# Patient Record
Sex: Female | Born: 1960 | Hispanic: No | Marital: Single | Smoking: Former smoker
Health system: Southern US, Community
[De-identification: ages and names within clinical notes are randomized; demographics above are authoritative.]

## PROBLEM LIST (undated history)

## (undated) DIAGNOSIS — E785 Hyperlipidemia, unspecified: Secondary | ICD-10-CM

## (undated) DIAGNOSIS — J019 Acute sinusitis, unspecified: Secondary | ICD-10-CM

## (undated) DIAGNOSIS — M797 Fibromyalgia: Secondary | ICD-10-CM

## (undated) DIAGNOSIS — E663 Overweight: Secondary | ICD-10-CM

## (undated) DIAGNOSIS — Z8489 Family history of other specified conditions: Secondary | ICD-10-CM

## (undated) DIAGNOSIS — Z8719 Personal history of other diseases of the digestive system: Secondary | ICD-10-CM

## (undated) DIAGNOSIS — A048 Other specified bacterial intestinal infections: Secondary | ICD-10-CM

## (undated) DIAGNOSIS — K219 Gastro-esophageal reflux disease without esophagitis: Secondary | ICD-10-CM

## (undated) DIAGNOSIS — IMO0001 Reserved for inherently not codable concepts without codable children: Secondary | ICD-10-CM

## (undated) DIAGNOSIS — R002 Palpitations: Secondary | ICD-10-CM

## (undated) DIAGNOSIS — E162 Hypoglycemia, unspecified: Secondary | ICD-10-CM

## (undated) DIAGNOSIS — I499 Cardiac arrhythmia, unspecified: Secondary | ICD-10-CM

## (undated) DIAGNOSIS — I729 Aneurysm of unspecified site: Secondary | ICD-10-CM

## (undated) DIAGNOSIS — I10 Essential (primary) hypertension: Secondary | ICD-10-CM

## (undated) DIAGNOSIS — N189 Chronic kidney disease, unspecified: Secondary | ICD-10-CM

## (undated) DIAGNOSIS — D259 Leiomyoma of uterus, unspecified: Secondary | ICD-10-CM

## (undated) DIAGNOSIS — R51 Headache: Secondary | ICD-10-CM

## (undated) DIAGNOSIS — J45909 Unspecified asthma, uncomplicated: Secondary | ICD-10-CM

## (undated) HISTORY — PX: COLONOSCOPY: SHX174

## (undated) HISTORY — DX: Palpitations: R00.2

## (undated) HISTORY — DX: Hypoglycemia, unspecified: E16.2

## (undated) HISTORY — DX: Hyperlipidemia, unspecified: E78.5

## (undated) HISTORY — DX: Fibromyalgia: M79.7

## (undated) HISTORY — PX: LIPOMA EXCISION: SHX5283

## (undated) HISTORY — DX: Overweight: E66.3

## (undated) HISTORY — DX: Essential (primary) hypertension: I10

---
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
## 2006-06-25 ENCOUNTER — Ambulatory Visit: Payer: Self-pay | Admitting: Cardiology

## 2008-05-22 ENCOUNTER — Encounter: Payer: Self-pay | Admitting: Cardiology

## 2008-06-13 ENCOUNTER — Ambulatory Visit: Payer: Self-pay | Admitting: Cardiology

## 2008-06-13 ENCOUNTER — Encounter: Payer: Self-pay | Admitting: Physician Assistant

## 2008-07-25 ENCOUNTER — Encounter: Payer: Self-pay | Admitting: Cardiology

## 2008-07-27 ENCOUNTER — Ambulatory Visit: Payer: Self-pay | Admitting: Cardiology

## 2009-01-05 DIAGNOSIS — E785 Hyperlipidemia, unspecified: Secondary | ICD-10-CM | POA: Insufficient documentation

## 2009-01-05 DIAGNOSIS — R002 Palpitations: Secondary | ICD-10-CM | POA: Insufficient documentation

## 2009-01-05 DIAGNOSIS — R079 Chest pain, unspecified: Secondary | ICD-10-CM | POA: Insufficient documentation

## 2009-01-05 DIAGNOSIS — I10 Essential (primary) hypertension: Secondary | ICD-10-CM | POA: Insufficient documentation

## 2009-01-05 DIAGNOSIS — E663 Overweight: Secondary | ICD-10-CM | POA: Insufficient documentation

## 2009-06-19 ENCOUNTER — Ambulatory Visit: Payer: Self-pay | Admitting: Cardiology

## 2009-06-19 ENCOUNTER — Encounter: Payer: Self-pay | Admitting: Cardiology

## 2009-06-20 ENCOUNTER — Encounter: Payer: Self-pay | Admitting: Cardiology

## 2009-06-23 ENCOUNTER — Encounter: Payer: Self-pay | Admitting: Cardiology

## 2009-06-28 ENCOUNTER — Encounter: Payer: Self-pay | Admitting: Cardiology

## 2009-06-30 ENCOUNTER — Encounter (INDEPENDENT_AMBULATORY_CARE_PROVIDER_SITE_OTHER): Payer: Self-pay | Admitting: *Deleted

## 2009-07-11 ENCOUNTER — Ambulatory Visit: Payer: Self-pay | Admitting: Cardiology

## 2009-07-28 ENCOUNTER — Encounter (INDEPENDENT_AMBULATORY_CARE_PROVIDER_SITE_OTHER): Payer: Self-pay | Admitting: *Deleted

## 2009-08-03 ENCOUNTER — Encounter: Payer: Self-pay | Admitting: Cardiology

## 2010-05-29 NOTE — Letter (Signed)
Summary: Engineer, materials at [facility]  [address]   [place], [place] [address]   Phone: [phone]  Fax: [phone]        June 30, 2009 MRN: [id number]   Quanika HART-LOWE [address], [place]  [address]   Dear [name],  Your test ordered by Selena Batten has been reviewed by your physician (or physician assistant) and was found to be normal or stable. Your physician (or physician assistant) felt no changes were needed at this time.  ____ Echocardiogram  __X__ Cardiac Stress Test  ____ Lab Work  ____ Peripheral vascular study of arms, legs or neck  ____ CT scan or X-ray  ____ Lung or Breathing test  ____ Other:   Thank you.   Hoover Brunette, LPN    Duane Boston, M.D., F.A.C.C. Thressa Sheller, M.D., F.A.C.C. Oneal Grout, M.D., F.A.C.C. Cheree Ditto, M.D., F.A.C.C. Daiva Nakayama, M.D., F.A.C.C. Kenney Houseman, M.D., F.A.C.C. Jeanne Ivan, PA-C

## 2010-05-29 NOTE — Procedures (Signed)
Summary: Holter and Event/ [facility]  Holter and Event/ [facility]   Imported By: Dorise Hiss 07/10/2009 14:31:12  _____________________________________________________________________  External Attachment:    Type:   Image     Comment:   External Document

## 2010-05-29 NOTE — Letter (Signed)
Summary: [facility] D/C DR. DHRUV VYAS  [facility] D[facility]C DR. DHRUV VYAS   Imported By: Zachary George 07/11/2009 14:01:16  _____________________________________________________________________  External Attachment:    Type:   Image     Comment:   External Document

## 2010-05-29 NOTE — Letter (Signed)
Summary: Generic Engineer, agricultural at [facility] [address]   [place], [place] [address]   Phone: [phone]  Fax: [phone]        July 28, 2009 MRN: [id number]    [name] [name] [address], [place]  [address]    Dear Ms. [name],   We ordered lab work to check your kidney function and electrolytes to be done around March 22nd. It does not appear this has been done yet. Please, take the enclosed order to the [facility] to have this lab work done at your earliest convenience.   If you had your primary MD complete this lab work or do not plan to have it done, please notify our office so that we can properly document this in your chart.     Sincerely,  Cyril Loosen, RN, BSN  This letter has been electronically signed by your physician.

## 2010-05-29 NOTE — Consult Note (Signed)
Summary: CARDIOLOGY CONSULT/ [facility]  CARDIOLOGY CONSULT/ [facility]   Imported By: Zachary George 07/11/2009 13:56:48  _____________________________________________________________________  External Attachment:    Type:   Image     Comment:   External Document

## 2010-05-29 NOTE — Assessment & Plan Note (Signed)
Summary: eph-[facility] dsch 2/22   Visit Type:  hospital follow-up Primary Provider:  Sherril Croon  CC:  hospital follow-up visit.  History of Present Illness: the patient is a 50 year old female is hospitalized with left arm numbness and transient associated left-sided facial numbness and subsequent atypical left thoracic discomfort.  The patient underwent a Myoview study as an outpatient study within normal limits there was no scar or ischemia.  Ejection fraction 79%.  The patient also had a 2-D echo showed normal LV function with some diastolic dysfunction.  The patient also reports headaches usually around her periods.  She denies any recurrent substernal chest pain.  She has no chills of breath orthopnea PND.  She does have some edema on exam.  Preventive Screening-Counseling & Management  Alcohol-Tobacco     Smoking Status: quit     Year Quit: 2005  Current Medications (verified): 1)  Metoprolol Tartrate 50 Mg Tabs (Metoprolol Tartrate) .... Take 1 Tablet By Mouth Two Times A Day 2)  Dilt-Cd 180 Mg Xr24h-Cap (Diltiazem Hcl Coated Beads) .... Take 1 Tablet By Mouth Once A Day 3)  Protonix 40 Mg Tbec (Pantoprazole Sodium) .... Take 1 Tablet By Mouth Once A Day 4)  Hydromet 5-1.5 Mg/70ml Syrp (Hydrocodone-Homatropine) .... Take 1 Tablet By Mouth Three Times A Day As Needed 5)  Spironolactone-Hctz 25-25 Mg Tabs (Spironolactone-Hctz) .... Take 1 Tablet By Mouth Once A Day  Allergies (verified): 1)  ! Ace Inhibitors 2)  ! * Z-Pack  Comments:  Nurse/Medical Assistant: The patient's medications and allergies were reviewed with the patient and were updated in the Medication and Allergy Lists. List reviewed.  Past History:  Past Medical History: Last updated: 01/05/2009 OVERWEIGHT/OBESITY (ICD-278.02) HYPERTENSION, UNSPECIFIED (ICD-401.9) HYPERLIPIDEMIA-MIXED (ICD-272.4) PALPITATIONS (ICD-785.1) CHEST PAIN-UNSPECIFIED (ICD-786.50) Hypoglycemia  Family History: Last updated:  01/05/2009 Family History of Coronary Artery Disease:  Family History of Hypertension:   Social History: Last updated: 01/05/2009 Full Time Single  Tobacco Use - Former.  Alcohol Use - no Drug Use - no  Risk Factors: Smoking Status: quit (07/11/2009)  Review of Systems       The patient complains of headache.  The patient denies fatigue, malaise, fever, weight gain/loss, vision loss, decreased hearing, hoarseness, chest pain, palpitations, shortness of breath, prolonged cough, wheezing, sleep apnea, coughing up blood, abdominal pain, blood in stool, nausea, vomiting, diarrhea, heartburn, incontinence, blood in urine, muscle weakness, joint pain, leg swelling, rash, skin lesions, fainting, dizziness, depression, anxiety, enlarged lymph nodes, easy bruising or bleeding, and environmental allergies.    Vital Signs:  Patient profile:   50 year old female Height:      62 inches Weight:      209 pounds BMI:     38.36 Pulse rate:   73 / minute BP sitting:   114 / 76  (left arm) Cuff size:   large  Vitals Entered By: Carlye Grippe (July 11, 2009 2:31 PM)  Nutrition Counseling: Patient's BMI is greater than 25 and therefore counseled on weight management options. CC: hospital follow-up visit   Physical Exam  Additional Exam:  General: Well-developed, well-nourished in no distress head: Normocephalic and atraumatic eyes PERRLA/EOMI intact, conjunctiva and lids normal nose: No deformity or lesions mouth normal dentition, normal posterior pharynx neck: Supple, no JVD.  No masses, thyromegaly or abnormal cervical nodes lungs: Normal breath sounds bilaterally without wheezing.  Normal percussion heart: regular rate and rhythm with normal S1 and S2, no S3 or S4.  PMI is normal.  No pathological murmurs abdomen:  Normal bowel sounds, abdomen is soft and nontender without masses, organomegaly or hernias noted.  No hepatosplenomegaly musculoskeletal: Back normal, normal gait muscle  strength and tone normal pulsus: Pulse is normal in all 4 extremities Extremities: No peripheral pitting edema neurologic: Alert and oriented x 3 skin: Intact without lesions or rashes cervical nodes: No significant adenopathy psychologic: Normal affect    Impression & Recommendations:  Problem # 1:  HYPERTENSION, UNSPECIFIED (ICD-401.9)  switch the patient's hydrochlorothiazide to Aldactazide 25+25 mg p.o. daily.  We have discontinued potassium supplements.  The following medications were removed from the medication list:    Hydrochlorothiazide 25 Mg Tabs (Hydrochlorothiazide) .[name]... Take 1 tablet by mouth once a day Her updated medication list for this problem includes:    Metoprolol Tartrate 50 Mg Tabs (Metoprolol tartrate) .[name]... Take 1 tablet by mouth two times a day    Dilt-cd 180 Mg Xr24h-cap (Diltiazem hcl coated beads) .[name]... Take 1 tablet by mouth once a day    Spironolactone-hctz 25-25 Mg Tabs (Spironolactone-hctz) .[name]... Take 1 tablet by mouth once a day  Problem # 2:  HYPERLIPIDEMIA-MIXED (ICD-272.4) continue risk factor modification.  I gave the patient dietary recommendations.  Problem # 3:  CHEST PAIN-UNSPECIFIED (ICD-786.50) negative workup for ischemia.  Cardiolite study was within the limits. Her updated medication list for this problem includes:    Metoprolol Tartrate 50 Mg Tabs (Metoprolol tartrate) .[name]... Take 1 tablet by mouth two times a day    Dilt-cd 180 Mg Xr24h-cap (Diltiazem hcl coated beads) .[name]... Take 1 tablet by mouth once a day  Other Orders: Future Orders: T-Basic Metabolic Panel [id number]) ... 07/18/2009  Patient Instructions: 1)  Start Aldactazide 25/25mg  daily 2)  Stop K-Dur 3)  Stop HCTZ 4)  Labs in one week. 5)  Follow up in  1 year Prescriptions: SPIRONOLACTONE-HCTZ 25-25 MG TABS (SPIRONOLACTONE-HCTZ) Take 1 tablet by mouth once a day  #30 x 6   Entered by:   Hoover Brunette, LPN   Authorized by:   Lewayne Bunting, MD, [facility]   Signed by:    Hoover Brunette, LPN on 91/47/8295   Method used:   Electronically to        [facility]  [address] (retail)       [address]       [place], [place]  [address]       Ph: [phone]       Fax: [phone]   RxID:   [id number]   Handout requested.

## 2010-08-06 ENCOUNTER — Other Ambulatory Visit: Payer: Self-pay | Admitting: Cardiology

## 2010-08-31 ENCOUNTER — Other Ambulatory Visit: Payer: Self-pay | Admitting: Cardiology

## 2010-09-11 NOTE — Assessment & Plan Note (Signed)
[facility]                          [facility] OFF[facility] NOTE   [name] [name], DUTY                MRN:          [id number]  DATE:07/27/2008                            DOB:          Dec 31, 1960    PRIMARY CARDIOLOGIST:  Learta Codding, MD, [facility]   REASON FOR VISIT:  Scheduled followup.   Please refer to my recent office note of June 13, 2008, for complete  details.   At that time, the patient presented for evaluation of palpitations and I  referred her for CardioNet monitoring.  This was completed and reviewed  by me, revealing normal sinus rhythm with occasional sinus tachycardia  with rates as high as 120 bpm.  The patient herself reports 3 discrete  episodes of palpitations, all occurring during the middle of the night,  and with an associated systolic blood pressure of approximately 150.  In  each of the 3 cases, she took half a tablet of her metoprolol/HCTZ, with  resolution of her palpitations in 30-60 minutes.  The results of the  monitor were reviewed with the patient.   In general, [name] suggests that she has been plagued with this  problem of palpitations over the last year or so.  She denies any undue  stress.  As previously noted, a TSH level in January of this year was  normal.  Potassium at that time was 3.3.   CURRENT MEDICATIONS:  1. Diltiazem CD 120 daily.  2. Metoprolol and HCTZ 100/25 mg daily.  3. KCl 10 daily.  4. Protonix.   PHYSICAL EXAMINATION:  VITAL SIGNS:  Blood pressure 123/81, pulse 84 and  regular, and weight 213.  GENERAL:  A 50 year old female, moderately obese, sitting upright, no  distress.  HEENT:  Normocephalic and atraumatic.  NECK:  Palpable carotid pulse without bruits; no JVD.  LUNGS:  Clear to auscultation in all fields.  HEART:  Regular rate and rhythm.  No significant murmurs.  ABDOMEN:  Protuberant, nontender.  EXTREMITIES:  No pedal edema.  A hard, raised nodular lesion on  the  medial aspect of the right foot.  No ecchymosis.  NEUROLOGIC:  No focal deficit.   IMPRESSION:  1. Tachy palpitations.      a.     Documented sinus tachycardia by CardioNet monitoring.      b.     History of normal left ventricular function.  2. History of atypical chest pain.      a.     Negative dobutamine stress echocardiogram, February 2008.  3. Hypertension.  4. Dyslipidemia.  5. Hypoglycemia.  6. Obesity.   PLAN:  1. Complete metabolic profile for reassessment of potassium level.  2. Recommendation is to dissociate the metoprolol from the current      combination medication, so as to better manage her palpitations      with a pure beta-blocker.  When she completes her current supply of      metoprolol/HCTZ, I will start her on metoprolol 100 mg q.a.m. and      50 mg q.p.m., the latter dose to provide some coverage during the  early morning hours.  She is to otherwise continue on      hydrochlorothiazide 25 mg daily.  3. Schedule return clinic followup with myself and Dr. Andee Lineman in 3      months.      Gene Serpe, PA-C  Electronically Signed      Learta Codding, MD,FACC  Electronically Signed   GS/MedQ  DD: 07/27/2008  DT: 07/28/2008  Job #: [id number]   cc:   [name] Beam, MD

## 2010-09-11 NOTE — Assessment & Plan Note (Signed)
[facility]                          [facility] NOTE   Laikyn, Gewirtz [name] [name]                MRN:          [id number]  DATE:06/13/2008                            DOB:          29-Apr-1961    PRIMARY CARDIOLOGIST:  Learta Codding, MD, [facility]   REASON FOR VISIT:  [name] [name] is a very pleasant 50 year old female, whom  we saw once before in consultation here at [facility] in February 2008.  At that time, she was referred to [place] for evaluation of chest pain, which  we felt to be quite atypical and, in fact, secondary to a  musculoskeletal etiology.  Given that she presented with several cardiac  risk factors, however, we did refer her for an ischemic workup.  She  underwent a stress echocardiogram, reviewed by Dr. Andee Lineman, which was  essentially normal.   The patient is now referred to [place] for evaluation of recent development  of palpitations.  These began approximately a little over a year ago.  They occur perhaps once a week, and usually at night.  They can last as  long as an hour.  There is no associated shortness of breath or  presyncope, nor any chest pain.  It is more the case that the patient is  exclusively aware of these, and it clearly induces some anxiety.   The patient drinks only decaffeinated beverages.  She reports having had  some recent blood work, including a TSH level, which was reportedly  normal.  She has not smoked in 6 years.   EKG in our office today indicates NSR, 75 bpm with normal axis, and no  ischemic changes.   REVIEW OF SYSTEMS:  The patient denies any exertional angina pectoris or  significant dyspnea.   CURRENT MEDICATIONS:  1. Metoprolol/hydrochlorothiazide 100/25 mg daily.  2. KCl 10 daily.  3. Diltiazem CD 120 daily.  4. Protonix 40 daily.   PHYSICAL EXAMINATION:  VITAL SIGNS:  Blood pressure 115/80, pulse 77 and  regular.  GENERAL:  A 50 year old female, moderately obese, sitting upright, no  distress.  HEENT:  Normocephalic and atraumatic.  NECK:  Palpable carotid pulses without bruits; unable to assess JVD,  secondary to neck girth.  LUNGS:  Clear to auscultation bilaterally.  HEART:  Regular rate and rhythm.  No significant murmurs.  ABDOMEN:  Protuberant and nontender.  EXTREMITIES:  No edema.  NEUROLOGIC:  No focal deficit.   IMPRESSION:  1. Palpitations.      a.     History of normal left ventricular function.  2. History of atypical chest pain.      a.     Negative dobutamine stress echocardiogram, February 2008.  3. Hypertension.  4. Dyslipidemia.  5. Obesity.   PLAN:  1. Schedule CardioNet monitoring for further evaluation of      palpitations, and exclusion of any significant underlying      dysrhythmia.  2. Schedule return clinic followup with myself and Dr. Andee Lineman in the      following 4-6 weeks, for review of monitor results and further      recommendations.  3. The patient is to  continue on a low-caffeinated regimen.      Rozell Searing, PA-C  Electronically Signed      Jonelle Sidle, MD  Electronically Signed   GS/MedQ  DD: 06/13/2008  DT: 06/14/2008  Job #: [id number]   cc:   Doreen Beam, MD

## 2010-09-28 ENCOUNTER — Other Ambulatory Visit: Payer: Self-pay | Admitting: *Deleted

## 2010-09-28 MED ORDER — SPIRONOLACTONE-HCTZ 25-25 MG PO TABS: 1.0000 | ORAL_TABLET | Freq: Every day | ORAL | Status: DC

## 2010-12-28 ENCOUNTER — Encounter: Payer: Self-pay | Admitting: Cardiology

## 2011-01-01 ENCOUNTER — Encounter: Payer: Self-pay | Admitting: Cardiology

## 2011-01-01 ENCOUNTER — Ambulatory Visit (INDEPENDENT_AMBULATORY_CARE_PROVIDER_SITE_OTHER): Payer: PRIVATE HEALTH INSURANCE | Admitting: Cardiology

## 2011-01-01 VITALS — BP 110/77 | HR 68 | Ht 63.0 in | Wt 201.5 lb

## 2011-01-01 DIAGNOSIS — R079 Chest pain, unspecified: Secondary | ICD-10-CM

## 2011-01-01 DIAGNOSIS — I10 Essential (primary) hypertension: Secondary | ICD-10-CM

## 2011-01-01 MED ORDER — SPIRONOLACTONE-HCTZ 25-25 MG PO TABS: 1.0000 | ORAL_TABLET | Freq: Every day | ORAL | Status: DC

## 2011-01-01 NOTE — Patient Instructions (Signed)
Continue all current medications. Your physician wants you to follow up in:  1 year.  You will receive a reminder letter in the mail one-two months in advance.  If you don't receive a letter, please call our office to schedule the follow up appointment   

## 2011-01-01 NOTE — Assessment & Plan Note (Signed)
Resolved no further ischemia workup is needed. Patient appears to have costochondritis.

## 2011-01-01 NOTE — [facility] Notes (Signed)
HPI The patient is a very pleasant 50 year old female with a prior history of left-sided facial numbness and atypical left thoracic chest discomfort. The patient has been diagnosed with costochondritis. 2 years ago she had a stress test done which was within normal limits and by myocardial perfusion imaging showed an ejection fraction of 79%. The patient also the 2-D echocardiogram which showed normal LV function with some diastolic dysfunction. The patient is complaining of some sinus headache but reports no shortness of breath. Chest some mild chest tenderness on palpation. She denies any orthopnea PND palpitations or other cardiovascular-related symptoms. 12-lead electrocardiogram shows normal sinus rhythm no acute ischemic changes Bedside echocardiogram was performed which showed normal LV function and no other structural abnormalities and normal valve apparatus  Allergies  Allergen Reactions  . Zithromax (Azithromycin) Anaphylaxis  . Ace Inhibitors     REACTION: swelling  . Medrol (Methylprednisolone)     Heart racing, increased BP  . Tetracyclines & Related     Heart racing, increased bp    Current Outpatient Prescriptions on File Prior to Visit  Medication Sig Dispense Refill  . diltiazem (CARDIZEM CD) 180 MG 24 hr capsule Take 180 mg by mouth daily.        . metoprolol (LOPRESSOR) 50 MG tablet Take 50 mg by mouth 2 (two) times daily.          Past Medical History  Diagnosis Date  . Overweight     Obesity  . Hypertension   . Hyperlipidemia   . Palpitations   . Chest pain   . Hypoglycemia     No past surgical history on file.  Family History  Problem Relation Age of Onset  . Hypertension Other   . Coronary artery disease Other     History   Social History  . Marital Status: Divorced    Spouse Name: N/A    Number of Children: N/A  . Years of Education: N/A   Occupational History  . Full time [employer]   Social History Main Topics  . Smoking status:  Former Smoker -- 0.8 packs/day for 15 years    Types: Cigarettes    Quit date: 04/28/2004  . Smokeless tobacco: Never Used  . Alcohol Use: No  . Drug Use: No  . Sexually Active: Not on file   Other Topics Concern  . Not on file   Social History Narrative   Single   NGE:XBMWUXLKG positives as outlined above. The remainder of the 18  point review of systems is negative  PHYSICAL EXAM BP 110/77  Pulse 68  Ht 5\' 3"  (1.6 m)  Wt 201 lb 8 oz (91.4 kg)  BMI 35.69 kg/m2  General: Well-developed, well-nourished in no distress Head: Normocephalic and atraumatic Eyes:PERRLA/EOMI intact, conjunctiva and lids normal Ears: No deformity or lesions Mouth:normal dentition, normal posterior pharynx Neck: Supple, no JVD.  No masses, thyromegaly or abnormal cervical nodes Lungs: Normal breath sounds bilaterally without wheezing.  Normal percussion Cardiac: regular rate and rhythm with normal S1 and S2, no S3 or S4.  PMI is normal.  No pathological murmurs Abdomen: Normal bowel sounds, abdomen is soft and nontender without masses, organomegaly or hernias noted.  No hepatosplenomegaly MSK: Back normal, normal gait muscle strength and tone normal Vascular: Pulse is normal in all 4 extremities Extremities: No peripheral pitting edema Neurologic: Alert and oriented x 3 Skin: Intact without lesions or rashes Lymphatics: No significant adenopathy Psychologic: Normal affect   ECG: Normal sinus rhythm. No acute abnormalities  ASSESSMENT AND PLAN

## 2011-01-01 NOTE — Assessment & Plan Note (Signed)
Blood pressure is well controlled. The patient was given a refill on hydrochlorothiazide/spironolactone

## 2011-10-25 ENCOUNTER — Telehealth: Payer: Self-pay | Admitting: Cardiology

## 2011-10-25 NOTE — Telephone Encounter (Signed)
Neurologist prescribed Amitriptyline 25 mg for migraines.  One of the side effects is symptoms of tachycardia.  Patient has not taken this wants to check with Dr. Andee Lineman before starting.   Patient will be at work today until 3pm, have her paged if trying to reach.  After 3 call cell number listed.

## 2011-10-25 NOTE — Telephone Encounter (Signed)
See below

## 2011-10-27 NOTE — Telephone Encounter (Signed)
Low dose 25 mg should be fine

## 2011-10-28 NOTE — Telephone Encounter (Signed)
Patient notified of below.   

## 2011-10-28 NOTE — Telephone Encounter (Signed)
Left message to return call 

## 2011-12-02 DIAGNOSIS — M79609 Pain in unspecified limb: Secondary | ICD-10-CM

## 2011-12-11 ENCOUNTER — Encounter: Payer: Self-pay | Admitting: Cardiology

## 2011-12-11 ENCOUNTER — Ambulatory Visit (INDEPENDENT_AMBULATORY_CARE_PROVIDER_SITE_OTHER): Payer: PRIVATE HEALTH INSURANCE | Admitting: Cardiology

## 2011-12-11 VITALS — BP 106/72 | HR 75 | Ht 63.0 in | Wt 206.4 lb

## 2011-12-11 DIAGNOSIS — E785 Hyperlipidemia, unspecified: Secondary | ICD-10-CM

## 2011-12-11 DIAGNOSIS — R079 Chest pain, unspecified: Secondary | ICD-10-CM

## 2011-12-11 DIAGNOSIS — R002 Palpitations: Secondary | ICD-10-CM

## 2011-12-11 NOTE — Assessment & Plan Note (Signed)
No recurrent palpitations no further workup needed.

## 2011-12-11 NOTE — Assessment & Plan Note (Signed)
Followup with primary care physician. Stable

## 2011-12-11 NOTE — Assessment & Plan Note (Signed)
No recurrent chest pain. No further ischemia workup

## 2011-12-11 NOTE — [facility] Notes (Signed)
[name] Bottoms, MD, [facility] Board Certified in Adult Cardiovascular Medicine,Internal Medicine and Critical Care Medicine    CC:   Followup patient history of palpitations                                                                               HPI:        Patient is doing well. She reports no recurrent palpitations. She has no cardiovascular complaints. She reports no orthopnea PND palpitations or syncope. She was recently treated for diverticulitis in the emergency room. She was dehydrated and developed renal insufficiency on ciprofloxacin and Flagyl. She also been diagnosed with a 5 mm ICA aneurysm which is followed closely. From a cardiovascular standpoint the patient is stable and needs no further followup   PMH: reviewed and listed in Problem List in Electronic Records (and see below) Past Medical History  Diagnosis Date  . Overweight     Obesity  . Hypertension   . Hyperlipidemia   . Palpitations   . Chest pain   . Hypoglycemia    No past surgical history on file.  Allergies/SH/FHX : available in Electronic Records for review  Allergies  Allergen Reactions  . Zithromax (Azithromycin) Anaphylaxis  . Ace Inhibitors     REACTION: swelling  . Ciprofloxacin     N&V, and kidney failure   . Medrol (Methylprednisolone)     Heart racing, increased BP  . Tetracyclines & Related     Heart racing, increased bp   History   Social History  . Marital Status: Divorced    Spouse Name: N/A    Number of Children: N/A  . Years of Education: N/A   Occupational History  . Full time [employer]   Social History Main Topics  . Smoking status: Former Smoker -- 0.8 packs/day for 15 years    Types: Cigarettes    Quit date: 04/28/2004  . Smokeless tobacco: Never Used  . Alcohol Use: No  . Drug Use: No  . Sexually Active: Not on file   Other Topics Concern  . Not on file   Social History Narrative   Single   Family History  Problem Relation Age of Onset  .  Hypertension Other   . Coronary artery disease Other     Medications: Current Outpatient Prescriptions  Medication Sig Dispense Refill  . Cholecalciferol (D 1000) 1000 UNITS tablet Take 1,000 Units by mouth daily.        [name] diltiazem (CARDIZEM CD) 180 MG 24 hr capsule Take 180 mg by mouth daily.        . fluticasone (FLONASE) 50 MCG/ACT nasal spray Place 2 sprays into the nose daily.        [name] loratadine (CLARITIN) 10 MG tablet Take 10 mg by mouth daily.        . metoprolol (LOPRESSOR) 50 MG tablet Take 50 mg by mouth 2 (two) times daily.        [name] omeprazole (PRILOSEC) 40 MG capsule Take 40 mg by mouth daily.          ROS: No nausea or vomiting. No fever or chills.No melena or hematochezia.No bleeding.No claudication  Physical Exam: BP 106/72  Pulse 75  Ht 5\' 3"  (1.6 m)  Wt 206 lb 6.4 oz (93.622 kg)  BMI 36.56 kg/m2  SpO2 98% General: Well-nourished African American female in no distress Neck: Normal carotid upstroke no carotid bruits Lungs: Clear breath sounds bilaterally no wheezing. Cardiac: Regular in rhythm with normal S1-S2 no murmurs or gallops Vascular: No edema. Normal distal pulses Skin: Warm and dry Physcologic: Normal affect  12lead ECG: Not obtained Limited bedside ECHO:N/A No images are attached to the encounter.   I reviewed and summarized the old records. I reviewed ECG and prior blood work.  Assessment and Plan  CHEST PAIN-UNSPECIFIED No recurrent chest pain. No further ischemia workup  HYPERLIPIDEMIA-MIXED Followup with primary care physician. Stable  PALPITATIONS No recurrent palpitations no further workup needed.    Patient Active Problem List  Diagnosis  . HYPERLIPIDEMIA-MIXED  . OVERWEIGHT/OBESITY  . HYPERTENSION, UNSPECIFIED  . PALPITATIONS  . CHEST PAIN-UNSPECIFIED

## 2011-12-11 NOTE — Patient Instructions (Addendum)
Your physician recommends that you schedule a follow-up appointment as needed.   Your physician recommends that you continue on your current medications as directed. Please refer to the Current Medication list given to you today.  

## 2012-05-18 ENCOUNTER — Other Ambulatory Visit: Payer: Self-pay | Admitting: Neurosurgery

## 2012-05-25 ENCOUNTER — Other Ambulatory Visit: Payer: Self-pay | Admitting: Neurosurgery

## 2012-05-25 DIAGNOSIS — I729 Aneurysm of unspecified site: Secondary | ICD-10-CM

## 2012-06-12 ENCOUNTER — Encounter (HOSPITAL_COMMUNITY): Payer: Self-pay | Admitting: Pharmacy Technician

## 2012-06-17 ENCOUNTER — Other Ambulatory Visit (HOSPITAL_COMMUNITY): Payer: PRIVATE HEALTH INSURANCE

## 2012-06-18 ENCOUNTER — Encounter (HOSPITAL_COMMUNITY)
Admission: RE | Admit: 2012-06-18 | Discharge: 2012-06-18 | Disposition: A | Discharge status: Home / Self Care | Payer: BC Managed Care – PPO | Source: Ambulatory Visit | Attending: Neurosurgery | Admitting: Neurosurgery

## 2012-06-18 ENCOUNTER — Ambulatory Visit (HOSPITAL_COMMUNITY)
Admission: RE | Admit: 2012-06-18 | Discharge: 2012-06-18 | Disposition: A | Discharge status: Home / Self Care | Payer: BC Managed Care – PPO | Source: Ambulatory Visit | Attending: Neurosurgery | Admitting: Neurosurgery

## 2012-06-18 ENCOUNTER — Encounter (HOSPITAL_COMMUNITY): Payer: Self-pay

## 2012-06-18 DIAGNOSIS — R9431 Abnormal electrocardiogram [ECG] [EKG]: Secondary | ICD-10-CM | POA: Insufficient documentation

## 2012-06-18 DIAGNOSIS — Z01810 Encounter for preprocedural cardiovascular examination: Secondary | ICD-10-CM | POA: Insufficient documentation

## 2012-06-18 DIAGNOSIS — Z01812 Encounter for preprocedural laboratory examination: Secondary | ICD-10-CM | POA: Insufficient documentation

## 2012-06-18 DIAGNOSIS — Z01818 Encounter for other preprocedural examination: Secondary | ICD-10-CM | POA: Insufficient documentation

## 2012-06-18 HISTORY — DX: Personal history of other diseases of the digestive system: Z87.19

## 2012-06-18 HISTORY — DX: Acute sinusitis, unspecified: J01.90

## 2012-06-18 HISTORY — DX: Leiomyoma of uterus, unspecified: D25.9

## 2012-06-18 HISTORY — DX: Gastro-esophageal reflux disease without esophagitis: K21.9

## 2012-06-18 HISTORY — DX: Family history of other specified conditions: Z84.89

## 2012-06-18 HISTORY — DX: Other specified bacterial intestinal infections: A04.8

## 2012-06-18 HISTORY — DX: Chronic kidney disease, unspecified: N18.9

## 2012-06-18 HISTORY — DX: Headache: R51

## 2012-06-18 HISTORY — DX: Reserved for inherently not codable concepts without codable children: IMO0001

## 2012-06-18 HISTORY — DX: Aneurysm of unspecified site: I72.9

## 2012-06-18 LAB — BASIC METABOLIC PANEL
BUN: 10 mg/dL (ref 6–23)
Chloride: 104 mEq/L (ref 96–112)
Creatinine, Ser: 0.93 mg/dL (ref 0.50–1.10)
GFR calc Af Amer: 81 mL/min — ABNORMAL LOW (ref >90)
GFR calc non Af Amer: 70 mL/min — ABNORMAL LOW (ref >90)
Potassium: 3.3 mEq/L — ABNORMAL LOW (ref 3.5–5.1)

## 2012-06-18 LAB — CBC
HCT: 37.5 % (ref 36.0–46.0)
MCHC: 33.9 g/dL (ref 30.0–36.0)
RDW: 13.6 % (ref 11.5–15.5)
WBC: 9.5 10*3/uL (ref 4.0–10.5)

## 2012-06-18 LAB — SURGICAL PCR SCREEN: Staphylococcus aureus: NEGATIVE

## 2012-06-18 LAB — TYPE AND SCREEN
ABO/RH(D): A POS
Antibody Screen: NEGATIVE

## 2012-06-18 NOTE — Progress Notes (Signed)
Call to Dr. Chaney Malling, report included pt. Remarks about family members, brother & niece who "flatlined " during anesth. Request will be made for records of her prior surg. /sedation events at [place].

## 2012-06-18 NOTE — Progress Notes (Addendum)
Call to A. Zelenak,PAC, reported relative's history of "flat lining" with anesthesia  . Pt. Will made effort at getting report fr. Relatives as to what actually took place.

## 2012-06-18 NOTE — Pre-Procedure Instructions (Signed)
[name] [name]  06/18/2012   Your procedure is scheduled on:  06/24/2012  Report to [facility] Stay Center at 6:30 AM.  Call this number if you have problems the morning of surgery: [phone]   Remember:   Do not eat food or drink liquids after midnight. On TUESDAY   Take these medicines the morning of surgery with A SIP OF WATER: METOPROLOL, LORATDINE   Do not wear jewelry, make-up or nail polish.  Do not wear lotions, powders, or perfumes. You may wear deodorant.  Do not shave 48 hours prior to surgery.   Do not bring valuables to the hospital.  Contacts, dentures or bridgework may not be worn into surgery.  Leave suitcase in the car. After surgery it may be brought to your room.  For patients admitted to the hospital, checkout time is 11:00 AM the day of  discharge.   Patients discharged the day of surgery will not be allowed to drive  home.  Name and phone number of your driver: with family  Special Instructions: Shower using CHG 2 nights before surgery and the night before surgery.  If you shower the day of surgery use CHG.  Use special wash - you have one bottle of CHG for all showers.  You should use approximately 1/3 of the bottle for each shower.   Please read over the following fact sheets that you were given: Pain Booklet, Coughing and Deep Breathing, Blood Transfusion Information, MRSA Information and Surgical Site Infection Prevention

## 2012-06-18 NOTE — Progress Notes (Signed)
Call to Dr. Chaney Malling, reviewed records that arrived fr. [place] on previous sedation experiences. Records will be placed in binder.

## 2012-06-18 NOTE — Progress Notes (Signed)
Pt. Last had EKG at [facility], doesn't have tracing with her today, will obtain here in [facility].

## 2012-06-19 NOTE — Progress Notes (Signed)
Pt. Called back to report her niece's experience (of "flat lining")  was related to a medicine that she was given for blood clotting problem, not related to  anesth. Agent.  Her brother's  experience of "flat lining" was related to being given a miscalculated amount of anesthesia.

## 2012-06-22 ENCOUNTER — Encounter (HOSPITAL_COMMUNITY): Payer: Self-pay

## 2012-06-22 NOTE — Progress Notes (Signed)
Anesthesia chart review: Patient is a 52 year old female scheduled for right pterional craniotomy for cerebral aneurysm (ICA) on 06/24/12 by Dr. Franky Macho.  Other history includes former smoker, obesity, GERD, HTN, hiatal hernia, HLD, hypoglycemia, headaches, "left upper pole kidney defect", history of chest pain with normal stress test '11 and no further cardiac follow-up or work-up recommended by Dr. Andee Lineman on 12/11/11.  PCP is Dr. Donzetta Sprung.  She initially reported adverse anesthesia events with two family members.  She was asked to obtain more information, which she did.  According to her PAT RN notes, "Pt. Called back to report her niece's experience (of "flat lining") was related to a medicine that she was given for blood clotting problem, not related to anesth. Agent. Her brother's experience of "flat lining" was related to being given a miscalculated amount of anesthesia."  Nuclear stress test on 06/23/09 [facility]) showed normal uptake in all segments. There is no scar or ischemia. Wall motion is normal. EF 79%.  Echo on 06/20/09 [facility]) showed normal LV chamber size, mild concentric LVH, normal global LV wall motion and contractility, estimated EF greater than 65%. Abnormal left ventricular diastolic filling observed consistent with impaired relaxation. Mildly dilated left atrium. Trace mitral regurgitation. Normal RV systolic function. Mild tricuspid regurgitation. RVSP calculated at 30 mmHg.  EKG on 06/18/12 showed NSR, cannot rule out anterior infarct (age undetermined), nonspecific ST abnormality.  Her r wave is lower in V3 and T wave is negative in III, otherwise not significantly changed since 01/03/11.  Preoperative labs and CXR noted.  She will be evaluated by her assigned anesthesiologist on the day of surgery, but anticipate she can proceed as planned and no acute changes.  Shonna Chock, PA-C 06/22/12 1040

## 2012-06-23 MED ORDER — CEFAZOLIN SODIUM-DEXTROSE 2-3 GM-% IV SOLR
2.0000 g | INTRAVENOUS | Status: AC
  Administered 2012-06-24: 2 g via INTRAVENOUS
  Filled 2012-06-23: qty 50

## 2012-06-24 ENCOUNTER — Encounter (HOSPITAL_COMMUNITY): Payer: Self-pay | Admitting: *Deleted

## 2012-06-24 ENCOUNTER — Encounter (HOSPITAL_COMMUNITY)
Admission: RE | Disposition: A | Discharge status: Home / Self Care | Payer: Self-pay | Source: Ambulatory Visit | Attending: Neurosurgery

## 2012-06-24 ENCOUNTER — Ambulatory Visit (HOSPITAL_COMMUNITY): Payer: BC Managed Care – PPO

## 2012-06-24 ENCOUNTER — Ambulatory Visit (HOSPITAL_COMMUNITY): Payer: BC Managed Care – PPO | Admitting: Certified Registered"

## 2012-06-24 ENCOUNTER — Inpatient Hospital Stay (HOSPITAL_COMMUNITY)
Admission: RE | Admit: 2012-06-24 | Discharge: 2012-06-30 | DRG: 002 | Disposition: A | Discharge status: Home / Self Care | Payer: BC Managed Care – PPO | Source: Ambulatory Visit | Attending: Neurosurgery | Admitting: Neurosurgery

## 2012-06-24 ENCOUNTER — Encounter (HOSPITAL_COMMUNITY): Payer: Self-pay | Admitting: Vascular Surgery

## 2012-06-24 DIAGNOSIS — Z8719 Personal history of other diseases of the digestive system: Secondary | ICD-10-CM

## 2012-06-24 DIAGNOSIS — Z888 Allergy status to other drugs, medicaments and biological substances status: Secondary | ICD-10-CM

## 2012-06-24 DIAGNOSIS — R51 Headache: Secondary | ICD-10-CM | POA: Diagnosis present

## 2012-06-24 DIAGNOSIS — M47812 Spondylosis without myelopathy or radiculopathy, cervical region: Secondary | ICD-10-CM | POA: Diagnosis present

## 2012-06-24 DIAGNOSIS — K449 Diaphragmatic hernia without obstruction or gangrene: Secondary | ICD-10-CM | POA: Diagnosis present

## 2012-06-24 DIAGNOSIS — E7800 Pure hypercholesterolemia, unspecified: Secondary | ICD-10-CM | POA: Diagnosis present

## 2012-06-24 DIAGNOSIS — K219 Gastro-esophageal reflux disease without esophagitis: Secondary | ICD-10-CM | POA: Diagnosis present

## 2012-06-24 DIAGNOSIS — H05229 Edema of unspecified orbit: Secondary | ICD-10-CM | POA: Diagnosis present

## 2012-06-24 DIAGNOSIS — Z87891 Personal history of nicotine dependence: Secondary | ICD-10-CM

## 2012-06-24 DIAGNOSIS — R911 Solitary pulmonary nodule: Secondary | ICD-10-CM | POA: Diagnosis present

## 2012-06-24 DIAGNOSIS — I671 Cerebral aneurysm, nonruptured: Principal | ICD-10-CM | POA: Diagnosis present

## 2012-06-24 DIAGNOSIS — I10 Essential (primary) hypertension: Secondary | ICD-10-CM | POA: Diagnosis present

## 2012-06-24 DIAGNOSIS — Z79899 Other long term (current) drug therapy: Secondary | ICD-10-CM

## 2012-06-24 DIAGNOSIS — Z881 Allergy status to other antibiotic agents status: Secondary | ICD-10-CM

## 2012-06-24 HISTORY — PX: CRANIOTOMY: SHX93

## 2012-06-24 SURGERY — CRANIOTOMY INTRACRANIAL ANEURYSM FOR CAROTID
Anesthesia: General | Site: Head | Laterality: Right | Wound class: Clean

## 2012-06-24 MED ORDER — MAGNESIUM CITRATE PO SOLN
1.0000 | Freq: Once | ORAL | Status: AC | PRN
  Filled 2012-06-24: qty 296

## 2012-06-24 MED ORDER — PANTOPRAZOLE SODIUM 40 MG IV SOLR
40.0000 mg | Freq: Every day | INTRAVENOUS | Status: DC
  Administered 2012-06-24: 40 mg via INTRAVENOUS
  Filled 2012-06-24: qty 40

## 2012-06-24 MED ORDER — DEXAMETHASONE SODIUM PHOSPHATE 4 MG/ML IJ SOLN
4.0000 mg | Freq: Four times a day (QID) | INTRAMUSCULAR | Status: DC
  Filled 2012-06-24 (×3): qty 1

## 2012-06-24 MED ORDER — ONDANSETRON HCL 4 MG/2ML IJ SOLN
INTRAMUSCULAR | Status: DC | PRN
  Administered 2012-06-24: 4 mg via INTRAVENOUS

## 2012-06-24 MED ORDER — 0.9 % SODIUM CHLORIDE (POUR BTL) OPTIME
TOPICAL | Status: DC | PRN
  Administered 2012-06-24 (×2): 1000 mL

## 2012-06-24 MED ORDER — AMOXICILLIN-POT CLAVULANATE 875-125 MG PO TABS
1.0000 | ORAL_TABLET | Freq: Two times a day (BID) | ORAL | Status: DC
  Administered 2012-06-25 – 2012-06-30 (×11): 1 via ORAL
  Filled 2012-06-24 (×13): qty 1

## 2012-06-24 MED ORDER — LORATADINE 10 MG PO TABS
10.0000 mg | ORAL_TABLET | Freq: Every day | ORAL | Status: DC
  Administered 2012-06-25 – 2012-06-30 (×6): 10 mg via ORAL
  Filled 2012-06-24 (×7): qty 1

## 2012-06-24 MED ORDER — FENTANYL CITRATE 0.05 MG/ML IJ SOLN
INTRAMUSCULAR | Status: DC | PRN
  Administered 2012-06-24 (×2): 50 ug via INTRAVENOUS
  Administered 2012-06-24: 350 ug via INTRAVENOUS
  Administered 2012-06-24 (×3): 50 ug via INTRAVENOUS

## 2012-06-24 MED ORDER — LIDOCAINE HCL 4 % MT SOLN
OROMUCOSAL | Status: DC | PRN
  Administered 2012-06-24: 4 mL via TOPICAL

## 2012-06-24 MED ORDER — ACETAMINOPHEN 10 MG/ML IV SOLN
1000.0000 mg | Freq: Four times a day (QID) | INTRAVENOUS | Status: DC
  Administered 2012-06-24 – 2012-06-25 (×3): 1000 mg via INTRAVENOUS
  Filled 2012-06-24 (×4): qty 100

## 2012-06-24 MED ORDER — SODIUM CHLORIDE 0.9 % IV SOLN
500.0000 mg | INTRAVENOUS | Status: DC | PRN
  Administered 2012-06-24: 500 mg via INTRAVENOUS

## 2012-06-24 MED ORDER — LIDOCAINE HCL (CARDIAC) 20 MG/ML IV SOLN
INTRAVENOUS | Status: DC | PRN
  Administered 2012-06-24: 100 mg via INTRAVENOUS

## 2012-06-24 MED ORDER — SODIUM CHLORIDE 0.9 % IV SOLN
500.0000 mg | INTRAVENOUS | Status: DC
  Filled 2012-06-24: qty 5

## 2012-06-24 MED ORDER — VITAMIN D3 25 MCG (1000 UNIT) PO TABS
1000.0000 [IU] | ORAL_TABLET | Freq: Every day | ORAL | Status: DC
  Administered 2012-06-25 – 2012-06-30 (×6): 1000 [IU] via ORAL
  Filled 2012-06-24 (×6): qty 1

## 2012-06-24 MED ORDER — METOPROLOL TARTRATE 50 MG PO TABS
50.0000 mg | ORAL_TABLET | Freq: Two times a day (BID) | ORAL | Status: DC
  Administered 2012-06-24 – 2012-06-30 (×12): 50 mg via ORAL
  Filled 2012-06-24 (×13): qty 1

## 2012-06-24 MED ORDER — SODIUM CHLORIDE 0.9 % IV SOLN
500.0000 mg | Freq: Two times a day (BID) | INTRAVENOUS | Status: DC
  Administered 2012-06-24: 500 mg via INTRAVENOUS
  Filled 2012-06-24 (×3): qty 5

## 2012-06-24 MED ORDER — DILTIAZEM HCL ER COATED BEADS 180 MG PO CP24
180.0000 mg | ORAL_CAPSULE | Freq: Every day | ORAL | Status: DC
  Administered 2012-06-24 – 2012-06-29 (×5): 180 mg via ORAL
  Filled 2012-06-24 (×7): qty 1

## 2012-06-24 MED ORDER — DEXAMETHASONE SODIUM PHOSPHATE 4 MG/ML IJ SOLN: 4.0000 mg | Freq: Three times a day (TID) | INTRAMUSCULAR | Status: DC

## 2012-06-24 MED ORDER — ONDANSETRON HCL 4 MG/2ML IJ SOLN
4.0000 mg | INTRAMUSCULAR | Status: DC | PRN
  Administered 2012-06-25: 4 mg via INTRAVENOUS
  Filled 2012-06-24: qty 2

## 2012-06-24 MED ORDER — OXYCODONE HCL 5 MG PO TABS
5.0000 mg | ORAL_TABLET | ORAL | Status: DC | PRN
  Administered 2012-06-24 – 2012-06-26 (×3): 5 mg via ORAL
  Administered 2012-06-26 – 2012-06-28 (×7): 10 mg via ORAL
  Administered 2012-06-28: 5 mg via ORAL
  Administered 2012-06-29 – 2012-06-30 (×5): 10 mg via ORAL
  Filled 2012-06-24: qty 2
  Filled 2012-06-24: qty 1
  Filled 2012-06-24 (×2): qty 2
  Filled 2012-06-24: qty 1
  Filled 2012-06-24 (×2): qty 2
  Filled 2012-06-24: qty 1
  Filled 2012-06-24 (×4): qty 2
  Filled 2012-06-24: qty 1
  Filled 2012-06-24: qty 2
  Filled 2012-06-24: qty 1
  Filled 2012-06-24: qty 2
  Filled 2012-06-24: qty 1
  Filled 2012-06-24: qty 2

## 2012-06-24 MED ORDER — GLYCOPYRROLATE 0.2 MG/ML IJ SOLN
INTRAMUSCULAR | Status: DC | PRN
  Administered 2012-06-24: 0.2 mg via INTRAVENOUS
  Administered 2012-06-24: .8 mg via INTRAVENOUS

## 2012-06-24 MED ORDER — NALOXONE HCL 0.4 MG/ML IJ SOLN: 0.0800 mg | INTRAMUSCULAR | Status: DC | PRN

## 2012-06-24 MED ORDER — AMOXICILLIN-POT CLAVULANATE 500-125 MG PO TABS: 1.0000 | ORAL_TABLET | Freq: Three times a day (TID) | ORAL | Status: DC

## 2012-06-24 MED ORDER — ROCURONIUM BROMIDE 100 MG/10ML IV SOLN
INTRAVENOUS | Status: DC | PRN
  Administered 2012-06-24: 50 mg via INTRAVENOUS

## 2012-06-24 MED ORDER — SIMVASTATIN 10 MG PO TABS
10.0000 mg | ORAL_TABLET | Freq: Every day | ORAL | Status: DC
  Administered 2012-06-24 – 2012-06-29 (×6): 10 mg via ORAL
  Filled 2012-06-24 (×7): qty 1

## 2012-06-24 MED ORDER — PROPOFOL 10 MG/ML IV BOLUS
INTRAVENOUS | Status: DC | PRN
  Administered 2012-06-24: 120 mg via INTRAVENOUS
  Administered 2012-06-24: 80 mg via INTRAVENOUS

## 2012-06-24 MED ORDER — NITROGLYCERIN 0.2 MG/ML ON CALL CATH LAB
INTRAVENOUS | Status: DC | PRN
  Administered 2012-06-24: 40 ug via INTRAVENOUS
  Administered 2012-06-24: 20 ug via INTRAVENOUS
  Administered 2012-06-24 (×3): 40 ug via INTRAVENOUS

## 2012-06-24 MED ORDER — THROMBIN 20000 UNITS EX SOLR
CUTANEOUS | Status: DC | PRN
  Administered 2012-06-24: 10:00:00 via TOPICAL

## 2012-06-24 MED ORDER — MORPHINE SULFATE 2 MG/ML IJ SOLN
1.0000 mg | INTRAMUSCULAR | Status: DC | PRN
  Administered 2012-06-24 – 2012-06-25 (×7): 2 mg via INTRAVENOUS
  Filled 2012-06-24 (×7): qty 1

## 2012-06-24 MED ORDER — MANNITOL 25 % IV SOLN
INTRAVENOUS | Status: DC | PRN
  Administered 2012-06-24: 37.5 g via INTRAVENOUS

## 2012-06-24 MED ORDER — WHITE PETROLATUM GEL
Status: AC
  Administered 2012-06-24: 0.2
  Filled 2012-06-24: qty 5

## 2012-06-24 MED ORDER — SODIUM CHLORIDE 0.9 % IV SOLN
INTRAVENOUS | Status: DC | PRN
  Administered 2012-06-24: 08:00:00 via INTRAVENOUS

## 2012-06-24 MED ORDER — HYDROMORPHONE HCL PF 1 MG/ML IJ SOLN: 0.2500 mg | INTRAMUSCULAR | Status: DC | PRN

## 2012-06-24 MED ORDER — SODIUM CHLORIDE 0.9 % IV SOLN
INTRAVENOUS | Status: DC | PRN
  Administered 2012-06-24: 09:00:00 via INTRAVENOUS

## 2012-06-24 MED ORDER — OXYCODONE HCL 5 MG/5ML PO SOLN: 5.0000 mg | Freq: Once | ORAL | Status: DC | PRN

## 2012-06-24 MED ORDER — MIDAZOLAM HCL 5 MG/5ML IJ SOLN
INTRAMUSCULAR | Status: DC | PRN
  Administered 2012-06-24: 2 mg via INTRAVENOUS
  Administered 2012-06-24 (×2): 1 mg via INTRAVENOUS

## 2012-06-24 MED ORDER — VECURONIUM BROMIDE 10 MG IV SOLR
INTRAVENOUS | Status: DC | PRN
  Administered 2012-06-24 (×4): 2 mg via INTRAVENOUS

## 2012-06-24 MED ORDER — DEXAMETHASONE SODIUM PHOSPHATE 10 MG/ML IJ SOLN
6.0000 mg | Freq: Four times a day (QID) | INTRAMUSCULAR | Status: DC
  Administered 2012-06-24 – 2012-06-25 (×3): 6 mg via INTRAVENOUS
  Filled 2012-06-24 (×4): qty 0.6

## 2012-06-24 MED ORDER — ONDANSETRON HCL 4 MG PO TABS: 4.0000 mg | ORAL_TABLET | ORAL | Status: DC | PRN

## 2012-06-24 MED ORDER — LABETALOL HCL 5 MG/ML IV SOLN: 10.0000 mg | INTRAVENOUS | Status: DC | PRN

## 2012-06-24 MED ORDER — OXYCODONE HCL 5 MG PO TABS: 5.0000 mg | ORAL_TABLET | Freq: Once | ORAL | Status: DC | PRN

## 2012-06-24 MED ORDER — ONDANSETRON HCL 4 MG/2ML IJ SOLN: 4.0000 mg | Freq: Once | INTRAMUSCULAR | Status: DC | PRN

## 2012-06-24 MED ORDER — HEMOSTATIC AGENTS (NO CHARGE) OPTIME
TOPICAL | Status: DC | PRN
  Administered 2012-06-24: 1 via TOPICAL

## 2012-06-24 MED ORDER — NEOSTIGMINE METHYLSULFATE 1 MG/ML IJ SOLN
INTRAMUSCULAR | Status: DC | PRN
  Administered 2012-06-24: 5 mg via INTRAVENOUS

## 2012-06-24 MED ORDER — CEFAZOLIN SODIUM 1-5 GM-% IV SOLN
1.0000 g | Freq: Three times a day (TID) | INTRAVENOUS | Status: AC
  Administered 2012-06-24 – 2012-06-25 (×2): 1 g via INTRAVENOUS
  Filled 2012-06-24 (×2): qty 50

## 2012-06-24 MED ORDER — SENNA 8.6 MG PO TABS
1.0000 | ORAL_TABLET | Freq: Two times a day (BID) | ORAL | Status: DC
  Administered 2012-06-24 – 2012-06-30 (×12): 8.6 mg via ORAL
  Filled 2012-06-24 (×14): qty 1

## 2012-06-24 MED ORDER — ARTIFICIAL TEARS OP OINT
TOPICAL_OINTMENT | OPHTHALMIC | Status: DC | PRN
  Administered 2012-06-24: 1 via OPHTHALMIC

## 2012-06-24 MED ORDER — MEPERIDINE HCL 25 MG/ML IJ SOLN: 6.2500 mg | INTRAMUSCULAR | Status: DC | PRN

## 2012-06-24 MED ORDER — POTASSIUM CHLORIDE IN NACL 20-0.9 MEQ/L-% IV SOLN
INTRAVENOUS | Status: DC
  Administered 2012-06-24 – 2012-06-25 (×2): via INTRAVENOUS
  Filled 2012-06-24 (×3): qty 1000

## 2012-06-24 MED ORDER — CHOLECALCIFEROL 25 MCG (1000 UT) PO TABS: 1000.0000 [IU] | ORAL_TABLET | Freq: Every day | ORAL | Status: DC

## 2012-06-24 MED ORDER — DEXAMETHASONE SODIUM PHOSPHATE 10 MG/ML IJ SOLN
INTRAMUSCULAR | Status: DC | PRN
  Administered 2012-06-24: 10 mg via INTRAVENOUS

## 2012-06-24 MED ORDER — BACITRACIN ZINC 500 UNIT/GM EX OINT
TOPICAL_OINTMENT | CUTANEOUS | Status: DC | PRN
  Administered 2012-06-24: 1 via TOPICAL

## 2012-06-24 MED ORDER — SENNOSIDES-DOCUSATE SODIUM 8.6-50 MG PO TABS
1.0000 | ORAL_TABLET | Freq: Every evening | ORAL | Status: DC | PRN
  Administered 2012-06-26: 1 via ORAL
  Filled 2012-06-24: qty 1

## 2012-06-24 SURGICAL SUPPLY — 105 items
APL SKNCLS STERI-STRIP NONHPOA (GAUZE/BANDAGES/DRESSINGS)
BANDAGE GAUZE ELAST BULKY 4 IN (GAUZE/BANDAGES/DRESSINGS) ×1 IMPLANT
BENZOIN TINCTURE PRP APPL 2/3 (GAUZE/BANDAGES/DRESSINGS) IMPLANT
BIT DRILL WIRE PASS 1.3MM (BIT) IMPLANT
BLADE EYE SICKLE 84 5 BEAV (BLADE) ×1 IMPLANT
BLADE SAW GIGLI 16 STRL (MISCELLANEOUS) IMPLANT
BLADE ULTRA TIP 2M (BLADE) IMPLANT
BRUSH SCRUB EZ 1% IODOPHOR (MISCELLANEOUS) ×1 IMPLANT
BRUSH SCRUB EZ PLAIN DRY (MISCELLANEOUS) ×1 IMPLANT
BUR ACORN 6.0 PRECISION (BURR) ×2 IMPLANT
BUR ADDG 1.1 (BURR) IMPLANT
BUR MATCHSTICK NEURO 3.0 LAGG (BURR) ×1 IMPLANT
BUR ROUTER D-58 CRANI (BURR) ×1 IMPLANT
CANISTER SUCTION 2500CC (MISCELLANEOUS) ×4 IMPLANT
CLIP TI MEDIUM 6 (CLIP) IMPLANT
CLOTH BEACON ORANGE TIMEOUT ST (SAFETY) ×2 IMPLANT
CONT SPEC 4OZ CLIKSEAL STRL BL (MISCELLANEOUS) ×3 IMPLANT
CORDS BIPOLAR (ELECTRODE) ×2 IMPLANT
DECANTER SPIKE VIAL GLASS SM (MISCELLANEOUS) ×2 IMPLANT
DRAIN SNY WOU 7FLT (WOUND CARE) IMPLANT
DRAPE MICROSCOPE LEICA (MISCELLANEOUS) ×2 IMPLANT
DRAPE NEUROLOGICAL W/INCISE (DRAPES) ×2 IMPLANT
DRAPE WARM FLUID 44X44 (DRAPE) ×2 IMPLANT
DRESSING TELFA 8X3 (GAUZE/BANDAGES/DRESSINGS) ×1 IMPLANT
DRILL WIRE PASS 1.3MM (BIT)
DURAMATRIX ONLAY 2X2 (Neuro Prosthesis/Implant) ×1 IMPLANT
DURAPREP 6ML APPLICATOR 50/CS (WOUND CARE) ×3 IMPLANT
ELECT CAUTERY BLADE 6.4 (BLADE) ×2 IMPLANT
ELECT REM PT RETURN 9FT ADLT (ELECTROSURGICAL) ×2
ELECTRODE REM PT RTRN 9FT ADLT (ELECTROSURGICAL) ×1 IMPLANT
EVACUATOR SILICONE 100CC (DRAIN) IMPLANT
FORCEPS BIPOLAR SPETZLER 8 1.0 (NEUROSURGERY SUPPLIES) ×1 IMPLANT
GAUZE SPONGE 4X4 16PLY XRAY LF (GAUZE/BANDAGES/DRESSINGS) ×1 IMPLANT
GLOVE BIO SURGEON STRL SZ 6.5 (GLOVE) IMPLANT
GLOVE BIO SURGEON STRL SZ7 (GLOVE) IMPLANT
GLOVE BIO SURGEON STRL SZ7.5 (GLOVE) IMPLANT
GLOVE BIO SURGEON STRL SZ8 (GLOVE) ×1 IMPLANT
GLOVE BIO SURGEON STRL SZ8.5 (GLOVE) IMPLANT
GLOVE BIOGEL M 8.0 STRL (GLOVE) IMPLANT
GLOVE BIOGEL PI IND STRL 7.0 (GLOVE) IMPLANT
GLOVE BIOGEL PI IND STRL 8 (GLOVE) IMPLANT
GLOVE BIOGEL PI INDICATOR 7.0 (GLOVE) ×3
GLOVE BIOGEL PI INDICATOR 8 (GLOVE) ×1
GLOVE ECLIPSE 6.5 STRL STRAW (GLOVE) ×4 IMPLANT
GLOVE ECLIPSE 7.0 STRL STRAW (GLOVE) IMPLANT
GLOVE ECLIPSE 7.5 STRL STRAW (GLOVE) ×2 IMPLANT
GLOVE ECLIPSE 8.0 STRL XLNG CF (GLOVE) IMPLANT
GLOVE ECLIPSE 8.5 STRL (GLOVE) IMPLANT
GLOVE EXAM NITRILE LRG STRL (GLOVE) IMPLANT
GLOVE EXAM NITRILE MD LF STRL (GLOVE) IMPLANT
GLOVE EXAM NITRILE XL STR (GLOVE) IMPLANT
GLOVE EXAM NITRILE XS STR PU (GLOVE) IMPLANT
GLOVE INDICATOR 6.5 STRL GRN (GLOVE) IMPLANT
GLOVE INDICATOR 7.0 STRL GRN (GLOVE) IMPLANT
GLOVE INDICATOR 7.5 STRL GRN (GLOVE) IMPLANT
GLOVE INDICATOR 8.0 STRL GRN (GLOVE) IMPLANT
GLOVE INDICATOR 8.5 STRL (GLOVE) IMPLANT
GLOVE OPTIFIT SS 8.0 STRL (GLOVE) IMPLANT
GLOVE SURG SS PI 6.5 STRL IVOR (GLOVE) IMPLANT
GLOVE SURG SS PI 7.0 STRL IVOR (GLOVE) ×5 IMPLANT
GOWN BRE IMP SLV AUR LG STRL (GOWN DISPOSABLE) ×4 IMPLANT
GOWN BRE IMP SLV AUR XL STRL (GOWN DISPOSABLE) ×5 IMPLANT
GOWN STRL REIN 2XL LVL4 (GOWN DISPOSABLE) IMPLANT
HANDLE SUPERFICIAL 5IN (MISCELLANEOUS) ×1 IMPLANT
HEMOSTAT SURGICEL 2X14 (HEMOSTASIS) ×2 IMPLANT
HOOK DURA (MISCELLANEOUS) ×2 IMPLANT
KIT BASIN OR (CUSTOM PROCEDURE TRAY) ×2 IMPLANT
KIT DRAIN CSF ACCUDRAIN (MISCELLANEOUS) IMPLANT
KIT ROOM TURNOVER OR (KITS) ×2 IMPLANT
NDL HYPO 25X1 1.5 SAFETY (NEEDLE) ×1 IMPLANT
NEEDLE HYPO 25X1 1.5 SAFETY (NEEDLE) ×2 IMPLANT
NS IRRIG 1000ML POUR BTL (IV SOLUTION) ×2 IMPLANT
PACK CRANIOTOMY (CUSTOM PROCEDURE TRAY) ×2 IMPLANT
PAD ARMBOARD 7.5X6 YLW CONV (MISCELLANEOUS) ×3 IMPLANT
PATTIES SURGICAL .25X.25 (GAUZE/BANDAGES/DRESSINGS) IMPLANT
PATTIES SURGICAL .5 X.5 (GAUZE/BANDAGES/DRESSINGS) IMPLANT
PATTIES SURGICAL .5 X1 (DISPOSABLE) ×1 IMPLANT
PATTIES SURGICAL .5 X3 (DISPOSABLE) ×1 IMPLANT
PATTIES SURGICAL 1/4 X 3 (GAUZE/BANDAGES/DRESSINGS) IMPLANT
PATTIES SURGICAL 1X1 (DISPOSABLE) IMPLANT
PIN MAYFIELD SKULL DISP (PIN) ×1 IMPLANT
PLATE 1.5  2HOLE LNG NEURO (Plate) ×4 IMPLANT
PLATE 1.5 2HOLE LNG NEURO (Plate) IMPLANT
RUBBERBAND STERILE (MISCELLANEOUS) ×5 IMPLANT
SCREW SELF DRILL HT 1.5/4MM (Screw) ×8 IMPLANT
SPONGE GAUZE 4X4 12PLY (GAUZE/BANDAGES/DRESSINGS) ×1 IMPLANT
SPONGE NEURO XRAY DETECT 1X3 (DISPOSABLE) ×1 IMPLANT
SPONGE SURGIFOAM ABS GEL 100 (HEMOSTASIS) IMPLANT
SPONGE SURGIFOAM ABS GEL 100C (HEMOSTASIS) ×2 IMPLANT
STAPLER SKIN PROX WIDE 3.9 (STAPLE) ×2 IMPLANT
SUT ETHILON 3 0 FSL (SUTURE) IMPLANT
SUT NURALON 4 0 TR CR/8 (SUTURE) ×4 IMPLANT
SUT VIC AB 2-0 CT2 18 VCP726D (SUTURE) ×6 IMPLANT
SUT VIC AB 3-0 SH 8-18 (SUTURE) IMPLANT
SYR 20ML ECCENTRIC (SYRINGE) ×2 IMPLANT
SYR CONTROL 10ML LL (SYRINGE) ×2 IMPLANT
TIP NONSTICK .5MMX23CM (INSTRUMENTS) ×2
TIP NONSTICK .5X23 (INSTRUMENTS) IMPLANT
TOWEL OR 17X24 6PK STRL BLUE (TOWEL DISPOSABLE) ×2 IMPLANT
TOWEL OR 17X26 10 PK STRL BLUE (TOWEL DISPOSABLE) ×2 IMPLANT
TRAY FOLEY CATH 14FRSI W/METER (CATHETERS) IMPLANT
UNDERPAD 30X30 INCONTINENT (UNDERPADS AND DIAPERS) IMPLANT
WATER STERILE IRR 1000ML POUR (IV SOLUTION) ×2 IMPLANT
Yasargil 9mm aneurysm clip standard straight ×1 IMPLANT
Yasargil 9mm temporary aneurysm clip ×1 IMPLANT

## 2012-06-24 NOTE — Op Note (Addendum)
06/24/2012  1:50 PM  PATIENT:  [name] [name]  52 y.o. female  PRE-OPERATIVE DIAGNOSIS:  Aneurysm, Right ICA terminus  POST-OPERATIVE DIAGNOSIS:  Aneurysm, right ICA terminus  PROCEDURE:  Procedure(s): right pterional CRANIOTOMY for right ICA INTRACRANIAL ANEURYSM clipping  Microdissection SURGEON:  Surgeon(s): Carmela Hurt, MD Tia Alert, MD  ASSISTANTS:jones, david  ANESTHESIA:   general  EBL:  Total I/O In: 2000 [I.V.:2000] Out: 860 [Urine:660; Blood:200]  BLOOD ADMINISTERED:none  CELL SAVER GIVEN:none  COUNT:per nursing  DRAINS: none   SPECIMEN:  No Specimen  DICTATION: Mrs. Prisco was taken to the operating room, intubated and placed under a general anesthetic without difficulty.She had an arterial line placed, and a foley catheter placed under sterile conditions. She was positioned supine with her head turned towards the left. I shaved her head, then placed a 3 pin [place] head holder on her head after anesthesia was deemed sufficient. I positioned her head so that the malar eminence was the highest point. Her head was prepped, and draped in a sterile manner. I infiltrated 10cc lidocaine into the scalp for a standard pterional approach. I opened the skin with a 10 blade and developed a scalp flap. I placed Raney clips on the scalp edges to control bleeding. I retracted the scalp, then opened the temporalis fascia which was now exposed. I used a scalpel and Metzenbaum scissors to open the fascia in a curvilinear fashion extending laterally to the root of the zygomatic process. I retracted the temporalis fascia and reflected the temporalis muscle inferolaterally. I used the drill to create a burr hole in the pterion, and the temporal bone. I notched the greater wing of the sphenoid bone. I connected the burr holes with the craniotome, and elevated the flap cracking it anteriorly at the notch. I drilled the sphenoid wing and also used the leksell rongeur, and the bayer  rongeur to flatten out the bone between the anterior and middle fossae. I opened the dura, reflecting it anteriorly exposing the sylvian fissure.  With Dr. Yetta Barre assistance and microdissection I split the sylvian fissure lateral to medial and also from the skull base to lateral. We exposed the internal carotid artery, the middle cerebral artery, and the anterior cerebral artery. I used a retractor on the frontal lobe to aid in the dissection. With microdissection the aneurysm was exposed at the ICA terminus. During the dissection there appeared to be a small sidehole which opened on the medial side of the ICA. This was controlled with bipolar cautery. I never saw an avulsed vessel, nor when the bleeding started were we in the caritcooptic cistern where the bleeding was. Nevertheless, I dissected further on the aneurysm dome. It was round, smooth, and regular. I ensured I had a place to use a temporary clip on the ICA proximal to the aneurysm. I placed a temporary clip on the ICA, then placed a straight 7mm clip across the neck of the aneurysm. I dissected around the aneurysm after placing the clip and felt that the entire aneurysm neck was within the clip blades. I removed the temporary clip after ~2.5 minutes. Propofol had been bolused prior to the temporary clip placement.  I irrigated copiously and we did have good hemostasis. In addition I cut a small piece of the raytec sponge and placed it over the coagulated area on the carotid artery to augment the coagulation.  We then proceeded to close the craniotomy approximating the dura very loosely. I did place a duragen graft over the  dural opening. The skull flap was replaced and secured with plates and screws.I approximated the scalp with subgaleal sutures, and the scalp edges with staples. I applied a sterile dressing. She was then extubated without difficulty.    PLAN OF CARE: Admit to inpatient   PATIENT DISPOSITION:  PACU - hemodynamically stable.    Delay start of Pharmacological VTE agent (>24hrs) due to surgical blood loss or risk of bleeding:  yes

## 2012-06-24 NOTE — Anesthesia Procedure Notes (Addendum)
Procedure Name: Intubation Date/Time: 06/24/2012 9:32 AM Performed by: Jeani Hawking Pre-anesthesia Checklist: Patient identified, Timeout performed, Emergency Drugs available, Suction available and Patient being monitored Patient Re-evaluated:Patient Re-evaluated prior to inductionOxygen Delivery Method: Circle system utilized Preoxygenation: Pre-oxygenation with 100% oxygen Intubation Type: IV induction Ventilation: Mask ventilation without difficulty and Oral airway inserted - appropriate to patient size Laryngoscope Size: Hyacinth Meeker and 2 Grade View: Grade II Tube type: Oral Tube size: 7.0 mm Number of attempts: 1 Airway Equipment and Method: Rigid stylet Placement Confirmation: ETT inserted through vocal cords under direct vision,  breath sounds checked- equal and bilateral,  CO2 detector and positive ETCO2 Secured at: 22 cm Tube secured with: Tape Dental Injury: Teeth and Oropharynx as per pre-operative assessment

## 2012-06-24 NOTE — Progress Notes (Signed)
Patient ID: [name] [name], female   DOB: Apr 02, 1961, 52 y.o.   MRN: [id number] BP 126/83  Pulse 78  Temp(Src) 98.3 F (36.8 C) (Oral)  Resp 16  Ht 5\' 6"  (1.676 m)  Wt 91.9 kg (202 lb 9.6 oz)  BMI 32.72 kg/m2  SpO2 100% Alert and following all commands Speaking well Moving all extremities well  Dressing in place.

## 2012-06-24 NOTE — Anesthesia Preprocedure Evaluation (Addendum)
Anesthesia Evaluation  Patient identified by MRN, date of birth, ID band Patient awake    Reviewed: Allergy & Precautions, H&P , NPO status , Patient's Chart, lab work & pertinent test results, reviewed documented beta blocker date and time   History of Anesthesia Complications Negative for: history of anesthetic complications  Airway Mallampati: II TM Distance: >3 FB Neck ROM: Full    Dental  (+) Teeth Intact and Dental Advisory Given   Pulmonary former smoker,          Cardiovascular hypertension, Pt. on medications and Pt. on home beta blockers     Neuro/Psych  Headaches, negative psych ROS   GI/Hepatic hiatal hernia, GERD-  Medicated and Controlled,  Endo/Other  negative endocrine ROS  Renal/GU      Musculoskeletal negative musculoskeletal ROS (+)   Abdominal   Peds  Hematology negative hematology ROS (+)   Anesthesia Other Findings   Reproductive/Obstetrics negative OB ROS                         Anesthesia Physical Anesthesia Plan  ASA: III  Anesthesia Plan: General   Post-op Pain Management:    Induction: Intravenous  Airway Management Planned: Oral ETT  Additional Equipment: Arterial line, CVP and Ultrasound Guidance Line Placement  Intra-op Plan:   Post-operative Plan: Extubation in OR  Informed Consent: I have reviewed the patients History and Physical, chart, labs and discussed the procedure including the risks, benefits and alternatives for the proposed anesthesia with the patient or authorized representative who has indicated his/her understanding and acceptance.     Plan Discussed with: CRNA and Surgeon  Anesthesia Plan Comments:        Anesthesia Quick Evaluation

## 2012-06-24 NOTE — Anesthesia Postprocedure Evaluation (Signed)
Anesthesia Post Note  Patient: [name] [name]  Procedure(s) Performed: Procedure(s) (LRB): CRANIOTOMY INTRACRANIAL ANEURYSM FOR CAROTID (Right)  Anesthesia type: general  Patient location: PACU  Post pain: Pain level controlled  Post assessment: Patient's Cardiovascular Status Stable  Last Vitals:  Filed Vitals:   06/24/12 1434  BP:   Pulse: 59  Temp:   Resp: 19    Post vital signs: Reviewed and stable  Level of consciousness: sedated  Complications: No apparent anesthesia complications

## 2012-06-24 NOTE — Transfer of Care (Signed)
Immediate Anesthesia Transfer of Care Note  Patient: [name] [name]  Procedure(s) Performed: Procedure(s) with comments: CRANIOTOMY INTRACRANIAL ANEURYSM FOR CAROTID (Right) - RIGHT Pterional Craniotomy for aneurysm  Patient Location: PACU  Anesthesia Type:General  Level of Consciousness: awake, alert  and oriented  Airway & Oxygen Therapy: Patient Spontanous Breathing and Patient connected to nasal cannula oxygen  Post-op Assessment: Report given to PACU RN and Post -op Vital signs reviewed and stable  Post vital signs: Reviewed and stable  Complications: No apparent anesthesia complications

## 2012-06-24 NOTE — Progress Notes (Signed)
Pt. Able to follow commands, not talking yet, very sleepy ,Dr. Michelle Piper and Dr. Franky Macho aware.

## 2012-06-24 NOTE — H&P (Signed)
BP 141/94  Pulse 72  Temp(Src) 97.7 F (36.5 C)  Resp 20  SpO2 99% [name] comes in today for evaluation of an unruptured cerebral artery aneurysm.  She had this identified simply because she was having a great deal of pain in her head and underwent an MRI scan.  This revealed the aneurysm, which is approximately 4 to 5 mm., and she was sent to me for further evaluation by Dr. Channing Mutters.  He did not believe that the aneurysm had any role whatsoever with regards to her headaches.  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    PAST MEDICAL HISTORY:  Hypertension, hiatal hernia, diverticulosis and a benign nodule on her right lung, which is 3 mm. in size.  She reports having headaches, bulging disc, neck pain causing headaches and an aneurysm in the right carotid terminus.  The aneurysm was identified in February of 2013 and she had not been seen by a neurosurgeon.  She also has a history of an abscess in her buttock, which was treated.    FAMILY HISTORY:    Mother 57 is in good health with hypertension.  Father is deceased.  He had a myocardial infarction.  She has two brothers who are older with diabetes; one Type I and the other Type II.    PAST SURGICAL HISTORY:  She has undergone a lipoma removal.    MEDICATIONS:    She takes Metoprolol, Vitamin D, Diltiazem, Omeprazole and Loratadine.    DRUG ALLERGIES:   She also feels that she is allergic to Ace Inhibitors, Azithromycin, Methylprednisolone, Tetracycline, Ciprofloxacin.    SOCIAL HISTORY:    She doesn't smoke.  She does not use alcohol.  She does not use illicit drugs.  She is 5', 2" tall.  She weighs 199 lbs.    REVIEW OF SYSTEMS:   Positive for eyeglasses, right tinnitus, sinus problems, hypertension, hypercholesterolemia, headaches, arm pain, neck pain and she is allergic to cats.  She denies constitutional, gastrointestinal,  respiratory, genitourinary, skin, neurological,     psychiatric, endocrine or hematologic problems.  She says she has had the headaches for 2 years mainly at night.  She says that she will see spots in her head in various places.    She did have an MRI of the cervical spine, which I did review.  She has garden variety cervical spondylosis, but nothing whatsoever that would be causing headaches.    EXAMINATION:    On examination she is alert, oriented x 4 and answering all questions appropriately.  Memory, language, attention span and fund of knowledge are normal.  She is well kempt and in no distress.  Speech is clear and fluent.  Pupils are equal, round and reactive to light.  Full extraocular movements.  Full visual fields.  Hearing intact to finger rub bilaterally.  Symmetric facial sensation and movement.  Uvula elevates in the midline.  Shoulder shrug is normal.  Tongue protrudes in the midline.  No drift on examination.  5/5 strength in both upper and lower extremities.  Romberg is normal.  Normal muscle tone, bulk and coordination.  No cervical masses or bruits.  Lungs clear.  Heart regular rhythm and rate.  No murmurs or rubs.  Pulse is good at the wrists.  Head normocephalic, atraumatic.  Oral mucosa is normal.  Sclera not injected.    DIAGNOSTIC STUDIES:   MRA is  reviewed.  It shows the right carotid terminus aneurysm.  To get the actual size is hard on the MRI as the resolution is not great.  No other abnormalities identified.  The MRI of the brain is otherwise normal.  MRI of the cervical spine shows some spondylitic change, but no herniated disc.  Cord signal is normal.  No stenosis at any level.  Paraspinous soft tissues are normal.    DIAGNOSIS:     Right internal carotid artery aneurysm.    SUMMARY:     I believe that [name] [name] does need to have this treated.  We discussed options including coiling and clipping.  I think clipping is a much more durable form of treatment, especially  given her young age.  The aneurysm is certainly accessible and this can be done on an elective basis.  We discussed the risk of hemorrhage from the aneurysm 1 to 2% per year, the devastating consequences that can occur if an aneurysm ruptures, but I also emphasized that she is not living with a time bomb.  [name] [name] agrees with clipping and we discussed the risks and benefits including but not limited to bleeding, infection, stroke, coma, death, paralysis, weakness, change in personality, speech and language problems, anosmia, brain damage, inability to clip aneurysm. She understands and wishes to proceed.

## 2012-06-24 NOTE — Preoperative (Addendum)
Beta Blockers   Reason not to administer Beta Blockers:Not Applicable 

## 2012-06-25 DIAGNOSIS — I671 Cerebral aneurysm, nonruptured: Secondary | ICD-10-CM | POA: Diagnosis present

## 2012-06-25 MED ORDER — PANTOPRAZOLE SODIUM 40 MG PO TBEC
40.0000 mg | DELAYED_RELEASE_TABLET | Freq: Every day | ORAL | Status: DC
  Administered 2012-06-25 – 2012-06-29 (×5): 40 mg via ORAL
  Filled 2012-06-25 (×5): qty 1

## 2012-06-25 MED ORDER — DEXAMETHASONE 4 MG PO TABS
4.0000 mg | ORAL_TABLET | Freq: Three times a day (TID) | ORAL | Status: DC
  Administered 2012-06-25 – 2012-06-26 (×6): 4 mg via ORAL
  Filled 2012-06-25 (×8): qty 1

## 2012-06-25 NOTE — Progress Notes (Signed)
Patient ID: [name] [name], female   DOB: 1961-02-21, 52 y.o.   MRN: [id number] BP 107/65  Pulse 60  Temp(Src) 98.1 F (36.7 C) (Oral)  Resp 16  Ht 5\' 6"  (1.676 m)  Wt 91.9 kg (202 lb 9.6 oz)  BMI 32.72 kg/m2  SpO2 95% Alert and oriented x 4. Speech is clear and fluent. Pupils equal round and reactive to light, full eom. Significant periorbital edema on right side.  Symmetric facial movements Moving all extremities well Dressing reinforced. Arterial line removed, as was the foley.  Transfer to floor tomorrow.

## 2012-06-25 NOTE — Plan of Care (Signed)
Problem: Consults Goal: Diagnosis - Craniotomy Outcome: Completed/Met Date Met:  06/25/12 Aneurysm clipping

## 2012-06-25 NOTE — Progress Notes (Signed)
UR completed 

## 2012-06-26 MED ORDER — DEXAMETHASONE 4 MG PO TABS
4.0000 mg | ORAL_TABLET | Freq: Two times a day (BID) | ORAL | Status: DC
  Administered 2012-06-27 – 2012-06-30 (×7): 4 mg via ORAL
  Filled 2012-06-26 (×8): qty 1

## 2012-06-26 NOTE — Progress Notes (Signed)
Pt transferred to 4N10. No distress noted.

## 2012-06-27 NOTE — Progress Notes (Signed)
Patient ID: [name] [name], female   DOB: 26-Mar-1961, 52 y.o.   MRN: [id number] BP 113/70  Pulse 53  Temp(Src) 97.2 F (36.2 C) (Oral)  Resp 18  Ht 5\' 6"  (1.676 m)  Wt 91.9 kg (202 lb 9.6 oz)  BMI 32.72 kg/m2  SpO2 99% Alert and oriented x 4, speech is clear and fluent Perrl, full eom Symmetric fAcies and facial movements No drift Wound clean, dry, without signs of infection Dc Monday, no one available at home until then Doing well

## 2012-06-27 NOTE — Progress Notes (Signed)
Serous drainage oozing from bottom staple on right side of the head; minimal drainage.  2X2  Gauze applied to collect any drainage; will monitor.

## 2012-06-28 ENCOUNTER — Encounter (HOSPITAL_COMMUNITY): Payer: Self-pay | Admitting: Neurosurgery

## 2012-06-28 MED ORDER — POLYETHYLENE GLYCOL 3350 17 G PO PACK
17.0000 g | PACK | Freq: Every day | ORAL | Status: DC | PRN
  Filled 2012-06-28: qty 1

## 2012-06-28 NOTE — Progress Notes (Signed)
Doing very well. Minimal headache. No other complaints.  Afebrile. Vitals normal. Wound clean and dry currently. Neurologically awake and alert oriented appropriate speech fluent motor and sensory is extremities intact.  Doing well. Probable discharge tomorrow morning.

## 2012-06-30 MED ORDER — HYDROCODONE-ACETAMINOPHEN 5-325 MG PO TABS: 1.0000 | ORAL_TABLET | Freq: Four times a day (QID) | ORAL | Status: DC | PRN

## 2012-06-30 MED ORDER — LEVETIRACETAM 500 MG PO TABS: 500.0000 mg | ORAL_TABLET | Freq: Two times a day (BID) | ORAL | Status: DC

## 2012-07-01 NOTE — Discharge Summary (Signed)
Physician Discharge Summary  Patient ID: [name] [name] MRN: [id number] DOB/AGE: Sep 04, 1960 52 y.o.  Admit date: 06/24/2012 Discharge date: 07/01/2012  Admission Diagnoses:Aneurysm, Right ICA terminus   Discharge Diagnoses: Aneurysm, Right ICA terminus  Active Problems:   Aneurysm, cerebral, nonruptured   Discharged Condition: good  Hospital Course: [name] was admitted and taken to the OR for an uncomplicated craniotomy for aneurysm clipping. Post op she has done very well. Speech is clear and fluent, perrl, full eom. Tongue and uvula midline.moving all extremities well. Tolerating a regular diet, ambulating, and voiding normally at discharge.  Consults: None  Significant Diagnostic Studies: none  Treatments: surgery: Aneurysm, Right ICA terminus right pterional CRANIOTOMY for right ICA INTRACRANIAL ANEURYSM clipping   Microdissection   Discharge Exam: Blood pressure 138/73, pulse 51, temperature 98.4 F (36.9 C), temperature source Oral, resp. rate 18, height 5\' 6"  (1.676 m), weight 83.3 kg (183 lb 10.3 oz), SpO2 95.00%. General appearance: alert, cooperative and appears stated age Neurologic: Alert and oriented X 3, normal strength and tone. Normal symmetric reflexes. Normal coordination and gait  Disposition: 01-Home or Self Care     Medication List    TAKE these medications       acetaminophen 500 MG tablet  Commonly known as:  TYLENOL  Take 1,000 mg by mouth every 6 (six) hours as needed for pain.     amoxicillin-clavulanate 500-125 MG per tablet  Commonly known as:  AUGMENTIN  Take 1 tablet by mouth 3 (three) times daily.     amoxicillin-clavulanate 875-125 MG per tablet  Commonly known as:  AUGMENTIN  Take 1 tablet by mouth 2 (two) times daily.     D 1000 1000 UNITS tablet  Generic drug:  Cholecalciferol  Take 1,000 Units by mouth daily.     diltiazem 180 MG 24 hr capsule  Commonly known as:  CARDIZEM CD  Take 180 mg by mouth at bedtime.      guaiFENesin-dextromethorphan 100-10 MG/5ML syrup  Commonly known as:  ROBITUSSIN DM  Take 15 mLs by mouth 3 (three) times daily as needed for cough.     HYDROcodone-acetaminophen 5-325 MG per tablet  Commonly known as:  NORCO/VICODIN  Take 1 tablet by mouth every 6 (six) hours as needed for pain.     levETIRAcetam 500 MG tablet  Commonly known as:  KEPPRA  Take 1 tablet (500 mg total) by mouth every 12 (twelve) hours.     loratadine 10 MG tablet  Commonly known as:  CLARITIN  Take 10 mg by mouth daily before breakfast.     lovastatin 20 MG tablet  Commonly known as:  MEVACOR  Take 20 mg by mouth at bedtime.     metoprolol 50 MG tablet  Commonly known as:  LOPRESSOR  Take 50 mg by mouth 2 (two) times daily.           Follow-up Information   Follow up with CABBELL,KYLE L, MD In 1 week. (call to make appointment for staple removal)    Contact information:   [address]                         [address] [place] [address] [phone]       Signed: CABBELL,KYLE L 07/01/2012, 9:36 AM

## 2012-07-01 NOTE — Care Management Note (Signed)
    Page 1 of 1   07/01/2012     8:26:25 AM   CARE MANAGEMENT NOTE 07/01/2012  Patient:  [name] [name],[name] [name]   Account Number:  [id number]  Date Initiated:  06/30/2012  Documentation initiated by:  Jacquelynn Cree  Subjective/Objective Assessment:   admitted postop craniotomy for aneurysm clipping     Action/Plan:   return home   Anticipated DC Date:  06/30/2012   Anticipated DC Plan:  HOME/SELF CARE      DC Planning Services  CM consult      Choice offered to / List presented to:             Status of service:  Completed, signed off Medicare Important Message given?   (If response is "NO", the following Medicare IM given date fields will be blank) Date Medicare IM given:   Date Additional Medicare IM given:    Discharge Disposition:  HOME/SELF CARE  Per UR Regulation:  Reviewed for med. necessity/level of care/duration of stay  If discussed at Long Length of Stay Meetings, dates discussed:    Comments:

## 2012-08-07 ENCOUNTER — Emergency Department (HOSPITAL_COMMUNITY)
Admission: EM | Admit: 2012-08-07 | Discharge: 2012-08-07 | Disposition: A | Discharge status: Home / Self Care | Payer: BC Managed Care – PPO | Attending: Emergency Medicine | Admitting: Emergency Medicine

## 2012-08-07 ENCOUNTER — Encounter (HOSPITAL_COMMUNITY): Payer: Self-pay | Admitting: *Deleted

## 2012-08-07 ENCOUNTER — Emergency Department (HOSPITAL_COMMUNITY): Payer: BC Managed Care – PPO

## 2012-08-07 DIAGNOSIS — Z8679 Personal history of other diseases of the circulatory system: Secondary | ICD-10-CM | POA: Insufficient documentation

## 2012-08-07 DIAGNOSIS — N189 Chronic kidney disease, unspecified: Secondary | ICD-10-CM | POA: Insufficient documentation

## 2012-08-07 DIAGNOSIS — R509 Fever, unspecified: Secondary | ICD-10-CM

## 2012-08-07 DIAGNOSIS — Z8719 Personal history of other diseases of the digestive system: Secondary | ICD-10-CM | POA: Insufficient documentation

## 2012-08-07 DIAGNOSIS — E785 Hyperlipidemia, unspecified: Secondary | ICD-10-CM | POA: Insufficient documentation

## 2012-08-07 DIAGNOSIS — R51 Headache: Secondary | ICD-10-CM

## 2012-08-07 DIAGNOSIS — Z79899 Other long term (current) drug therapy: Secondary | ICD-10-CM | POA: Insufficient documentation

## 2012-08-07 DIAGNOSIS — E663 Overweight: Secondary | ICD-10-CM | POA: Insufficient documentation

## 2012-08-07 DIAGNOSIS — Z8619 Personal history of other infectious and parasitic diseases: Secondary | ICD-10-CM | POA: Insufficient documentation

## 2012-08-07 DIAGNOSIS — Z8742 Personal history of other diseases of the female genital tract: Secondary | ICD-10-CM | POA: Insufficient documentation

## 2012-08-07 DIAGNOSIS — I129 Hypertensive chronic kidney disease with stage 1 through stage 4 chronic kidney disease, or unspecified chronic kidney disease: Secondary | ICD-10-CM | POA: Insufficient documentation

## 2012-08-07 DIAGNOSIS — Z87891 Personal history of nicotine dependence: Secondary | ICD-10-CM | POA: Insufficient documentation

## 2012-08-07 DIAGNOSIS — Z8709 Personal history of other diseases of the respiratory system: Secondary | ICD-10-CM | POA: Insufficient documentation

## 2012-08-07 LAB — BASIC METABOLIC PANEL
Chloride: 100 mEq/L (ref 96–112)
GFR calc Af Amer: 66 mL/min — ABNORMAL LOW (ref >90)
GFR calc non Af Amer: 57 mL/min — ABNORMAL LOW (ref >90)
Potassium: 3.5 mEq/L (ref 3.5–5.1)
Sodium: 138 mEq/L (ref 135–145)

## 2012-08-07 LAB — CBC
Hemoglobin: 12.5 g/dL (ref 12.0–15.0)
MCHC: 33.8 g/dL (ref 30.0–36.0)
RDW: 13.2 % (ref 11.5–15.5)
WBC: 11.1 10*3/uL — ABNORMAL HIGH (ref 4.0–10.5)

## 2012-08-07 MED ORDER — MORPHINE SULFATE 4 MG/ML IJ SOLN
6.0000 mg | Freq: Once | INTRAMUSCULAR | Status: AC
  Administered 2012-08-07: 6 mg via INTRAVENOUS
  Filled 2012-08-07: qty 2

## 2012-08-07 MED ORDER — ONDANSETRON HCL 4 MG/2ML IJ SOLN
4.0000 mg | Freq: Once | INTRAMUSCULAR | Status: AC
  Administered 2012-08-07: 4 mg via INTRAVENOUS
  Filled 2012-08-07: qty 2

## 2012-08-07 MED ORDER — SODIUM CHLORIDE 0.9 % IV BOLUS (SEPSIS)
1000.0000 mL | Freq: Once | INTRAVENOUS | Status: AC
  Administered 2012-08-07: 1000 mL via INTRAVENOUS

## 2012-08-07 MED ORDER — KETOROLAC TROMETHAMINE 30 MG/ML IJ SOLN
30.0000 mg | Freq: Once | INTRAMUSCULAR | Status: AC
  Administered 2012-08-07: 30 mg via INTRAVENOUS
  Filled 2012-08-07: qty 1

## 2012-08-07 MED ORDER — OXYCODONE-ACETAMINOPHEN 5-325 MG PO TABS: 1.0000 | ORAL_TABLET | ORAL | Status: DC | PRN

## 2012-08-07 MED ORDER — METOCLOPRAMIDE HCL 5 MG/ML IJ SOLN
10.0000 mg | Freq: Once | INTRAMUSCULAR | Status: AC
  Administered 2012-08-07: 10 mg via INTRAVENOUS
  Filled 2012-08-07: qty 2

## 2012-08-07 MED ORDER — ONDANSETRON 8 MG PO TBDP: 8.0000 mg | ORAL_TABLET | Freq: Three times a day (TID) | ORAL | Status: DC | PRN

## 2012-08-07 MED ORDER — ACETAMINOPHEN 500 MG PO TABS
1000.0000 mg | ORAL_TABLET | Freq: Once | ORAL | Status: AC
  Administered 2012-08-07: 1000 mg via ORAL
  Filled 2012-08-07: qty 2

## 2012-08-07 MED ORDER — IOHEXOL 350 MG/ML SOLN
80.0000 mL | Freq: Once | INTRAVENOUS | Status: AC | PRN
  Administered 2012-08-07: 80 mL via INTRAVENOUS

## 2012-08-07 NOTE — ED Notes (Signed)
Pt had cerebral aneurysm repair in Feb this year, states she has been having severe right sided head pain.  Pt went to [facility] hospital yesterday and had a head ct that was fine. Today pt states she is having "piercing" sharp pain in the right side of her head with nausea, no vomiting.  Pt is also running fever tonight.

## 2012-08-07 NOTE — ED Notes (Signed)
States she had an aneurysm repaired 2 months ago, states she was seen at [facility] for same yesterday, states pain woke her up today

## 2012-08-07 NOTE — ED Provider Notes (Signed)
History     CSN: [id number]  Arrival date & time 08/07/12  1857   First MD Initiated Contact with Patient 08/07/12 1921      Chief Complaint  Patient presents with  . Headache    (Consider location/radiation/quality/duration/timing/severity/associated sxs/prior treatment) HPI Patient reports headache and fever over the past 48 hours.  She denies neck pain or neck stiffness.  Family reports no altered mental status.  The patient denies cough congestion or chest pain.  She denies urinary symptoms.  No vomiting or diarrhea.  She has had nausea.  She denies dental pain.  No sore throat.  The patient is status post craniotomy and right internal carotid aneurysm clipping.  After the procedure she had developed mild headaches which are usually controlled with hydrocodone.  She reports her headache is worsened in the past 2 days.  She was seen in outside hospital yesterday and a CT scan was performed and the patient was given pain medication reports that this morning her headache worsened and thus she presented emergency department.  No facial asymmetry noted by myself her family.  She denies weakness of her upper lower extremities.   Past Medical History  Diagnosis Date  . Overweight     Obesity  . Hypertension   . Hyperlipidemia   . Palpitations   . Chest pain     denied on 06/18/2012  . Hypoglycemia   . Uterine fibroid     pt. states that she has a "closed cervix"   . Chronic kidney disease     L - upper pole- defect, doesn't effect     . Sinusitis, acute     seen by PCP- 06/17/2012, will treat /w augmentin & tussin/DM  . H/O hiatal hernia   . Headache     using tylenol prn  . Aneurysm     brain  . Normal cardiac stress test     pt. released fr. [name] care in 10/2011, all findings w/i normal limits  . H. pylori infection     2012- stomach biopsy  . GERD (gastroesophageal reflux disease)     h/o colitis   . Family history of anesthesia complication     brother & neice  "flat lined" during induction: neice d/t a medication given for a blood clotting problem; brother d/t "miscalculated amount of anesthesia"    Past Surgical History  Procedure Laterality Date  . Colonoscopy    . Lipoma excision      buttock 10/2010  . Craniotomy Right 06/24/2012    Procedure: CRANIOTOMY INTRACRANIAL ANEURYSM FOR CAROTID;  Surgeon: Carmela Hurt, MD;  Location: [facility];  Service: Neurosurgery;  Laterality: Right;  RIGHT Pterional Craniotomy for aneurysm    Family History  Problem Relation Age of Onset  . Hypertension Other   . Coronary artery disease Other     History  Substance Use Topics  . Smoking status: Former Smoker -- 0.80 packs/day for 15 years    Types: Cigarettes    Quit date: 04/28/2004  . Smokeless tobacco: Former Neurosurgeon    Quit date: 06/19/2003  . Alcohol Use: No    OB History   Grav Para Term Preterm Abortions TAB SAB Ect Mult Living                  Review of Systems  All other systems reviewed and are negative.    Allergies  Zithromax; Ace inhibitors; Ciprofloxacin; Other; Medrol; and Tetracyclines & related  Home Medications  Current Outpatient Rx  Name  Route  Sig  Dispense  Refill  . acetaminophen (TYLENOL) 500 MG tablet   Oral   Take 1,000 mg by mouth every 6 (six) hours as needed for pain.         . Cholecalciferol (D 1000) 1000 UNITS tablet   Oral   Take 1,000 Units by mouth every evening.          . diltiazem (CARDIZEM CD) 180 MG 24 hr capsule   Oral   Take 180 mg by mouth at bedtime.          [name] HYDROcodone-acetaminophen (NORCO/VICODIN) 5-325 MG per tablet   Oral   Take 1 tablet by mouth every 6 (six) hours as needed for pain.   70 tablet   0   . loratadine (CLARITIN) 10 MG tablet   Oral   Take 10 mg by mouth daily before breakfast.          . lovastatin (MEVACOR) 20 MG tablet   Oral   Take 20 mg by mouth at bedtime.         . metoprolol (LOPRESSOR) 50 MG tablet   Oral   Take 50 mg by mouth 2  (two) times daily.            BP 143/91  Pulse 103  Temp(Src) 102.4 F (39.1 C) (Oral)  Resp 16  Ht 5\' 3"  (1.6 m)  Wt 197 lb (89.359 kg)  BMI 34.91 kg/m2  SpO2 99%  Physical Exam  Nursing note and vitals reviewed. Constitutional: She is oriented to person, place, and time. She appears well-developed and well-nourished. No distress.  HENT:  Head: Normocephalic and atraumatic.  Mouth/Throat: Uvula is midline and oropharynx is clear and moist. Normal dentition. No dental caries. No posterior oropharyngeal erythema or tonsillar abscesses.  Well-healed craniotomy scar on the right without secondary signs of infection.  Eyes: EOM are normal. Pupils are equal, round, and reactive to light.  Neck: Normal range of motion. Neck supple. No rigidity. Normal range of motion present.  No meningeal signs.    Cardiovascular: Normal rate, regular rhythm and normal heart sounds.   Pulmonary/Chest: Effort normal and breath sounds normal.  Abdominal: Soft. She exhibits no distension. There is no tenderness. There is no rebound and no guarding.  Musculoskeletal: Normal range of motion.  Neurological: She is alert and oriented to person, place, and time.  5/5 strength in major muscle groups of  bilateral upper and lower extremities. Speech normal. No facial asymetry.   Skin: Skin is warm and dry.  Psychiatric: She has a normal mood and affect. Judgment normal.    ED Course  Procedures (including critical care time)  Labs Reviewed  CBC - Abnormal; Notable for the following:    WBC 11.1 (*)    All other components within normal limits  BASIC METABOLIC PANEL - Abnormal; Notable for the following:    Glucose, Bld 101 (*)    GFR calc non Af Amer 57 (*)    GFR calc Af Amer 66 (*)    All other components within normal limits   Ct Angio Head W/cm &/or Wo Cm  08/07/2012  *RADIOLOGY REPORT*  Clinical Data:  52 year old female with severe headache.  History of aneurysm clipping 06/24/2012.  History  of right ICA terminus aneurysm.  CT ANGIOGRAPHY HEAD  Technique:  Multidetector CT imaging of the head was performed using the standard protocol during bolus administration of intravenous contrast.  Multiplanar CT image  reconstructions including MIPs were obtained to evaluate the vascular anatomy.  Contrast: 80mL OMNIPAQUE IOHEXOL 350 MG/ML SOLN  Comparison:  [facility] Head CT 08/06/2012 and preoperative head CTA 06/03/2012.  Findings:  Stable orbits and scalp soft tissues.  Stable paranasal sinuses and mastoids. Stable visualized osseous structures. Sequelae of right frontotemporal craniotomy.  Stable right ICA terminus region aneurysm clip configuration. Cerebral volume is within normal limits for age.  No midline shift, ventriculomegaly, mass effect, evidence of mass lesion, intracranial hemorrhage or evidence of cortically based acute infarction.  Gray-white matter differentiation is within normal limits throughout the brain.  No abnormal enhancement identified.  Vascular Findings: Major intracranial venous structures appear patent.  Stable and patent distal vertebral arteries.  Normal right PICA. Patent vertebrobasilar junction.  No basilar stenosis.  SCA and PCA origins appear stable and within normal limits.  Bilateral PCA branches appear stable within normal limits.  Patent distal cervical ICA.  Left ICA siphon patent and within normal limits.  Stable suspected left anterior choroidal artery infundibulum.  Left ICA terminus, left MCA and ACA origins are stable and within normal limits.  Anterior communicating artery within normal limits.  Bilateral ACA branches within normal limits. Left MCA branches stable and within normal limits.  Right ICA siphon is patent.  Right ophthalmic artery origin within normal limits.  Mild streak artifact related to the ICA terminus aneurysm clip.  No definite residual enhancement of the aneurysm. Right MCA origin remains patent.  Right ACA origin difficult to  visualize.  Right ACA A1 segment now appears somewhat diminutive. Right MCA M1 segment appears stable.  Right MCA branches are stable and within normal limits.   Review of the MIP images confirms the above findings.  IMPRESSION:  1.  Status post right ICA terminus aneurysm clipping.  No residual aneurysm filling identified. Digital subtraction angiogram follow-up would be more sensitive. 2.  Diminutive postoperative appearance of the right ACA A1 segment, however, stable right ACA branch enhancement.  Right ACA probably now mostly supplied via the anterior communicating artery. 3.  Otherwise stable and negative intracranial CTA. 4.  Otherwise negative CT appearance of the brain.   Original Report Authenticated By: Erskine Speed, M.D.    I personally reviewed the imaging tests through PACS system I reviewed available ER/hospitalization records through the EMR   No diagnosis found.    MDM  Patient presented with fever 102.4.  She has no signs of bacterial illness.  No signs of meningitis.  CT angiogram performed which demonstrates no residual aneurysm or developing pseudoaneurysm.  Noncontrasted head CT within normal limits without abnormal fluid collection.  No gross sinus disease.  Doubt pneumonia.  Doubt urinary tract infection.  Likely viral syndrome with headache secondary to fever.  Close PCP followup.  10:39 PM Patient feels better at this time.  Discharge home with PCP followup.  She understands to return to the ER for new or worsening symptoms      Lyanne Co, MD 08/07/12 2239

## 2012-08-10 ENCOUNTER — Emergency Department (HOSPITAL_COMMUNITY): Payer: BC Managed Care – PPO

## 2012-08-10 ENCOUNTER — Encounter (HOSPITAL_COMMUNITY): Payer: Self-pay | Admitting: *Deleted

## 2012-08-10 ENCOUNTER — Inpatient Hospital Stay (HOSPITAL_COMMUNITY)
Admission: EM | Admit: 2012-08-10 | Discharge: 2012-08-25 | DRG: 533 | Disposition: A | Discharge status: Home / Self Care | Payer: BC Managed Care – PPO | Attending: Neurosurgery | Admitting: Neurosurgery

## 2012-08-10 DIAGNOSIS — Z87891 Personal history of nicotine dependence: Secondary | ICD-10-CM

## 2012-08-10 DIAGNOSIS — R509 Fever, unspecified: Secondary | ICD-10-CM

## 2012-08-10 DIAGNOSIS — G009 Bacterial meningitis, unspecified: Principal | ICD-10-CM | POA: Diagnosis present

## 2012-08-10 DIAGNOSIS — I129 Hypertensive chronic kidney disease with stage 1 through stage 4 chronic kidney disease, or unspecified chronic kidney disease: Secondary | ICD-10-CM | POA: Diagnosis present

## 2012-08-10 DIAGNOSIS — R51 Headache: Secondary | ICD-10-CM

## 2012-08-10 DIAGNOSIS — Z6834 Body mass index (BMI) 34.0-34.9, adult: Secondary | ICD-10-CM

## 2012-08-10 DIAGNOSIS — Z79899 Other long term (current) drug therapy: Secondary | ICD-10-CM

## 2012-08-10 DIAGNOSIS — N179 Acute kidney failure, unspecified: Secondary | ICD-10-CM | POA: Diagnosis not present

## 2012-08-10 DIAGNOSIS — G039 Meningitis, unspecified: Secondary | ICD-10-CM | POA: Diagnosis present

## 2012-08-10 DIAGNOSIS — E785 Hyperlipidemia, unspecified: Secondary | ICD-10-CM | POA: Diagnosis present

## 2012-08-10 DIAGNOSIS — K219 Gastro-esophageal reflux disease without esophagitis: Secondary | ICD-10-CM | POA: Diagnosis present

## 2012-08-10 DIAGNOSIS — E669 Obesity, unspecified: Secondary | ICD-10-CM | POA: Diagnosis present

## 2012-08-10 DIAGNOSIS — N189 Chronic kidney disease, unspecified: Secondary | ICD-10-CM | POA: Diagnosis present

## 2012-08-10 DIAGNOSIS — D649 Anemia, unspecified: Secondary | ICD-10-CM | POA: Diagnosis present

## 2012-08-10 LAB — CBC WITH DIFFERENTIAL/PLATELET
Eosinophils Absolute: 0 10*3/uL (ref 0.0–0.7)
Hemoglobin: 10.8 g/dL — ABNORMAL LOW (ref 12.0–15.0)
Lymphocytes Relative: 15 % (ref 12–46)
Lymphs Abs: 1.7 10*3/uL (ref 0.7–4.0)
MCH: 28.1 pg (ref 26.0–34.0)
Monocytes Relative: 4 % (ref 3–12)
Neutrophils Relative %: 81 % — ABNORMAL HIGH (ref 43–77)
Platelets: 376 10*3/uL (ref 150–400)
RBC: 3.84 MIL/uL — ABNORMAL LOW (ref 3.87–5.11)
WBC: 11.3 10*3/uL — ABNORMAL HIGH (ref 4.0–10.5)

## 2012-08-10 LAB — URINALYSIS, ROUTINE W REFLEX MICROSCOPIC
Bilirubin Urine: NEGATIVE
Leukocytes, UA: NEGATIVE
Nitrite: NEGATIVE
Protein, ur: NEGATIVE mg/dL
pH: 7.5 (ref 5.0–8.0)

## 2012-08-10 LAB — COMPREHENSIVE METABOLIC PANEL
ALT: 7 U/L (ref 0–35)
Alkaline Phosphatase: 63 U/L (ref 39–117)
BUN: 6 mg/dL (ref 6–23)
CO2: 27 mEq/L (ref 19–32)
Chloride: 98 mEq/L (ref 96–112)
GFR calc Af Amer: 74 mL/min — ABNORMAL LOW (ref >90)
GFR calc non Af Amer: 64 mL/min — ABNORMAL LOW (ref >90)
Glucose, Bld: 100 mg/dL — ABNORMAL HIGH (ref 70–99)
Potassium: 3.4 mEq/L — ABNORMAL LOW (ref 3.5–5.1)
Sodium: 136 mEq/L (ref 135–145)
Total Bilirubin: 0.4 mg/dL (ref 0.3–1.2)

## 2012-08-10 MED ORDER — ACETAMINOPHEN 500 MG PO TABS: 1000.0000 mg | ORAL_TABLET | Freq: Once | ORAL | Status: AC

## 2012-08-10 MED ORDER — ONDANSETRON HCL 4 MG/2ML IJ SOLN
4.0000 mg | Freq: Once | INTRAMUSCULAR | Status: AC
  Administered 2012-08-10: 4 mg via INTRAVENOUS
  Filled 2012-08-10: qty 2

## 2012-08-10 MED ORDER — MORPHINE SULFATE 4 MG/ML IJ SOLN: 4.0000 mg | Freq: Once | INTRAMUSCULAR | Status: DC

## 2012-08-10 MED ORDER — LIDOCAINE-EPINEPHRINE 2 %-1:100000 IJ SOLN: 20.0000 mL | Freq: Once | INTRAMUSCULAR | Status: DC

## 2012-08-10 MED ORDER — LORAZEPAM 2 MG/ML IJ SOLN
1.0000 mg | Freq: Once | INTRAMUSCULAR | Status: AC
  Administered 2012-08-10: 1 mg via INTRAVENOUS
  Filled 2012-08-10: qty 1

## 2012-08-10 MED ORDER — ONDANSETRON HCL 4 MG/2ML IJ SOLN: 4.0000 mg | Freq: Once | INTRAMUSCULAR | Status: DC

## 2012-08-10 MED ORDER — ONDANSETRON HCL 4 MG/2ML IJ SOLN
INTRAMUSCULAR | Status: AC
  Filled 2012-08-10: qty 2

## 2012-08-10 MED ORDER — SODIUM CHLORIDE 0.9 % IV BOLUS (SEPSIS)
1000.0000 mL | Freq: Once | INTRAVENOUS | Status: AC
  Administered 2012-08-24: 1000 mL via INTRAVENOUS

## 2012-08-10 MED ORDER — HYDROMORPHONE HCL PF 1 MG/ML IJ SOLN
1.0000 mg | Freq: Once | INTRAMUSCULAR | Status: AC
  Administered 2012-08-10: 1 mg via INTRAVENOUS
  Filled 2012-08-10: qty 1

## 2012-08-10 MED ORDER — ONDANSETRON HCL 4 MG/2ML IJ SOLN
4.0000 mg | Freq: Once | INTRAMUSCULAR | Status: AC
  Administered 2012-08-10: 4 mg via INTRAVENOUS

## 2012-08-10 MED ORDER — ACETAMINOPHEN 500 MG PO TABS
ORAL_TABLET | ORAL | Status: AC
  Administered 2012-08-10: 1000 mg via ORAL
  Filled 2012-08-10: qty 2

## 2012-08-10 MED ORDER — MORPHINE SULFATE 4 MG/ML IJ SOLN
4.0000 mg | Freq: Once | INTRAMUSCULAR | Status: AC
  Administered 2012-08-10: 4 mg via INTRAVENOUS
  Filled 2012-08-10: qty 1

## 2012-08-10 MED ORDER — LIDOCAINE-EPINEPHRINE (PF) 2 %-1:200000 IJ SOLN
INTRAMUSCULAR | Status: AC
  Administered 2012-08-10: 20:00:00
  Filled 2012-08-10: qty 20

## 2012-08-10 MED ORDER — SODIUM CHLORIDE 0.9 % IV SOLN: Freq: Once | INTRAVENOUS | Status: DC

## 2012-08-10 NOTE — ED Provider Notes (Signed)
History  This chart was scribed for Donnetta Hutching, MD by [name], ED Scribe. This patient was seen in room APA02/APA02 and the patient's care was started at 3:41 PM.  CSN: [id number]  Arrival date & time 08/10/12  1529   First MD Initiated Contact with Patient 08/10/12 1541      Chief Complaint  Patient presents with  . Fever    The history is provided by the patient. No language interpreter was used.    [name] [name] is a 52 y.o. female who presents to the Emergency Department complaining of 5 days of gradual onset, gradually worsening, constant fever of 102.4 with associated frontal and bilateral HA described as severe throbbing that radiates down the back of her neck, nausea, chills, neck stiffness and urinary frequency. She reports that ambulation aggravates her pain and that rest mildly improves her symptoms. She had an aneurysm repair surgery done on 06/24/12 performed by Dr. Mikal Plane. She has since followed up with Dr. Mikal Plane with a normal angio during a routine visit and denies any complications until 5 days ago. She states that she was seen at [facility] 4 days ago and discharged after a negative CT of the head. She was then seen 3 days ago in this ED and was diagnosed with a viral infection after a negative work up and repeat CT of the head.  She states that she has still been having increasingly worse HAs since then. She states that she has tried Zofran, last dose was at 8 AM today, as well as 5-325 mg oxycodone and tylenol with no improvement.  She denies having a h/o DM and cardiac disease. She denies diarrhea, emesis, numbness, weakness and visual disturbance as associated symptoms. She has a h/o stable tachycardia, HTN, HLD, and GERD. She is a former smoker but denies alcohol use.  Almond Lint is PCP  Past Medical History  Diagnosis Date  . Overweight     Obesity  . Hypertension   . Hyperlipidemia   . Palpitations   . Chest pain     denied on 06/18/2012  .  Hypoglycemia   . Uterine fibroid     pt. states that she has a "closed cervix"   . Sinusitis, acute     seen by PCP- 06/17/2012, will treat /w augmentin & tussin/DM  . H/O hiatal hernia   . Headache     using tylenol prn  . Aneurysm     brain  . Normal cardiac stress test     pt. released fr. [name] care in 10/2011, all findings w/i normal limits  . H. pylori infection     2012- stomach biopsy  . GERD (gastroesophageal reflux disease)     h/o colitis   . Family history of anesthesia complication     brother & neice "flat lined" during induction: neice d/t a medication given for a blood clotting problem; brother d/t "miscalculated amount of anesthesia"  . Chronic kidney disease     L - upper pole- defect, doesn't effect       Past Surgical History  Procedure Laterality Date  . Colonoscopy    . Lipoma excision      buttock 10/2010  . Craniotomy Right 06/24/2012    Procedure: CRANIOTOMY INTRACRANIAL ANEURYSM FOR CAROTID;  Surgeon: Carmela Hurt, MD;  Location: [facility];  Service: Neurosurgery;  Laterality: Right;  RIGHT Pterional Craniotomy for aneurysm    Family History  Problem Relation Age of Onset  . Hypertension Other   .  Coronary artery disease Other     History  Substance Use Topics  . Smoking status: Former Smoker -- 0.80 packs/day for 15 years    Types: Cigarettes    Quit date: 04/28/2004  . Smokeless tobacco: Former Neurosurgeon    Quit date: 06/19/2003  . Alcohol Use: No    No OB history provided.  Review of Systems  A complete 10 system review of systems was obtained and all systems are negative except as noted in the HPI and PMH.   Allergies  Zithromax; Ace inhibitors; Ciprofloxacin; Other; Medrol; and Tetracyclines & related  Home Medications   Current Outpatient Rx  Name  Route  Sig  Dispense  Refill  . acetaminophen (TYLENOL) 500 MG tablet   Oral   Take 1,000 mg by mouth every 6 (six) hours as needed for pain.         . Cholecalciferol (D  1000) 1000 UNITS tablet   Oral   Take 1,000 Units by mouth every evening.          . diltiazem (CARDIZEM CD) 180 MG 24 hr capsule   Oral   Take 180 mg by mouth at bedtime.          [name] HYDROcodone-acetaminophen (NORCO/VICODIN) 5-325 MG per tablet   Oral   Take 1 tablet by mouth every 6 (six) hours as needed for pain.   70 tablet   0   . loratadine (CLARITIN) 10 MG tablet   Oral   Take 10 mg by mouth daily before breakfast.          . lovastatin (MEVACOR) 20 MG tablet   Oral   Take 20 mg by mouth at bedtime.         . metoprolol (LOPRESSOR) 50 MG tablet   Oral   Take 50 mg by mouth 2 (two) times daily.          . ondansetron (ZOFRAN ODT) 8 MG disintegrating tablet   Oral   Take 1 tablet (8 mg total) by mouth every 8 (eight) hours as needed for nausea.   10 tablet   0   . oxyCODONE-acetaminophen (PERCOCET/ROXICET) 5-325 MG per tablet   Oral   Take 1 tablet by mouth every 4 (four) hours as needed for pain.   20 tablet   0     Triage Vitals: BP 142/72  Pulse 90  Temp(Src) 102.2 F (39 C) (Oral)  Resp 20  Ht 5\' 3"  (1.6 m)  Wt 193 lb (87.544 kg)  BMI 34.2 kg/m2  SpO2 100%  Physical Exam  Nursing note and vitals reviewed. Constitutional: She is oriented to person, place, and time. She appears well-developed and well-nourished.  HENT:  Head: Normocephalic and atraumatic.  elliptical well-healing surgical scar to top of right forehead to right ear, no erythema, no drainage, no scalp tenderness or trauma  Eyes: Conjunctivae and EOM are normal. Pupils are equal, round, and reactive to light.  Neck: Normal range of motion. Neck supple.  No meningismus   Cardiovascular: Normal rate, regular rhythm and normal heart sounds.   Pulmonary/Chest: Effort normal and breath sounds normal.  Abdominal: Soft. Bowel sounds are normal.  Musculoskeletal: Normal range of motion.  Neurological: She is alert and oriented to person, place, and time.  Skin: Skin is warm and  dry.  Psychiatric: She has a normal mood and affect.    ED Course  Procedures (including critical care time)  DIAGNOSTIC STUDIES: Oxygen Saturation is 100% on room air, normal  by my interpretation.    COORDINATION OF CARE: 4:13 PM-Discussed treatment plan which includes MRI of head with pt at bedside and pt agreed to plan.   Labs Reviewed  CBC WITH DIFFERENTIAL - Abnormal; Notable for the following:    WBC 11.3 (*)    RBC 3.84 (*)    Hemoglobin 10.8 (*)    HCT 32.3 (*)    Neutrophils Relative 81 (*)    Neutro Abs 9.2 (*)    All other components within normal limits  COMPREHENSIVE METABOLIC PANEL - Abnormal; Notable for the following:    Potassium 3.4 (*)    Glucose, Bld 100 (*)    GFR calc non Af Amer 64 (*)    GFR calc Af Amer 74 (*)    All other components within normal limits  URINALYSIS, ROUTINE W REFLEX MICROSCOPIC - Abnormal; Notable for the following:    Ketones, ur TRACE (*)    All other components within normal limits  LACTIC ACID, PLASMA   No results found.      Procedure note:    Under sterile conditions, a spinal needle was introduced into the L3-4 interspace.   Needle appeared to be in appropriate space. No fluid obtained. Needle withdrawn.  Vital signs are stable. No lower extremity paresthesias  No diagnosis found.  CRITICAL CARE Performed by: Donnetta Hutching   Total critical care time: 45  Critical care time was exclusive of separately billable procedures and treating other patients.  Critical care was necessary to treat or prevent imminent or life-threatening deterioration.  Critical care was time spent personally by me on the following activities: development of treatment plan with patient and/or surrogate as well as nursing, discussions with consultants, evaluation of patient's response to treatment, examination of patient, obtaining history from patient or surrogate, ordering and performing treatments and interventions, ordering and review of  laboratory studies, ordering and review of radiographic studies, pulse oximetry and re-evaluation of patient's condition.  MDM  Status post aneurysm repair on February 26 by Dr. Fabiola Backer.  Third emergency department visit since this past Wednesday. Now complains of fever, headache, sore neck.   Patient does not appear toxic. Feels better after IV hydration and pain management. Lumbar puncture failed. Discussed with Dr. Francene Boyers radiologist who will accept patient to perform fluoroscopy-guided LP.   Also discussed with colleague Dr. Nelva Nay.  Patient understands purpose for lumbar puncture, ie rule out meningitis      I personally performed the services described in this documentation, which was scribed in my presence. The recorded information has been reviewed and is accurate.    Donnetta Hutching, MD 08/12/12 1058

## 2012-08-10 NOTE — ED Notes (Signed)
In room assessing dr Adriana Simas with LP at this time

## 2012-08-10 NOTE — ED Provider Notes (Signed)
Patient transferred from [facility] hospital to [facility] for LP under fluoroscopy because of fever and headache for the last few days.  Patient has recently had a cranial aneurysm repair in February.  She says the fevers been present for 5 days and she's been to emergency departments 2-3 times.  A negative CT scan however her symptoms have continued.  Plan obtain spinal fluid under fluoroscopy and disposition depending upon results.  See Dr. Patsey Berthold H&P which was done just prior to arrival here.  Nelia Shi, MD 08/10/12 2228

## 2012-08-10 NOTE — ED Notes (Signed)
Patient states that she recently had cranial aneurysm repair on June 24, 2012, states that she has had a headache and fever x5 days, states that she was seen here Friday night and had a negative CT scan, however, her symptoms have continued.  States that her fever has been 102.4 and her headache pain has increased, also has intermittent dizziness with ambulation.

## 2012-08-10 NOTE — ED Notes (Signed)
Radiology called nurse and stated that pt was having anxiety due to procedure. RN to request ativan from MD.

## 2012-08-10 NOTE — ED Notes (Signed)
Patient transported to X-ray directly from [facility]

## 2012-08-10 NOTE — ED Notes (Signed)
Pt has now left with [facility] for transport to [facility].

## 2012-08-10 NOTE — ED Notes (Signed)
[facility] at [facility] ED given report, Is aware that pt is being transported to radiology then will come back to ED to await results.

## 2012-08-10 NOTE — ED Notes (Signed)
Fever, nausea,  Voiding frequently,  Chills,  Back of neck "stiff".    Brain aneurysm repair has been done.  Seen here Friday and had head ct.

## 2012-08-10 NOTE — ED Notes (Addendum)
Dr Adriana Simas unsuccessful at obtaining spinal fluid via LP. To call [facility]. Pt tolerated procedure very well, consent has been gotten

## 2012-08-11 DIAGNOSIS — G039 Meningitis, unspecified: Secondary | ICD-10-CM | POA: Diagnosis present

## 2012-08-11 LAB — CSF CELL COUNT WITH DIFFERENTIAL
Eosinophils, CSF: 0 % (ref 0–1)
Monocyte-Macrophage-Spinal Fluid: 6 % — ABNORMAL LOW (ref 15–45)
RBC Count, CSF: 0 /mm3
Segmented Neutrophils-CSF: 82 % — ABNORMAL HIGH (ref 0–6)
WBC, CSF: 1549 /mm3 (ref 0–5)

## 2012-08-11 LAB — GRAM STAIN

## 2012-08-11 LAB — PATHOLOGIST SMEAR REVIEW: Path Review: NONE SEEN

## 2012-08-11 MED ORDER — ACETAMINOPHEN 650 MG RE SUPP: 650.0000 mg | Freq: Four times a day (QID) | RECTAL | Status: DC | PRN

## 2012-08-11 MED ORDER — DEXTROSE 5 % IV SOLN
2.0000 g | Freq: Two times a day (BID) | INTRAVENOUS | Status: DC
  Administered 2012-08-11 – 2012-08-24 (×26): 2 g via INTRAVENOUS
  Filled 2012-08-11 (×29): qty 2

## 2012-08-11 MED ORDER — OXYCODONE HCL 5 MG PO TABS
5.0000 mg | ORAL_TABLET | ORAL | Status: DC | PRN
  Administered 2012-08-11 – 2012-08-17 (×17): 5 mg via ORAL
  Filled 2012-08-11 (×18): qty 1

## 2012-08-11 MED ORDER — ACYCLOVIR SODIUM 50 MG/ML IV SOLN
700.0000 mg | INTRAVENOUS | Status: AC
  Administered 2012-08-11: 700 mg via INTRAVENOUS
  Filled 2012-08-11: qty 14

## 2012-08-11 MED ORDER — SODIUM CHLORIDE 0.9 % IJ SOLN
10.0000 mL | INTRAMUSCULAR | Status: DC | PRN
  Administered 2012-08-15 – 2012-08-18 (×7): 10 mL
  Administered 2012-08-18: 20 mL
  Administered 2012-08-19 – 2012-08-25 (×5): 10 mL

## 2012-08-11 MED ORDER — DILTIAZEM HCL ER COATED BEADS 180 MG PO CP24
180.0000 mg | ORAL_CAPSULE | Freq: Every day | ORAL | Status: DC
  Administered 2012-08-11 – 2012-08-24 (×14): 180 mg via ORAL
  Filled 2012-08-11 (×15): qty 1

## 2012-08-11 MED ORDER — ACETAMINOPHEN 325 MG PO TABS
650.0000 mg | ORAL_TABLET | Freq: Four times a day (QID) | ORAL | Status: DC | PRN
  Administered 2012-08-12 – 2012-08-25 (×15): 650 mg via ORAL
  Filled 2012-08-11 (×15): qty 2

## 2012-08-11 MED ORDER — VANCOMYCIN HCL IN DEXTROSE 1-5 GM/200ML-% IV SOLN
1000.0000 mg | Freq: Once | INTRAVENOUS | Status: AC
  Administered 2012-08-11: 1000 mg via INTRAVENOUS
  Filled 2012-08-11: qty 200

## 2012-08-11 MED ORDER — DEXAMETHASONE SODIUM PHOSPHATE 4 MG/ML IJ SOLN
4.0000 mg | Freq: Four times a day (QID) | INTRAMUSCULAR | Status: DC
  Administered 2012-08-11 – 2012-08-19 (×33): 4 mg via INTRAVENOUS
  Filled 2012-08-11 (×36): qty 1

## 2012-08-11 MED ORDER — VANCOMYCIN HCL 10 G IV SOLR
1250.0000 mg | Freq: Two times a day (BID) | INTRAVENOUS | Status: DC
  Administered 2012-08-11 – 2012-08-13 (×6): 1250 mg via INTRAVENOUS
  Filled 2012-08-11 (×7): qty 1250

## 2012-08-11 MED ORDER — SODIUM CHLORIDE 0.9 % IV SOLN
INTRAVENOUS | Status: AC
  Administered 2012-08-11: 1000 mL via INTRAVENOUS

## 2012-08-11 MED ORDER — HYDROMORPHONE HCL PF 1 MG/ML IJ SOLN: 0.5000 mg | INTRAMUSCULAR | Status: DC | PRN

## 2012-08-11 MED ORDER — DEXTROSE 5 % IV SOLN
2.0000 g | Freq: Once | INTRAVENOUS | Status: AC
  Administered 2012-08-11: 2 g via INTRAVENOUS
  Filled 2012-08-11: qty 2

## 2012-08-11 MED ORDER — ONDANSETRON HCL 4 MG/2ML IJ SOLN
4.0000 mg | Freq: Three times a day (TID) | INTRAMUSCULAR | Status: AC | PRN
  Administered 2012-08-11: 4 mg via INTRAVENOUS
  Filled 2012-08-11: qty 2

## 2012-08-11 MED ORDER — ONDANSETRON 4 MG PO TBDP
8.0000 mg | ORAL_TABLET | Freq: Three times a day (TID) | ORAL | Status: DC | PRN
  Administered 2012-08-12 – 2012-08-18 (×3): 8 mg via ORAL
  Filled 2012-08-11 (×3): qty 2

## 2012-08-11 MED ORDER — METOPROLOL TARTRATE 50 MG PO TABS
50.0000 mg | ORAL_TABLET | Freq: Two times a day (BID) | ORAL | Status: DC
  Administered 2012-08-11 – 2012-08-24 (×23): 50 mg via ORAL
  Filled 2012-08-11 (×28): qty 1

## 2012-08-11 MED ORDER — SIMVASTATIN 10 MG PO TABS
10.0000 mg | ORAL_TABLET | Freq: Every day | ORAL | Status: DC
  Administered 2012-08-11 – 2012-08-24 (×13): 10 mg via ORAL
  Filled 2012-08-11 (×15): qty 1

## 2012-08-11 MED ORDER — DEXTROSE 5 % IV SOLN
700.0000 mg | Freq: Three times a day (TID) | INTRAVENOUS | Status: DC
  Administered 2012-08-11 – 2012-08-16 (×14): 700 mg via INTRAVENOUS
  Filled 2012-08-11 (×19): qty 14

## 2012-08-11 MED ORDER — LORATADINE 10 MG PO TABS
10.0000 mg | ORAL_TABLET | Freq: Every day | ORAL | Status: DC
  Administered 2012-08-12 – 2012-08-25 (×14): 10 mg via ORAL
  Filled 2012-08-11 (×16): qty 1

## 2012-08-11 NOTE — Progress Notes (Signed)
Asked to 2nd assess PIV start - unable to access either site palpated. Spoke with patient about PICC (Vancomycin/Zosyn) -spoke with patient RN about informing MD of no access

## 2012-08-11 NOTE — Progress Notes (Signed)
ANTIBIOTIC CONSULT NOTE - INITIAL  Pharmacy Consult for Vancomycin Indication: r/o meningitis  Allergies  Allergen Reactions  . Zithromax (Azithromycin) Anaphylaxis  . Ace Inhibitors Swelling  . Ciprofloxacin Nausea And Vomiting    Also: kidney failure   . Other Swelling, Other (See Comments) and Cough    Sneezing allergy to cats  . Medrol (Methylprednisolone) Palpitations    Heart racing, increased BP  . Tetracyclines & Related Palpitations    Heart racing, increased bp    Patient Measurements: Height: 5\' 3"  (160 cm) Weight: 196 lb (88.905 kg) IBW/kg (Calculated) : 52.4   Vital Signs: Temp: 97.8 F (36.6 C) (04/15 0926) Temp src: Oral (04/15 0926) BP: 127/72 mmHg (04/15 0926) Pulse Rate: 86 (04/15 0926)  Labs:  Recent Labs  08/10/12 1701  WBC 11.3*  HGB 10.8*  PLT 376  CREATININE 1.00   Estimated Creatinine Clearance: 70.4 ml/min (by C-G formula based on Cr of 1).   Microbiology: Recent Results (from the past 720 hour(s))  CSF CULTURE     Status: None   Collection Time    08/10/12 11:10 PM      Result Value Range Status   Specimen Description CSF   Final   Special Requests NO 2 4.5CC   Final   Gram Stain     Final   Value: WBC PRESENT,BOTH PMN AND MONONUCLEAR     NO ORGANISMS SEEN     CYTOSPIN SLIDE Performed at [facility]   Culture PENDING   Incomplete   Report Status PENDING   Incomplete  GRAM STAIN     Status: None   Collection Time    08/10/12 11:10 PM      Result Value Range Status   Specimen Description CSF   Final   Special Requests NO 2 4.5CC   Final   Gram Stain     Final   Value: CYTOSPIN CSF     WBC PRESENT,BOTH PMN AND MONONUCLEAR     NO ORGANISMS SEEN   Report Status 08/11/2012 FINAL   Final    Medical History: Past Medical History  Diagnosis Date  . Overweight     Obesity  . Hypertension   . Hyperlipidemia   . Palpitations   . Chest pain     denied on 06/18/2012  . Hypoglycemia   . Uterine fibroid     pt.  states that she has a "closed cervix"   . Sinusitis, acute     seen by PCP- 06/17/2012, will treat /w augmentin & tussin/DM  . H/O hiatal hernia   . Headache     using tylenol prn  . Aneurysm     brain  . Normal cardiac stress test     pt. released fr. [name] care in 10/2011, all findings w/i normal limits  . H. pylori infection     2012- stomach biopsy  . GERD (gastroesophageal reflux disease)     h/o colitis   . Family history of anesthesia complication     brother & neice "flat lined" during induction: neice d/t a medication given for a blood clotting problem; brother d/t "miscalculated amount of anesthesia"  . 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 Admitted with 4-5 day h/o worsening headaches. Mild photophobia & neck stiffness, low grade fever, occasional nausea & vomiting.  Started on  IV Vancomycin,  ceftriaxione and acyclovir for r/o meningtitis. IV Vancomycin 1000 mg given @ 01:21 this AM.   Weight 88.9 kg,  Scr = 1.0,  Estimated Crcl ~ 70 ml/min  Goal:  Vancomycin trough level 15-20 mcg/ml  Plan:  Vancomycin 1250 mg IV q12h  Check vancomycin trough at steady stat.  Monitor CBC, Cx, clinical progression.  Noah Delaine, RPh Clinical Pharmacist Pager: [phone] 08/11/2012,9:48 AM

## 2012-08-11 NOTE — ED Provider Notes (Signed)
Care assumed from prior team.  Pt with 5 days of fever, headache.  Pt was transferred from [facility] ED for LP under fluoro.  LP results showed WBC 1549, no RBC.  D/w Dr Jordan Likes, on call for Dr Franky Macho.  He will admit.  Will start abx, blood cultures ordered.  Vancomycin, Rocephin, Acyclovir ordered.  Results for orders placed during the hospital encounter of 08/10/12  [facility] STAIN      Result Value Range   Specimen Description CSF     Special Requests NO 2 4.5CC     Gram Stain       Value: CYTOSPIN CSF     WBC PRESENT,BOTH PMN AND MONONUCLEAR     NO ORGANISMS SEEN   Report Status 08/11/2012 FINAL    CBC WITH DIFFERENTIAL      Result Value Range   WBC 11.3 (*) 4.0 - 10.5 K/uL   RBC 3.84 (*) 3.87 - 5.11 MIL/uL   Hemoglobin 10.8 (*) 12.0 - 15.0 g/dL   HCT 40.9 (*) 81.1 - 91.4 %   MCV 84.1  78.0 - 100.0 fL   MCH 28.1  26.0 - 34.0 pg   MCHC 33.4  30.0 - 36.0 g/dL   RDW 78.2  95.6 - 21.3 %   Platelets 376  150 - 400 K/uL   Neutrophils Relative 81 (*) 43 - 77 %   Neutro Abs 9.2 (*) 1.7 - 7.7 K/uL   Lymphocytes Relative 15  12 - 46 %   Lymphs Abs 1.7  0.7 - 4.0 K/uL   Monocytes Relative 4  3 - 12 %   Monocytes Absolute 0.4  0.1 - 1.0 K/uL   Eosinophils Relative 0  0 - 5 %   Eosinophils Absolute 0.0  0.0 - 0.7 K/uL   Basophils Relative 0  0 - 1 %   Basophils Absolute 0.0  0.0 - 0.1 K/uL  COMPREHENSIVE METABOLIC PANEL      Result Value Range   Sodium 136  135 - 145 mEq/L   Potassium 3.4 (*) 3.5 - 5.1 mEq/L   Chloride 98  96 - 112 mEq/L   CO2 27  19 - 32 mEq/L   Glucose, Bld 100 (*) 70 - 99 mg/dL   BUN 6  6 - 23 mg/dL   Creatinine, Ser 0.86  0.50 - 1.10 mg/dL   Calcium 9.6  8.4 - 57.8 mg/dL   Total Protein 7.5  6.0 - 8.3 g/dL   Albumin 3.8  3.5 - 5.2 g/dL   AST 17  0 - 37 U/L   ALT 7  0 - 35 U/L   Alkaline Phosphatase 63  39 - 117 U/L   Total Bilirubin 0.4  0.3 - 1.2 mg/dL   GFR calc non Af Amer 64 (*) >90 mL/min   GFR calc Af Amer 74 (*) >90 mL/min  URINALYSIS, ROUTINE W REFLEX  MICROSCOPIC      Result Value Range   Color, Urine YELLOW  YELLOW   APPearance CLEAR  CLEAR   Specific Gravity, Urine 1.010  1.005 - 1.030   pH 7.5  5.0 - 8.0   Glucose, UA NEGATIVE  NEGATIVE mg/dL   Hgb urine dipstick NEGATIVE  NEGATIVE   Bilirubin Urine NEGATIVE  NEGATIVE   Ketones, ur TRACE (*) NEGATIVE mg/dL   Protein, ur NEGATIVE  NEGATIVE mg/dL   Urobilinogen, UA 0.2  0.0 - 1.0 mg/dL   Nitrite NEGATIVE  NEGATIVE   Leukocytes, UA NEGATIVE  NEGATIVE  LACTIC  ACID, PLASMA      Result Value Range   Lactic Acid, Venous 1.0  0.5 - 2.2 mmol/L  CSF CELL COUNT WITH DIFFERENTIAL      Result Value Range   Tube # 3     Color, CSF COLORLESS  COLORLESS   Appearance, CSF HAZY (*) CLEAR   Supernatant NOT INDICATED     RBC Count, CSF 0  0 /cu mm   WBC, CSF 1549 (*) 0 - 5 /cu mm   Segmented Neutrophils-CSF 82 (*) 0 - 6 %   Lymphs, CSF 12 (*) 40 - 80 %   Monocyte-Macrophage-Spinal Fluid 6 (*) 15 - 45 %   Eosinophils, CSF 0  0 - 1 %  PROTEIN, CSF      Result Value Range   Total  Protein, CSF 55 (*) 15 - 45 mg/dL  GLUCOSE, CSF      Result Value Range   Glucose, CSF 51  43 - 76 mg/dL   Ct Angio Head W/cm &/or Wo Cm  08/07/2012  *RADIOLOGY REPORT*  Clinical Data:  52 year old female with severe headache.  History of aneurysm clipping 06/24/2012.  History of right ICA terminus aneurysm.  CT ANGIOGRAPHY HEAD  Technique:  Multidetector CT imaging of the head was performed using the standard protocol during bolus administration of intravenous contrast.  Multiplanar CT image reconstructions including MIPs were obtained to evaluate the vascular anatomy.  Contrast: 80mL OMNIPAQUE IOHEXOL 350 MG/ML SOLN  Comparison:  [facility] Head CT 08/06/2012 and preoperative head CTA 06/03/2012.  Findings:  Stable orbits and scalp soft tissues.  Stable paranasal sinuses and mastoids. Stable visualized osseous structures. Sequelae of right frontotemporal craniotomy.  Stable right ICA terminus region  aneurysm clip configuration. Cerebral volume is within normal limits for age.  No midline shift, ventriculomegaly, mass effect, evidence of mass lesion, intracranial hemorrhage or evidence of cortically based acute infarction.  Gray-white matter differentiation is within normal limits throughout the brain.  No abnormal enhancement identified.  Vascular Findings: Major intracranial venous structures appear patent.  Stable and patent distal vertebral arteries.  Normal right PICA. Patent vertebrobasilar junction.  No basilar stenosis.  SCA and PCA origins appear stable and within normal limits.  Bilateral PCA branches appear stable within normal limits.  Patent distal cervical ICA.  Left ICA siphon patent and within normal limits.  Stable suspected left anterior choroidal artery infundibulum.  Left ICA terminus, left MCA and ACA origins are stable and within normal limits.  Anterior communicating artery within normal limits.  Bilateral ACA branches within normal limits. Left MCA branches stable and within normal limits.  Right ICA siphon is patent.  Right ophthalmic artery origin within normal limits.  Mild streak artifact related to the ICA terminus aneurysm clip.  No definite residual enhancement of the aneurysm. Right MCA origin remains patent.  Right ACA origin difficult to visualize.  Right ACA A1 segment now appears somewhat diminutive. Right MCA M1 segment appears stable.  Right MCA branches are stable and within normal limits.   Review of the MIP images confirms the above findings.  IMPRESSION:  1.  Status post right ICA terminus aneurysm clipping.  No residual aneurysm filling identified. Digital subtraction angiogram follow-up would be more sensitive. 2.  Diminutive postoperative appearance of the right ACA A1 segment, however, stable right ACA branch enhancement.  Right ACA probably now mostly supplied via the anterior communicating artery. 3.  Otherwise stable and negative intracranial CTA. 4.  Otherwise  negative CT appearance  of the brain.   Original Report Authenticated By: Erskine Speed, M.D.    Dg Lumbar Puncture Fluoro Guide  08/11/2012  *RADIOLOGY REPORT*  Clinical Data: Headache.  Fever.  Recent aneurysm repair.  LUMBAR PUNCTURE FLUORO GUIDE  Fluoroscopy time:  16 seconds.  Comparison: None.  Findings: After explaining the purpose, procedure, risks, including bleeding, infection, medication reaction, and headache, and after obtaining written informed consent, using sterile technique and procedure I performed a lumbar puncture at the L3-4 level with a 20 gauge spinal needle using direct fluoroscopic guidance.  Clear colorless spinal fluid was obtained for laboratory examination. Opening pressure was 30 cm of water. The patient tolerated procedure well.  There were no immediate complications.  IMPRESSION: Lumbar puncture performed without complication.  Opening pressure was 30 cm of water.   Original Report Authenticated By: Francene Boyers, M.D.       Olivia Mackie, MD 08/11/12 [phone]

## 2012-08-11 NOTE — ED Notes (Signed)
Critical Value CSF WBC 1549

## 2012-08-11 NOTE — Progress Notes (Signed)
ANTIBIOTIC CONSULT NOTE - INITIAL  Pharmacy Consult for acyclovir Indication: r/o meningitis  Allergies  Allergen Reactions  . Zithromax (Azithromycin) Anaphylaxis  . Ace Inhibitors Swelling  . Ciprofloxacin Nausea And Vomiting    Also: kidney failure   . Other Swelling, Other (See Comments) and Cough    Sneezing allergy to cats  . Medrol (Methylprednisolone) Palpitations    Heart racing, increased BP  . Tetracyclines & Related Palpitations    Heart racing, increased bp    Patient Measurements: Height: 5\' 3"  (160 cm) Weight: 193 lb (87.544 kg) IBW/kg (Calculated) : 52.4 Adjusted Body Weight: 66kg  Vital Signs: Temp: 98.2 F (36.8 C) (04/14 2343) Temp src: Oral (04/14 2343) BP: 121/85 mmHg (04/14 2343) Pulse Rate: 73 (04/14 2343)  Labs:  Recent Labs  08/10/12 1701  WBC 11.3*  HGB 10.8*  PLT 376  CREATININE 1.00   Estimated Creatinine Clearance: 69.8 ml/min (by C-G formula based on Cr of 1).   Microbiology: Recent Results (from the past 720 hour(s))  GRAM STAIN     Status: None   Collection Time    08/10/12 11:10 PM      Result Value Range Status   Specimen Description CSF   Final   Special Requests NO 2 4.5CC   Final   Gram Stain     Final   Value: CYTOSPIN CSF     WBC PRESENT,BOTH PMN AND MONONUCLEAR     NO ORGANISMS SEEN   Report Status 08/11/2012 FINAL   Final    Medical History: Past Medical History  Diagnosis Date  . Overweight     Obesity  . Hypertension   . Hyperlipidemia   . Palpitations   . Chest pain     denied on 06/18/2012  . Hypoglycemia   . Uterine fibroid     pt. states that she has a "closed cervix"   . Sinusitis, acute     seen by PCP- 06/17/2012, will treat /w augmentin & tussin/DM  . H/O hiatal hernia   . Headache     using tylenol prn  . Aneurysm     brain  . Normal cardiac stress test     pt. released fr. [name] care in 10/2011, all findings w/i normal limits  . H. pylori infection     2012- stomach biopsy  .  GERD (gastroesophageal reflux disease)     h/o colitis   . Family history of anesthesia complication     brother & neice "flat lined" during induction: neice d/t a medication given for a blood clotting problem; brother d/t "miscalculated amount of anesthesia"  . Chronic kidney disease     L - upper pole- defect, doesn't effect       Assessment: 52yo female had recent brain aneurysm repair, now c/o fever, nausea, chills, dysuria, headaches, and stiff neck x5d, was seen in ED on Friday when CT of head was negative, Sx continued with worsening HA, spinal fluid reveals WBC at 1549, to begin multiple agents for suspected meningitis.  Plan:  Will begin acyclovir 700mg  IV Q8H and monitor CBC, Cx, clinical progression.  Vernard Gambles, PharmD, BCPS  08/11/2012,12:52 AM

## 2012-08-11 NOTE — ED Notes (Signed)
Phlebotomy at bedside.

## 2012-08-11 NOTE — Progress Notes (Signed)
Peripherally Inserted Central Catheter/Midline Placement  The IV Nurse has discussed with the patient and/or persons authorized to consent for the patient, the purpose of this procedure and the potential benefits and risks involved with this procedure.  The benefits include less needle sticks, lab draws from the catheter and patient may be discharged home with the catheter.  Risks include, but not limited to, infection, bleeding, blood clot (thrombus formation), and puncture of an artery; nerve damage and irregular heat beat.  Alternatives to this procedure were also discussed.  PICC/Midline Placement Documentation  PICC / Midline Double Lumen 08/11/12 PICC Right Brachial (Active)       [name] [name] 08/11/2012, 6:50 PM

## 2012-08-11 NOTE — ED Notes (Signed)
MD at bedside. 

## 2012-08-11 NOTE — ED Notes (Signed)
MD speaking with family and pt. 

## 2012-08-11 NOTE — H&P (Signed)
[name] [name] is an 52 y.o. female.   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 Postoperative she is done well. She presents now with 4-5 day history of worsening headaches. Patient also notes some mild photophobia and neck stiffness. Denies any history consistent with CSF leak. Low-grade fevers. Occasional nausea and vomiting. Denies the numbness paresthesias or weakness or seizures.  Past Medical History  Diagnosis Date  . Overweight     Obesity  . Hypertension   . Hyperlipidemia   . Palpitations   . Chest pain     denied on 06/18/2012  . Hypoglycemia   . Uterine fibroid     pt. states that she has a "closed cervix"   . Sinusitis, acute     seen by PCP- 06/17/2012, will treat /w augmentin & tussin/DM  . H/O hiatal hernia   . Headache     using tylenol prn  . Aneurysm     brain  . Normal cardiac stress test     pt. released fr. [name] care in 10/2011, all findings w/i normal limits  . H. pylori infection     2012- stomach biopsy  . GERD (gastroesophageal reflux disease)     h/o colitis   . Family history of anesthesia complication     brother & neice "flat lined" during induction: neice d/t a medication given for a blood clotting problem; brother d/t "miscalculated amount of anesthesia"  . Chronic kidney disease     L - upper pole- defect, doesn't effect       Past Surgical History  Procedure Laterality Date  . Colonoscopy    . Lipoma excision      buttock 10/2010  . Craniotomy Right 06/24/2012    Procedure: CRANIOTOMY INTRACRANIAL ANEURYSM FOR CAROTID;  Surgeon: Carmela Hurt, MD;  Location: [facility];  Service: Neurosurgery;  Laterality: Right;  RIGHT Pterional Craniotomy for aneurysm    Family History  Problem Relation Age of Onset  . Hypertension Other   . Coronary artery disease Other    Social History:  reports that she quit  smoking about 8 years ago. Her smoking use included Cigarettes. She has a 12 pack-year smoking history. She quit smokeless tobacco use about 9 years ago. She reports that she does not drink alcohol or use illicit drugs.  Allergies:  Allergies  Allergen Reactions  . Zithromax (Azithromycin) Anaphylaxis  . Ace Inhibitors Swelling  . Ciprofloxacin Nausea And Vomiting    Also: kidney failure   . Other Swelling, Other (See Comments) and Cough    Sneezing allergy to cats  . Medrol (Methylprednisolone) Palpitations    Heart racing, increased BP  . Tetracyclines & Related Palpitations    Heart racing, increased bp    Medications Prior to Admission  Medication Sig Dispense Refill  . Cholecalciferol (D 1000) 1000 UNITS tablet Take 1,000 Units by mouth every evening.       . diltiazem (CARDIZEM CD) 180 MG 24 hr capsule Take 180 mg by mouth at bedtime.       [name] ibuprofen (ADVIL,MOTRIN) 200 MG tablet Take 200 mg by mouth daily as needed for pain.      [name] loratadine (CLARITIN) 10 MG tablet Take 10 mg by mouth daily before breakfast.       . lovastatin (MEVACOR) 20 MG tablet Take 20 mg by mouth at bedtime.      . metoprolol (LOPRESSOR)  50 MG tablet Take 50 mg by mouth 2 (two) times daily.       . ondansetron (ZOFRAN-ODT) 8 MG disintegrating tablet Take 8 mg by mouth every 8 (eight) hours as needed for nausea.      [name] oxyCODONE-acetaminophen (PERCOCET/ROXICET) 5-325 MG per tablet Take 1 tablet by mouth every 4 (four) hours as needed for pain.  20 tablet  0    Results for orders placed during the hospital encounter of 08/10/12 (from the past 48 hour(s))  URINALYSIS, ROUTINE W REFLEX MICROSCOPIC     Status: Abnormal   Collection Time    08/10/12  4:46 PM      Result Value Range   Color, Urine YELLOW  YELLOW   APPearance CLEAR  CLEAR   Specific Gravity, Urine 1.010  1.005 - 1.030   pH 7.5  5.0 - 8.0   Glucose, UA NEGATIVE  NEGATIVE mg/dL   Hgb urine dipstick NEGATIVE  NEGATIVE   Bilirubin Urine  NEGATIVE  NEGATIVE   Ketones, ur TRACE (*) NEGATIVE mg/dL   Protein, ur NEGATIVE  NEGATIVE mg/dL   Urobilinogen, UA 0.2  0.0 - 1.0 mg/dL   Nitrite NEGATIVE  NEGATIVE   Leukocytes, UA NEGATIVE  NEGATIVE   Comment: MICROSCOPIC NOT DONE ON URINES WITH NEGATIVE PROTEIN, BLOOD, LEUKOCYTES, NITRITE, OR GLUCOSE <1000 mg/dL.  CBC WITH DIFFERENTIAL     Status: Abnormal   Collection Time    08/10/12  5:01 PM      Result Value Range   WBC 11.3 (*) 4.0 - 10.5 K/uL   RBC 3.84 (*) 3.87 - 5.11 MIL/uL   Hemoglobin 10.8 (*) 12.0 - 15.0 g/dL   HCT 96.0 (*) 45.4 - 09.8 %   MCV 84.1  78.0 - 100.0 fL   MCH 28.1  26.0 - 34.0 pg   MCHC 33.4  30.0 - 36.0 g/dL   RDW 11.9  14.7 - 82.9 %   Platelets 376  150 - 400 K/uL   Neutrophils Relative 81 (*) 43 - 77 %   Neutro Abs 9.2 (*) 1.7 - 7.7 K/uL   Lymphocytes Relative 15  12 - 46 %   Lymphs Abs 1.7  0.7 - 4.0 K/uL   Monocytes Relative 4  3 - 12 %   Monocytes Absolute 0.4  0.1 - 1.0 K/uL   Eosinophils Relative 0  0 - 5 %   Eosinophils Absolute 0.0  0.0 - 0.7 K/uL   Basophils Relative 0  0 - 1 %   Basophils Absolute 0.0  0.0 - 0.1 K/uL  COMPREHENSIVE METABOLIC PANEL     Status: Abnormal   Collection Time    08/10/12  5:01 PM      Result Value Range   Sodium 136  135 - 145 mEq/L   Potassium 3.4 (*) 3.5 - 5.1 mEq/L   Chloride 98  96 - 112 mEq/L   CO2 27  19 - 32 mEq/L   Glucose, Bld 100 (*) 70 - 99 mg/dL   BUN 6  6 - 23 mg/dL   Creatinine, Ser 5.62  0.50 - 1.10 mg/dL   Calcium 9.6  8.4 - 13.0 mg/dL   Total Protein 7.5  6.0 - 8.3 g/dL   Albumin 3.8  3.5 - 5.2 g/dL   AST 17  0 - 37 U/L   ALT 7  0 - 35 U/L   Alkaline Phosphatase 63  39 - 117 U/L   Total Bilirubin 0.4  0.3 - 1.2 mg/dL  GFR calc non Af Amer 64 (*) >90 mL/min   GFR calc Af Amer 74 (*) >90 mL/min   Comment:            The eGFR has been calculated     using the CKD EPI equation.     This calculation has not been     validated in all clinical     situations.     eGFR's persistently      <90 mL/min signify     possible Chronic Kidney Disease.  LACTIC ACID, PLASMA     Status: None   Collection Time    08/10/12  5:01 PM      Result Value Range   Lactic Acid, Venous 1.0  0.5 - 2.2 mmol/L  CSF CELL COUNT WITH DIFFERENTIAL     Status: Abnormal   Collection Time    08/10/12 11:10 PM      Result Value Range   Tube # 3     Color, CSF COLORLESS  COLORLESS   Appearance, CSF HAZY (*) CLEAR   Supernatant NOT INDICATED     RBC Count, CSF 0  0 /cu mm   WBC, CSF 1549 (*) 0 - 5 /cu mm   Comment: CRITICAL RESULT CALLED TO, READ BACK BY AND VERIFIED WITH:     CAMP,J RN 08/11/2012 0031 JORDANS   Segmented Neutrophils-CSF 82 (*) 0 - 6 %   Lymphs, CSF 12 (*) 40 - 80 %   Monocyte-Macrophage-Spinal Fluid 6 (*) 15 - 45 %   Eosinophils, CSF 0  0 - 1 %  PROTEIN, CSF     Status: Abnormal   Collection Time    08/10/12 11:10 PM      Result Value Range   Total  Protein, CSF 55 (*) 15 - 45 mg/dL  GLUCOSE, CSF     Status: None   Collection Time    08/10/12 11:10 PM      Result Value Range   Glucose, CSF 51  43 - 76 mg/dL  CSF CULTURE     Status: None   Collection Time    08/10/12 11:10 PM      Result Value Range   Specimen Description CSF     Special Requests NO 2 4.5CC     Gram Stain       Value: WBC PRESENT,BOTH PMN AND MONONUCLEAR     NO ORGANISMS SEEN     CYTOSPIN SLIDE Performed at [facility]   Culture PENDING     Report Status PENDING    GRAM STAIN     Status: None   Collection Time    08/10/12 11:10 PM      Result Value Range   Specimen Description CSF     Special Requests NO 2 4.5CC     Gram Stain       Value: CYTOSPIN CSF     WBC PRESENT,BOTH PMN AND MONONUCLEAR     NO ORGANISMS SEEN   Report Status 08/11/2012 FINAL     Dg Lumbar Puncture Fluoro Guide  08/11/2012  *RADIOLOGY REPORT*  Clinical Data: Headache.  Fever.  Recent aneurysm repair.  LUMBAR PUNCTURE FLUORO GUIDE  Fluoroscopy time:  16 seconds.  Comparison: None.  Findings: After explaining the  purpose, procedure, risks, including bleeding, infection, medication reaction, and headache, and after obtaining written informed consent, using sterile technique and procedure I performed a lumbar puncture at the L3-4 level with a 20 gauge spinal needle using direct fluoroscopic guidance.  Clear colorless spinal fluid was obtained for laboratory examination. Opening pressure was 30 cm of water. The patient tolerated procedure well.  There were no immediate complications.  IMPRESSION: Lumbar puncture performed without complication.  Opening pressure was 30 cm of water.   Original Report Authenticated By: Francene Boyers, M.D.     A comprehensive review of systems was negative.  Blood pressure 131/79, pulse 104, temperature 98.8 F (37.1 C), temperature source Oral, resp. rate 18, height 5\' 3"  (1.6 m), weight 88.905 kg (196 lb), SpO2 99.00%.  She is awake and alert she is oriented and appropriate. Cranial nerve function intact. Motor and sensory function extremities normal. Her wound is clean and dry. Her chest is clear. Abdomen is benign. Extremities are free from injury or deformity. Examination her head ears eyes and 0 demonstrates no evidence of cerebrospinal leakage or other complication. Her wound is well-healed. Examination her neck finds moderate nuchal rigidity. Assessment/Plan Patient with evidence of significant meningitis of unknown cause. Likely we'll low-grade bacterial infection. Cultures pending from LP done in the emergency room. Patient started on vancomycin and Rocephin. I will add Decadron for symptomatic improvement. We will observe for evidence of CSF leak or other complication.  [name] [name] A 08/11/2012, 8:07 AM

## 2012-08-12 LAB — CBC
Platelets: 348 10*3/uL (ref 150–400)
RBC: 3.91 MIL/uL (ref 3.87–5.11)
RDW: 13 % (ref 11.5–15.5)
WBC: 13.6 10*3/uL — ABNORMAL HIGH (ref 4.0–10.5)

## 2012-08-12 LAB — BASIC METABOLIC PANEL
Calcium: 9.9 mg/dL (ref 8.4–10.5)
GFR calc non Af Amer: >90 mL/min (ref >90)
Sodium: 139 mEq/L (ref 135–145)

## 2012-08-12 NOTE — Progress Notes (Signed)
Feels better. Less headache. Less neck stiffness.  Afebrile. Vital stable. Urine output good. Awake and alert. Oriented and appropriate. Cranial nerve function intact. Motor and sensory function extremities normal. Wound clean and dry. No is no evidence of CSF leak or posturing.  Spinal fluid cultures still pending. Cytology consistent with bacterial process. Continue vancomycin and Rocephin. Continue Decadron for symptom release. Continue hospitalization.

## 2012-08-13 LAB — VANCOMYCIN, TROUGH: Vancomycin Tr: 8.9 ug/mL — ABNORMAL LOW (ref 10.0–20.0)

## 2012-08-13 LAB — GLUCOSE, CAPILLARY: Glucose-Capillary: 133 mg/dL — ABNORMAL HIGH (ref 70–99)

## 2012-08-13 NOTE — Progress Notes (Signed)
No headache. Feels good. Denies any neck stiffness. No photophobia.  Afebrile. Vitals are stable. Urine output good. Appears nontoxic. Awake and alert. Neurologically intact. Wound looks good. No evidence of CSF leak.  Cultures still no growth.  Postoperative meningitis. Cultures pending. Continue vancomycin and Rocephin. Continue Decadron for symptom relief. Mobilize as tolerated.

## 2012-08-14 LAB — CSF CULTURE W GRAM STAIN: Culture: NO GROWTH

## 2012-08-14 MED ORDER — VANCOMYCIN HCL 10 G IV SOLR
1250.0000 mg | Freq: Three times a day (TID) | INTRAVENOUS | Status: DC
  Administered 2012-08-14 – 2012-08-15 (×5): 1250 mg via INTRAVENOUS
  Filled 2012-08-14 (×7): qty 1250

## 2012-08-14 NOTE — Progress Notes (Signed)
ANTIBIOTIC CONSULT NOTE - Follow-Up   Pharmacy Consult for Vancomycin Indication: r/o meningitis  Allergies  Allergen Reactions  . Zithromax (Azithromycin) Anaphylaxis  . Ace Inhibitors Swelling  . Ciprofloxacin Nausea And Vomiting    Also: kidney failure   . Other Swelling, Other (See Comments) and Cough    Sneezing allergy to cats  . Medrol (Methylprednisolone) Palpitations    Heart racing, increased BP  . Tetracyclines & Related Palpitations    Heart racing, increased bp    Patient Measurements: Height: 5\' 3"  (160 cm) Weight: 196 lb (88.905 kg) IBW/kg (Calculated) : 52.4   Vital Signs: Temp: 97.5 F (36.4 C) (04/17 2238) Temp src: Oral (04/17 2238) BP: 114/53 mmHg (04/17 2238) Pulse Rate: 65 (04/17 2238)  Labs:  Recent Labs  08/12/12 0515  WBC 13.6*  HGB 11.1*  PLT 348  CREATININE 0.77   Estimated Creatinine Clearance: 88 ml/min (by C-G formula based on Cr of 0.77).   Microbiology: Recent Results (from the past 720 hour(s))  CSF CULTURE     Status: None   Collection Time    08/10/12 11:10 PM      Result Value Range Status   Specimen Description CSF   Final   Special Requests NO 2 4.5CC   Final   Gram Stain     Final   Value: WBC PRESENT,BOTH PMN AND MONONUCLEAR     NO ORGANISMS SEEN     CYTOSPIN SLIDE Performed at [facility]   Culture NO GROWTH 2 DAYS   Final   Report Status PENDING   Incomplete  GRAM STAIN     Status: None   Collection Time    08/10/12 11:10 PM      Result Value Range Status   Specimen Description CSF   Final   Special Requests NO 2 4.5CC   Final   Gram Stain     Final   Value: CYTOSPIN CSF     WBC PRESENT,BOTH PMN AND MONONUCLEAR     NO ORGANISMS SEEN   Report Status 08/11/2012 FINAL   Final  CULTURE, BLOOD (ROUTINE X 2)     Status: None   Collection Time    08/11/12  1:10 AM      Result Value Range Status   Specimen Description BLOOD LEFT ARM   Final   Special Requests BOTTLES DRAWN AEROBIC ONLY 6CC   Final   Culture  Setup Time 08/11/2012 07:21   Final   Culture     Final   Value:        BLOOD CULTURE RECEIVED NO GROWTH TO DATE CULTURE WILL BE HELD FOR 5 DAYS BEFORE ISSUING A FINAL NEGATIVE REPORT   Report Status PENDING   Incomplete  CULTURE, BLOOD (ROUTINE X 2)     Status: None   Collection Time    08/11/12  1:15 AM      Result Value Range Status   Specimen Description BLOOD LEFT HAND   Final   Special Requests BOTTLES DRAWN AEROBIC ONLY 5CC   Final   Culture  Setup Time 08/11/2012 07:21   Final   Culture     Final   Value:        BLOOD CULTURE RECEIVED NO GROWTH TO DATE CULTURE WILL BE HELD FOR 5 DAYS BEFORE ISSUING A FINAL NEGATIVE REPORT   Report Status PENDING   Incomplete    Medical History: Past Medical History  Diagnosis Date  . Overweight     Obesity  .  Hypertension   . Hyperlipidemia   . Palpitations   . Chest pain     denied on 06/18/2012  . Hypoglycemia   . Uterine fibroid     pt. states that she has a "closed cervix"   . Sinusitis, acute     seen by PCP- 06/17/2012, will treat /w augmentin & tussin/DM  . H/O hiatal hernia   . Headache     using tylenol prn  . Aneurysm     brain  . Normal cardiac stress test     pt. released fr. [name] care in 10/2011, all findings w/i normal limits  . H. pylori infection     2012- stomach biopsy  . GERD (gastroesophageal reflux disease)     h/o colitis   . Family history of anesthesia complication     brother & neice "flat lined" during induction: neice d/t a medication given for a blood clotting problem; brother d/t "miscalculated amount of anesthesia"  . 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 Patient is on D4 of vancomycin, ceftriaxone, and acyclovir for possible meningitis. Vancomycin trough (8.9 mcg/mL) is below-goal on 1250mg  IV Q12H.   Goal:  Vancomycin trough level 15-20  mcg/ml  Plan:  1. Change vancomycin dosing to 1.25gm IV Q8H.  Lorre Munroe, PharmD 08/14/2012,12:14 AM

## 2012-08-14 NOTE — Progress Notes (Signed)
Patient ID: [name] [name], female   DOB: 1960-08-20, 52 y.o.   MRN: [id number] Patient is doing much better on antibiotics no headache no nausea no vomiting. Her exam is neurologically intact. Her culture still negative we'll continue to observe await her cultures will probably be discharged a PICC line

## 2012-08-15 LAB — GLUCOSE, CAPILLARY: Glucose-Capillary: 136 mg/dL — ABNORMAL HIGH (ref 70–99)

## 2012-08-15 MED ORDER — ONDANSETRON HCL 4 MG/2ML IJ SOLN
INTRAMUSCULAR | Status: AC
  Administered 2012-08-15: 13:00:00
  Filled 2012-08-15: qty 2

## 2012-08-15 MED ORDER — ONDANSETRON HCL 4 MG/2ML IJ SOLN
4.0000 mg | Freq: Three times a day (TID) | INTRAMUSCULAR | Status: DC | PRN
  Administered 2012-08-20 (×3): 4 mg via INTRAVENOUS
  Filled 2012-08-15 (×3): qty 2

## 2012-08-15 NOTE — Progress Notes (Signed)
Subjective: Patient reports she is doing very well minimal to no headache no numbness no tingling no nausea no vomiting she feels very well ever since the vancomycin was initiated.  Objective: Vital signs in last 24 hours: Temp:  [97.9 F (36.6 C)-98 F (36.7 C)] 98 F (36.7 C) (04/19 0600) Pulse Rate:  [54-61] 61 (04/19 0600) Resp:  [18-20] 18 (04/19 0600) BP: (132-140)/(75-84) 140/75 mmHg (04/19 0600) SpO2:  [97 %-100 %] 100 % (04/19 0600)  Intake/Output from previous day: 04/18 0701 - 04/19 0700 In: 600 [P.O.:600] Out: -  Intake/Output this shift: Total I/O In: 10 [I.V.:10] Out: -   Strength out of 5 awake alert oriented  Lab Results: No results found for this basename: WBC, HGB, HCT, PLT,  in the last 72 hours BMET No results found for this basename: NA, K, CL, CO2, GLUCOSE, BUN, CREATININE, CALCIUM,  in the last 72 hours  Studies/Results: No results found.  Assessment/Plan: Still awaiting final culture results cultures are still pending no growth yet patient is doing very well on the antibiotics yard he has a PICC line placed I think the final cultures come back negative is to be stable enough for discharge I imagine that would be until Monday.  LOS: 5 days    [name] [name] P 08/15/2012, 9:08 AM

## 2012-08-15 NOTE — Progress Notes (Signed)
ANTIBIOTIC CONSULT NOTE - Follow-Up   Pharmacy Consult for Vancomycin Indication: r/o meningitis  Allergies  Allergen Reactions  . Zithromax (Azithromycin) Anaphylaxis  . Ace Inhibitors Swelling  . Ciprofloxacin Nausea And Vomiting    Also: kidney failure   . Other Swelling, Other (See Comments) and Cough    Sneezing allergy to cats  . Medrol (Methylprednisolone) Palpitations    Heart racing, increased BP  . Tetracyclines & Related Palpitations    Heart racing, increased bp   Labs: No results found for this basename: WBC, HGB, PLT, LABCREA, CREATININE,  in the last 72 hours Estimated Creatinine Clearance: 88 ml/min (by C-G formula based on Cr of 0.77).   Microbiology: Recent Results (from the past 720 hour(s))  CSF CULTURE     Status: None   Collection Time    08/10/12 11:10 PM      Result Value Range Status   Specimen Description CSF   Final   Special Requests NO 2 4.5CC   Final   Gram Stain     Final   Value: WBC PRESENT,BOTH PMN AND MONONUCLEAR     NO ORGANISMS SEEN     CYTOSPIN SLIDE Performed at [facility]   Culture NO GROWTH 3 DAYS   Final   Report Status 08/14/2012 FINAL   Final  GRAM STAIN     Status: None   Collection Time    08/10/12 11:10 PM      Result Value Range Status   Specimen Description CSF   Final   Special Requests NO 2 4.5CC   Final   Gram Stain     Final   Value: CYTOSPIN CSF     WBC PRESENT,BOTH PMN AND MONONUCLEAR     NO ORGANISMS SEEN   Report Status 08/11/2012 FINAL   Final  CULTURE, BLOOD (ROUTINE X 2)     Status: None   Collection Time    08/11/12  1:10 AM      Result Value Range Status   Specimen Description BLOOD LEFT ARM   Final   Special Requests BOTTLES DRAWN AEROBIC ONLY 6CC   Final   Culture  Setup Time 08/11/2012 07:21   Final   Culture     Final   Value:        BLOOD CULTURE RECEIVED NO GROWTH TO DATE CULTURE WILL BE HELD FOR 5 DAYS BEFORE ISSUING A FINAL NEGATIVE REPORT   Report Status PENDING   Incomplete   CULTURE, BLOOD (ROUTINE X 2)     Status: None   Collection Time    08/11/12  1:15 AM      Result Value Range Status   Specimen Description BLOOD LEFT HAND   Final   Special Requests BOTTLES DRAWN AEROBIC ONLY 5CC   Final   Culture  Setup Time 08/11/2012 07:21   Final   Culture     Final   Value:        BLOOD CULTURE RECEIVED NO GROWTH TO DATE CULTURE WILL BE HELD FOR 5 DAYS BEFORE ISSUING A FINAL NEGATIVE REPORT   Report Status PENDING   Incomplete    Medical History: Past Medical History  Diagnosis Date  . Overweight     Obesity  . Hypertension   . Hyperlipidemia   . Palpitations   . Chest pain     denied on 06/18/2012  . Hypoglycemia   . Uterine fibroid     pt. states that she has a "closed cervix"   . Sinusitis,  acute     seen by PCP- 06/17/2012, will treat /w augmentin & tussin/DM  . H/O hiatal hernia   . Headache     using tylenol prn  . Aneurysm     brain  . Normal cardiac stress test     pt. released fr. [name] care in 10/2011, all findings w/i normal limits  . H. pylori infection     2012- stomach biopsy  . GERD (gastroesophageal reflux disease)     h/o colitis   . Family history of anesthesia complication     brother & neice "flat lined" during induction: neice d/t a medication given for a blood clotting problem; brother d/t "miscalculated amount of anesthesia"  . 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 Patient is on D4 of vancomycin, ceftriaxone, and acyclovir for possible meningitis.  Vancomycin trough now supra-therapeutic  Goal:  Vancomycin trough level 15-20 mcg/ml  Plan:  1) Hold vancomycin 2) Follow up random level in AM since renal function stable  Thank you. Okey Regal, PharmD [phone]  08/15/2012,3:26 PM

## 2012-08-16 LAB — BASIC METABOLIC PANEL
BUN: 32 mg/dL — ABNORMAL HIGH (ref 6–23)
Calcium: 9.2 mg/dL (ref 8.4–10.5)
GFR calc non Af Amer: 29 mL/min — ABNORMAL LOW (ref >90)
Glucose, Bld: 121 mg/dL — ABNORMAL HIGH (ref 70–99)

## 2012-08-16 LAB — GLUCOSE, CAPILLARY: Glucose-Capillary: 126 mg/dL — ABNORMAL HIGH (ref 70–99)

## 2012-08-16 LAB — VANCOMYCIN, RANDOM: Vancomycin Rm: 33.4 ug/mL

## 2012-08-16 MED ORDER — ACYCLOVIR SODIUM 50 MG/ML IV SOLN
700.0000 mg | Freq: Two times a day (BID) | INTRAVENOUS | Status: DC
  Administered 2012-08-16 – 2012-08-20 (×7): 700 mg via INTRAVENOUS
  Filled 2012-08-16 (×10): qty 14

## 2012-08-16 NOTE — Progress Notes (Signed)
Subjective: Patient reports feeling better  Objective: Vital signs in last 24 hours: Temp:  [97.5 F (36.4 C)-98 F (36.7 C)] 97.8 F (36.6 C) (04/20 0611) Pulse Rate:  [54-56] 56 (04/20 0611) Resp:  [18-20] 20 (04/20 0611) BP: (131-141)/(68-73) 133/68 mmHg (04/20 0611) SpO2:  [97 %-99 %] 99 % (04/20 0611)  Intake/Output from previous day: 04/19 0701 - 04/20 0700 In: 20 [I.V.:20] Out: -  Intake/Output this shift:    Physical Exam: No meningismus or photophobia.  No complaint of Headache.  Lab Results: No results found for this basename: WBC, HGB, HCT, PLT,  in the last 72 hours BMET No results found for this basename: NA, K, CL, CO2, GLUCOSE, BUN, CREATININE, CALCIUM,  in the last 72 hours  Studies/Results: No results found.  Assessment/Plan: Will continue IV ABX for 10 days to 2 weeks.  Cultures negative.  Can probably complete ABX at home with PICC line.  Dr. Jordan Likes to arrange in AM (not possible on Easter Sunday).    LOS: 6 days    Dorian Heckle, MD 08/16/2012, 7:49 AM

## 2012-08-17 LAB — CULTURE, BLOOD (ROUTINE X 2): Culture: NO GROWTH

## 2012-08-17 LAB — VANCOMYCIN, RANDOM: Vancomycin Rm: 17.2 ug/mL

## 2012-08-17 LAB — BASIC METABOLIC PANEL
BUN: 30 mg/dL — ABNORMAL HIGH (ref 6–23)
CO2: 29 mEq/L (ref 19–32)
Calcium: 9.2 mg/dL (ref 8.4–10.5)
Chloride: 99 mEq/L (ref 96–112)
Creatinine, Ser: 1.6 mg/dL — ABNORMAL HIGH (ref 0.50–1.10)
GFR calc Af Amer: 42 mL/min — ABNORMAL LOW (ref >90)
GFR calc non Af Amer: 36 mL/min — ABNORMAL LOW (ref >90)
Glucose, Bld: 130 mg/dL — ABNORMAL HIGH (ref 70–99)
Potassium: 4 mEq/L (ref 3.5–5.1)
Sodium: 137 mEq/L (ref 135–145)

## 2012-08-17 MED ORDER — HEPARIN SOD (PORK) LOCK FLUSH 100 UNIT/ML IV SOLN
250.0000 [IU] | INTRAVENOUS | Status: AC | PRN
  Administered 2012-08-17: 250 [IU]

## 2012-08-17 MED ORDER — DEXTROSE 5 % IV SOLN: 2.0000 g | Freq: Two times a day (BID) | INTRAVENOUS | Status: DC

## 2012-08-17 MED ORDER — VANCOMYCIN HCL 1000 MG IV SOLR
750.0000 mg | Freq: Two times a day (BID) | INTRAVENOUS | Status: DC
  Administered 2012-08-17 – 2012-08-19 (×5): 750 mg via INTRAVENOUS
  Filled 2012-08-17 (×7): qty 750

## 2012-08-17 MED ORDER — VANCOMYCIN HCL 1000 MG IV SOLR: 750.0000 mg | Freq: Two times a day (BID) | INTRAVENOUS | Status: DC

## 2012-08-17 MED ORDER — LABETALOL HCL 5 MG/ML IV SOLN
10.0000 mg | INTRAVENOUS | Status: DC | PRN
  Administered 2012-08-17 – 2012-08-21 (×4): 10 mg via INTRAVENOUS
  Filled 2012-08-17 (×4): qty 4

## 2012-08-17 NOTE — Progress Notes (Signed)
ANTIBIOTIC CONSULT NOTE-PROGRESS NOTE  Pharmacy Consult for Vancomycin Indication: R/O Meningitis  Hospital Problems Principal Problem:   Meningitis, unspecified   Height: 63 in Weight: 89 kg  Vitals: BP 160/86  Pulse 73  Temp(Src) 98.2 F (36.8 C) (Oral)  Resp 18  Ht 5\' 3"  (1.6 m)  Wt 196 lb (88.905 kg)  BMI 34.73 kg/m2  SpO2 100%  Labs:  Recent Labs  08/16/12 1000 08/17/12 0640  CREATININE 1.92* 1.60*   Estimated Creatinine Clearance: 44 ml/min (by C-G formula based on Cr of 1.6).  Random Vancomycin level 17.2 mcg/ml [06:40 am]   Microbiology: Recent Results (from the past 720 hour(s))  CSF CULTURE     Status: None   Collection Time    08/10/12 11:10 PM      Result Value Range Status   Specimen Description CSF   Final   Special Requests NO 2 4.5CC   Final   Gram Stain     Final   Value: WBC PRESENT,BOTH PMN AND MONONUCLEAR     NO ORGANISMS SEEN     CYTOSPIN SLIDE Performed at [facility]   Culture NO GROWTH 3 DAYS   Final   Report Status 08/14/2012 FINAL   Final  GRAM STAIN     Status: None   Collection Time    08/10/12 11:10 PM      Result Value Range Status   Specimen Description CSF   Final   Special Requests NO 2 4.5CC   Final   Gram Stain     Final   Value: CYTOSPIN CSF     WBC PRESENT,BOTH PMN AND MONONUCLEAR     NO ORGANISMS SEEN   Report Status 08/11/2012 FINAL   Final  CULTURE, BLOOD (ROUTINE X 2)     Status: None   Collection Time    08/11/12  1:10 AM      Result Value Range Status   Specimen Description BLOOD LEFT ARM   Final   Special Requests BOTTLES DRAWN AEROBIC ONLY [facility]   Final   Culture  Setup Time 08/11/2012 07:21   Final   Culture NO GROWTH 5 DAYS   Final   Report Status 08/17/2012 FINAL   Final  CULTURE, BLOOD (ROUTINE X 2)     Status: None   Collection Time    08/11/12  1:15 AM      Result Value Range Status   Specimen Description BLOOD LEFT HAND   Final   Special Requests BOTTLES DRAWN AEROBIC ONLY 5CC   Final    Culture  Setup Time 08/11/2012 07:21   Final   Culture NO GROWTH 5 DAYS   Final   Report Status 08/17/2012 FINAL   Final    Anti-infectives Anti-infectives   Start     Dose/Rate Route Frequency Ordered Stop   08/16/12 1625  acyclovir (ZOVIRAX) 700 mg in dextrose 5 % 100 mL IVPB     700 mg 114 mL/hr over 60 Minutes Intravenous Every 12 hours 08/16/12 1158     08/14/12 0600  vancomycin (VANCOCIN) 1,250 mg in sodium chloride 0.9 % 250 mL IVPB  Status:  Discontinued     1,250 mg 166.7 mL/hr over 90 Minutes Intravenous Every 8 hours 08/14/12 0029 08/15/12 1524   08/11/12 1800  cefTRIAXone (ROCEPHIN) 2 g in dextrose 5 % 50 mL IVPB     2 g 100 mL/hr over 30 Minutes Intravenous Every 12 hours 08/11/12 0815     08/11/12 1200  acyclovir (ZOVIRAX)  700 mg in dextrose 5 % 100 mL IVPB  Status:  Discontinued     700 mg 114 mL/hr over 60 Minutes Intravenous Every 8 hours 08/11/12 0104 08/16/12 1158   08/11/12 1100  vancomycin (VANCOCIN) 1,250 mg in sodium chloride 0.9 % 250 mL IVPB  Status:  Discontinued     1,250 mg 166.7 mL/hr over 90 Minutes Intravenous Every 12 hours 08/11/12 0959 08/14/12 0029   08/11/12 0115  acyclovir (ZOVIRAX) 700 mg in dextrose 5 % 100 mL IVPB     700 mg 114 mL/hr over 60 Minutes Intravenous NOW 08/11/12 0104 08/11/12 0508   08/11/12 0100  cefTRIAXone (ROCEPHIN) 2 g in dextrose 5 % 50 mL IVPB     2 g 100 mL/hr over 30 Minutes Intravenous  Once 08/11/12 0049 08/11/12 0405   08/11/12 0045  vancomycin (VANCOCIN) IVPB 1000 mg/200 mL premix     1,000 mg 200 mL/hr over 60 Minutes Intravenous  Once 08/11/12 0037 08/11/12 0221      Assessment:  Day # 7 Vancomycin, Ceftriaxone, and Acyclovir for presumed Meningitis.  Cultures negative.  SCr 1.6, Est CrCl 44 ml/min  Random Vancomycin level 17.2 mcg/ml after holding Vancomycin yesterday.  Goal of Therapy:   Vancomycin trough level 15-20 mcg/ml  Plan:   Resume Vancomycin at lower dose, 750 mg IV q 12 hours. Continue  Ceftriaxone and Acyclovir at current doses.  Plan to check Vancomycin trough @ steady state.  Length of therapy anticipated total of 10 - 14 days.  Laurena Bering, Pharm.D.  08/17/2012 2:18 PM

## 2012-08-17 NOTE — Discharge Summary (Signed)
Physician Discharge Summary  Patient ID: [name] [name] MRN: [id number] DOB/AGE: 52/27/1962 52 y.o.  Admit date: 08/10/2012 Discharge date: 08/17/2012  Admission Diagnoses:  Discharge Diagnoses:  Principal Problem:   Meningitis, unspecified   Discharged Condition: good  Hospital Course: Admitted hospital for workup and treatment of her presumed postoperative bacterial meningitis. Cerebral spinal fluid cultures no growth area patient much improved with IV antibiotic therapy. Plan for her 2 week course of IV antibiotics with the remaining 1 week performed at home with home health. Currently patient doing well. No headache. No neurologic symptoms.  Consults:   Significant Diagnostic Studies:   Treatments:   Discharge Exam: Blood pressure 160/86, pulse 73, temperature 98.2 F (36.8 C), temperature source Oral, resp. rate 18, height 5\' 3"  (1.6 m), weight 88.905 kg (196 lb), SpO2 100.00%. Awake and alert. Oriented and appropriate cranial nerve function intact. Motor and sensory function extremities normal. Wound well-healed. No meningismus. No evidence of CSF leak. Chest and abdomen benign.  Nerve root  Disposition: 01-Home or Self Care  Discharge Orders   Future Orders Complete By Expires     Face-to-face encounter (required for Medicare/Medicaid patients)  As directed     Comments:      I Sarabelle Genson A certify that this patient is under my care and that I, or a nurse practitioner or physician's assistant working with me, had a face-to-face encounter that meets the physician face-to-face encounter requirements with this patient on 08/17/2012. The encounter with the patient was in whole, or in part for the following medical condition(s) which is the primary reason for home health care (List medical condition): Meningitis with IV antibiotic therapy. Plan IV vancomycin 750 mg every 12 hours for 7 days and Rocephin 2 g IV every 12 hours for 7 days.    Questions:      The encounter with  the patient was in whole, or in part, for the following medical condition, which is the primary reason for home health care:  Meningitis requiring home IV antibiotic therapy    I certify that, based on my findings, the following services are medically necessary home health services:  Nursing    My clinical findings support the need for the above services:  Unable to leave home safely without assistance and/or assistive device    Further, I certify that my clinical findings support that this patient is homebound due to:  Unable to leave home safely without assistance    Reason for Medically Necessary Home Health Services:  Skilled Nursing- Assessment and Training for Infusion Therapy, Line Care, and Infection Control    Home Health  As directed     Questions:      To provide the following care/treatments:  RN        Medication List    TAKE these medications       D 1000 1000 UNITS tablet  Generic drug:  Cholecalciferol  Take 1,000 Units by mouth every evening.     dextrose 5 % SOLN 50 mL with cefTRIAXone 2 G SOLR 2 g  Inject 2 g into the vein every 12 (twelve) hours.     diltiazem 180 MG 24 hr capsule  Commonly known as:  CARDIZEM CD  Take 180 mg by mouth at bedtime.     ibuprofen 200 MG tablet  Commonly known as:  ADVIL,MOTRIN  Take 200 mg by mouth daily as needed for pain.     loratadine 10 MG tablet  Commonly known as:  CLARITIN  Take  10 mg by mouth daily before breakfast.     lovastatin 20 MG tablet  Commonly known as:  MEVACOR  Take 20 mg by mouth at bedtime.     metoprolol 50 MG tablet  Commonly known as:  LOPRESSOR  Take 50 mg by mouth 2 (two) times daily.     ondansetron 8 MG disintegrating tablet  Commonly known as:  ZOFRAN-ODT  Take 8 mg by mouth every 8 (eight) hours as needed for nausea.     oxyCODONE-acetaminophen 5-325 MG per tablet  Commonly known as:  PERCOCET/ROXICET  Take 1 tablet by mouth every 4 (four) hours as needed for pain.     sodium chloride  0.9 % SOLN 150 mL with vancomycin 1000 MG SOLR 750 mg  Inject 750 mg into the vein every 12 (twelve) hours.           Follow-up Information   Follow up with CABBELL,KYLE L, MD. Call in 1 week.   Contact information:   [address], [address]                         [address] [place] [address] [phone]       Signed: Temple Pacini 08/17/2012, 3:05 PM

## 2012-08-17 NOTE — Progress Notes (Signed)
Patient not discharged.  Went into room to discharge patient around 1730. Patient blood pressure elevated.  Called Dr. Venetia Maxon who gave orders for labetelol and to delay discharge until Dr. Jordan Likes could see patient in the morning.  Labetelol given and pressure down from 160/110 to 150/90. [facility] her [facility] this evening.  I called and spoke with that RN and explained that patient would be staying in the hospital tonight.   Will pass information to oncoming shift.  Lance Bosch, RN

## 2012-08-17 NOTE — Progress Notes (Signed)
Home health set up with [facility].  Patient is going home with her PICC line to receive the rest of her antibiotic therapy.  She is awaiting IV team to unhook her and then she will go. Discharge paperwork and education completed.   Lance Bosch, RN

## 2012-08-17 NOTE — Progress Notes (Signed)
Paged Dr. Lovell Sheehan about patients blood presssure of 160/100.  Patient is supposed to be leaving and is very concerned and does not want to leave until doctor is aware.  I am awaiting a call from him.  Lance Bosch, RN

## 2012-08-17 NOTE — Care Management Note (Addendum)
    Page 1 of 2   08/25/2012     1:41:30 PM   CARE MANAGEMENT NOTE 08/25/2012  Patient:  [name] [name]   Account Number:  [id number]  Date Initiated:  08/11/2012  Documentation initiated by:  Jacquelynn Cree  Subjective/Objective Assessment:   admitted with bacterial meningitis     Action/Plan:   home with IV antibiotics   Anticipated DC Date:  08/25/2012   Anticipated DC Plan:  HOME/SELF CARE      DC Planning Services  CM consult      Choice offered to / List presented to:  C-1 Patient           Status of service:  Completed, signed off Medicare Important Message given?   (If response is "NO", the following Medicare IM given date fields will be blank) Date Medicare IM given:   Date Additional Medicare IM given:    Discharge Disposition:  HOME/SELF CARE  Per UR Regulation:  Reviewed for med. necessity/level of care/duration of stay  If discussed at Long Length of Stay Meetings, dates discussed:   08/18/2012  08/25/2012    Comments:  08/25/12 PICC removed today,d/c'd to home without HHC, had completed IV antibiotics prior to d/c. Jacquelynn Cree RN, BSN, CCM   08/24/12 10:28  [name] RN, BSN [phone] patient is for dc today home with iv abx , already set up with [facility],  notified [facility] that patient will be dc today, per CSW report form [facility] today.  Received a call from [facility]  RN stating patient will not need home iv abx.  08/19/12 Discharge cancelled on 08/17/12 due to hypertension, BUN and creatinine elevated nephrology consulted, tests pending. D/c plan is still home with IV antibiotics. Will continue to follow.   08/17/12 Spoke with patient about HHC for home IV antibiotics. She selected [facility] from [employer] of agencies. Patient is willing to learn to give antibiotics and has family members who are RNs. Contacted [facility] at [facility] and [facility] and Iv antibiotics, orders pending. If orders received and patient discharging today, they will  plan for 6pm visit. Jacquelynn Cree RN, BSN, CCM

## 2012-08-17 NOTE — Progress Notes (Signed)
I was discharging the patient when she mentioned that her bp had been high earlier.  She had asked the tech Morton Amy) to please take it manually.  According to the patient, [place] said that she was "going to find the Rn and let her know."  This RN was never alerted.  I rechecked her pressure at around 1730 and it was elevated.  I called Dr. Venetia Maxon who gave orders for labetelol.  Lance Bosch

## 2012-08-18 LAB — BASIC METABOLIC PANEL
BUN: 37 mg/dL — ABNORMAL HIGH (ref 6–23)
Chloride: 100 mEq/L (ref 96–112)
Creatinine, Ser: 1.44 mg/dL — ABNORMAL HIGH (ref 0.50–1.10)
GFR calc Af Amer: 48 mL/min — ABNORMAL LOW (ref >90)
Glucose, Bld: 162 mg/dL — ABNORMAL HIGH (ref 70–99)

## 2012-08-18 LAB — CBC
HCT: 31.3 % — ABNORMAL LOW (ref 36.0–46.0)
Hemoglobin: 10.9 g/dL — ABNORMAL LOW (ref 12.0–15.0)
MCHC: 34.8 g/dL (ref 30.0–36.0)
MCV: 80.9 fL (ref 78.0–100.0)
RDW: 13.6 % (ref 11.5–15.5)
WBC: 19.7 10*3/uL — ABNORMAL HIGH (ref 4.0–10.5)

## 2012-08-18 NOTE — Progress Notes (Addendum)
Informed by NT that patient's BP was 163/83 HR 77. Gave labetolol prn. Will re-assess BP    UPDATE: BP at 1030 was 152/89 HR 69.  [name] [name], [name] [name]

## 2012-08-18 NOTE — Progress Notes (Signed)
Discharge canceled last night secondary to worsening hypertension. Patient has some element of acute renal failure likely secondary to her vancomycin. This appears to be resolving. Her blood pressure now is better controlled but still higher than when she came in. Neurologically her exam is intact. She is having no other difficulty.  Continue vancomycin and Rocephin for treatment of a presumed bacterial meningitis. Check followup electrolytes to assess renal function. If creatinine continues to return to baseline then I think discharge home this evening could be considered. Dr. Franky Macho is back and will reassume her care.

## 2012-08-18 NOTE — Progress Notes (Signed)
ANTIBIOTIC CONSULT NOTE - FOLLOW UP  Pharmacy Consult for Vancomycin Indication: presumed meningitis  Patient Measurements: Height: 5\' 3"  (160 cm) Weight: 196 lb (88.905 kg) IBW/kg (Calculated) : 52.4  Vital Signs: Temp: 98.3 F (36.8 C) (04/22 2120) Temp src: Oral (04/22 1800) BP: 140/82 mmHg (04/22 2120) Pulse Rate: 64 (04/22 2120)   Recent Labs  08/16/12 1000 08/17/12 0640 08/18/12 1200  WBC  --   --  19.7*  HGB  --   --  10.9*  PLT  --   --  336  CREATININE 1.92* 1.60* 1.44*   Estimated Creatinine Clearance: 48.9 ml/min (by C-G formula based on Cr of 1.44).  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 Continuing on Vancomycin (day #8) for possible meningitis with plan for 2 week course. Vancomycin dose changed to 750mg  IV Q12 hours on 4/21 after supra-therapeutic trough at higher dosing. Scr is continuing to improve--down to 1.44 today, from 1.60 yesterday, and 1.92 on Sunday. Vancomycin trough tonight is slightly above goal at 20.2, but with renal function improving will likely decrease. Tmax 99.1'F, WBC 19.7.    Goal of Therapy:  Vancomycin trough level 15-20 mcg/ml  Plan:  1) Continue Vancomycin 750mg  IV Q12 hours for now  2) Monitor renal function closely 3) Consider vancomycin trough in next couple days as renal function improves and to confirm at goal/not accumulating   Benjaman Pott, PharmD, BCPS 08/18/2012   10:42 PM

## 2012-08-19 ENCOUNTER — Inpatient Hospital Stay (HOSPITAL_COMMUNITY): Payer: BC Managed Care – PPO

## 2012-08-19 LAB — DIFFERENTIAL
Eosinophils Absolute: 0 10*3/uL (ref 0.0–0.7)
Eosinophils Relative: 0 % (ref 0–5)
Lymphs Abs: 1.2 10*3/uL (ref 0.7–4.0)
Monocytes Absolute: 1.1 10*3/uL — ABNORMAL HIGH (ref 0.1–1.0)

## 2012-08-19 LAB — URINALYSIS, ROUTINE W REFLEX MICROSCOPIC
Bilirubin Urine: NEGATIVE
Nitrite: NEGATIVE
Specific Gravity, Urine: 1.017 (ref 1.005–1.030)
Urobilinogen, UA: 0.2 mg/dL (ref 0.0–1.0)
pH: 7 (ref 5.0–8.0)

## 2012-08-19 LAB — BASIC METABOLIC PANEL
BUN: 31 mg/dL — ABNORMAL HIGH (ref 6–23)
Chloride: 101 mEq/L (ref 96–112)
Creatinine, Ser: 1.43 mg/dL — ABNORMAL HIGH (ref 0.50–1.10)
GFR calc Af Amer: 48 mL/min — ABNORMAL LOW (ref >90)
Glucose, Bld: 155 mg/dL — ABNORMAL HIGH (ref 70–99)
Potassium: 3.8 mEq/L (ref 3.5–5.1)

## 2012-08-19 LAB — URIC ACID: Uric Acid, Serum: 5.8 mg/dL (ref 2.4–7.0)

## 2012-08-19 LAB — URINE MICROSCOPIC-ADD ON

## 2012-08-19 MED ORDER — DEXAMETHASONE SODIUM PHOSPHATE 4 MG/ML IJ SOLN
4.0000 mg | Freq: Three times a day (TID) | INTRAMUSCULAR | Status: DC
  Administered 2012-08-20 (×3): 4 mg via INTRAVENOUS
  Filled 2012-08-19 (×5): qty 1

## 2012-08-19 NOTE — Consult Note (Signed)
Reason for Consult:AKI Referring Physician: Dr. Cleone Slim Hart-Lowe is an 52 y.o. female.  HPI: 52 yr female with hx HTN since about 2008 when was worked up @ [facility] and apparently had one congentally small kidney (sx of tachycardia, HA, SOB), managed on Dilt and Metoprolol.  Hx resection of carotid aneurysm abotu7 wk ago.  Presented 4/12 with HA and after several encounters with medical system was diagnosed with menigitis on 4/14.  In hosp until now. 2 nights ago had her HTN sx and found to have ^ BP and Cr of 1.98 (1 On admit) and is 1.43 today.  Tx for menigitis with Ceftriaxone, Acyclovir, and Vanco.  Hx AKI apparently in 8/13 from Cipro/Flagyl for divetic flare.  Known low GFR.    No rash arthragias, sores in mouth, dry eyes, joint pain. But has been nauseated.  No D.  No FH of renal dz, or inherited eye, ms skel,or hearing defects.  No UTIs,stones or hematuria. But has nocturia x4.  One brother with Type 1 DM and one with Type 2.  Several sibs and parent with HTN.  Father died of heart dx in 10's Constitutional: as above, feels thirsty Eyes: wears glasses Ears, nose, mouth, throat, and face: negative Respiratory: hay fever Cardiovascular: as above Gastrointestinal: negative except for nausea Genitourinary:negative Integument/breast: dry skin Hematologic/lymphatic: negative Musculoskeletal:negative Neurological: se NS notes Endocrine: negative Allergic/Immunologic: azith, Cipro, ? ACEI   Past Medical History  Diagnosis Date  . Overweight     Obesity  . Hypertension   . Hyperlipidemia   . Palpitations   . Chest pain     denied on 06/18/2012  . Hypoglycemia   . Uterine fibroid     pt. states that she has a "closed cervix"   . Sinusitis, acute     seen by PCP- 06/17/2012, will treat /w augmentin & tussin/DM  . H/O hiatal hernia   . Headache     using tylenol prn  . Aneurysm     brain  . Normal cardiac stress test     pt. released fr. [name] care in 10/2011, all  findings w/i normal limits  . H. pylori infection     2012- stomach biopsy  . GERD (gastroesophageal reflux disease)     h/o colitis   . Family history of anesthesia complication     brother & neice "flat lined" during induction: neice d/t a medication given for a blood clotting problem; brother d/t "miscalculated amount of anesthesia"  . Chronic kidney disease     L - upper pole- defect, doesn't effect       Past Surgical History  Procedure Laterality Date  . Colonoscopy    . Lipoma excision      buttock 10/2010  . Craniotomy Right 06/24/2012    Procedure: CRANIOTOMY INTRACRANIAL ANEURYSM FOR CAROTID;  Surgeon: Carmela Hurt, MD;  Location: [facility];  Service: Neurosurgery;  Laterality: Right;  RIGHT Pterional Craniotomy for aneurysm    Family History  Problem Relation Age of Onset  . Hypertension Other   . Coronary artery disease Other     Social History:  reports that she quit smoking about 8 years ago. Her smoking use included Cigarettes. She has a 12 pack-year smoking history. She quit smokeless tobacco use about 9 years ago. She reports that she does not drink alcohol or use illicit drugs.  Allergies:  Allergies  Allergen Reactions  . Zithromax (Azithromycin) Anaphylaxis  . Ace Inhibitors Swelling  . Ciprofloxacin  Nausea And Vomiting    Also: kidney failure   . Other Swelling, Other (See Comments) and Cough    Sneezing allergy to cats  . Medrol (Methylprednisolone) Palpitations    Heart racing, increased BP  . Tetracyclines & Related Palpitations    Heart racing, increased bp    Medications: I have reviewed the patient's current medications.  Results for orders placed during the hospital encounter of 08/10/12 (from the past 48 hour(s))  GLUCOSE, CAPILLARY     Status: Abnormal   Collection Time    08/18/12  6:27 AM      Result Value Range   Glucose-Capillary 134 (*) 70 - 99 mg/dL  CBC     Status: Abnormal   Collection Time    08/18/12 12:00 PM       Result Value Range   WBC 19.7 (*) 4.0 - 10.5 K/uL   RBC 3.87  3.87 - 5.11 MIL/uL   Hemoglobin 10.9 (*) 12.0 - 15.0 g/dL   HCT 16.1 (*) 09.6 - 04.5 %   MCV 80.9  78.0 - 100.0 fL   MCH 28.2  26.0 - 34.0 pg   MCHC 34.8  30.0 - 36.0 g/dL   RDW 40.9  81.1 - 91.4 %   Platelets 336  150 - 400 K/uL  BASIC METABOLIC PANEL     Status: Abnormal   Collection Time    08/18/12 12:00 PM      Result Value Range   Sodium 138  135 - 145 mEq/L   Potassium 3.8  3.5 - 5.1 mEq/L   Chloride 100  96 - 112 mEq/L   CO2 27  19 - 32 mEq/L   Glucose, Bld 162 (*) 70 - 99 mg/dL   BUN 37 (*) 6 - 23 mg/dL   Creatinine, Ser 7.82 (*) 0.50 - 1.10 mg/dL   Calcium 8.9  8.4 - 95.6 mg/dL   GFR calc non Af Amer 41 (*) >90 mL/min   GFR calc Af Amer 48 (*) >90 mL/min   Comment:            The eGFR has been calculated     using the CKD EPI equation.     This calculation has not been     validated in all clinical     situations.     eGFR's persistently     <90 mL/min signify     possible Chronic Kidney Disease.  VANCOMYCIN, TROUGH     Status: Abnormal   Collection Time    08/18/12  9:30 PM      Result Value Range   Vancomycin Tr 20.2 (*) 10.0 - 20.0 ug/mL  BASIC METABOLIC PANEL     Status: Abnormal   Collection Time    08/19/12  5:00 AM      Result Value Range   Sodium 138  135 - 145 mEq/L   Potassium 3.8  3.5 - 5.1 mEq/L   Chloride 101  96 - 112 mEq/L   CO2 28  19 - 32 mEq/L   Glucose, Bld 155 (*) 70 - 99 mg/dL   BUN 31 (*) 6 - 23 mg/dL   Creatinine, Ser 2.13 (*) 0.50 - 1.10 mg/dL   Calcium 9.0  8.4 - 08.6 mg/dL   GFR calc non Af Amer 42 (*) >90 mL/min   GFR calc Af Amer 48 (*) >90 mL/min   Comment:            The eGFR has been calculated  using the CKD EPI equation.     This calculation has not been     validated in all clinical     situations.     eGFR's persistently     <90 mL/min signify     possible Chronic Kidney Disease.  GLUCOSE, CAPILLARY     Status: Abnormal   Collection Time     08/19/12  6:29 AM      Result Value Range   Glucose-Capillary 149 (*) 70 - 99 mg/dL    No results found.  @ROS @ Blood pressure 154/74, pulse 60, temperature 98.6 F (37 C), temperature source Oral, resp. rate 16, height 5\' 3"  (1.6 m), weight 88.905 kg (196 lb), SpO2 97.00%. @PHYSEXAMBYAGE2 @ Physical Examination: General appearance - alert, well appearing, and in no distress Mental status - alert, oriented to person, place, and time Eyes - pupils equal and reactive, extraocular eye movements intact, funduscopic exam normal, discs flat and sharp Mouth - mucous membranes moist, pharynx normal without lesions  Neck - adenopathy noted PCL Lymphatics - posterior cervical nodes Chest - clear to auscultation, no wheezes, rales or rhonchi, symmetric air entry Heart - normal rate, regular rhythm, normal S1, S2, no murmurs, rubs, clicks or gallops Abdomen - obese,pos bs,soft Extremities - peripheral pulses normal, no pedal edema, no clubbing or cyanosis, dry skin Skin - normal coloration and turgor, no rashes, no suspicious skin lesions noted dry  Assessment/Plan: 1 AKI most c/w toxic injury. Dye with CTs, Acylovir, and Vanco most likely culprits.  Need to discern acute allergic vs just toxic.  R/o obstruct or post infectious phenomenon.  No prob with acid/base/k.vol 2 CKD?? Need to get a basis and recordss 3 Menigitis ?? Cause if infectious 4 Obesity 5 HTN controlled 6 ^ lipids P U/S, diff, C3 & 4, UA , depending on results may need to change/D/C meds    Wilder Amodei L 08/19/2012, 1:37 PM

## 2012-08-19 NOTE — Progress Notes (Signed)
Patient ID: [name] [name], female   DOB: [birth year], 52 y.o.   MRN: [id number] BP 147/79  Pulse 56  Temp(Src) 98.3 F (36.8 C) (Oral)  Resp 16  Ht 5\' 3"  (1.6 m)  Wt 88.905 kg (196 lb)  BMI 34.73 kg/m2  SpO2 99% Alert and oriented x 4 Speech is clear and fluent perrl Moving all extremities well Wound is clean dry and without signs of infection Appreciate nephrology consult Will discontinue vancomycin

## 2012-08-20 LAB — COMPREHENSIVE METABOLIC PANEL
ALT: 19 U/L (ref 0–35)
BUN: 33 mg/dL — ABNORMAL HIGH (ref 6–23)
CO2: 28 mEq/L (ref 19–32)
Calcium: 9 mg/dL (ref 8.4–10.5)
GFR calc Af Amer: 49 mL/min — ABNORMAL LOW (ref >90)
GFR calc non Af Amer: 43 mL/min — ABNORMAL LOW (ref >90)
Glucose, Bld: 138 mg/dL — ABNORMAL HIGH (ref 70–99)
Sodium: 138 mEq/L (ref 135–145)
Total Protein: 6 g/dL (ref 6.0–8.3)

## 2012-08-20 LAB — CBC
HCT: 31.4 % — ABNORMAL LOW (ref 36.0–46.0)
Hemoglobin: 10.9 g/dL — ABNORMAL LOW (ref 12.0–15.0)
MCH: 28.3 pg (ref 26.0–34.0)
MCHC: 34.7 g/dL (ref 30.0–36.0)
MCV: 81.6 fL (ref 78.0–100.0)
RDW: 13.4 % (ref 11.5–15.5)

## 2012-08-20 LAB — IRON AND TIBC
Iron: 135 ug/dL (ref 42–135)
UIBC: 149 ug/dL (ref 125–400)

## 2012-08-20 LAB — C3 COMPLEMENT: C3 Complement: 118 mg/dL (ref 90–180)

## 2012-08-20 LAB — GLUCOSE, CAPILLARY: Glucose-Capillary: 127 mg/dL — ABNORMAL HIGH (ref 70–99)

## 2012-08-20 LAB — C4 COMPLEMENT: Complement C4, Body Fluid: 28 mg/dL (ref 10–40)

## 2012-08-20 MED ORDER — ONDANSETRON HCL 4 MG/2ML IJ SOLN
4.0000 mg | Freq: Four times a day (QID) | INTRAMUSCULAR | Status: DC
  Administered 2012-08-21 – 2012-08-25 (×16): 4 mg via INTRAVENOUS
  Filled 2012-08-20 (×15): qty 2

## 2012-08-20 MED ORDER — DEXAMETHASONE SODIUM PHOSPHATE 4 MG/ML IJ SOLN
4.0000 mg | Freq: Two times a day (BID) | INTRAMUSCULAR | Status: DC
  Administered 2012-08-21 – 2012-08-24 (×7): 4 mg via INTRAVENOUS
  Filled 2012-08-20 (×8): qty 1

## 2012-08-20 NOTE — Progress Notes (Signed)
Left message for Dr. Franky Macho regarding patient's acyclovir. Nephrologist recommending discontinuing it.  Awaiting return call.  Lance Bosch, RN

## 2012-08-20 NOTE — Progress Notes (Signed)
Patient ID: Kaoru Benda, female   DOB: [birth year], 52 y.o.   MRN: [id number] BP 150/80  Pulse 55  Temp(Src) 98.6 F (37 C) (Oral)  Resp 18  Ht 5\' 3"  (1.6 m)  Wt 88.905 kg (196 lb)  BMI 34.73 kg/m2  SpO2 98%  Intake/Output Summary (Last 24 hours) at 08/20/12 2033 Last data filed at 08/20/12 1946  Gross per 24 hour  Intake   1814 ml  Output   3950 ml  Net  -2136 ml   Alert and oriented x 4 Creatinine remains elevated. Acyclovir stopped today.  Negative fluid balance  Currently having stomach cramps.  Decrease decadron-most likely reason for persistent white count elevation.

## 2012-08-20 NOTE — Progress Notes (Signed)
Subjective: Interval History: has complaints Nausea.  Objective: Vital signs in last 24 hours: Temp:  [97.9 F (36.6 C)-98.4 F (36.9 C)] 98 F (36.7 C) (04/24 0600) Pulse Rate:  [56-63] 63 (04/24 0600) Resp:  [16-17] 16 (04/24 0600) BP: (137-161)/(79-83) 137/83 mmHg (04/24 0600) SpO2:  [95 %-99 %] 98 % (04/24 0600) Weight change:   Intake/Output from previous day: 04/23 0701 - 04/24 0700 In: 854 [P.O.:740; IV Piggyback:114] Out: 2775 [Urine:2775] Intake/Output this shift: Total I/O In: -  Out: 200 [Urine:200]  General appearance: alert and cooperative Resp: clear to auscultation bilaterally Cardio: S1, S2 normal GI: soft,pos bs, liver down 2 cm Extremities: extremities normal, atraumatic, no cyanosis or edema  Lab Results:  Recent Labs  08/18/12 1200 08/20/12 0402  WBC 19.7* 18.3*  HGB 10.9* 10.9*  HCT 31.3* 31.4*  PLT 336 308   BMET:  Recent Labs  08/19/12 0500 08/20/12 0402  NA 138 138  K 3.8 4.1  CL 101 102  CO2 28 28  GLUCOSE 155* 138*  BUN 31* 33*  CREATININE 1.43* 1.40*  CALCIUM 9.0 9.0   No results found for this basename: PTH,  in the last 72 hours Iron Studies:  Recent Labs  08/19/12 2011  IRON 135  TIBC 284    Studies/Results: US Renal  08/19/2012  *RADIOLOGY REPORT*  Clinical Data: 52 year old female with acute renal insufficiency. Hypertension.  RENAL/URINARY TRACT ULTRASOUND COMPLETE  Comparison:  12/06/2011.  CT abdomen and pelvis without contrast 11/24/2011.  Findings:  Right Kidney:  No hydronephrosis.  Increased renal cortical echogenicity, progressed.  Renal length 11.8 cm.  Left Kidney:  No hydronephrosis.  Renal length 10.2 cm.  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  Increased cortical echotexture stable or mildly progressed since August.  Bladder:  Remarkable for a small round cystic area along the posterior wall without vascularity (image 40).  This measures up to 2 cm diameter.  IMPRESSION: 1.  No acute  renal findings.  Increased cortical echogenicity compatible with chronic medical renal disease. 2.  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 This could be associated with the left side with renal collecting system duplication described on the 11/24/2011 CT.   Original Report Authenticated By: Erskine Speed, M.D.     I have reviewed the patient's current medications.  Assessment/Plan: 1 AKI secondary to toxic injury, contrast +/- Acyclovir.  Would d/c acyclovir with her neg c&s.  Should recover.  Vol/acid/base/K ok 2 HTN naturesis with HTN, bp better  P Rec stop Acyclovir, cont other meds.    LOS: 10 days   Teddie Curd L 08/20/2012,11:34 AM

## 2012-08-21 LAB — RENAL FUNCTION PANEL
Albumin: 2.9 g/dL — ABNORMAL LOW (ref 3.5–5.2)
BUN: 32 mg/dL — ABNORMAL HIGH (ref 6–23)
Chloride: 100 mEq/L (ref 96–112)
Creatinine, Ser: 1.41 mg/dL — ABNORMAL HIGH (ref 0.50–1.10)
GFR calc non Af Amer: 42 mL/min — ABNORMAL LOW (ref >90)
Phosphorus: 3.9 mg/dL (ref 2.3–4.6)

## 2012-08-21 LAB — CBC
HCT: 32.3 % — ABNORMAL LOW (ref 36.0–46.0)
MCHC: 35 g/dL (ref 30.0–36.0)
MCV: 81 fL (ref 78.0–100.0)
Platelets: 273 10*3/uL (ref 150–400)
RDW: 13.6 % (ref 11.5–15.5)
WBC: 16.8 10*3/uL — ABNORMAL HIGH (ref 4.0–10.5)

## 2012-08-21 LAB — AMYLASE: Amylase: 78 U/L (ref 0–105)

## 2012-08-21 MED ORDER — SODIUM CHLORIDE 0.9 % IV SOLN
INTRAVENOUS | Status: DC
  Administered 2012-08-21: 13:00:00 via INTRAVENOUS

## 2012-08-21 MED ORDER — PANTOPRAZOLE SODIUM 40 MG PO TBEC
40.0000 mg | DELAYED_RELEASE_TABLET | Freq: Two times a day (BID) | ORAL | Status: DC
  Administered 2012-08-21 – 2012-08-25 (×9): 40 mg via ORAL
  Filled 2012-08-21 (×9): qty 1

## 2012-08-21 NOTE — Progress Notes (Signed)
Patient ID: [name] [name], female   DOB: November 14, 1960, 52 y.o.   MRN: [id number] BP 148/82  Pulse 55  Temp(Src) 97.9 F (36.6 C) (Oral)  Resp 16  Ht 5\' 3"  (1.6 m)  Wt 88.905 kg (196 lb)  BMI 34.73 kg/m2  SpO2 99% Alert and oriented x 4 Cramping is improved.  Probable discharge next week Appreciate renal help.  Neuro intact and stable.

## 2012-08-21 NOTE — Progress Notes (Signed)
Subjective: Interval History: has complaints stomach cramping.  Objective: Vital signs in last 24 hours: Temp:  [98.2 F (36.8 C)-99 F (37.2 C)] 99 F (37.2 C) (04/25 0511) Pulse Rate:  [51-61] 61 (04/25 0511) Resp:  [18-20] 20 (04/25 0511) BP: (150-167)/(73-86) 150/84 mmHg (04/25 0511) SpO2:  [98 %-99 %] 99 % (04/25 0511) Weight change:   Intake/Output from previous day: 04/24 0701 - 04/25 0700 In: 1870 [P.O.:1820; IV Piggyback:50] Out: 3600 [Urine:3600] Intake/Output this shift:    General appearance: alert, cooperative and mildly obese Resp: clear to auscultation bilaterally Cardio: regular rate and rhythm, S1, S2 normal, no murmur, click, rub or gallop GI: soft, non-tender; bowel sounds normal; no masses,  no organomegaly Extremities: extremities normal, atraumatic, no cyanosis or edema Skin: Skin color, texture, turgor normal. 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   08/19/2012  *RADIOLOGY REPORT*  Clinical Data: 52 year old female with acute renal insufficiency. Hypertension.  RENAL/URINARY TRACT ULTRASOUND COMPLETE  Comparison:  12/06/2011.  CT abdomen and pelvis without contrast 11/24/2011.  Findings:  Right Kidney:  No hydronephrosis.  Increased renal cortical echogenicity, progressed.  Renal length 11.8 cm.  Left Kidney:  No  hydronephrosis.  Renal length 10.2 cm.  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  Increased cortical echotexture stable or mildly progressed since August.  Bladder:  Remarkable for a small round cystic area along the posterior wall without vascularity (image 40).  This measures up to 2 cm diameter.  IMPRESSION: 1.  No acute renal findings.  Increased cortical echogenicity compatible with chronic medical renal disease. 2.  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 This could be associated with the left side with renal collecting system duplication described on the 11/24/2011 CT.   Original Report Authenticated By: Erskine Speed, M.D.     I have reviewed the patient's current medications.  Assessment/Plan: 1 AKI stable.  Vol appears ok but thirsty so will give slow ivf. 2 Abdm cramping ? AB but no D,  Will check pancreatic enz, and use PPI 3 Meningitis cleared 4 Obesity 5 HTN mildly ^ P ivf, PPI, check enz    LOS: 11 days   Caldwell Kronenberger L 08/21/2012,11:03 AM

## 2012-08-22 LAB — COMPREHENSIVE METABOLIC PANEL
ALT: 15 U/L (ref 0–35)
AST: 15 U/L (ref 0–37)
Albumin: 3 g/dL — ABNORMAL LOW (ref 3.5–5.2)
Alkaline Phosphatase: 39 U/L (ref 39–117)
CO2: 24 mEq/L (ref 19–32)
Chloride: 102 mEq/L (ref 96–112)
GFR calc non Af Amer: 40 mL/min — ABNORMAL LOW (ref >90)
Potassium: 4 mEq/L (ref 3.5–5.1)
Sodium: 137 mEq/L (ref 135–145)
Total Bilirubin: 0.5 mg/dL (ref 0.3–1.2)

## 2012-08-22 LAB — CBC
MCH: 28.2 pg (ref 26.0–34.0)
MCHC: 34.7 g/dL (ref 30.0–36.0)
MCV: 81.3 fL (ref 78.0–100.0)
Platelets: 257 10*3/uL (ref 150–400)
RBC: 4.01 MIL/uL (ref 3.87–5.11)
RDW: 13.6 % (ref 11.5–15.5)

## 2012-08-22 LAB — PHOSPHORUS: Phosphorus: 3.4 mg/dL (ref 2.3–4.6)

## 2012-08-22 NOTE — Progress Notes (Signed)
Patient ID: [name] [name], female   DOB: 09-27-1960, 52 y.o.   MRN: [id number] Subjective:  the patient is alert and pleasant.  Objective: Vital signs in last 24 hours: Temp:  [97.8 F (36.6 C)-98.4 F (36.9 C)] 97.8 F (36.6 C) (04/26 0600) Pulse Rate:  [52-64] 64 (04/26 0600) Resp:  [16-18] 16 (04/26 0600) BP: (115-156)/(62-84) 132/83 mmHg (04/26 0600) SpO2:  [98 %-100 %] 99 % (04/26 0600)  Intake/Output from previous day: 04/25 0701 - 04/26 0700 In: 2708.8 [P.O.:1320; I.V.:1288.8; IV Piggyback:100] Out: 2700 [Urine:2700] Intake/Output this shift:    Physical exam the patient is alert and oriented. She is moving all 4 extremities well.  Lab Results:  Recent Labs  08/21/12 0650 08/22/12 0534  WBC 16.8* 15.9*  HGB 11.3* 11.3*  HCT 32.3* 32.6*  PLT 273 257   BMET  Recent Labs  08/21/12 0650 08/22/12 0534  NA 137 137  K 3.7 4.0  CL 100 102  CO2 26 24  GLUCOSE 116* 115*  BUN 32* 30*  CREATININE 1.41* 1.47*  CALCIUM 8.8 8.9    Studies/Results: No results found.  Assessment/Plan: Meningitis: The patient is stable and getting antibiotics.   LOS: 12 days     [name] [name] D 08/22/2012, 8:53 AM

## 2012-08-22 NOTE — Progress Notes (Signed)
Pt c/o headache and says it's around where the plate is in her head. Pt wanted Rn to inform MD about the headache. On- call MD notified and no new orders received. Pt adm 650mg  PRN tylenol was helpful. Will continue to monitor quietly.

## 2012-08-22 NOTE — Progress Notes (Signed)
Subjective: Interval History: has complaints stom better.  Objective: Vital signs in last 24 hours: Temp:  [97.8 F (36.6 C)-98.1 F (36.7 C)] 97.8 F (36.6 C) (04/26 0600) Pulse Rate:  [52-64] 64 (04/26 0600) Resp:  [16-18] 16 (04/26 0600) BP: (115-156)/(62-83) 132/83 mmHg (04/26 0600) SpO2:  [98 %-100 %] 99 % (04/26 0600) Weight change:   Intake/Output from previous day: 04/25 0701 - 04/26 0700 In: 2708.8 [P.O.:1320; I.V.:1288.8; IV Piggyback:100] Out: 2700 [Urine:2700] Intake/Output this shift:    General appearance: alert, cooperative and no distress Resp: clear to auscultation bilaterally Cardio: S1, S2 normal and systolic murmur: holosystolic 2/6, blowing at apex GI: pos bs, soft, liver down 2 cm Extremities: extremities normal, atraumatic, no cyanosis or edema  Lab Results:  Recent Labs  08/21/12 0650 08/22/12 0534  WBC 16.8* 15.9*  HGB 11.3* 11.3*  HCT 32.3* 32.6*  PLT 273 257   BMET:  Recent Labs  08/21/12 0650 08/22/12 0534  NA 137 137  K 3.7 4.0  CL 100 102  CO2 26 24  GLUCOSE 116* 115*  BUN 32* 30*  CREATININE 1.41* 1.47*  CALCIUM 8.8 8.9   No results found for this basename: PTH,  in the last 72 hours Iron Studies:  Recent Labs  08/19/12 2011  IRON 135  TIBC 284    Studies/Results: No results found.  I have reviewed the patient's current medications.  Assessment/Plan: 1 AKI stable. Vol ok. Acid/base/k ok 2 Abdm pain enz ok. PPI has helped 3 Menigitis per NS 4 anemia stable P ivf, ppi, will follow 1-2 more days.    LOS: 12 days   Sheranda Seabrooks L 08/22/2012,10:29 AM

## 2012-08-23 LAB — RENAL FUNCTION PANEL
Albumin: 2.9 g/dL — ABNORMAL LOW (ref 3.5–5.2)
Calcium: 8.9 mg/dL (ref 8.4–10.5)
Creatinine, Ser: 1.49 mg/dL — ABNORMAL HIGH (ref 0.50–1.10)
GFR calc non Af Amer: 40 mL/min — ABNORMAL LOW (ref >90)

## 2012-08-23 LAB — CBC
MCH: 27.9 pg (ref 26.0–34.0)
MCHC: 34.4 g/dL (ref 30.0–36.0)
MCV: 81.2 fL (ref 78.0–100.0)
Platelets: 248 10*3/uL (ref 150–400)
RDW: 14 % (ref 11.5–15.5)

## 2012-08-23 NOTE — Progress Notes (Signed)
Subjective: Interval History: none.  Objective: Vital signs in last 24 hours: Temp:  [97.6 F (36.4 C)-98 F (36.7 C)] 98 F (36.7 C) (04/27 0981) Pulse Rate:  [53-64] 57 (04/27 0642) Resp:  [18] 18 (04/27 0642) BP: (130-148)/(83-93) 148/85 mmHg (04/27 0642) SpO2:  [96 %-100 %] 100 % (04/27 0642) Weight change:   Intake/Output from previous day: 04/26 0701 - 04/27 0700 In: 1580 [P.O.:1580] Out: 950 [Urine:950] Intake/Output this shift:    General appearance: alert, cooperative, no distress and mildly obese Resp: clear to auscultation bilaterally Cardio: regular rate and rhythm, S1, S2 normal, no murmur, click, rub or gallop GI: soft, non-tender; bowel sounds normal; no masses,  no organomegaly Extremities: edema Tr  Lab Results:  Recent Labs  08/22/12 0534 08/23/12 0615  WBC 15.9* 16.5*  HGB 11.3* 11.0*  HCT 32.6* 32.0*  PLT 257 248   BMET:  Recent Labs  08/22/12 0534 08/23/12 0615  NA 137 138  K 4.0 3.9  CL 102 103  CO2 24 25  GLUCOSE 115* 115*  BUN 30* 29*  CREATININE 1.47* 1.49*  CALCIUM 8.9 8.9   No results found for this basename: PTH,  in the last 72 hours Iron Studies: No results found for this basename: IRON, TIBC, TRANSFERRIN, FERRITIN,  in the last 72 hours  Studies/Results: No results found.  I have reviewed the patient's current medications.  Assessment/Plan: 1 AKI stable.  May take a while to come down and a chance she may not.  No need to stay in hosp  for that, can get f/u labs with Dr. Garner Nash (LMD) or with [place] .  Vol, acid/base, K ok 2 HTN fair control 3 Menigitis per NS P D/C ivf, will s/o at this time    LOS: 13 days   Goldy Calandra L 08/23/2012,10:25 AM

## 2012-08-23 NOTE — Progress Notes (Signed)
Patient ID: Kassity Woodson, female   DOB: 1961/01/29, 52 y.o.   MRN: [id number] Subjective: Patient reports she is feeling better.  Objective: Vital signs in last 24 hours: Temp:  [97.6 F (36.4 C)-98 F (36.7 C)] 98 F (36.7 C) (04/27 0642) Pulse Rate:  [53-60] 60 (04/27 1051) Resp:  [18] 18 (04/27 0642) BP: (130-148)/(83-93) 138/88 mmHg (04/27 1051) SpO2:  [96 %-100 %] 100 % (04/27 0642)  Intake/Output from previous day: 04/26 0701 - 04/27 0700 In: 1580 [P.O.:1580] Out: 950 [Urine:950] Intake/Output this shift: Total I/O In: 360 [P.O.:360] Out: 325 [Urine:325]  Neurologic: Grossly normal  Lab Results: Lab Results  Component Value Date   WBC 16.5* 08/23/2012   HGB 11.0* 08/23/2012   HCT 32.0* 08/23/2012   MCV 81.2 08/23/2012   PLT 248 08/23/2012   No results found for this basename: INR, PROTIME   BMET Lab Results  Component Value Date   NA 138 08/23/2012   K 3.9 08/23/2012   CL 103 08/23/2012   CO2 25 08/23/2012   GLUCOSE 115* 08/23/2012   BUN 29* 08/23/2012   CREATININE 1.49* 08/23/2012   CALCIUM 8.9 08/23/2012    Studies/Results: No results found.  Assessment/Plan:  Seems to be doing well. Creatinine up slightly, white count up slightly. Continue antibiotics.   LOS: 13 days    JONES,DAVID S 08/23/2012, 12:17 PM

## 2012-08-24 MED ORDER — DEXAMETHASONE SODIUM PHOSPHATE 4 MG/ML IJ SOLN
4.0000 mg | Freq: Every morning | INTRAMUSCULAR | Status: DC
  Administered 2012-08-25: 4 mg via INTRAVENOUS
  Filled 2012-08-24 (×3): qty 1

## 2012-08-24 MED ORDER — DEXAMETHASONE SODIUM PHOSPHATE 4 MG/ML IJ SOLN: 4.0000 mg | Freq: Every morning | INTRAMUSCULAR | Status: DC

## 2012-08-24 MED ORDER — DEXAMETHASONE 4 MG PO TABS: 4.0000 mg | ORAL_TABLET | Freq: Every morning | ORAL | Status: DC

## 2012-08-25 LAB — GLUCOSE, CAPILLARY

## 2012-08-25 MED ORDER — DEXAMETHASONE 4 MG PO TABS: 4.0000 mg | ORAL_TABLET | Freq: Every morning | ORAL | Status: DC

## 2012-08-25 NOTE — Progress Notes (Signed)
Patient called me to the room stating that her eyes was bubbling. Looked at patient eyes and it appear to have a bubble in the corner of each sclera. Dr. Jeanene Erb, he stated he would look at it this morning. He will also see about removing the PICC and changing her Decadron IV to PO for home. Patient is to be discharge today. Will pass information on  to the next nurse. Burnett Corrente, RN

## 2012-08-25 NOTE — Progress Notes (Signed)
Patient ID: [name] [name], female   DOB: 23-Aug-1960, 52 y.o.   MRN: [id number] Patient awoke with "bubbling in her eyes" she says she was washing her face but she did not get any soap it however when she was dry centimeters she saw part of the wiper dry bubbling out. She denies any vision changes denies any pain associated with it as it having a headache center in her incision since about 4 AM.  On exam upon patient awake alert oriented strength out of 5 wound clean and dry there is a little bit of scleral edema laterally it does not appear to be injected her vision does not appear to be impaired extraocular movements are intact would treat with eyedrops and will discuss with Dr. Franky Macho.

## 2012-08-25 NOTE — Progress Notes (Signed)
PICC line removed, V/S stable, education and follow up information completed. To be discharged home with friend. Tameria Patti, [place], RN

## 2012-08-29 ENCOUNTER — Encounter (HOSPITAL_COMMUNITY): Payer: Self-pay

## 2012-08-29 ENCOUNTER — Emergency Department (HOSPITAL_COMMUNITY)
Admission: EM | Admit: 2012-08-29 | Discharge: 2012-08-29 | Disposition: A | Discharge status: Home / Self Care | Payer: BC Managed Care – PPO | Attending: Emergency Medicine | Admitting: Emergency Medicine

## 2012-08-29 ENCOUNTER — Other Ambulatory Visit: Payer: Self-pay

## 2012-08-29 DIAGNOSIS — N189 Chronic kidney disease, unspecified: Secondary | ICD-10-CM | POA: Insufficient documentation

## 2012-08-29 DIAGNOSIS — E785 Hyperlipidemia, unspecified: Secondary | ICD-10-CM | POA: Insufficient documentation

## 2012-08-29 DIAGNOSIS — Z862 Personal history of diseases of the blood and blood-forming organs and certain disorders involving the immune mechanism: Secondary | ICD-10-CM | POA: Insufficient documentation

## 2012-08-29 DIAGNOSIS — Z8639 Personal history of other endocrine, nutritional and metabolic disease: Secondary | ICD-10-CM | POA: Insufficient documentation

## 2012-08-29 DIAGNOSIS — E669 Obesity, unspecified: Secondary | ICD-10-CM | POA: Insufficient documentation

## 2012-08-29 DIAGNOSIS — R0602 Shortness of breath: Secondary | ICD-10-CM | POA: Insufficient documentation

## 2012-08-29 DIAGNOSIS — Z8542 Personal history of malignant neoplasm of other parts of uterus: Secondary | ICD-10-CM | POA: Insufficient documentation

## 2012-08-29 DIAGNOSIS — IMO0002 Reserved for concepts with insufficient information to code with codable children: Secondary | ICD-10-CM | POA: Insufficient documentation

## 2012-08-29 DIAGNOSIS — Z8669 Personal history of other diseases of the nervous system and sense organs: Secondary | ICD-10-CM | POA: Insufficient documentation

## 2012-08-29 DIAGNOSIS — K219 Gastro-esophageal reflux disease without esophagitis: Secondary | ICD-10-CM | POA: Insufficient documentation

## 2012-08-29 DIAGNOSIS — Z8719 Personal history of other diseases of the digestive system: Secondary | ICD-10-CM | POA: Insufficient documentation

## 2012-08-29 DIAGNOSIS — R002 Palpitations: Secondary | ICD-10-CM | POA: Insufficient documentation

## 2012-08-29 DIAGNOSIS — Z8619 Personal history of other infectious and parasitic diseases: Secondary | ICD-10-CM | POA: Insufficient documentation

## 2012-08-29 DIAGNOSIS — Z87891 Personal history of nicotine dependence: Secondary | ICD-10-CM | POA: Insufficient documentation

## 2012-08-29 DIAGNOSIS — Z8709 Personal history of other diseases of the respiratory system: Secondary | ICD-10-CM | POA: Insufficient documentation

## 2012-08-29 DIAGNOSIS — Z79899 Other long term (current) drug therapy: Secondary | ICD-10-CM | POA: Insufficient documentation

## 2012-08-29 DIAGNOSIS — I129 Hypertensive chronic kidney disease with stage 1 through stage 4 chronic kidney disease, or unspecified chronic kidney disease: Secondary | ICD-10-CM | POA: Insufficient documentation

## 2012-08-29 LAB — CBC WITH DIFFERENTIAL/PLATELET
Basophils Absolute: 0 10*3/uL (ref 0.0–0.1)
HCT: 32.3 % — ABNORMAL LOW (ref 36.0–46.0)
Lymphocytes Relative: 8 % — ABNORMAL LOW (ref 12–46)
Monocytes Absolute: 1.3 10*3/uL — ABNORMAL HIGH (ref 0.1–1.0)
Neutro Abs: 17.4 10*3/uL — ABNORMAL HIGH (ref 1.7–7.7)
Neutrophils Relative %: 85 % — ABNORMAL HIGH (ref 43–77)
RDW: 14.8 % (ref 11.5–15.5)
WBC: 20.4 10*3/uL — ABNORMAL HIGH (ref 4.0–10.5)

## 2012-08-29 LAB — BASIC METABOLIC PANEL
Chloride: 102 mEq/L (ref 96–112)
Creatinine, Ser: 1.22 mg/dL — ABNORMAL HIGH (ref 0.50–1.10)
GFR calc Af Amer: 58 mL/min — ABNORMAL LOW (ref >90)
GFR calc non Af Amer: 50 mL/min — ABNORMAL LOW (ref >90)
Potassium: 3.4 mEq/L — ABNORMAL LOW (ref 3.5–5.1)

## 2012-08-29 LAB — TROPONIN I: Troponin I: <0.3 ng/mL (ref <0.30)

## 2012-08-29 NOTE — ED Notes (Signed)
MD at bedside. Dr Adriana Simas at bedside discussing plan of care after examining pt.

## 2012-08-29 NOTE — ED Notes (Signed)
Pt states does not have pain, " feels  pressure in her chest". Pt reports feeling like her heart racing. VS WNL at this time.  Denies N/V, diaphoresis and radiating pain from heart. NAD noted at this time.

## 2012-08-29 NOTE — ED Provider Notes (Signed)
History  This chart was scribed for [name] Hutching, MD by [name], ED Scribe. This patient was seen in room APA18/APA18 and the patient's care was started at 3:41 PM.  CSN: [id number]  Arrival date & time 08/29/12  1535   First MD Initiated Contact with Patient 08/29/12 1541      Chief Complaint  Patient presents with  . Chest Pain    describes as tightness in chest, never had pain.      The history is provided by the patient. No language interpreter was used.    HPI Comments: [name] [name] is a 52 y.o. female brought in by ambulance, who presents to the Emergency Department complaining of sudden onset, intermittent, now resolved palpitations described as racing heart rate with associated non-radiating, central chest pressure and SOB that started around 2 AM this morning. She denies all symptoms currently and is unsure how long the episode lasted. Pt reports that she measured her HR at 128 and BP was "high" at the time. She states that she took metoprolol which she denies is a new medication, with improvement in her symptoms. She reports that she went back to sleep and woke up with a HR of 66 and a BP of 190/104. She states that she called her PCP and was advised to come to his office. BP was 170/110 in his office and rechecked at 170/100 manually. She was given a new medication hydralazine and told to stop using decadron yesterday. She reports that she filled the medication and took one dose before laying down.  While laying down, HR increased to 104 and at which point she called EMS. EMS administered 1 NTG and 4 baby ASA en route with no improvement in the chest pressure. Upon arrival, HR is 76 and BP is 122/78. Pt reports one prior episode of similar symptoms 3 years ago that she was evaluated for at [facility] and was started on metoprolol. She admits that the HR is typically improved with metoprolol. In the past month, she reports 4 episodes of tachycardia. She states that her BP at  baseline was between 114 to 120 over 68 to 75 until her aneurysm surgery where afterwards has been mildly evaluated.  Pt was recently admitted for 16 days for a positive lumbar puncture with an associated contrast neuropathy. She states that upon admission she was also diagnosed with renal failure. She states upon discharge 4 days ago, she had an elevated BUN and creatinine and has been checking her vitals insistently since. She denies diaphoresis, nausea and emesis as associated symptoms.  She has a h/o HTN, HLD and GERD. Pt is a former smoker but denies alcohol use.  [facility] is [name] PCP is [name]  Past Medical History  Diagnosis Date  . Overweight     Obesity  . Hypertension   . Hyperlipidemia   . Palpitations   . Chest pain     denied on 06/18/2012  . Hypoglycemia   . Uterine fibroid     pt. states that she has a "closed cervix"   . Sinusitis, acute     seen by PCP- 06/17/2012, will treat /w augmentin & tussin/DM  . H/O hiatal hernia   . Headache     using tylenol prn  . Aneurysm     brain  . Normal cardiac stress test     pt. released fr. [name] care in 10/2011, all findings w/i normal limits  . H. pylori infection     2012-  stomach biopsy  . GERD (gastroesophageal reflux disease)     h/o colitis   . Family history of anesthesia complication     brother & neice "flat lined" during induction: neice d/t a medication given for a blood clotting problem; brother d/t "miscalculated amount of anesthesia"  . Chronic kidney disease     L - upper pole- defect, doesn't effect       Past Surgical History  Procedure Laterality Date  . Colonoscopy    . Lipoma excision      buttock 10/2010  . Craniotomy Right 06/24/2012    Procedure: CRANIOTOMY INTRACRANIAL ANEURYSM FOR CAROTID;  Surgeon: Carmela Hurt, MD;  Location: [facility];  Service: Neurosurgery;  Laterality: Right;  RIGHT Pterional Craniotomy for aneurysm    Family History  Problem Relation Age of Onset  .  Hypertension Other   . Coronary artery disease Other     History  Substance Use Topics  . Smoking status: Former Smoker -- 0.80 packs/day for 15 years    Types: Cigarettes    Quit date: 04/28/2004  . Smokeless tobacco: Former Neurosurgeon    Quit date: 06/19/2003  . Alcohol Use: No    No OB history provided.  Review of Systems  A complete 10 system review of systems was obtained and all systems are negative except as noted in the HPI and PMH.   Allergies  Zithromax; Ace inhibitors; Ciprofloxacin; Other; Medrol; and Tetracyclines & related  Home Medications   Current Outpatient Rx  Name  Route  Sig  Dispense  Refill  . Cholecalciferol (D 1000) 1000 UNITS tablet   Oral   Take 1,000 Units by mouth every evening.          [name] dexamethasone (DECADRON) 4 MG tablet   Oral   Take 1 tablet (4 mg total) by mouth every morning.   3 tablet   0   . diltiazem (CARDIZEM CD) 180 MG 24 hr capsule   Oral   Take 180 mg by mouth at bedtime.          [name] ibuprofen (ADVIL,MOTRIN) 200 MG tablet   Oral   Take 200 mg by mouth daily as needed for pain.         [name] loratadine (CLARITIN) 10 MG tablet   Oral   Take 10 mg by mouth daily before breakfast.          . lovastatin (MEVACOR) 20 MG tablet   Oral   Take 20 mg by mouth at bedtime.         . ondansetron (ZOFRAN-ODT) 8 MG disintegrating tablet   Oral   Take 8 mg by mouth every 8 (eight) hours as needed for nausea.         [name] oxyCODONE-acetaminophen (PERCOCET/ROXICET) 5-325 MG per tablet   Oral   Take 1 tablet by mouth every 4 (four) hours as needed for pain.   20 tablet   0     Triage Vitals: BP 122/78  Pulse 76  Temp(Src) 98.2 F (36.8 C) (Oral)  Resp 20  Ht 5\' 3"  (1.6 m)  Wt 189 lb (85.73 kg)  BMI 33.49 kg/m2  SpO2 97%  Physical Exam  Nursing note and vitals reviewed. Constitutional: She is oriented to person, place, and time. She appears well-developed and well-nourished.  HENT:  Head: Normocephalic and  atraumatic.  Eyes: Conjunctivae and EOM are normal. Pupils are equal, round, and reactive to light.  Neck: Normal range of motion. Neck supple.  Cardiovascular: Normal rate, regular rhythm and normal heart sounds.   Pulmonary/Chest: Effort normal and breath sounds normal.  Abdominal: Soft. Bowel sounds are normal.  Musculoskeletal: Normal range of motion.  Neurological: She is alert and oriented to person, place, and time.  Skin: Skin is warm and dry.  Psychiatric: She has a normal mood and affect.    ED Course  Procedures (including critical care time)  DIAGNOSTIC STUDIES: Oxygen Saturation is 97% on room air, normal by my interpretation.    COORDINATION OF CARE: 3:53 PM-Advised pt that her vitals are currently stable. Informed pt that her going off the steroid yesterday could affect BP. Discussed treatment plan which includes cardiac monitor, EKG, CBC panel, BMP and troponin with pt at bedside and pt agreed to plan.   Labs Reviewed  BASIC METABOLIC PANEL - Abnormal; Notable for the following:    Potassium 3.4 (*)    Creatinine, Ser 1.22 (*)    GFR calc non Af Amer 50 (*)    GFR calc Af Amer 58 (*)    All other components within normal limits  CBC WITH DIFFERENTIAL - Abnormal; Notable for the following:    WBC 20.4 (*)    Hemoglobin 11.1 (*)    HCT 32.3 (*)    Neutrophils Relative 85 (*)    Neutro Abs 17.4 (*)    Lymphocytes Relative 8 (*)    Monocytes Absolute 1.3 (*)    All other components within normal limits  TROPONIN I   No results found.   No diagnosis found.   Date: 08/29/2012  Rate: 78  Rhythm: normal sinus rhythm  QRS Axis: normal  Intervals: normal  ST/T Wave abnormalities: normal  Conduction Disutrbances: none  Narrative Interpretation: unremarkable     MDM  Normal exam. Normal EKG. Normal troponin.   Patient has primary care followup.   I personally performed the services described in this documentation, which was scribed in my presence. The  recorded information has been reviewed and is accurate.      [name] Hutching, MD 08/29/12 1743

## 2012-08-29 NOTE — ED Notes (Signed)
Pt had "pressure on chest wall area, and rapid heart rate", just released from hosp on Tuesday -was treated for meningitis.   bp 170/100, heart rate 130.    Was given 1 ntg. And 4 baby asa.  ntg did not change the sensation. Sob at times. Denies any n/v, no fever.

## 2012-08-30 ENCOUNTER — Emergency Department (HOSPITAL_COMMUNITY): Payer: BC Managed Care – PPO

## 2012-08-30 ENCOUNTER — Encounter (HOSPITAL_COMMUNITY): Payer: Self-pay

## 2012-08-30 ENCOUNTER — Emergency Department (HOSPITAL_COMMUNITY)
Admission: EM | Admit: 2012-08-30 | Discharge: 2012-08-30 | Disposition: A | Discharge status: Home / Self Care | Payer: BC Managed Care – PPO | Attending: Emergency Medicine | Admitting: Emergency Medicine

## 2012-08-30 DIAGNOSIS — Z8719 Personal history of other diseases of the digestive system: Secondary | ICD-10-CM | POA: Insufficient documentation

## 2012-08-30 DIAGNOSIS — R51 Headache: Secondary | ICD-10-CM

## 2012-08-30 DIAGNOSIS — Z8709 Personal history of other diseases of the respiratory system: Secondary | ICD-10-CM | POA: Insufficient documentation

## 2012-08-30 DIAGNOSIS — K219 Gastro-esophageal reflux disease without esophagitis: Secondary | ICD-10-CM | POA: Insufficient documentation

## 2012-08-30 DIAGNOSIS — E785 Hyperlipidemia, unspecified: Secondary | ICD-10-CM | POA: Insufficient documentation

## 2012-08-30 DIAGNOSIS — N189 Chronic kidney disease, unspecified: Secondary | ICD-10-CM | POA: Insufficient documentation

## 2012-08-30 DIAGNOSIS — Z8639 Personal history of other endocrine, nutritional and metabolic disease: Secondary | ICD-10-CM | POA: Insufficient documentation

## 2012-08-30 DIAGNOSIS — R002 Palpitations: Secondary | ICD-10-CM | POA: Insufficient documentation

## 2012-08-30 DIAGNOSIS — Z8679 Personal history of other diseases of the circulatory system: Secondary | ICD-10-CM | POA: Insufficient documentation

## 2012-08-30 DIAGNOSIS — Z862 Personal history of diseases of the blood and blood-forming organs and certain disorders involving the immune mechanism: Secondary | ICD-10-CM | POA: Insufficient documentation

## 2012-08-30 DIAGNOSIS — Z87891 Personal history of nicotine dependence: Secondary | ICD-10-CM | POA: Insufficient documentation

## 2012-08-30 DIAGNOSIS — Z8742 Personal history of other diseases of the female genital tract: Secondary | ICD-10-CM | POA: Insufficient documentation

## 2012-08-30 DIAGNOSIS — I129 Hypertensive chronic kidney disease with stage 1 through stage 4 chronic kidney disease, or unspecified chronic kidney disease: Secondary | ICD-10-CM | POA: Insufficient documentation

## 2012-08-30 DIAGNOSIS — Z79899 Other long term (current) drug therapy: Secondary | ICD-10-CM | POA: Insufficient documentation

## 2012-08-30 LAB — CBC WITH DIFFERENTIAL/PLATELET
Basophils Absolute: 0 10*3/uL (ref 0.0–0.1)
Lymphocytes Relative: 11 % — ABNORMAL LOW (ref 12–46)
Lymphs Abs: 1.9 10*3/uL (ref 0.7–4.0)
Neutro Abs: 13.6 10*3/uL — ABNORMAL HIGH (ref 1.7–7.7)
Neutrophils Relative %: 82 % — ABNORMAL HIGH (ref 43–77)
Platelets: 144 10*3/uL — ABNORMAL LOW (ref 150–400)
RBC: 3.91 MIL/uL (ref 3.87–5.11)
RDW: 15 % (ref 11.5–15.5)
WBC: 16.6 10*3/uL — ABNORMAL HIGH (ref 4.0–10.5)

## 2012-08-30 LAB — BASIC METABOLIC PANEL
CO2: 24 mEq/L (ref 19–32)
Glucose, Bld: 96 mg/dL (ref 70–99)
Potassium: 3.4 mEq/L — ABNORMAL LOW (ref 3.5–5.1)
Sodium: 139 mEq/L (ref 135–145)

## 2012-08-30 LAB — URINE MICROSCOPIC-ADD ON

## 2012-08-30 LAB — URINALYSIS, ROUTINE W REFLEX MICROSCOPIC
Hgb urine dipstick: NEGATIVE
Nitrite: NEGATIVE
Protein, ur: NEGATIVE mg/dL
Specific Gravity, Urine: 1.01 (ref 1.005–1.030)
Urobilinogen, UA: 0.2 mg/dL (ref 0.0–1.0)

## 2012-08-30 MED ORDER — SODIUM CHLORIDE 0.9 % IV BOLUS (SEPSIS)
1000.0000 mL | Freq: Once | INTRAVENOUS | Status: AC
  Administered 2012-08-30: 1000 mL via INTRAVENOUS

## 2012-08-30 MED ORDER — HYDROCODONE-ACETAMINOPHEN 5-325 MG PO TABS: 2.0000 | ORAL_TABLET | ORAL | Status: DC | PRN

## 2012-08-30 MED ORDER — MORPHINE SULFATE 4 MG/ML IJ SOLN
2.0000 mg | Freq: Once | INTRAMUSCULAR | Status: AC
  Administered 2012-08-30: 2 mg via INTRAVENOUS
  Filled 2012-08-30: qty 1

## 2012-08-30 NOTE — ED Notes (Signed)
Pt presents to er with c/o headache and concerns of an elevated white count, was seen in er yesterday for headache, tachycardia and hypertension recently discharged from hospital for meningitis. After discharge for headache yesterday, pt went home, looked up her labs in my chart and is concerned because her white count is elevated. Does admit to recent course of steroids, pt also has medrol listed as allergy, when asked what the reaction was pt states that her hear trate increases Dr. Hyacinth Meeker at bedside,

## 2012-08-30 NOTE — ED Notes (Signed)
Pt reports headache since yesterday. Was seen in the ed yesterday and discharged.  Went home and checked her records on my chart and has "some concerns"

## 2012-08-30 NOTE — ED Provider Notes (Signed)
History  This chart was scribed for Vida Roller, MD by Shari Heritage, ED Scribe. The patient was seen in room APA03/APA03. Patient's care was started at 1226.   CSN: [id number]  Arrival date & time 08/30/12  1158   First MD Initiated Contact with Patient 08/30/12 1226      Chief Complaint  Patient presents with  . Headache     The history is provided by the patient. No language interpreter was used.     HPI Comments: [name] [name] is a 52 y.o. female with history of hypertension, palpitations, hyperlipidemia, aneurysm who presents to the Emergency Department complaining of improving, diffuse, pressure-like headache since yesterday after taking nitroglycerin during her visit to the ED. She also states that her palpitations have continued intermittently. Patient denies fever, vomiting, diarrhea, dysuria, difficulty urinating, leg swelling or any other symptoms today. Patient's current blood pressure medicines are Cardizem and metoprolol. She says that for the past few days, her blood pressure has been fluctuating. She saw her PCP yesterday who gave her hydralazine. She says that later in the day she began having palpitations and called EMS to her home. Per medical records, patient was seen here yesterday complaining of sudden intermittent palpitations that were resolved upon arrival in the ED with accompanying central chest pain. She also had elevated BP yesterday. Patient was given nitroglycerin in the ED. Patient was advised in the ED that her vitals were stable. Patient exam, troponin and other labs were unremarkable. Patient was admitted to hospital on 08/11/2012  for culture negative meningitis approximately 6 weeks after having a carotid aneurysm clipping. Patient was discharged on  08/17/2012. Patient went home on IV antibiotics. She reports that she finished this 14-day course of antibiotics. Patient was also on steroids during her hospital stay, but hasn't taken them since then. She has  had 4 CT scans since her surgery wihch are negative for acute changes.  She has also had contrast nephropathy during her inpatient stay which has gradually improved.  She has had steroids during her stay and during d/c period as well.  She is known to have palpitations and tachycardia when she takes solumedrol.  She denies fevers chills, n/v or stiff neck.  The headache is located in the R periorbital area and is not radiating to the neck.  No changes in vision.   Past Medical History  Diagnosis Date  . Overweight     Obesity  . Hypertension   . Hyperlipidemia   . Palpitations   . Chest pain     denied on 06/18/2012  . Hypoglycemia   . Uterine fibroid     pt. states that she has a "closed cervix"   . Sinusitis, acute     seen by PCP- 06/17/2012, will treat /w augmentin & tussin/DM  . H/O hiatal hernia   . Headache     using tylenol prn  . Aneurysm     brain  . Normal cardiac stress test     pt. released fr. [name] care in 10/2011, all findings w/i normal limits  . H. pylori infection     2012- stomach biopsy  . GERD (gastroesophageal reflux disease)     h/o colitis   . Family history of anesthesia complication     brother & neice "flat lined" during induction: neice d/t a medication given for a blood clotting problem; brother d/t "miscalculated amount of anesthesia"  . Chronic kidney disease     L - upper pole- defect,  doesn't effect       Past Surgical History  Procedure Laterality Date  . Colonoscopy    . Lipoma excision      buttock 10/2010  . Craniotomy Right 06/24/2012    Procedure: CRANIOTOMY INTRACRANIAL ANEURYSM FOR CAROTID;  Surgeon: Carmela Hurt, MD;  Location: [facility];  Service: Neurosurgery;  Laterality: Right;  RIGHT Pterional Craniotomy for aneurysm    Family History  Problem Relation Age of Onset  . Hypertension Other   . Coronary artery disease Other     History  Substance Use Topics  . Smoking status: Former Smoker -- 0.80 packs/day for 15  years    Types: Cigarettes    Quit date: 04/28/2004  . Smokeless tobacco: Former Neurosurgeon    Quit date: 06/19/2003  . Alcohol Use: No    OB History   Grav Para Term Preterm Abortions TAB SAB Ect Mult Living                  Review of Systems  All other systems reviewed and are negative.   A complete 10 system review of systems was obtained and all systems are negative except as noted in the HPI and PMH.   Allergies  Zithromax; Ace inhibitors; Ciprofloxacin; Other; Medrol; and Tetracyclines & related  Home Medications   Current Outpatient Rx  Name  Route  Sig  Dispense  Refill  . acetaminophen (TYLENOL) 500 MG tablet   Oral   Take 1,000 mg by mouth daily as needed for pain.         . Cholecalciferol (D 1000) 1000 UNITS tablet   Oral   Take 1,000 Units by mouth every evening.          . diltiazem (CARDIZEM CD) 180 MG 24 hr capsule   Oral   Take 180 mg by mouth at bedtime.          . hydrALAZINE (APRESOLINE) 50 MG tablet   Oral   Take 50 mg by mouth 2 (two) times daily.         [name] loratadine (CLARITIN) 10 MG tablet   Oral   Take 10 mg by mouth daily.          [name] lovastatin (MEVACOR) 20 MG tablet   Oral   Take 20 mg by mouth at bedtime.         . metoprolol (LOPRESSOR) 50 MG tablet   Oral   Take 50 mg by mouth 2 (two) times daily.         [name] omeprazole (PRILOSEC) 20 MG capsule   Oral   Take 20 mg by mouth daily.          [name] HYDROcodone-acetaminophen (NORCO/VICODIN) 5-325 MG per tablet   Oral   Take 2 tablets by mouth every 4 (four) hours as needed for pain.   10 tablet   0     Triage Vitals: BP 138/81  Pulse 95  Temp(Src) 100 F (37.8 C) (Oral)  Resp 18  Ht 5\' 3"  (1.6 m)  Wt 187 lb (84.823 kg)  BMI 33.13 kg/m2  SpO2 98%  Physical Exam  Nursing note and vitals reviewed. Constitutional: She appears well-developed and well-nourished. No distress.  HENT:  Head: Normocephalic and atraumatic.  Mouth/Throat: Oropharynx is clear and moist.  No oropharyngeal exudate.  Normal appearing face / head - hair growth starting in the R temporal area.  No lesions to the skin.  No redness, no drainage, no ttp.  Eyes:  Conjunctivae and EOM are normal. Pupils are equal, round, and reactive to light. Right eye exhibits no discharge. Left eye exhibits no discharge. No scleral icterus.  Neck: Normal range of motion. Neck supple. No JVD present. No thyromegaly present.  Cardiovascular: Normal rate, regular rhythm, normal heart sounds and intact distal pulses.  Exam reveals no gallop and no friction rub.   No murmur heard. Pulmonary/Chest: Effort normal and breath sounds normal. No respiratory distress. She has no wheezes. She has no rales.  Abdominal: Soft. Bowel sounds are normal. She exhibits no distension and no mass. There is no tenderness.  Musculoskeletal: Normal range of motion. She exhibits no edema and no tenderness.  Lymphadenopathy:    She has no cervical adenopathy.  Neurological: She is alert. Coordination normal.  The patient has normal speech, normal memory, normal coordination of all 4 extremities without any limb ataxia. The patient has normal strength of all 4 extremities at the major muscle groups including shoulders, biceps, triceps, forearm, grips, quadriceps, hamstrings, calves. Normal sensation to light touch and pinprick of all 4 extremities and the trunk. No truncal ataxia, normal gait, cranial nerves III through XII are intact.  Normal peripheral visual fields. Normal extraocular movements. Normal pupillary exam. No pronator drift, normal reflexes at the brachial radialis, biceps and patellar tendons. Normal position sense, normal temperature sensation to cold and warm  Skin: Skin is warm and dry. No rash noted. No erythema.  Psychiatric: She has a normal mood and affect. Her behavior is normal.    ED Course  Procedures (including critical care time) DIAGNOSTIC STUDIES: Oxygen Saturation is 98% on room air, normal by my  interpretation.    COORDINATION OF CARE: 3:07 PM- Patient informed of current plan for treatment and evaluation and agrees with plan at this time.       Labs Reviewed  URINALYSIS, ROUTINE W REFLEX MICROSCOPIC - Abnormal; Notable for the following:    Leukocytes, UA TRACE (*)    All other components within normal limits  CBC WITH DIFFERENTIAL - Abnormal; Notable for the following:    WBC 16.6 (*)    Hemoglobin 11.3 (*)    HCT 32.9 (*)    Platelets 144 (*)    Neutrophils Relative 82 (*)    Neutro Abs 13.6 (*)    Lymphocytes Relative 11 (*)    Monocytes Absolute 1.1 (*)    All other components within normal limits  BASIC METABOLIC PANEL - Abnormal; Notable for the following:    Potassium 3.4 (*)    Creatinine, Ser 1.25 (*)    GFR calc non Af Amer 49 (*)    GFR calc Af Amer 57 (*)    All other components within normal limits  URINE MICROSCOPIC-ADD ON - Abnormal; Notable for the following:    Squamous Epithelial / LPF FEW (*)    Bacteria, UA FEW (*)    All other components within normal limits  URINE CULTURE   Dg Chest Port 1 View  08/30/2012  *RADIOLOGY REPORT*  Clinical Data: Chest pain  CHEST - 1 VIEW  Comparison:  06/24/2012  Findings: The heart size and mediastinal contours are within normal limits.  Both lungs are clear.  IMPRESSION: No active disease.   Original Report Authenticated By: Judie Petit. Shick, M.D.      1. Headache       MDM  The patient appears well at this time, her signs reveal a temperature of 100.0, pulse of 95, respirations of 18, blood pressure of 138/81  and oxygen saturations of 98%. She did not have any signs or symptoms of meningitis however she does have a borderline fever. Her blood count yesterday was 20,000 with a left shift. Will check for sources of infection including urine and chest, there is no neck stiffness and only a mild headache. We'll start with a chest x-ray, urinalysis and repeat lab work.  I have personally interpreted and reviewed the  x-ray and find it to be no signs of pneumonias, pneumothorax, effusions or abnormal mediastinum. Vital signs show no fever, it has reduced from 37.8C to 36.8C. Her pulse is 85, blood pressure is 142/82 and she states that she is feeling better with regard to her right-sided headache. She has no changes in her vision, baseline neurologic status and is comfortable going home. I've explained her findings, urinalysis is clean, white blood cell counts improving from yesterday and at this time she appears stable for discharge.   I personally performed the services described in this documentation, which was scribed in my presence. The recorded information has been reviewed and is accurate.      Vida Roller, MD 08/30/12 [phone]

## 2012-08-30 NOTE — ED Notes (Signed)
Dr Miller at bedside,  

## 2012-08-31 LAB — URINE CULTURE: Culture: NO GROWTH

## 2012-09-01 ENCOUNTER — Emergency Department (HOSPITAL_COMMUNITY)
Admission: EM | Admit: 2012-09-01 | Discharge: 2012-09-01 | Disposition: A | Discharge status: Home / Self Care | Payer: BC Managed Care – PPO | Attending: Emergency Medicine | Admitting: Emergency Medicine

## 2012-09-01 ENCOUNTER — Encounter (HOSPITAL_COMMUNITY): Payer: Self-pay | Admitting: *Deleted

## 2012-09-01 DIAGNOSIS — Z8709 Personal history of other diseases of the respiratory system: Secondary | ICD-10-CM | POA: Insufficient documentation

## 2012-09-01 DIAGNOSIS — Z8679 Personal history of other diseases of the circulatory system: Secondary | ICD-10-CM | POA: Insufficient documentation

## 2012-09-01 DIAGNOSIS — I129 Hypertensive chronic kidney disease with stage 1 through stage 4 chronic kidney disease, or unspecified chronic kidney disease: Secondary | ICD-10-CM | POA: Insufficient documentation

## 2012-09-01 DIAGNOSIS — Z862 Personal history of diseases of the blood and blood-forming organs and certain disorders involving the immune mechanism: Secondary | ICD-10-CM | POA: Insufficient documentation

## 2012-09-01 DIAGNOSIS — Z79899 Other long term (current) drug therapy: Secondary | ICD-10-CM | POA: Insufficient documentation

## 2012-09-01 DIAGNOSIS — N189 Chronic kidney disease, unspecified: Secondary | ICD-10-CM | POA: Insufficient documentation

## 2012-09-01 DIAGNOSIS — K219 Gastro-esophageal reflux disease without esophagitis: Secondary | ICD-10-CM | POA: Insufficient documentation

## 2012-09-01 DIAGNOSIS — E785 Hyperlipidemia, unspecified: Secondary | ICD-10-CM | POA: Insufficient documentation

## 2012-09-01 DIAGNOSIS — Z8639 Personal history of other endocrine, nutritional and metabolic disease: Secondary | ICD-10-CM | POA: Insufficient documentation

## 2012-09-01 DIAGNOSIS — I10 Essential (primary) hypertension: Secondary | ICD-10-CM

## 2012-09-01 DIAGNOSIS — Z8719 Personal history of other diseases of the digestive system: Secondary | ICD-10-CM | POA: Insufficient documentation

## 2012-09-01 DIAGNOSIS — Z8742 Personal history of other diseases of the female genital tract: Secondary | ICD-10-CM | POA: Insufficient documentation

## 2012-09-01 DIAGNOSIS — E669 Obesity, unspecified: Secondary | ICD-10-CM | POA: Insufficient documentation

## 2012-09-01 DIAGNOSIS — Z87891 Personal history of nicotine dependence: Secondary | ICD-10-CM | POA: Insufficient documentation

## 2012-09-01 DIAGNOSIS — Z8619 Personal history of other infectious and parasitic diseases: Secondary | ICD-10-CM | POA: Insufficient documentation

## 2012-09-01 DIAGNOSIS — IMO0002 Reserved for concepts with insufficient information to code with codable children: Secondary | ICD-10-CM | POA: Insufficient documentation

## 2012-09-01 MED ORDER — LORAZEPAM 1 MG PO TABS
1.0000 mg | ORAL_TABLET | Freq: Once | ORAL | Status: AC
  Administered 2012-09-01: 1 mg via ORAL
  Filled 2012-09-01: qty 1

## 2012-09-01 MED ORDER — DILTIAZEM HCL 30 MG PO TABS
90.0000 mg | ORAL_TABLET | Freq: Once | ORAL | Status: DC
  Filled 2012-09-01: qty 3

## 2012-09-01 NOTE — ED Provider Notes (Signed)
History     CSN: [id number]  Arrival date & time 09/01/12  2004   First MD Initiated Contact with Patient 09/01/12 2012      No chief complaint on file.   (Consider location/radiation/quality/duration/timing/severity/associated sxs/prior treatment) Patient is a 52 y.o. female presenting with hypertension. The history is provided by the patient (the pt states he bp has been running high.  she spoke with her md and he stated to increase the cardiazem but she came to the hospital instead). No language interpreter was used.  Hypertension This is a recurrent problem. The current episode started 6 to 12 hours ago. The problem occurs constantly. The problem has not changed since onset.Pertinent negatives include no chest pain, no abdominal pain and no headaches. Nothing aggravates the symptoms. Nothing relieves the symptoms.    Past Medical History  Diagnosis Date  . Overweight     Obesity  . Hypertension   . Hyperlipidemia   . Palpitations   . Chest pain     denied on 06/18/2012  . Hypoglycemia   . Uterine fibroid     pt. states that she has a "closed cervix"   . Sinusitis, acute     seen by PCP- 06/17/2012, will treat /w augmentin & tussin/DM  . H/O hiatal hernia   . Headache     using tylenol prn  . Aneurysm     brain  . Normal cardiac stress test     pt. released fr. [name] care in 10/2011, all findings w/i normal limits  . H. pylori infection     2012- stomach biopsy  . GERD (gastroesophageal reflux disease)     h/o colitis   . Family history of anesthesia complication     brother & neice "flat lined" during induction: neice d/t a medication given for a blood clotting problem; brother d/t "miscalculated amount of anesthesia"  . Chronic kidney disease     L - upper pole- defect, doesn't effect       Past Surgical History  Procedure Laterality Date  . Colonoscopy    . Lipoma excision      buttock 10/2010  . Craniotomy Right 06/24/2012    Procedure: CRANIOTOMY  INTRACRANIAL ANEURYSM FOR CAROTID;  Surgeon: Carmela Hurt, MD;  Location: [facility];  Service: Neurosurgery;  Laterality: Right;  RIGHT Pterional Craniotomy for aneurysm    Family History  Problem Relation Age of Onset  . Hypertension Other   . Coronary artery disease Other     History  Substance Use Topics  . Smoking status: Former Smoker -- 0.80 packs/day for 15 years    Types: Cigarettes    Quit date: 04/28/2004  . Smokeless tobacco: Former Neurosurgeon    Quit date: 06/19/2003  . Alcohol Use: No    OB History   Grav Para Term Preterm Abortions TAB SAB Ect Mult Living                  Review of Systems  Constitutional: Negative for appetite change and fatigue.  HENT: Negative for congestion, sinus pressure and ear discharge.   Eyes: Negative for discharge.  Respiratory: Negative for cough.   Cardiovascular: Negative for chest pain.  Gastrointestinal: Negative for abdominal pain and diarrhea.  Genitourinary: Negative for frequency and hematuria.  Musculoskeletal: Negative for back pain.  Skin: Negative for rash.  Neurological: Negative for seizures and headaches.  Psychiatric/Behavioral: Positive for agitation. Negative for hallucinations.    Allergies  Zithromax; Ace inhibitors; Ciprofloxacin; Other;  Medrol; and Tetracyclines & related  Home Medications   Current Outpatient Rx  Name  Route  Sig  Dispense  Refill  . acetaminophen (TYLENOL) 500 MG tablet   Oral   Take 1,000 mg by mouth daily as needed for pain.         . Cholecalciferol (D 1000) 1000 UNITS tablet   Oral   Take 1,000 Units by mouth every evening.          . diltiazem (CARDIZEM CD) 180 MG 24 hr capsule   Oral   Take 180 mg by mouth at bedtime.          . hydrALAZINE (APRESOLINE) 50 MG tablet   Oral   Take 50 mg by mouth 2 (two) times daily.         [name] HYDROcodone-acetaminophen (NORCO/VICODIN) 5-325 MG per tablet   Oral   Take 2 tablets by mouth every 4 (four) hours as needed for  pain.   10 tablet   0   . loratadine (CLARITIN) 10 MG tablet   Oral   Take 10 mg by mouth daily.          [name] lovastatin (MEVACOR) 20 MG tablet   Oral   Take 20 mg by mouth at bedtime.         . metoprolol (LOPRESSOR) 50 MG tablet   Oral   Take 50 mg by mouth 2 (two) times daily.         [name] omeprazole (PRILOSEC) 20 MG capsule   Oral   Take 20 mg by mouth daily.            BP 151/99  Pulse 110  Temp(Src) 99.1 F (37.3 C) (Oral)  Resp 18  SpO2 100%  LMP 08/25/2012  Physical Exam  Constitutional: She is oriented to person, place, and time. She appears well-developed.  HENT:  Head: Normocephalic.  Eyes: Conjunctivae and EOM are normal. No scleral icterus.  Neck: Neck supple. No thyromegaly present.  Cardiovascular: Normal rate and regular rhythm.  Exam reveals no gallop and no friction rub.   No murmur heard. Pulmonary/Chest: No stridor. She has no wheezes. She has no rales. She exhibits no tenderness.  Abdominal: She exhibits no distension. There is no tenderness. There is no rebound.  Musculoskeletal: Normal range of motion. She exhibits no edema.  Lymphadenopathy:    She has no cervical adenopathy.  Neurological: She is oriented to person, place, and time. Coordination normal.  Skin: No rash noted. No erythema.  Psychiatric: She has a normal mood and affect.  Moderately anxious    ED Course  Procedures (including critical care time)  Labs Reviewed - No data to display No results found.   1. Hypertension       MDM  Pt told her bp was fine here and she wanted to go home and follow up with her md.  She will increase the cardiazem as instructed by her md        Benny Lennert, MD 09/01/12 2057

## 2012-09-01 NOTE — ED Notes (Signed)
Seen here recently for hyperttension  And cont to hypertension

## 2012-09-01 NOTE — ED Notes (Signed)
Patient states "I just want to know what's wrong with me. Maybe I should just leave here and go to [facility]."

## 2012-09-05 ENCOUNTER — Encounter (HOSPITAL_COMMUNITY): Payer: Self-pay | Admitting: *Deleted

## 2012-09-05 ENCOUNTER — Emergency Department (HOSPITAL_COMMUNITY): Payer: BC Managed Care – PPO

## 2012-09-05 ENCOUNTER — Emergency Department (HOSPITAL_COMMUNITY)
Admission: EM | Admit: 2012-09-05 | Discharge: 2012-09-05 | Disposition: A | Discharge status: Home / Self Care | Payer: BC Managed Care – PPO | Attending: Emergency Medicine | Admitting: Emergency Medicine

## 2012-09-05 DIAGNOSIS — E663 Overweight: Secondary | ICD-10-CM | POA: Insufficient documentation

## 2012-09-05 DIAGNOSIS — K219 Gastro-esophageal reflux disease without esophagitis: Secondary | ICD-10-CM | POA: Insufficient documentation

## 2012-09-05 DIAGNOSIS — I129 Hypertensive chronic kidney disease with stage 1 through stage 4 chronic kidney disease, or unspecified chronic kidney disease: Secondary | ICD-10-CM | POA: Insufficient documentation

## 2012-09-05 DIAGNOSIS — Z79899 Other long term (current) drug therapy: Secondary | ICD-10-CM | POA: Insufficient documentation

## 2012-09-05 DIAGNOSIS — Z8719 Personal history of other diseases of the digestive system: Secondary | ICD-10-CM | POA: Insufficient documentation

## 2012-09-05 DIAGNOSIS — Z8742 Personal history of other diseases of the female genital tract: Secondary | ICD-10-CM | POA: Insufficient documentation

## 2012-09-05 DIAGNOSIS — Z8679 Personal history of other diseases of the circulatory system: Secondary | ICD-10-CM | POA: Insufficient documentation

## 2012-09-05 DIAGNOSIS — N189 Chronic kidney disease, unspecified: Secondary | ICD-10-CM | POA: Insufficient documentation

## 2012-09-05 DIAGNOSIS — R630 Anorexia: Secondary | ICD-10-CM | POA: Insufficient documentation

## 2012-09-05 DIAGNOSIS — R51 Headache: Secondary | ICD-10-CM

## 2012-09-05 DIAGNOSIS — Z8639 Personal history of other endocrine, nutritional and metabolic disease: Secondary | ICD-10-CM | POA: Insufficient documentation

## 2012-09-05 DIAGNOSIS — Z8709 Personal history of other diseases of the respiratory system: Secondary | ICD-10-CM | POA: Insufficient documentation

## 2012-09-05 DIAGNOSIS — Z862 Personal history of diseases of the blood and blood-forming organs and certain disorders involving the immune mechanism: Secondary | ICD-10-CM | POA: Insufficient documentation

## 2012-09-05 DIAGNOSIS — E785 Hyperlipidemia, unspecified: Secondary | ICD-10-CM | POA: Insufficient documentation

## 2012-09-05 MED ORDER — LORAZEPAM 1 MG PO TABS: ORAL_TABLET | ORAL | Status: DC

## 2012-09-05 MED ORDER — LORAZEPAM 1 MG PO TABS
1.0000 mg | ORAL_TABLET | Freq: Once | ORAL | Status: AC
  Administered 2012-09-05: 1 mg via ORAL
  Filled 2012-09-05: qty 1

## 2012-09-05 NOTE — ED Provider Notes (Signed)
History    This chart was scribed for [name] Lennert, MD, by Frederik Pear, ED scribe. The patient was seen in room APA09/APA09 and the patient's care was started at 1505.    CSN: [id number]  Arrival date & time 09/05/12  1316   First MD Initiated Contact with Patient 09/05/12 1505      Chief Complaint  Patient presents with  . Headache    (Consider location/radiation/quality/duration/timing/severity/associated sxs/prior treatment) Patient is a 52 y.o. female presenting with headaches. The history is provided by the patient and medical records. No language interpreter was used.  Headache Pain location:  L parietal and R parietal Quality:  Unable to specify Radiates to:  Does not radiate Onset quality:  Sudden Progression:  Waxing and waning Chronicity:  Chronic Similar to prior headaches: yes   Associated symptoms: no abdominal pain, no back pain, no congestion, no cough, no diarrhea, no fatigue, no seizures and no sinus pressure     HPI Comments: [name] [name] is a 52 y.o. female with a h/o of an aneurysm repair on 06/24/12 and a h/o of numerous ED visits for the same symptoms who presents to the Emergency Department complaining of sudden onset, pressure HA at the top of her head that has been coming and going and began earlier today. She also complains of elevated HR and BP, which spiked at home at 129 and 160/98 respectively. She also complains of a decreased appetite that began over the past week. She treated the HA at home with Tylenol with no relief. She denies having a fever or neck stiffness. She states that she has a follow up visit with Dr. Franky Macho in 3 days. She states that she has recently been seen by [facility] who reports that she has suffered some kidney damage from taking ciprofloxacin and iohexol. She denies previous relief from hydrocodone, which she has discontinued taking.   Past Medical History  Diagnosis Date  . Overweight     Obesity  . Hypertension    . Hyperlipidemia   . Palpitations   . Chest pain     denied on 06/18/2012  . Hypoglycemia   . Uterine fibroid     pt. states that she has a "closed cervix"   . Sinusitis, acute     seen by PCP- 06/17/2012, will treat /w augmentin & tussin/DM  . H/O hiatal hernia   . Headache     using tylenol prn  . Aneurysm     brain  . Normal cardiac stress test     pt. released fr. [name] care in 10/2011, all findings w/i normal limits  . H. pylori infection     2012- stomach biopsy  . GERD (gastroesophageal reflux disease)     h/o colitis   . Family history of anesthesia complication     brother & neice "flat lined" during induction: neice d/t a medication given for a blood clotting problem; brother d/t "miscalculated amount of anesthesia"  . Chronic kidney disease     L - upper pole- defect, doesn't effect       Past Surgical History  Procedure Laterality Date  . Colonoscopy    . Lipoma excision      buttock 10/2010  . Craniotomy Right 06/24/2012    Procedure: CRANIOTOMY INTRACRANIAL ANEURYSM FOR CAROTID;  Surgeon: Carmela Hurt, MD;  Location: [facility];  Service: Neurosurgery;  Laterality: Right;  RIGHT Pterional Craniotomy for aneurysm    Family History  Problem Relation Age  of Onset  . Hypertension Other   . Coronary artery disease Other     History  Substance Use Topics  . Smoking status: Former Smoker -- 0.80 packs/day for 15 years    Types: Cigarettes    Quit date: 04/28/2004  . Smokeless tobacco: Former Neurosurgeon    Quit date: 06/19/2003  . Alcohol Use: No    OB History   Grav Para Term Preterm Abortions TAB SAB Ect Mult Living                  Review of Systems  Constitutional: Negative for appetite change and fatigue.  HENT: Negative for congestion, sinus pressure and ear discharge.   Eyes: Negative for discharge.  Respiratory: Negative for cough.   Cardiovascular: Negative for chest pain.  Gastrointestinal: Negative for abdominal pain and diarrhea.   Genitourinary: Negative for frequency and hematuria.  Musculoskeletal: Negative for back pain.  Skin: Negative for rash.  Neurological: Positive for headaches (pressure). Negative for seizures.  Psychiatric/Behavioral: Negative for hallucinations.    Allergies  Zithromax; Ace inhibitors; Ciprofloxacin; Other; Medrol; and Tetracyclines & related  Home Medications   Current Outpatient Rx  Name  Route  Sig  Dispense  Refill  . acetaminophen (TYLENOL) 500 MG tablet   Oral   Take 1,000 mg by mouth daily as needed for pain.         . Cholecalciferol (D 1000) 1000 UNITS tablet   Oral   Take 1,000 Units by mouth every evening.          . diltiazem (CARDIZEM CD) 180 MG 24 hr capsule   Oral   Take 180 mg by mouth at bedtime.          . hydrALAZINE (APRESOLINE) 50 MG tablet   Oral   Take 50 mg by mouth 2 (two) times daily.         [name] HYDROcodone-acetaminophen (NORCO/VICODIN) 5-325 MG per tablet   Oral   Take 2 tablets by mouth every 4 (four) hours as needed for pain.   10 tablet   0   . loratadine (CLARITIN) 10 MG tablet   Oral   Take 10 mg by mouth daily.          [name] lovastatin (MEVACOR) 20 MG tablet   Oral   Take 20 mg by mouth at bedtime.         . metoprolol (LOPRESSOR) 50 MG tablet   Oral   Take 50 mg by mouth 2 (two) times daily.         [name] omeprazole (PRILOSEC) 20 MG capsule   Oral   Take 20 mg by mouth daily.            BP 105/55  Pulse 80  Temp(Src) 98.3 F (36.8 C) (Oral)  Resp 16  Ht 5\' 3"  (1.6 m)  Wt 184 lb (83.462 kg)  BMI 32.6 kg/m2  SpO2 100%  LMP 08/25/2012  Physical Exam  Nursing note and vitals reviewed. Constitutional: She is oriented to person, place, and time. She appears well-developed.  HENT:  Head: Normocephalic.  Eyes: Conjunctivae and EOM are normal. No scleral icterus.  Neck: Neck supple. No thyromegaly present.  Cardiovascular: Normal rate and regular rhythm.  Exam reveals no gallop and no friction rub.   No  murmur heard. Pulmonary/Chest: No stridor. She has no wheezes. She has no rales. She exhibits no tenderness.  Abdominal: She exhibits no distension. There is no tenderness. There is no rebound.  Musculoskeletal:  Normal range of motion. She exhibits no edema.  Lymphadenopathy:    She has no cervical adenopathy.  Neurological: She is oriented to person, place, and time. Coordination normal.  Skin: No rash noted. No erythema.  Psychiatric: She has a normal mood and affect. Her behavior is normal.    ED Course  Procedures (including critical care time)  DIAGNOSTIC STUDIES: Oxygen Saturation is 100% on room air, normal by my interpretation.    COORDINATION OF CARE:  15:25- Discussed planned course of treatment with the patient, including a Head CT without contrast and Ativan, who is agreeable at this time.  15:30- Medication Orders- Lorazepam (ativan) tablet 1 mg- once.  Labs Reviewed - No data to display No results found.   No diagnosis found.    MDM  Pt improved with ativan  The chart was scribed for me under my direct supervision.  I personally performed the history, physical, and medical decision making and all procedures in the evaluation of this patient.[name] Lennert, MD 09/05/12 [phone]

## 2012-09-05 NOTE — ED Notes (Signed)
Pt here for ongoing headache with pain described as a pressure pain. Hx of aneurysm repair 06/24/12. Pt states multiple visits for the same pain since the surgery, per pt.

## 2013-03-04 ENCOUNTER — Other Ambulatory Visit: Payer: Self-pay

## 2014-02-11 ENCOUNTER — Other Ambulatory Visit: Payer: Self-pay

## 2014-04-02 ENCOUNTER — Inpatient Hospital Stay (HOSPITAL_COMMUNITY)
Admission: AD | Admit: 2014-04-02 | Discharge: 2014-04-03 | DRG: 313 | Disposition: A | Discharge status: Home / Self Care | Payer: BC Managed Care – PPO | Source: Other Acute Inpatient Hospital | Attending: Internal Medicine | Admitting: Internal Medicine

## 2014-04-02 DIAGNOSIS — N189 Chronic kidney disease, unspecified: Secondary | ICD-10-CM | POA: Diagnosis present

## 2014-04-02 DIAGNOSIS — K219 Gastro-esophageal reflux disease without esophagitis: Secondary | ICD-10-CM | POA: Diagnosis present

## 2014-04-02 DIAGNOSIS — R42 Dizziness and giddiness: Secondary | ICD-10-CM

## 2014-04-02 DIAGNOSIS — E669 Obesity, unspecified: Secondary | ICD-10-CM | POA: Diagnosis present

## 2014-04-02 DIAGNOSIS — Z6836 Body mass index (BMI) 36.0-36.9, adult: Secondary | ICD-10-CM

## 2014-04-02 DIAGNOSIS — E785 Hyperlipidemia, unspecified: Secondary | ICD-10-CM | POA: Diagnosis present

## 2014-04-02 DIAGNOSIS — R519 Headache, unspecified: Secondary | ICD-10-CM | POA: Diagnosis present

## 2014-04-02 DIAGNOSIS — Z8249 Family history of ischemic heart disease and other diseases of the circulatory system: Secondary | ICD-10-CM

## 2014-04-02 DIAGNOSIS — R51 Headache: Secondary | ICD-10-CM

## 2014-04-02 DIAGNOSIS — R079 Chest pain, unspecified: Secondary | ICD-10-CM | POA: Diagnosis present

## 2014-04-02 DIAGNOSIS — I671 Cerebral aneurysm, nonruptured: Secondary | ICD-10-CM | POA: Diagnosis present

## 2014-04-02 DIAGNOSIS — R002 Palpitations: Secondary | ICD-10-CM | POA: Diagnosis present

## 2014-04-02 DIAGNOSIS — R0789 Other chest pain: Principal | ICD-10-CM | POA: Diagnosis present

## 2014-04-02 DIAGNOSIS — I10 Essential (primary) hypertension: Secondary | ICD-10-CM | POA: Diagnosis present

## 2014-04-02 DIAGNOSIS — Z888 Allergy status to other drugs, medicaments and biological substances status: Secondary | ICD-10-CM | POA: Diagnosis not present

## 2014-04-02 DIAGNOSIS — I129 Hypertensive chronic kidney disease with stage 1 through stage 4 chronic kidney disease, or unspecified chronic kidney disease: Secondary | ICD-10-CM | POA: Diagnosis present

## 2014-04-03 ENCOUNTER — Inpatient Hospital Stay (HOSPITAL_COMMUNITY): Payer: BC Managed Care – PPO

## 2014-04-03 ENCOUNTER — Encounter (HOSPITAL_COMMUNITY): Payer: Self-pay | Admitting: *Deleted

## 2014-04-03 DIAGNOSIS — I671 Cerebral aneurysm, nonruptured: Secondary | ICD-10-CM

## 2014-04-03 DIAGNOSIS — R51 Headache: Secondary | ICD-10-CM

## 2014-04-03 DIAGNOSIS — R079 Chest pain, unspecified: Secondary | ICD-10-CM

## 2014-04-03 DIAGNOSIS — I10 Essential (primary) hypertension: Secondary | ICD-10-CM

## 2014-04-03 DIAGNOSIS — R0789 Other chest pain: Secondary | ICD-10-CM | POA: Diagnosis present

## 2014-04-03 DIAGNOSIS — R42 Dizziness and giddiness: Secondary | ICD-10-CM

## 2014-04-03 DIAGNOSIS — R519 Headache, unspecified: Secondary | ICD-10-CM | POA: Diagnosis present

## 2014-04-03 DIAGNOSIS — K210 Gastro-esophageal reflux disease with esophagitis: Secondary | ICD-10-CM

## 2014-04-03 DIAGNOSIS — E785 Hyperlipidemia, unspecified: Secondary | ICD-10-CM

## 2014-04-03 DIAGNOSIS — R002 Palpitations: Secondary | ICD-10-CM

## 2014-04-03 LAB — PROTIME-INR
INR: 1.02 (ref 0.00–1.49)
Prothrombin Time: 13.5 seconds (ref 11.6–15.2)

## 2014-04-03 LAB — CBC WITH DIFFERENTIAL/PLATELET
BASOS PCT: 0 % (ref 0–1)
Basophils Absolute: 0 10*3/uL (ref 0.0–0.1)
EOS ABS: 0.1 10*3/uL (ref 0.0–0.7)
Eosinophils Relative: 2 % (ref 0–5)
HCT: 35.6 % — ABNORMAL LOW (ref 36.0–46.0)
HEMOGLOBIN: 11.3 g/dL — AB (ref 12.0–15.0)
Lymphocytes Relative: 28 % (ref 12–46)
Lymphs Abs: 2.1 10*3/uL (ref 0.7–4.0)
MCH: 27.8 pg (ref 26.0–34.0)
MCHC: 31.7 g/dL (ref 30.0–36.0)
MCV: 87.5 fL (ref 78.0–100.0)
MONOS PCT: 6 % (ref 3–12)
Monocytes Absolute: 0.4 10*3/uL (ref 0.1–1.0)
NEUTROS ABS: 4.8 10*3/uL (ref 1.7–7.7)
NEUTROS PCT: 64 % (ref 43–77)
PLATELETS: 361 10*3/uL (ref 150–400)
RBC: 4.07 MIL/uL (ref 3.87–5.11)
RDW: 13.7 % (ref 11.5–15.5)
WBC: 7.5 10*3/uL (ref 4.0–10.5)

## 2014-04-03 LAB — GLUCOSE, CAPILLARY: Glucose-Capillary: 80 mg/dL (ref 70–99)

## 2014-04-03 LAB — RAPID URINE DRUG SCREEN, HOSP PERFORMED
AMPHETAMINES: NOT DETECTED
BARBITURATES: NOT DETECTED
BENZODIAZEPINES: NOT DETECTED
COCAINE: NOT DETECTED
Opiates: NOT DETECTED
Tetrahydrocannabinol: NOT DETECTED

## 2014-04-03 LAB — COMPREHENSIVE METABOLIC PANEL
ALBUMIN: 3.5 g/dL (ref 3.5–5.2)
ALK PHOS: 64 U/L (ref 39–117)
ALT: 8 U/L (ref 0–35)
AST: 14 U/L (ref 0–37)
Anion gap: 12 (ref 5–15)
BILIRUBIN TOTAL: 0.5 mg/dL (ref 0.3–1.2)
BUN: 15 mg/dL (ref 6–23)
CHLORIDE: 105 meq/L (ref 96–112)
CO2: 25 mEq/L (ref 19–32)
Calcium: 9.8 mg/dL (ref 8.4–10.5)
Creatinine, Ser: 1.01 mg/dL (ref 0.50–1.10)
GFR calc Af Amer: 72 mL/min — ABNORMAL LOW (ref >90)
GFR calc non Af Amer: 62 mL/min — ABNORMAL LOW (ref >90)
Glucose, Bld: 85 mg/dL (ref 70–99)
POTASSIUM: 3.9 meq/L (ref 3.7–5.3)
SODIUM: 142 meq/L (ref 137–147)
TOTAL PROTEIN: 6.9 g/dL (ref 6.0–8.3)

## 2014-04-03 LAB — LIPID PANEL
CHOL/HDL RATIO: 3.3 ratio
Cholesterol: 179 mg/dL (ref 0–200)
HDL: 55 mg/dL (ref >39)
LDL CALC: 114 mg/dL — AB (ref 0–99)
Triglycerides: 51 mg/dL (ref <150)
VLDL: 10 mg/dL (ref 0–40)

## 2014-04-03 LAB — HEMOGLOBIN A1C
HEMOGLOBIN A1C: 6.1 % — AB (ref <5.7)
Mean Plasma Glucose: 128 mg/dL — ABNORMAL HIGH (ref <117)

## 2014-04-03 LAB — TSH: TSH: 1.41 u[IU]/mL (ref 0.350–4.500)

## 2014-04-03 LAB — TROPONIN I
Troponin I: <0.3 ng/mL (ref <0.30)
Troponin I: <0.3 ng/mL (ref <0.30)
Troponin I: <0.3 ng/mL (ref <0.30)

## 2014-04-03 MED ORDER — HEPARIN SODIUM (PORCINE) 5000 UNIT/ML IJ SOLN
5000.0000 [IU] | Freq: Three times a day (TID) | INTRAMUSCULAR | Status: DC
  Administered 2014-04-03 (×2): 5000 [IU] via SUBCUTANEOUS
  Filled 2014-04-03 (×4): qty 1

## 2014-04-03 MED ORDER — REGADENOSON 0.4 MG/5ML IV SOLN
INTRAVENOUS | Status: AC
  Administered 2014-04-03: 0.4 mg via INTRAVENOUS
  Filled 2014-04-03: qty 5

## 2014-04-03 MED ORDER — ASPIRIN EC 81 MG PO TBEC
81.0000 mg | DELAYED_RELEASE_TABLET | Freq: Every day | ORAL | Status: DC
  Administered 2014-04-03: 81 mg via ORAL
  Filled 2014-04-03: qty 1

## 2014-04-03 MED ORDER — METOPROLOL TARTRATE 50 MG PO TABS
50.0000 mg | ORAL_TABLET | Freq: Two times a day (BID) | ORAL | Status: DC
  Administered 2014-04-03 (×2): 50 mg via ORAL
  Filled 2014-04-03 (×3): qty 1

## 2014-04-03 MED ORDER — REGADENOSON 0.4 MG/5ML IV SOLN
0.4000 mg | Freq: Once | INTRAVENOUS | Status: AC
  Administered 2014-04-03: 0.4 mg via INTRAVENOUS
  Filled 2014-04-03: qty 5

## 2014-04-03 MED ORDER — ASPIRIN 81 MG PO TABS: 81.0000 mg | ORAL_TABLET | Freq: Every day | ORAL | Status: DC

## 2014-04-03 MED ORDER — SODIUM CHLORIDE 0.9 % IJ SOLN
3.0000 mL | Freq: Two times a day (BID) | INTRAMUSCULAR | Status: DC
  Administered 2014-04-03 (×2): 3 mL via INTRAVENOUS

## 2014-04-03 MED ORDER — POLYSACCHARIDE IRON COMPLEX 150 MG PO CAPS
150.0000 mg | ORAL_CAPSULE | Freq: Every day | ORAL | Status: DC
  Administered 2014-04-03: 150 mg via ORAL
  Filled 2014-04-03: qty 1

## 2014-04-03 MED ORDER — GUAIFENESIN ER 600 MG PO TB12
600.0000 mg | ORAL_TABLET | Freq: Two times a day (BID) | ORAL | Status: DC
  Administered 2014-04-03 (×2): 600 mg via ORAL
  Filled 2014-04-03 (×3): qty 1

## 2014-04-03 MED ORDER — LORATADINE 10 MG PO TABS
10.0000 mg | ORAL_TABLET | Freq: Every day | ORAL | Status: DC
Start: 2014-04-03 — End: 2014-04-03
  Administered 2014-04-03: 10 mg via ORAL
  Filled 2014-04-03: qty 1

## 2014-04-03 MED ORDER — PANTOPRAZOLE SODIUM 40 MG PO TBEC
40.0000 mg | DELAYED_RELEASE_TABLET | Freq: Every day | ORAL | Status: DC
  Administered 2014-04-03: 40 mg via ORAL
  Filled 2014-04-03: qty 1

## 2014-04-03 MED ORDER — VITAMIN D 1000 UNITS PO TABS
1000.0000 [IU] | ORAL_TABLET | Freq: Every evening | ORAL | Status: DC
  Filled 2014-04-03: qty 1

## 2014-04-03 MED ORDER — TECHNETIUM TC 99M SESTAMIBI GENERIC - CARDIOLITE
30.0000 | Freq: Once | INTRAVENOUS | Status: AC | PRN
  Administered 2014-04-03: 30 via INTRAVENOUS

## 2014-04-03 MED ORDER — HYDRALAZINE HCL 50 MG PO TABS
50.0000 mg | ORAL_TABLET | Freq: Two times a day (BID) | ORAL | Status: DC
  Administered 2014-04-03 (×2): 50 mg via ORAL
  Filled 2014-04-03 (×3): qty 1

## 2014-04-03 MED ORDER — DILTIAZEM HCL ER COATED BEADS 180 MG PO CP24
180.0000 mg | ORAL_CAPSULE | Freq: Every day | ORAL | Status: DC
  Administered 2014-04-03: 180 mg via ORAL
  Filled 2014-04-03: qty 1

## 2014-04-03 MED ORDER — TECHNETIUM TC 99M SESTAMIBI GENERIC - CARDIOLITE
10.0000 | Freq: Once | INTRAVENOUS | Status: AC | PRN
  Administered 2014-04-03: 10 via INTRAVENOUS

## 2014-04-03 MED ORDER — CITALOPRAM HYDROBROMIDE 20 MG PO TABS
20.0000 mg | ORAL_TABLET | Freq: Every day | ORAL | Status: DC
  Filled 2014-04-03: qty 1

## 2014-04-03 MED ORDER — ACETAMINOPHEN 500 MG PO TABS: 1000.0000 mg | ORAL_TABLET | Freq: Every day | ORAL | Status: DC | PRN

## 2014-04-03 MED ORDER — SIMVASTATIN 5 MG PO TABS
5.0000 mg | ORAL_TABLET | Freq: Every day | ORAL | Status: DC
  Filled 2014-04-03: qty 1

## 2014-04-03 MED ORDER — NITROGLYCERIN 0.4 MG SL SUBL: 0.4000 mg | SUBLINGUAL_TABLET | SUBLINGUAL | Status: DC | PRN

## 2014-04-03 NOTE — H&P (Signed)
[facility] History and Physical  Alda [name] DXI:[id number] DOB: Aug 18, 1960 DOA: 04/02/2014  Referring physician: ED physician PCP: Gar Ponto, MD  Specialists:   Chief Complaint: Dizziness, headache, chest tightness.  HPI: [name] [name] is a 53 y.o. female with past medical history hypertension, hyperlipidemia, GERD, chronic kidney disease, cerebral aneurysm (S/P of clip), who present with Dizziness, headache, chest tightness.  Patient reports that in the early morning at about 6 am,  she woke up with dizziness and headache. She also had palpitation with heart rate of 138/min, her blood pressure elevated at 170/110. She did not have fall. She has constant chest tightness. She took 50 mg of metoprolol without significant help. She called EMS and was brought to [facility]. In ED, she was found to have negative chest x-ray and slightly elevated troponin at 0.33 and 0.35 on two tests, 2 hours apart. Patient was treated with aspirin and transferred to [facility] hospital because they don't have cardiologist in the weekend.  Of note, patient reports that she had left arm and leg tingling sensations, which lasted for half day in last month with negative MRI work up. Today she has mild left jaw pain, but no weakness, numbness or tingling sensations in her extremities.  She has mild cough in the past week which she attribute to the chronic sinus issue. She denies fever, chills, SOB, abdominal pain, diarrhea, constipation, dysuria, urgency, frequency, hematuria, skin rashes, joint pain or leg swelling.  Work up in the ED demonstrates Negative chest x-ray and elevated troponin slightly. Patient is transferred from [facility] hospital to [place] for further evaluation and treatment. Cardiology was consulted.  Review of Systems: As presented in the history of presenting illness, rest negative.  Where does patient live? At home  Can patient participate in ADLs? Yes  Allergy:  Allergies   Allergen Reactions  . Zithromax [Azithromycin] Anaphylaxis  . Ace Inhibitors Swelling  . Ciprofloxacin Nausea And Vomiting    Also: kidney failure   . Other Swelling, Other (See Comments) and Cough    Sneezing allergy to cats  . Medrol [Methylprednisolone] Palpitations    Heart racing, increased BP  . Tetracyclines & Related Palpitations    Heart racing, increased bp    Past Medical History  Diagnosis Date  . Overweight     Obesity  . Hypertension   . Hyperlipidemia   . Palpitations   . Chest pain     denied on 06/18/2012  . Hypoglycemia   . Uterine fibroid     pt. states that she has a "closed cervix"   . Sinusitis, acute     seen by PCP- 06/17/2012, will treat /w augmentin & tussin/DM  . H/O hiatal hernia   . Headache(784.0)     using tylenol prn  . Aneurysm     brain  . Normal cardiac stress test     pt. released fr. Dr. [name] care in 10/2011, all findings w/i normal limits  . H. pylori infection     2012- stomach biopsy  . GERD (gastroesophageal reflux disease)     h/o colitis   . Family history of anesthesia complication     brother & neice "flat lined" during induction: neice d/t a medication given for a blood clotting problem; brother d/t "miscalculated amount of anesthesia"  . Chronic kidney disease     L - upper pole- defect, doesn't effect       Past Surgical History  Procedure Laterality Date  . Colonoscopy    .  Lipoma excision      buttock 10/2010  . Craniotomy Right 06/24/2012    Procedure: CRANIOTOMY INTRACRANIAL ANEURYSM FOR CAROTID;  Surgeon: [name], MD;  Location: [place] [facility];  Service: Neurosurgery;  Laterality: Right;  RIGHT Pterional Craniotomy for aneurysm    Social History:  reports that she quit smoking about 9 years ago. Her smoking use included Cigarettes. She has a 12 pack-year smoking history. She quit smokeless tobacco use about 10 years ago. She reports that she does not drink alcohol or use illicit drugs.  Family History:   Family History  Problem Relation Age of Onset  . Hypertension Other   . Coronary artery disease Other      Prior to Admission medications   Medication Sig Start Date End Date Taking? Authorizing Provider  acetaminophen (TYLENOL) 500 MG tablet Take 1,000 mg by mouth daily as needed for pain.    Historical Provider, MD  Cholecalciferol (D 1000) 1000 UNITS tablet Take 1,000 Units by mouth every evening.     Historical Provider, MD  diltiazem (CARDIZEM CD) 180 MG 24 hr capsule Take 180 mg by mouth daily.     Historical Provider, MD  hydrALAZINE (APRESOLINE) 50 MG tablet Take 50 mg by mouth 2 (two) times daily. 08/29/12   Historical Provider, MD  loratadine (CLARITIN) 10 MG tablet Take 10 mg by mouth daily.     Historical Provider, MD  LORazepam (ATIVAN) 1 MG tablet Take one  Every 8 hours for pressure in head 09/05/12   Maudry Diego, MD  lovastatin (MEVACOR) 20 MG tablet Take 20 mg by mouth at bedtime.    Historical Provider, MD  metoprolol (LOPRESSOR) 50 MG tablet Take 50 mg by mouth 2 (two) times daily.    Historical Provider, MD  omeprazole (PRILOSEC) 20 MG capsule Take 20 mg by mouth daily.     Historical Provider, MD    Physical Exam: Filed Vitals:   04/02/14 2255  BP: 125/66  Pulse: 56  Temp: 98.4 F (36.9 C)  TempSrc: Oral  Resp: 18  Height: 5\' 2"  (1.575 m)  Weight: 91.7 kg (202 lb 2.6 oz)  SpO2: 99%   General: Not in acute distress HEENT:       Eyes: PERRL, EOMI, no scleral icterus       ENT: No discharge from the ears and nose, no pharynx injection, no tonsillar enlargement.        Neck: No JVD, no bruit, no mass felt. Cardiac: S1/S2, RRR, No murmurs, No gallops or rubs Pulm: Good air movement bilaterally. Clear to auscultation bilaterally. No rales, wheezing, rhonchi or rubs. Abd: Soft, nondistended, nontender, no rebound pain, no organomegaly, BS present Ext: No edema bilaterally. 2+DP/PT pulse bilaterally Musculoskeletal: No joint deformities, erythema, or  stiffness, ROM full Skin: No rashes.  Neuro: Alert and oriented X3, cranial nerves II-XII grossly intact, muscle strength 5/5 in all extremeties, sensation to light touch intact. Brachial reflex 2+ bilaterally. Knee reflex 1+ bilaterally. Negative Babinski's sign. Normal finger to nose test. Psych: Patient is not psychotic, no suicidal or hemocidal ideation.  Labs on Admission:  Basic Metabolic Panel: No results for input(s): NA, K, CL, CO2, GLUCOSE, BUN, CREATININE, CALCIUM, MG, PHOS in the last 168 hours. Liver Function Tests: No results for input(s): AST, ALT, ALKPHOS, BILITOT, PROT, ALBUMIN in the last 168 hours. No results for input(s): LIPASE, AMYLASE in the last 168 hours. No results for input(s): AMMONIA in the last 168 hours. CBC: No results for input(s):  WBC, NEUTROABS, HGB, HCT, MCV, PLT in the last 168 hours. Cardiac Enzymes: No results for input(s): CKTOTAL, CKMB, CKMBINDEX, TROPONINI in the last 168 hours.  BNP (last 3 results) No results for input(s): PROBNP in the last 8760 hours. CBG: No results for input(s): GLUCAP in the last 168 hours.  Radiological Exams on Admission: No results found.  EKG: Independently reviewed.   Assessment/Plan Principal Problem:   Chest tightness or pressure Active Problems:   HLD (hyperlipidemia)   Essential hypertension   Palpitations   Aneurysm, cerebral, nonruptured   GERD (gastroesophageal reflux disease)   Dizziness   Headache  Chest tightness and pressure: She has slightly elevated troponin and risk factors including hypertension and hyperlipidemia, it is important to rule out ACS. Cardiology was consulted, Dr. Murvin Natal evaluated the patient, thought troponin elevation is not consistent with ACS and suggested to hold off anti-coagulation at this time and to get 2d echo. Chest x-ray is negative for acute abnormalities in [facility].  - will admit to tele bed.  - cycle CE q6 x3 and repeat her EKG in the am  -  Nitroglycerin, and aspirin, Statin  - Risk factor stratification: will check TSH, FLP and A1C  - per Dr. Murvin Natal, will be on NPO p MN for possible MPI in am. If pt has no more angina and echo unremarkable, will be reasonable to discharge with outpatient followup with cardiology.   Dizziness and headache: given hx of s/p of clipped cerebral aneurysm, and the recent alarming symptoms of left sided tingling sensations, it is reasonable to get image of brain. Patient is not a good candidate for MRI because of cerebral aneurysm treatment -will get CT-head without contrast - tylenol prn  HTN: -continue home Cardizem and metoprolol -Patient is slightly bradycardia currently, will put holding orders for heart rate less than 55/min  GERD: protoni   DVT ppx: SQ Heparin     Code Status: Full code Family Communication: None at bed side. Disposition Plan: Admit to inpatient   Date of Service 04/03/2014    Ivor Costa [facility] Pager [phone]  If 7PM-7AM, please contact night-coverage [email or web address] Password [email or web address] 04/03/2014, 1:29 AM

## 2014-04-03 NOTE — Discharge Instructions (Signed)

## 2014-04-03 NOTE — Plan of Care (Signed)
Problem: Phase I Progression Outcomes Goal: MD aware of Cardiac Marker results Outcome: Completed/Met Date Met:  04/03/14

## 2014-04-03 NOTE — Progress Notes (Signed)
DC IV and tele per MD orders and protocol; DC instructions reviewed with patient; per verbal order from MD patient is to continue to take cardizem 180mg  BID even though DC instructions state Daily; patient understands these instructions and will follow up with her PCP and Cardiology.  Rowe Pavy, RN

## 2014-04-03 NOTE — Progress Notes (Signed)
Patient has complaints of right jaw pain following heparin subq injection this am; notified MD; no new orders; will continue to monitor and assess.  Rowe Pavy, RN

## 2014-04-03 NOTE — Discharge Summary (Signed)
Physician Discharge Summary  [name] [name] NLZ:[id number] DOB: 1960/09/30 DOA: 04/02/2014  PCP: Gar Ponto, MD  Admit date: 04/02/2014 Discharge date: 04/03/2014  Time spent: <30 minutes  Recommendations for Outpatient Follow-up:  1. Discharge home with outpatient PCP follow-up in 1 week 2. Cardiology office will call for appointment  Discharge Diagnoses:  Principal problem Chest pain  Active Problems:   HLD (hyperlipidemia)   Essential hypertension   Palpitations   Aneurysm, cerebral, nonruptured   GERD (gastroesophageal reflux disease)   Dizziness   Headache   Discharge Condition: Fair   Diet recommendation: Heart healthy  Filed Weights   04/02/14 2255  Weight: 91.7 kg (202 lb 2.6 oz)    History of present illness:  Please refer to admission H&P for details, but in brief, 53 y.o. female with past medical history hypertension, hyperlipidemia, GERD, chronic kidney disease, cerebral aneurysm (S/P of clip), who present with Dizziness, headache, chest tightness. Early morning she woke up with dizziness and headache. She also had palpitation with heart rate of 138/min, her blood pressure elevated at 170/110. Denied any syncope but had constant chest tightness. She took 50 mg of metoprolol without significant help. She called EMS and was brought to [facility]. In ED, she was found to have negative chest x-ray and slightly elevated troponin at 0.33 and 0.35 on two tests, 2 hours apart. Patient was treated with aspirin and transferred to [facility] hospital because they did not have cardiologist in the weekend. She did have cough with chronic sinusitis symptoms in the past week. Patient admitted to hospitalist service for further cardiac workup. Cardiology consulted  Hospital Course:  Chest pain Patient rule out for ACS with serial cardiac enzymes and EKG here in the hospital. Her chest pain symptoms appear to be atypical and likely pleuritic versus musculoskeletal. Patient  seen by cardiology and had a Myoview done which was negative for ischemia and showed EF of 78%. 2-D echo done results will be available today due to technical issue and should be followed up as outpatient. -Patient is  asymptomatic and does not have further symptoms. She is stable to be discharged and follow-up with her PCP in 1 week. I will discharge her on baby aspirin. She should continue statin and metoprolol. -Cardiology office will contact her with outpatient follow-up appointment  Dizziness and headache Head CT done on admission unremarkable. Reports having an MRI done last month which was negative as well. Blood pressure remained stable. Symptoms have now resolved.  Hypertension Continue home dose Cardizem and metoprolol  GERD Continue PPI   Procedures:  Lexiscan Myoview  2-D echo   Consultations:  Cardiology  Discharge Exam: Filed Vitals:   04/03/14 1300  BP: 113/79  Pulse: 76  Temp: 98.6 F (37 C)  Resp: 16    General: Middle aged female in no acute distress HEENT: No pallor, moist oral mucosa Chest: Clear to auscultation bilaterally, no added sounds CVS: Normal S1 and S2, no murmurs Abdomen: Soft, nondistended, nontender Extremities: Warm, no edema  Discharge Instructions You were cared for by a hospitalist during your hospital stay. If you have any questions about your discharge medications or the care you received while you were in the hospital after you are discharged, you can call the unit and asked to speak with the hospitalist on call if the hospitalist that took care of you is not available. Once you are discharged, your primary care physician will handle any further medical issues. Please note that NO REFILLS for any discharge medications  will be authorized once you are discharged, as it is imperative that you return to your primary care physician (or establish a relationship with a primary care physician if you do not have one) for your aftercare needs  so that they can reassess your need for medications and monitor your lab values.   Current Discharge Medication List    START taking these medications   Details  aspirin 81 MG tablet Take 1 tablet (81 mg total) by mouth daily. Qty: 30 tablet, Refills: 0      CONTINUE these medications which have NOT CHANGED   Details  acetaminophen (TYLENOL) 500 MG tablet Take 1,000 mg by mouth daily as needed for pain.    Cholecalciferol (D 1000) 1000 UNITS tablet Take 1,000 Units by mouth every evening.     diltiazem (CARDIZEM CD) 180 MG 24 hr capsule Take 180 mg by mouth daily.     hydrALAZINE (APRESOLINE) 50 MG tablet Take 50 mg by mouth 2 (two) times daily.    loratadine (CLARITIN) 10 MG tablet Take 10 mg by mouth daily.     LORazepam (ATIVAN) 1 MG tablet Take one  Every 8 hours for pressure in head Qty: 20 tablet, Refills: 0    lovastatin (MEVACOR) 20 MG tablet Take 20 mg by mouth at bedtime.    metoprolol (LOPRESSOR) 50 MG tablet Take 50 mg by mouth 2 (two) times daily.    omeprazole (PRILOSEC) 20 MG capsule Take 20 mg by mouth daily.        Allergies  Allergen Reactions  . Zithromax [Azithromycin] Anaphylaxis  . Ace Inhibitors Swelling  . Ciprofloxacin Nausea And Vomiting    Also: kidney failure   . Other Swelling, Other (See Comments) and Cough    Sneezing allergy to cats  . Medrol [Methylprednisolone] Palpitations    Heart racing, increased BP  . Tetracyclines & Related Palpitations    Heart racing, increased bp   Follow-up Information    Follow up with Herminio Commons, MD.   Specialty:  Cardiology   Why:  office will call you   Contact information:   [place] [address] [phone]       Follow up with Gar Ponto, MD. Schedule an appointment as soon as possible for a visit in 1 week.   Specialty:  Family Medicine   Contact information:   [place] [address] [phone]        The results of significant diagnostics  from this hospitalization (including imaging, microbiology, ancillary and laboratory) are listed below for reference.    Significant Diagnostic Studies: Ct Head Wo Contrast  04/03/2014   CLINICAL DATA:  Initial evaluation for dizziness, headache.  EXAM: CT HEAD WITHOUT CONTRAST  TECHNIQUE: Contiguous axial images were obtained from the base of the skull through the vertex without intravenous contrast.  COMPARISON:  Prior MRI from 02/22/2014.  FINDINGS: Postoperative changes from prior surgical aneurysm clipping of right ICA terminus aneurysm seen.  No acute large vessel territory infarct gray-white matter differentiation maintained. No mass lesion or midline shift. No hydrocephalus. No extra-axial fluid collection.  Other than a craniotomy defect, calvarium intact. No acute soft tissue abnormality seen about the scalp. Orbits within normal limits.  Paranasal sinuses and mastoid air cells are clear.  IMPRESSION: 1. No acute intracranial process. 2. Sequela of prior right ICA terminus aneurysm clipping.   Electronically Signed   By: Jeannine Boga M.D.   On: 04/03/2014 05:01  Nm Myocar Multi W/spect W/wall Motion / Ef  04/03/2014   CLINICAL DATA:  Chest pain  EXAM: MYOCARDIAL IMAGING WITH SPECT (REST AND PHARMACOLOGIC-STRESS)  GATED LEFT VENTRICULAR WALL MOTION STUDY  LEFT VENTRICULAR EJECTION FRACTION  TECHNIQUE: Standard myocardial SPECT imaging was performed after resting intravenous injection of 10 mCi Tc-72m sestamibi. Subsequently, intravenous infusion of Lexiscan was performed under the supervision of the Cardiology staff. At peak effect of the drug, 30 mCi Tc-68m sestamibi was injected intravenously and standard myocardial SPECT imaging was performed. Quantitative gated imaging was also performed to evaluate left ventricular wall motion, and estimate left ventricular ejection fraction.  COMPARISON:  None.  FINDINGS: Perfusion: No decreased activity in the left ventricle on stress imaging to  suggest reversible ischemia or infarction.  Wall Motion: Normal left ventricular wall motion. No left ventricular dilation.  Left Ventricular Ejection Fraction: 78 %  End diastolic volume 70 ml  End systolic volume 16 ml  IMPRESSION: 1. No reversible ischemia or infarction.  2. Normal left ventricular wall motion.  3. Left ventricular ejection fraction 78%  4. Low-risk stress test findings*.  *2012 Appropriate Use Criteria for Coronary Revascularization Focused Update: J Am Coll Cardiol. 2446;28(6):381-771. http://content.airportbarriers.com.aspx?articleid=1201161   Electronically Signed   By: Maryclare Bean M.D.   On: 04/03/2014 14:44    Microbiology: No results found for this or any previous visit (from the past 240 hour(s)).   Labs: Basic Metabolic Panel:  Recent Labs Lab 04/03/14 0518  NA 142  K 3.9  CL 105  CO2 25  GLUCOSE 85  BUN 15  CREATININE 1.01  CALCIUM 9.8   Liver Function Tests:  Recent Labs Lab 04/03/14 0518  AST 14  ALT 8  ALKPHOS 64  BILITOT 0.5  PROT 6.9  ALBUMIN 3.5   No results for input(s): LIPASE, AMYLASE in the last 168 hours. No results for input(s): AMMONIA in the last 168 hours. CBC:  Recent Labs Lab 04/03/14 0518  WBC 7.5  NEUTROABS 4.8  HGB 11.3*  HCT 35.6*  MCV 87.5  PLT 361   Cardiac Enzymes:  Recent Labs Lab 04/03/14 0052 04/03/14 0518 04/03/14 1055  TROPONINI <0.30 <0.30 <0.30   BNP: BNP (last 3 results) No results for input(s): PROBNP in the last 8760 hours. CBG:  Recent Labs Lab 04/03/14 0752  GLUCAP 80       Signed:  Asna Muldrow  [facility] 04/03/2014, 3:03 PM

## 2014-04-03 NOTE — Progress Notes (Signed)
Subjective:  No more chest pain since she was admitted here.  Complaint of jaw pain when had heparin injection.  Evidently troponins were elevated at [facility], but are normal here and EKG is normal here.  Objective:  Vital Signs in the last 24 hours: BP 125/66 mmHg  Pulse 56  Temp(Src) 98.4 F (36.9 C) (Oral)  Resp 18  Ht 5\' 2"  (1.575 m)  Wt 91.7 kg (202 lb 2.6 oz)  BMI 36.97 kg/m2  SpO2 99%  Physical Exam: Obese black female in no acute distress Lungs:  Clear Cardiac:  Regular rhythm, normal S1 and S2, no S3 Abdomen:  Soft, nontender, no masses Extremities:  No edema present  Intake/Output from previous day:    Weight Filed Weights   04/02/14 2255  Weight: 91.7 kg (202 lb 2.6 oz)    Lab Results: Basic Metabolic Panel:  Recent Labs  04/03/14 0518  NA 142  K 3.9  CL 105  CO2 25  GLUCOSE 85  BUN 15  CREATININE 1.01   CBC:  Recent Labs  04/03/14 0518  WBC 7.5  NEUTROABS 4.8  HGB 11.3*  HCT 35.6*  MCV 87.5  PLT 361   Cardiac Enzymes:  Recent Labs  04/03/14 0052 04/03/14 0518  TROPONINI <0.30 <0.30    Telemetry: Sinus rhythm  Assessment/Plan:  1.  Chest pain with negative myocardial enzymes so far and a normal EKG 2.  History of palpitations. 3.  Hypertension.  Recommendations:  To have a Myoview done today.  Echocardiogram is pending.  Troponins here are negative.     Kerry Hough  MD [facility]  04/03/2014, 10:03 AM

## 2014-04-03 NOTE — Consult Note (Signed)
Admit date: 04/02/2014 Referring Physician  Dr. Blaine Hamper Primary Physician Gar Ponto, MD Primary Cardiologist  n/a Reason for Consultation  Troponin elevation  HPI: [name] [name] is a pleasant 53 y.o female with hypertension, R ICA intracranial aneurysm s/p clipping, congenital small kidney, GERD, hyperlipidemia, palpitations, hx of colitis who presents with chief complaint of chest tightness.   Pt is a direct transfer from [facility]. She presented to the OSH after having chest tightness and palpitations at home. Her symptoms started with headache in the morning. She felt fatigued and had severe headache so she slept in. At about 11, she started having palpitations and chest tightness and so she checked her BP at it was 170/110. She took extra dose of her lopressor. No radiation of her pain. No associated diaphoresis, dyspnea, nausea with her chest tightness. Her symptoms improved by the time she had gotten to the emergency department. She was normotensive at the OSH. Pt has chronic cough which has progressed over the last week. Clear sputum production. ROS negative for fever, chills, vomiting, LE edema, Dyspnea.   OSH labs - wbc 10.7, hgb normal, gfr 55, trop 0.33->0.35->0.22, INR 1.0, CXR negative.  OSH VS - normotensive.    PMH:   Past Medical History  Diagnosis Date  . Overweight     Obesity  . Hypertension   . Hyperlipidemia   . Palpitations   . Chest pain     denied on 06/18/2012  . Hypoglycemia   . Uterine fibroid     pt. states that she has a "closed cervix"   . Sinusitis, acute     seen by PCP- 06/17/2012, will treat /w augmentin & tussin/DM  . H/O hiatal hernia   . Headache(784.0)     using tylenol prn  . Aneurysm     brain  . Normal cardiac stress test     pt. released fr. Dr. [name] care in 10/2011, all findings w/i normal limits  . H. pylori infection     2012- stomach biopsy  . GERD (gastroesophageal reflux disease)     h/o colitis   . Family history of  anesthesia complication     brother & neice "flat lined" during induction: neice d/t a medication given for a blood clotting problem; brother d/t "miscalculated amount of anesthesia"  . Chronic kidney disease     L - upper pole- defect, doesn't effect       PSH:   Past Surgical History  Procedure Laterality Date  . Colonoscopy    . Lipoma excision      buttock 10/2010  . Craniotomy Right 06/24/2012    Procedure: CRANIOTOMY INTRACRANIAL ANEURYSM FOR CAROTID;  Surgeon: [name], MD;  Location: [place] [facility];  Service: Neurosurgery;  Laterality: Right;  RIGHT Pterional Craniotomy for aneurysm   Allergies:  Zithromax; Ace inhibitors; Ciprofloxacin; Other; Medrol; and Tetracyclines & related Prior to Admit Meds:   Prior to Admission medications   Medication Sig Start Date End Date Taking? Authorizing Provider  acetaminophen (TYLENOL) 500 MG tablet Take 1,000 mg by mouth daily as needed for pain.    Historical Provider, MD  Cholecalciferol (D 1000) 1000 UNITS tablet Take 1,000 Units by mouth every evening.     Historical Provider, MD  diltiazem (CARDIZEM CD) 180 MG 24 hr capsule Take 180 mg by mouth daily.     Historical Provider, MD  hydrALAZINE (APRESOLINE) 50 MG tablet Take 50 mg by mouth 2 (two) times daily. 08/29/12   Historical  Provider, MD  loratadine (CLARITIN) 10 MG tablet Take 10 mg by mouth daily.     Historical Provider, MD  LORazepam (ATIVAN) 1 MG tablet Take one  Every 8 hours for pressure in head 09/05/12   Maudry Diego, MD  lovastatin (MEVACOR) 20 MG tablet Take 20 mg by mouth at bedtime.    Historical Provider, MD  metoprolol (LOPRESSOR) 50 MG tablet Take 50 mg by mouth 2 (two) times daily.    Historical Provider, MD  omeprazole (PRILOSEC) 20 MG capsule Take 20 mg by mouth daily.     Historical Provider, MD   Fam HX:    Family History  Problem Relation Age of Onset  . Hypertension Other   . Coronary artery disease Other    Social HX:    History   Social History  .  Marital Status: Divorced    Spouse Name: N/A    Number of Children: N/A  . Years of Education: N/A   Occupational History  . Full time [place] History Main Topics  . Smoking status: Former Smoker -- 0.80 packs/day for 15 years    Types: Cigarettes    Quit date: 04/28/2004  . Smokeless tobacco: Former Systems developer    Quit date: 06/19/2003  . Alcohol Use: No  . Drug Use: No  . Sexual Activity: No   Other Topics Concern  . Not on file   Social History Narrative   Single     ROS:  All 11 ROS were addressed and are negative except what is stated in the HPI   Physical Exam: Blood pressure 125/66, pulse 56, temperature 98.4 F (36.9 C), temperature source Oral, resp. rate 18, height 5\' 2"  (1.575 m), weight 91.7 kg (202 lb 2.6 oz), SpO2 99 %.   Gen - pt is alert, awake and oriented  Neck - flat JVP Dry mucosa Lungs -ctabl CV - rrr, s1s2 heard, no m/r/g Abd - +bs, nd, nt Ext - no edema      Labs: Lab Results  Component Value Date   WBC 16.6* 08/30/2012   HGB 11.3* 08/30/2012   HCT 32.9* 08/30/2012   MCV 84.1 08/30/2012   PLT 144* 08/30/2012    No results for input(s): NA, K, CL, CO2, BUN, CREATININE, CALCIUM, PROT, BILITOT, ALKPHOS, ALT, AST, GLUCOSE in the last 168 hours.  Invalid input(s): LABALBU No results for input(s): CKTOTAL, CKMB, TROPONINI in the last 72 hours. No results found for: CHOL, HDL, LDLCALC, TRIG No results found for: DDIMER   Radiology:  No results found. Personally viewed.  EKG:  Pending here. OSH ECG with NSR, septal q waves (similar to prior ECG in 2014)  ASSESSMENT Troponin elevation Hypertension Palpitations Headache  PLAN:   - troponin elevation not consistent with ACS. No clear rise and fall. Symptoms are not the most typical for angina.  - would hold off anti-coagulation at this time - monitor on telemetry  - normotensive at this time. Hypertension was in the setting of severe headache. I suspect that controlling  headache will help control hypertension and chest tightness - echo in am.  - NPO p MN for possible MPI in am. If pt has no more angina and echo unremarkable - reasonable to discharge with outpatient followup with cardiology.   Thank you for this consult.   Orson Gear, MD  04/03/2014  12:04 AM

## 2014-04-15 ENCOUNTER — Ambulatory Visit (INDEPENDENT_AMBULATORY_CARE_PROVIDER_SITE_OTHER): Payer: BC Managed Care – PPO | Admitting: Cardiology

## 2014-04-15 ENCOUNTER — Encounter: Payer: Self-pay | Admitting: Cardiology

## 2014-04-15 VITALS — BP 104/71 | HR 77 | Ht 63.0 in | Wt 200.0 lb

## 2014-04-15 DIAGNOSIS — R002 Palpitations: Secondary | ICD-10-CM

## 2014-04-15 NOTE — Patient Instructions (Signed)
Your physician has recommended that you wear a 14 day event monitor. Event monitors are medical devices that record the heart's electrical activity. Doctors most often [place] these monitors to diagnose arrhythmias. Arrhythmias are problems with the speed or rhythm of the heartbeat. The monitor is a small, portable device. You can wear one while you do your normal daily activities. This is usually used to diagnose what is causing palpitations/syncope (passing out). Office will contact with results via phone or letter.   Your physician has requested that you regularly monitor and record your blood pressure readings at home. Please take readings approximately 2 hours after medication.  Take 3-4 x week till next visit and bring with you for MD review.   Continue all current medications. Follow up in  3-4 weeks

## 2014-04-15 NOTE — Progress Notes (Signed)
Clinical Summary [name] [name] is a 53 y.o.female seen today for a hospital follow up appointment, this is our first visit together.  1. Palpitations - recent admit 03/2014 with palpitations and chest pain. She describes symptoms were more chest tightness. On that day was hypertensive and tachycardic. Felt palpitations. - MPI without evidence of ischemia, LVEF 78%. Echo "normal LVEF". TSH 1.4 - occasional palpitations on and off, was seeing Dr Dannielle Burn a few years ago - palpitations have increased over this last week, typically when first awakes in AM - compliant with metoprolol and diltiazem.  - coffee decaf only, occasional tea, limited sodas, no energy drinks, no EtOH - no set pattern on symptoms   Past Medical History  Diagnosis Date  . Overweight(278.02)     Obesity  . Hypertension   . Hyperlipidemia   . Palpitations   . Chest pain     denied on 06/18/2012  . Hypoglycemia   . Uterine fibroid     pt. states that she has a "closed cervix"   . Sinusitis, acute     seen by PCP- 06/17/2012, will treat /w augmentin & tussin/DM  . H/O hiatal hernia   . Headache(784.0)     using tylenol prn  . Aneurysm     brain  . Normal cardiac stress test     pt. released fr. Dr. [name] care in 10/2011, all findings w/i normal limits  . H. pylori infection     2012- stomach biopsy  . GERD (gastroesophageal reflux disease)     h/o colitis   . Family history of anesthesia complication     brother & neice "flat lined" during induction: neice d/t a medication given for a blood clotting problem; brother d/t "miscalculated amount of anesthesia"  . Chronic kidney disease     L - upper pole- defect, doesn't effect        Allergies  Allergen Reactions  . Zithromax [Azithromycin] Anaphylaxis  . Ace Inhibitors Swelling  . Ciprofloxacin Nausea And Vomiting    Also: kidney failure   . Other Swelling, Other (See Comments) and Cough    Sneezing allergy to cats  . Medrol  [Methylprednisolone] Palpitations    Heart racing, increased BP  . Tetracyclines & Related Palpitations    Heart racing, increased bp     Current Outpatient Prescriptions  Medication Sig Dispense Refill  . acetaminophen (TYLENOL) 500 MG tablet Take 1,000 mg by mouth daily as needed for pain.    [name] aspirin 81 MG tablet Take 1 tablet (81 mg total) by mouth daily. 30 tablet 0  . chlorpheniramine-HYDROcodone (TUSSIONEX) 10-8 MG/5ML LQCR Take 5 mLs by mouth daily as needed for cough.   0  . Cholecalciferol (D 1000) 1000 UNITS tablet Take 1,000 Units by mouth every evening.     . diltiazem (CARDIZEM CD) 180 MG 24 hr capsule Take 180 mg by mouth daily.     . hydrALAZINE (APRESOLINE) 50 MG tablet Take 50 mg by mouth 2 (two) times daily.    . IRON PO Take 150 mg by mouth daily.    [name] loratadine (CLARITIN) 10 MG tablet Take 10 mg by mouth daily.     [name] LORazepam (ATIVAN) 1 MG tablet Take one  Every 8 hours for pressure in head (Patient not taking: Reported on 04/03/2014) 20 tablet 0  . metoprolol (LOPRESSOR) 50 MG tablet Take 50 mg by mouth 2 (two) times daily.    . pantoprazole (PROTONIX) 40 MG tablet Take  40 mg by mouth daily.  1  . simvastatin (ZOCOR) 5 MG tablet Take 5 mg by mouth daily.     No current facility-administered medications for this visit.     Past Surgical History  Procedure Laterality Date  . Colonoscopy    . Lipoma excision      buttock 10/2010  . Craniotomy Right 06/24/2012    Procedure: CRANIOTOMY INTRACRANIAL ANEURYSM FOR CAROTID;  Surgeon: [name], MD;  Location: [place] [facility];  Service: Neurosurgery;  Laterality: Right;  RIGHT Pterional Craniotomy for aneurysm     Allergies  Allergen Reactions  . Zithromax [Azithromycin] Anaphylaxis  . Ace Inhibitors Swelling  . Ciprofloxacin Nausea And Vomiting    Also: kidney failure   . Other Swelling, Other (See Comments) and Cough    Sneezing allergy to cats  . Medrol [Methylprednisolone] Palpitations    Heart racing,  increased BP  . Tetracyclines & Related Palpitations    Heart racing, increased bp      Family History  Problem Relation Age of Onset  . Hypertension Other   . Coronary artery disease Other      Social History [name] [name] reports that she quit smoking about 9 years ago. Her smoking use included Cigarettes. She has a 12 pack-year smoking history. She quit smokeless tobacco use about 10 years ago. Ms. Leopard reports that she does not drink alcohol.   Review of Systems CONSTITUTIONAL: No weight loss, fever, chills, weakness or fatigue.  HEENT: Eyes: No visual loss, blurred vision, double vision or yellow sclerae.No hearing loss, sneezing, congestion, runny nose or sore throat.  SKIN: No rash or itching.  CARDIOVASCULAR: per HPI RESPIRATORY: No shortness of breath, cough or sputum.  GASTROINTESTINAL: No anorexia, nausea, vomiting or diarrhea. No abdominal pain or blood.  GENITOURINARY: No burning on urination, no polyuria NEUROLOGICAL: No headache, dizziness, syncope, paralysis, ataxia, numbness or tingling in the extremities. No change in bowel or bladder control.  MUSCULOSKELETAL: No muscle, back pain, joint pain or stiffness.  LYMPHATICS: No enlarged nodes. No history of splenectomy.  PSYCHIATRIC: No history of depression or anxiety.  ENDOCRINOLOGIC: No reports of sweating, cold or heat intolerance. No polyuria or polydipsia.  [name]   Physical Examination p 77 bp 104/71 Wt 200 lbs BMI 35 Gen: resting comfortably, no acute distress HEENT: no scleral icterus, pupils equal round and reactive, no palptable cervical adenopathy,  CV: RRR, no m/r/g, no JVD, no carotid bruits Resp: Clear to auscultation bilaterally GI: abdomen is soft, non-tender, non-distended, normal bowel sounds, no hepatosplenomegaly MSK: extremities are warm, no edema.  Skin: warm, no rash Neuro:  no focal deficits Psych: appropriate affect   Diagnostic Studies 03/2014 Echo Study Conclusions  - Left  ventricle: The cavity size was normal. Systolic function was normal. Wall motion was normal; there were no regional wall motion abnormalities.  03/2014 Lexiscan MPI IMPRESSION: 1. No reversible ischemia or infarction.  2. Normal left ventricular wall motion.  3. Left ventricular ejection fraction 78%  4. Low-risk stress test findings*.  Assessment and Plan   1. Palpitations - progression in symptoms despite being on beta blocker and CCB. Will obtain 14 day event monitor to further evaluate    F/u 3-4 weeks   Arnoldo Lenis, M.D.

## 2014-04-18 ENCOUNTER — Other Ambulatory Visit: Payer: Self-pay | Admitting: *Deleted

## 2014-04-18 DIAGNOSIS — R002 Palpitations: Secondary | ICD-10-CM

## 2014-04-28 ENCOUNTER — Telehealth: Payer: Self-pay | Admitting: *Deleted

## 2014-04-28 NOTE — Telephone Encounter (Signed)
Patient notified

## 2014-04-28 NOTE — Telephone Encounter (Signed)
That would be fine 

## 2014-04-28 NOTE — Telephone Encounter (Signed)
Message left on voice mail - Want to know if okay to take Keflex & Tylenol #3 - dentist wanted to pull wisdom tooth.  Currently on heart monitor for Dr. Harl Bowie.

## 2014-05-11 ENCOUNTER — Ambulatory Visit: Payer: BLUE CROSS/BLUE SHIELD | Admitting: Cardiology

## 2014-05-11 NOTE — Progress Notes (Unsigned)
Clinical Summary [name] [name] is a 54 y.o.female  1. Palpitations - recent admit 03/2014 with palpitations and chest pain. She describes symptoms were more chest tightness. On that day was hypertensive and tachycardic. Felt palpitations. - MPI without evidence of ischemia, LVEF 78%. Echo "normal LVEF". TSH 1.4 - occasional palpitations on and off, was seeing Dr Dannielle Burn a few years ago - palpitations have increased over this last week, typically when first awakes in AM - compliant with metoprolol and diltiazem.  - coffee decaf only, occasional tea, limited sodas, no energy drinks, no EtOH - no set pattern on symptoms Past Medical History  Diagnosis Date  . Overweight(278.02)     Obesity  . Hypertension   . Hyperlipidemia   . Palpitations   . Chest pain     denied on 06/18/2012  . Hypoglycemia   . Uterine fibroid     pt. states that she has a "closed cervix"   . Sinusitis, acute     seen by PCP- 06/17/2012, will treat /w augmentin & tussin/DM  . H/O hiatal hernia   . Headache(784.0)     using tylenol prn  . Aneurysm     brain  . Normal cardiac stress test     pt. released fr. Dr. [name] care in 10/2011, all findings w/i normal limits  . H. pylori infection     2012- stomach biopsy  . GERD (gastroesophageal reflux disease)     h/o colitis   . Family history of anesthesia complication     brother & neice "flat lined" during induction: neice d/t a medication given for a blood clotting problem; brother d/t "miscalculated amount of anesthesia"  . Chronic kidney disease     L - upper pole- defect, doesn't effect        Allergies  Allergen Reactions  . Zithromax [Azithromycin] Anaphylaxis  . Ace Inhibitors Swelling  . Ciprofloxacin Nausea And Vomiting    Also: kidney failure   . Other Swelling, Other (See Comments) and Cough    Sneezing allergy to cats  . Medrol [Methylprednisolone] Palpitations    Heart racing, increased BP  . Tetracyclines & Related Palpitations     Heart racing, increased bp     Current Outpatient Prescriptions  Medication Sig Dispense Refill  . acetaminophen (TYLENOL) 500 MG tablet Take 1,000 mg by mouth daily as needed for pain.    [name] aspirin 81 MG tablet Take 1 tablet (81 mg total) by mouth daily. 30 tablet 0  . chlorpheniramine-HYDROcodone (TUSSIONEX) 10-8 MG/5ML LQCR Take 5 mLs by mouth daily as needed for cough.   0  . Cholecalciferol (D 1000) 1000 UNITS tablet Take 1,000 Units by mouth every evening.     . diltiazem (CARDIZEM CD) 180 MG 24 hr capsule Take 180 mg by mouth daily.     . hydrALAZINE (APRESOLINE) 50 MG tablet Take 50 mg by mouth 2 (two) times daily.    . IRON PO Take 150 mg by mouth daily.    [name] loratadine (CLARITIN) 10 MG tablet Take 10 mg by mouth daily.     [name] lovastatin (MEVACOR) 20 MG tablet Take 20 mg by mouth at bedtime.    . metoprolol (LOPRESSOR) 50 MG tablet Take 50 mg by mouth 2 (two) times daily.    . pantoprazole (PROTONIX) 40 MG tablet Take 40 mg by mouth daily.  1  . topiramate (TOPAMAX) 50 MG tablet Take 1 tablet by mouth 2 (two) times daily.  No current facility-administered medications for this visit.     Past Surgical History  Procedure Laterality Date  . Colonoscopy    . Lipoma excision      buttock 10/2010  . Craniotomy Right 06/24/2012    Procedure: CRANIOTOMY INTRACRANIAL ANEURYSM FOR CAROTID;  Surgeon: [name], MD;  Location: [facility];  Service: Neurosurgery;  Laterality: Right;  RIGHT Pterional Craniotomy for aneurysm     Allergies  Allergen Reactions  . Zithromax [Azithromycin] Anaphylaxis  . Ace Inhibitors Swelling  . Ciprofloxacin Nausea And Vomiting    Also: kidney failure   . Other Swelling, Other (See Comments) and Cough    Sneezing allergy to cats  . Medrol [Methylprednisolone] Palpitations    Heart racing, increased BP  . Tetracyclines & Related Palpitations    Heart racing, increased bp      Family History  Problem Relation Age of Onset  .  Hypertension Other   . Coronary artery disease Other      Social History [name] [name] reports that she quit smoking about 10 years ago. Her smoking use included Cigarettes. She started smoking about 25 years ago. She has a 12 pack-year smoking history. She quit smokeless tobacco use about 10 years ago. Ms. Davern reports that she does not drink alcohol.   Review of Systems CONSTITUTIONAL: No weight loss, fever, chills, weakness or fatigue.  HEENT: Eyes: No visual loss, blurred vision, double vision or yellow sclerae.No hearing loss, sneezing, congestion, runny nose or sore throat.  SKIN: No rash or itching.  CARDIOVASCULAR:  RESPIRATORY: No shortness of breath, cough or sputum.  GASTROINTESTINAL: No anorexia, nausea, vomiting or diarrhea. No abdominal pain or blood.  GENITOURINARY: No burning on urination, no polyuria NEUROLOGICAL: No headache, dizziness, syncope, paralysis, ataxia, numbness or tingling in the extremities. No change in bowel or bladder control.  MUSCULOSKELETAL: No muscle, back pain, joint pain or stiffness.  LYMPHATICS: No enlarged nodes. No history of splenectomy.  PSYCHIATRIC: No history of depression or anxiety.  ENDOCRINOLOGIC: No reports of sweating, cold or heat intolerance. No polyuria or polydipsia.  [name]   Physical Examination There were no vitals filed for this visit. There were no vitals filed for this visit.  Gen: resting comfortably, no acute distress HEENT: no scleral icterus, pupils equal round and reactive, no palptable cervical adenopathy,  CV Resp: Clear to auscultation bilaterally GI: abdomen is soft, non-tender, non-distended, normal bowel sounds, no hepatosplenomegaly MSK: extremities are warm, no edema.  Skin: warm, no rash Neuro:  no focal deficits Psych: appropriate affect   Diagnostic Studies  03/2014 Echo Study Conclusions  - Left ventricle: The cavity size was normal. Systolic function was normal. Wall motion was normal;  there were no regional wall motion abnormalities.  03/2014 Lexiscan MPI IMPRESSION: 1. No reversible ischemia or infarction.  2. Normal left ventricular wall motion.  3. Left ventricular ejection fraction 78%  4. Low-risk stress test findings*.  03/2014 Event Monitor Symptoms correlate with NSR. One episode of fluttering showed 2 PACs. No significant arrhythmias   Assessment and Plan  1. Palpitations - progression in symptoms despite being on beta blocker and CCB. Will obtain 14 day event monitor to further evaluate    F/u 3-4 weeks      Arnoldo Lenis, M.D., F.A.C.C.

## 2014-05-12 ENCOUNTER — Encounter: Payer: Self-pay | Admitting: Cardiology

## 2014-05-16 ENCOUNTER — Ambulatory Visit (INDEPENDENT_AMBULATORY_CARE_PROVIDER_SITE_OTHER): Payer: BLUE CROSS/BLUE SHIELD | Admitting: Cardiology

## 2014-05-16 ENCOUNTER — Encounter: Payer: Self-pay | Admitting: Cardiology

## 2014-05-16 VITALS — BP 118/68 | HR 68 | Ht 62.0 in | Wt 204.0 lb

## 2014-05-16 DIAGNOSIS — R002 Palpitations: Secondary | ICD-10-CM

## 2014-05-16 NOTE — Patient Instructions (Signed)
Your physician wants you to follow-up in: 1 year with Dr. Branch  You will receive a reminder letter in the mail two months in advance. If you don't receive a letter, please call our office to schedule the follow-up appointment.  Your physician recommends that you continue on your current medications as directed. Please refer to the Current Medication list given to you today.  Thank you for choosing [facility]!!   

## 2014-05-16 NOTE — Progress Notes (Signed)
Clinical Summary [name] [name] is a 54 y.o.female seen today for follow up of the following medical problems.  1. Palpitations - recent admit 03/2014 with palpitations and chest pain. On that day was hypertensive and tachycardic on admission. - MPI without evidence of ischemia, LVEF 78%. Echo "normal LVEF". TSH 1.4. Event monitor with occasional PACs, no significant arrhythmias - on metoprolol and diltiazem, she is limiting caffeine intake.  - no significant symptoms since last visit. Can have some occasional epigastric pain brought on after eating.      Past Medical History  Diagnosis Date  . Overweight(278.02)     Obesity  . Hypertension   . Hyperlipidemia   . Palpitations   . Chest pain     denied on 06/18/2012  . Hypoglycemia   . Uterine fibroid     pt. states that she has a "closed cervix"   . Sinusitis, acute     seen by PCP- 06/17/2012, will treat /w augmentin & tussin/DM  . H/O hiatal hernia   . Headache(784.0)     using tylenol prn  . Aneurysm     brain  . Normal cardiac stress test     pt. released fr. Dr. [name] care in 10/2011, all findings w/i normal limits  . H. pylori infection     2012- stomach biopsy  . GERD (gastroesophageal reflux disease)     h/o colitis   . Family history of anesthesia complication     brother & neice "flat lined" during induction: neice d/t a medication given for a blood clotting problem; brother d/t "miscalculated amount of anesthesia"  . Chronic kidney disease     L - upper pole- defect, doesn't effect        Allergies  Allergen Reactions  . Zithromax [Azithromycin] Anaphylaxis  . Ace Inhibitors Swelling  . Ciprofloxacin Nausea And Vomiting    Also: kidney failure   . Other Swelling, Other (See Comments) and Cough    Sneezing allergy to cats  . Medrol [Methylprednisolone] Palpitations    Heart racing, increased BP  . Tetracyclines & Related Palpitations    Heart racing, increased bp     Current Outpatient  Prescriptions  Medication Sig Dispense Refill  . acetaminophen (TYLENOL) 500 MG tablet Take 1,000 mg by mouth daily as needed for pain.    [name] aspirin 81 MG tablet Take 1 tablet (81 mg total) by mouth daily. 30 tablet 0  . chlorpheniramine-HYDROcodone (TUSSIONEX) 10-8 MG/5ML LQCR Take 5 mLs by mouth daily as needed for cough.   0  . Cholecalciferol (D 1000) 1000 UNITS tablet Take 1,000 Units by mouth every evening.     . diltiazem (CARDIZEM CD) 180 MG 24 hr capsule Take 180 mg by mouth daily.     . hydrALAZINE (APRESOLINE) 50 MG tablet Take 50 mg by mouth 2 (two) times daily.    . IRON PO Take 150 mg by mouth daily.    [name] loratadine (CLARITIN) 10 MG tablet Take 10 mg by mouth daily.     [name] lovastatin (MEVACOR) 20 MG tablet Take 20 mg by mouth at bedtime.    . metoprolol (LOPRESSOR) 50 MG tablet Take 50 mg by mouth 2 (two) times daily.    . pantoprazole (PROTONIX) 40 MG tablet Take 40 mg by mouth daily.  1  . topiramate (TOPAMAX) 50 MG tablet Take 1 tablet by mouth 2 (two) times daily.     No current facility-administered medications for this visit.  Past Surgical History  Procedure Laterality Date  . Colonoscopy    . Lipoma excision      buttock 10/2010  . Craniotomy Right 06/24/2012    Procedure: CRANIOTOMY INTRACRANIAL ANEURYSM FOR CAROTID;  Surgeon: [name], MD;  Location: [facility];  Service: Neurosurgery;  Laterality: Right;  RIGHT Pterional Craniotomy for aneurysm     Allergies  Allergen Reactions  . Zithromax [Azithromycin] Anaphylaxis  . Ace Inhibitors Swelling  . Ciprofloxacin Nausea And Vomiting    Also: kidney failure   . Other Swelling, Other (See Comments) and Cough    Sneezing allergy to cats  . Medrol [Methylprednisolone] Palpitations    Heart racing, increased BP  . Tetracyclines & Related Palpitations    Heart racing, increased bp      Family History  Problem Relation Age of Onset  . Hypertension Other   . Coronary artery disease Other       Social History [name] [name] reports that she quit smoking about 10 years ago. Her smoking use included Cigarettes. She started smoking about 25 years ago. She has a 12 pack-year smoking history. She quit smokeless tobacco use about 10 years ago. [name] [name] reports that she does not drink alcohol.   Review of Systems CONSTITUTIONAL: No weight loss, fever, chills, weakness or fatigue.  HEENT: Eyes: No visual loss, blurred vision, double vision or yellow sclerae.No hearing loss, sneezing, congestion, runny nose or sore throat.  SKIN: No rash or itching.  CARDIOVASCULAR: per HPI RESPIRATORY: No shortness of breath, cough or sputum.  GASTROINTESTINAL: No anorexia, nausea, vomiting or diarrhea. + epigastric pain GENITOURINARY: No burning on urination, no polyuria NEUROLOGICAL: No headache, dizziness, syncope, paralysis, ataxia, numbness or tingling in the extremities. No change in bowel or bladder control.  MUSCULOSKELETAL: No muscle, back pain, joint pain or stiffness.  LYMPHATICS: No enlarged nodes. No history of splenectomy.  PSYCHIATRIC: No history of depression or anxiety.  ENDOCRINOLOGIC: No reports of sweating, cold or heat intolerance. No polyuria or polydipsia.  [name]   Physical Examination p 68 bp 118/68 Wt 204 lbs BMI 37 Gen: resting comfortably, no acute distress HEENT: no scleral icterus, pupils equal round and reactive, no palptable cervical adenopathy,  CV: RRR, no m/r/g, no JVD, no carotid bruits Resp: Clear to auscultation bilaterally GI: abdomen is soft, non-tender, non-distended, normal bowel sounds, no hepatosplenomegaly MSK: extremities are warm, no edema.  Skin: warm, no rash Neuro:  no focal deficits Psych: appropriate affect   Diagnostic Studies 03/2014 Echo Study Conclusions  - Left ventricle: The cavity size was normal. Systolic function was normal. Wall motion was normal; there were no regional wall motion abnormalities.  03/2014 Lexiscan  MPI IMPRESSION: 1. No reversible ischemia or infarction.  2. Normal left ventricular wall motion.  3. Left ventricular ejection fraction 78%  4. Low-risk stress test findings*.  03/2014 Event Monitor Symptoms correlate with NSR. One episode of fluttering showed 2 PACs. No significant arrhythmias    Assessment and Plan  1. Palpitations/chest pain - negative cardiac workup including echo, stress test, and monitor.  - symptoms currently controlled on dilt and beta blocker, continue current meds    F/u 1 year    Arnoldo Lenis, M.D.

## 2014-12-14 ENCOUNTER — Telehealth: Payer: Self-pay | Admitting: Cardiology

## 2014-12-14 ENCOUNTER — Encounter: Payer: Self-pay | Admitting: *Deleted

## 2014-12-14 MED ORDER — DILTIAZEM HCL ER COATED BEADS 240 MG PO TB24: 240.0000 mg | ORAL_TABLET | Freq: Every day | ORAL | Status: DC

## 2014-12-14 NOTE — Telephone Encounter (Signed)
Spoke with patient and she is having problems with palpitations especially at night. No c/o chest pain, dizziness or sob. Patient said she has been taking an extra metoprolol at night when she feels palpitations. Cardiac medications reviewed while on the phone with patients. Patient was given an appointment to come see Dr. Harl Bowie on 12/20/14 and records from [facility] requested. Please advise.

## 2014-12-14 NOTE — Telephone Encounter (Signed)
Can increase her dilt to 240mg  daily, will reevaluate at our appointment on 12/20/14  [name] MD

## 2014-12-14 NOTE — Telephone Encounter (Signed)
Mrs. [name] [name] called stating that she is concerned about  Having irregular heart rate. States that he BP is up and down. Went to the ER at [facility] on 12/09/14.

## 2014-12-14 NOTE — Telephone Encounter (Signed)
Patient informed and verbalized understanding of plan. 

## 2014-12-20 ENCOUNTER — Ambulatory Visit: Payer: BLUE CROSS/BLUE SHIELD | Admitting: Cardiology

## 2015-01-06 ENCOUNTER — Encounter: Payer: Self-pay | Admitting: *Deleted

## 2015-01-06 ENCOUNTER — Encounter: Payer: Self-pay | Admitting: Cardiology

## 2015-01-06 ENCOUNTER — Ambulatory Visit (INDEPENDENT_AMBULATORY_CARE_PROVIDER_SITE_OTHER): Payer: BLUE CROSS/BLUE SHIELD | Admitting: Cardiology

## 2015-01-06 VITALS — BP 122/70 | HR 69 | Ht 63.0 in | Wt 203.0 lb

## 2015-01-06 DIAGNOSIS — R002 Palpitations: Secondary | ICD-10-CM

## 2015-01-06 DIAGNOSIS — G4730 Sleep apnea, unspecified: Secondary | ICD-10-CM

## 2015-01-06 MED ORDER — DILTIAZEM HCL ER COATED BEADS 240 MG PO TB24: ORAL_TABLET | ORAL | Status: DC

## 2015-01-06 NOTE — Progress Notes (Signed)
Patient ID: [name] [name], female   DOB: 1961-04-15, 54 y.o.   MRN: [id number]     Clinical Summary [name] [name] is a 54 y.o.female seen today for follow up of the following medical problems.  1. Palpitations -  admit 03/2014 with palpitations and chest pain. On that day was hypertensive and tachycardic on admission. - MPI without evidence of ischemia, LVEF 78%. Echo "normal LVEF". TSH 1.4. Event monitor with occasional PACs, no significant arrhythmias - on metoprolol and diltiazem, she is limiting caffeine intake. She is actually taking her dilt 180mg  bid.  - continues to have palpitations at night  - reports recent abdominal, awaiting results of CT scan and US done by pcp.   2. OSA screen - + snoring, + daytime somnolemence. Severe obesity by BMI, has HTN and arrhythmia  Past Medical History  Diagnosis Date  . Overweight(278.02)     Obesity  . Hypertension   . Hyperlipidemia   . Palpitations   . Chest pain     denied on 06/18/2012  . Hypoglycemia   . Uterine fibroid     pt. states that she has a "closed cervix"   . Sinusitis, acute     seen by PCP- 06/17/2012, will treat /w augmentin & tussin/DM  . H/O hiatal hernia   . Headache(784.0)     using tylenol prn  . Aneurysm     brain  . Normal cardiac stress test     pt. released fr. Dr. [name] care in 10/2011, all findings w/i normal limits  . H. pylori infection     2012- stomach biopsy  . GERD (gastroesophageal reflux disease)     h/o colitis   . Family history of anesthesia complication     brother & neice "flat lined" during induction: neice d/t a medication given for a blood clotting problem; brother d/t "miscalculated amount of anesthesia"  . Chronic kidney disease     L - upper pole- defect, doesn't effect        Allergies  Allergen Reactions  . Zithromax [Azithromycin] Anaphylaxis  . Ace Inhibitors Swelling  . Ciprofloxacin Nausea And Vomiting    Also: kidney failure   . Other Swelling, Other (See  Comments) and Cough    Sneezing allergy to cats/grass/dust  . Medrol [Methylprednisolone] Palpitations    Heart racing, increased BP  . Tetracyclines & Related Palpitations    Heart racing, increased bp     Current Outpatient Prescriptions  Medication Sig Dispense Refill  . acetaminophen (TYLENOL) 500 MG tablet Take 1,000 mg by mouth daily as needed for pain.    [name] aspirin 81 MG tablet Take 1 tablet (81 mg total) by mouth daily. 30 tablet 0  . chlorpheniramine-HYDROcodone (TUSSIONEX) 10-8 MG/5ML LQCR Take 5 mLs by mouth daily as needed for cough.   0  . Cholecalciferol (D 1000) 1000 UNITS tablet Take 1,000 Units by mouth every evening.     . diltiazem (CARDIZEM LA) 240 MG 24 hr tablet Take 1 tablet (240 mg total) by mouth daily. 30 tablet 3  . hydrALAZINE (APRESOLINE) 50 MG tablet Take 50 mg by mouth 2 (two) times daily.    . IRON PO Take 150 mg by mouth daily.    [name] loratadine (CLARITIN) 10 MG tablet Take 10 mg by mouth daily.     [name] lovastatin (MEVACOR) 20 MG tablet Take 20 mg by mouth at bedtime.    . metoprolol (LOPRESSOR) 50 MG tablet Take 50 mg by mouth 2 (two) times  daily.    . pantoprazole (PROTONIX) 40 MG tablet Take 40 mg by mouth daily.  1  . topiramate (TOPAMAX) 50 MG tablet Take 1 tablet by mouth 2 (two) times daily. Has not started taking 05/16/14     No current facility-administered medications for this visit.     Past Surgical History  Procedure Laterality Date  . Colonoscopy    . Lipoma excision      buttock 10/2010  . Craniotomy Right 06/24/2012    Procedure: CRANIOTOMY INTRACRANIAL ANEURYSM FOR CAROTID;  Surgeon: [name], MD;  Location: [place] [facility];  Service: Neurosurgery;  Laterality: Right;  RIGHT Pterional Craniotomy for aneurysm     Allergies  Allergen Reactions  . Zithromax [Azithromycin] Anaphylaxis  . Ace Inhibitors Swelling  . Ciprofloxacin Nausea And Vomiting    Also: kidney failure   . Other Swelling, Other (See Comments) and Cough     Sneezing allergy to cats/grass/dust  . Medrol [Methylprednisolone] Palpitations    Heart racing, increased BP  . Tetracyclines & Related Palpitations    Heart racing, increased bp      Family History  Problem Relation Age of Onset  . Hypertension Other   . Coronary artery disease Other      Social History [name] [name] reports that she quit smoking about 10 years ago. Her smoking use included Cigarettes. She started smoking about 25 years ago. She has a 12 pack-year smoking history. She quit smokeless tobacco use about 11 years ago. [name] [name] reports that she does not drink alcohol.   Review of Systems CONSTITUTIONAL: No weight loss, fever, chills, weakness or fatigue.  HEENT: Eyes: No visual loss, blurred vision, double vision or yellow sclerae.No hearing loss, sneezing, congestion, runny nose or sore throat.  SKIN: No rash or itching.  CARDIOVASCULAR: per HPI RESPIRATORY: No shortness of breath, cough or sputum.  GASTROINTESTINAL: No anorexia, nausea, vomiting or diarrhea. No abdominal pain or blood.  GENITOURINARY: No burning on urination, no polyuria NEUROLOGICAL: No headache, dizziness, syncope, paralysis, ataxia, numbness or tingling in the extremities. No change in bowel or bladder control.  MUSCULOSKELETAL: No muscle, back pain, joint pain or stiffness.  LYMPHATICS: No enlarged nodes. No history of splenectomy.  PSYCHIATRIC: No history of depression or anxiety.  ENDOCRINOLOGIC: No reports of sweating, cold or heat intolerance. No polyuria or polydipsia.  [name]   Physical Examination Filed Vitals:   01/06/15 1557  BP: 122/70  Pulse: 69   Filed Vitals:   01/06/15 1557  Height: 5\' 3"  (1.6 m)  Weight: 203 lb (92.08 kg)    Gen: resting comfortably, no acute distress HEENT: no scleral icterus, pupils equal round and reactive, no palptable cervical adenopathy,  CV: RRR, no m/r/g, no JVD Resp: Clear to auscultation bilaterally GI: abdomen is soft, non-tender,  non-distended, normal bowel sounds, no hepatosplenomegaly MSK: extremities are warm, no edema.  Skin: warm, no rash Neuro:  no focal deficits Psych: appropriate affect   Diagnostic Studies  03/2014 Echo Study Conclusions  - Left ventricle: The cavity size was normal. Systolic function was normal. Wall motion was normal; there were no regional wall motion abnormalities.  03/2014 Lexiscan MPI IMPRESSION: 1. No reversible ischemia or infarction.  2. Normal left ventricular wall motion.  3. Left ventricular ejection fraction 78%  4. Low-risk stress test findings*.  03/2014 Event Monitor Symptoms correlate with NSR. One episode of fluttering showed 2 PACs. No significant arrhythmias   Assessment and Plan  1. Palpitations/chest pain - negative cardiac workup  including echo, stress test, and monitor.  - palpitations mainly at night, will increase her night time dilt to 240mg  daily, continue daytime at 180mg .  2. OSA screen - signs and symptoms of OSA, will refer for sleep evaluation   F/u 3 monts      Arnoldo Lenis, M.D.

## 2015-01-06 NOTE — Patient Instructions (Signed)
Your physician recommends that you schedule a follow-up appointment in: 3 months with Dr. Harl Bowie  Your physician has recommended you make the following change in your medication:   TAKE DILTIAZEM 180 MG IN THE MORNING AND 240 MG IN THE EVENING   You have been referred to DR. HAWKINS   WE WILL REQUEST CAT SCAN AND ULTRASOUND   Thank you for choosing [facility]!!

## 2015-01-30 ENCOUNTER — Telehealth: Payer: Self-pay | Admitting: *Deleted

## 2015-01-30 NOTE — Telephone Encounter (Signed)
Nurse advised that diltiazem 240 mg could be changed to a capsule since her 180 mg is a capsule.

## 2015-02-21 ENCOUNTER — Encounter: Payer: Self-pay | Admitting: *Deleted

## 2015-02-21 ENCOUNTER — Ambulatory Visit (INDEPENDENT_AMBULATORY_CARE_PROVIDER_SITE_OTHER): Payer: BLUE CROSS/BLUE SHIELD | Admitting: Cardiology

## 2015-02-21 ENCOUNTER — Encounter: Payer: Self-pay | Admitting: Cardiology

## 2015-02-21 VITALS — BP 116/77 | HR 67 | Ht 63.0 in | Wt 209.4 lb

## 2015-02-21 DIAGNOSIS — I10 Essential (primary) hypertension: Secondary | ICD-10-CM | POA: Diagnosis not present

## 2015-02-21 DIAGNOSIS — G4730 Sleep apnea, unspecified: Secondary | ICD-10-CM

## 2015-02-21 DIAGNOSIS — R002 Palpitations: Secondary | ICD-10-CM | POA: Diagnosis not present

## 2015-02-21 MED ORDER — HYDROXYZINE HCL 25 MG PO TABS: 25.0000 mg | ORAL_TABLET | Freq: Three times a day (TID) | ORAL | Status: DC | PRN

## 2015-02-21 NOTE — Patient Instructions (Signed)
Your physician recommends that you schedule a follow-up appointment in: 4 weeks with Dr. Harl Bowie  Your physician has recommended you make the following change in your medication:   START VISTARIL 25 MG EVERY 8 HOURS AS NEEDED   Your physician recommends that you return for lab work [place] have been referred to DR. [place]   Thank you for choosing [facility]!!

## 2015-02-21 NOTE — Progress Notes (Signed)
Patient ID: [name] [name], female   DOB: [birth year], 54 y.o.   MRN: [id number]     Clinical Summary [name] [name] is a 54 y.o.female seen today for follow up of the following medical problems.   1. Palpitations - admit 03/2014 with palpitations and chest pain. On that day was hypertensive and tachycardic on admission. - MPI without evidence of ischemia, LVEF 78%. Echo "normal LVEF". TSH 1.4. Event monitor with occasional PACs, no significant arrhythmias  - continues to have symptomatic episodes of palpitaitons, high blood pressure, and headaches.    2. OSA screen - + snoring, + daytime somnolemence. Severe obesity by BMI, has HTN and arrhythmia - technical issues with sleep study, was not able to complete     Past Medical History  Diagnosis Date  . Overweight(278.02)     Obesity  . Hypertension   . Hyperlipidemia   . Palpitations   . Chest pain     denied on 06/18/2012  . Hypoglycemia   . Uterine fibroid     pt. states that she has a "closed cervix"   . Sinusitis, acute     seen by PCP- 06/17/2012, will treat /w augmentin & tussin/DM  . H/O hiatal hernia   . Headache(784.0)     using tylenol prn  . Aneurysm     brain  . Normal cardiac stress test     pt. released fr. Dr. [name] care in 10/2011, all findings w/i normal limits  . H. pylori infection     2012- stomach biopsy  . GERD (gastroesophageal reflux disease)     h/o colitis   . Family history of anesthesia complication     brother & neice "flat lined" during induction: neice d/t a medication given for a blood clotting problem; brother d/t "miscalculated amount of anesthesia"  . Chronic kidney disease     L - upper pole- defect, doesn't effect        Allergies  Allergen Reactions  . Zithromax [Azithromycin] Anaphylaxis  . Ace Inhibitors Swelling  . Ciprofloxacin Nausea And Vomiting    Also: kidney failure   . Other Swelling, Other (See Comments) and Cough    Sneezing allergy to cats/grass/dust  .  Medrol [Methylprednisolone] Palpitations    Heart racing, increased BP  . Tetracyclines & Related Palpitations    Heart racing, increased bp     Current Outpatient Prescriptions  Medication Sig Dispense Refill  . acetaminophen (TYLENOL) 500 MG tablet Take 1,000 mg by mouth daily as needed for pain.    [name] aspirin 81 MG tablet Take 1 tablet (81 mg total) by mouth daily. 30 tablet 0  . Cholecalciferol (D 1000) 1000 UNITS tablet Take 1,000 Units by mouth every evening.     . diltiazem (CARDIZEM LA) 240 MG 24 hr tablet Take 180 mg in the morning and 240 mg in the evening 180 tablet 3  . hydrALAZINE (APRESOLINE) 50 MG tablet Take 50 mg by mouth 2 (two) times daily.    . IRON PO Take 150 mg by mouth daily.    [name] loratadine (CLARITIN) 10 MG tablet Take 10 mg by mouth daily.     . metoprolol (LOPRESSOR) 50 MG tablet Take 50 mg by mouth 2 (two) times daily.    . pantoprazole (PROTONIX) 40 MG tablet Take 40 mg by mouth daily.  1  . topiramate (TOPAMAX) 50 MG tablet Take 1 tablet by mouth 2 (two) times daily. Has not started taking 05/16/14     No current  facility-administered medications for this visit.     Past Surgical History  Procedure Laterality Date  . Colonoscopy    . Lipoma excision      buttock 10/2010  . Craniotomy Right 06/24/2012    Procedure: CRANIOTOMY INTRACRANIAL ANEURYSM FOR CAROTID;  Surgeon: [name], MD;  Location: [facility];  Service: Neurosurgery;  Laterality: Right;  RIGHT Pterional Craniotomy for aneurysm     Allergies  Allergen Reactions  . Zithromax [Azithromycin] Anaphylaxis  . Ace Inhibitors Swelling  . Ciprofloxacin Nausea And Vomiting    Also: kidney failure   . Other Swelling, Other (See Comments) and Cough    Sneezing allergy to cats/grass/dust  . Medrol [Methylprednisolone] Palpitations    Heart racing, increased BP  . Tetracyclines & Related Palpitations    Heart racing, increased bp      Family History  Problem Relation Age of Onset  .  Hypertension Other   . Coronary artery disease Other      Social History [name] [name] reports that she quit smoking about 10 years ago. Her smoking use included Cigarettes. She started smoking about 26 years ago. She has a 12 pack-year smoking history. She quit smokeless tobacco use about 11 years ago. [name] [name] reports that she does not drink alcohol.   Review of Systems CONSTITUTIONAL: No weight loss, fever, chills, weakness or fatigue.  HEENT: Eyes: No visual loss, blurred vision, double vision or yellow sclerae.No hearing loss, sneezing, congestion, runny nose or sore throat.  SKIN: No rash or itching.  CARDIOVASCULAR: per hpi RESPIRATORY: No shortness of breath, cough or sputum.  GASTROINTESTINAL: No anorexia, nausea, vomiting or diarrhea. No abdominal pain or blood.  GENITOURINARY: No burning on urination, no polyuria NEUROLOGICAL: No headache, dizziness, syncope, paralysis, ataxia, numbness or tingling in the extremities. No change in bowel or bladder control.  MUSCULOSKELETAL: No muscle, back pain, joint pain or stiffness.  LYMPHATICS: No enlarged nodes. No history of splenectomy.  PSYCHIATRIC: No history of depression or anxiety.  ENDOCRINOLOGIC: No reports of sweating, cold or heat intolerance. No polyuria or polydipsia.  [name]   Physical Examination Filed Vitals:   02/21/15 1631  BP: 116/77  Pulse: 67   Filed Vitals:   02/21/15 1631  Height: 5\' 3"  (1.6 m)  Weight: 209 lb 6.4 oz (94.983 kg)    Gen: resting comfortably, no acute distress HEENT: no scleral icterus, pupils equal round and reactive, no palptable cervical adenopathy,  CV: RRR, no m/r/g, no jvd Resp: Clear to auscultation bilaterally GI: abdomen is soft, non-tender, non-distended, normal bowel sounds, no hepatosplenomegaly MSK: extremities are warm, no edema.  Skin: warm, no rash Neuro:  no focal deficits Psych: appropriate affect   Diagnostic Studies 03/2014 Echo Study Conclusions  - Left  ventricle: The cavity size was normal. Systolic function was normal. Wall motion was normal; there were no regional wall motion abnormalities.  03/2014 Lexiscan MPI IMPRESSION: 1. No reversible ischemia or infarction.  2. Normal left ventricular wall motion.  3. Left ventricular ejection fraction 78%  4. Low-risk stress test findings*.  03/2014 Event Monitor Symptoms correlate with NSR. One episode of fluttering showed 2 PACs. No significant arrhythmias    Assessment and Plan    1. Palpitations/chest pain - negative cardiac workup including echo, stress test, and monitor.  - continues to have episodes of palpitaitons, HTN, and headaches. Will check 24 urine catecholamines/metanephrines to eval for possible pheochromacytoma.   2. OSA screen Will refer to Dr Luan Pulling for evaluation for sleep  apnea    Arnoldo Lenis, M.D

## 2015-03-09 ENCOUNTER — Encounter: Payer: Self-pay | Admitting: *Deleted

## 2015-03-21 ENCOUNTER — Telehealth: Payer: Self-pay | Admitting: *Deleted

## 2015-03-21 ENCOUNTER — Encounter: Payer: Self-pay | Admitting: *Deleted

## 2015-03-21 NOTE — Telephone Encounter (Signed)
Opened in Error.

## 2015-03-28 ENCOUNTER — Other Ambulatory Visit (HOSPITAL_COMMUNITY): Payer: Self-pay | Admitting: Respiratory Therapy

## 2015-03-28 DIAGNOSIS — G4730 Sleep apnea, unspecified: Secondary | ICD-10-CM

## 2015-04-07 ENCOUNTER — Encounter: Payer: Self-pay | Admitting: Cardiology

## 2015-04-07 ENCOUNTER — Ambulatory Visit: Payer: BLUE CROSS/BLUE SHIELD | Admitting: Cardiology

## 2015-04-07 ENCOUNTER — Ambulatory Visit (INDEPENDENT_AMBULATORY_CARE_PROVIDER_SITE_OTHER): Payer: BLUE CROSS/BLUE SHIELD | Admitting: Cardiology

## 2015-04-07 VITALS — BP 108/73 | HR 60 | Ht 63.0 in | Wt 208.0 lb

## 2015-04-07 DIAGNOSIS — R002 Palpitations: Secondary | ICD-10-CM | POA: Diagnosis not present

## 2015-04-07 NOTE — Patient Instructions (Signed)
   Please do urine studies as requested at previous office visit.  Orders provided again today. Continue all current medications. Your physician wants you to follow up in: 6 months.  You will receive a reminder letter in the mail one-two months in advance.  If you don't receive a letter, please call our office to schedule the follow up appointment

## 2015-04-07 NOTE — Progress Notes (Signed)
Patient ID: [name] [name], female   DOB: 26-Dec-1960, 54 y.o.   MRN: [id number]     Clinical Summary [name] [name] is a 54 y.o.female seen today for follow up of the following medical problems. This is a focused visit on palpitations.   1. Palpitations - admit 03/2014 with palpitations and chest pain. On that day was hypertensive and tachycardic on admission. - MPI without evidence of ischemia, LVEF 78%. Echo "normal LVEF". TSH 1.4. Event monitor with occasional PACs, no significant arrhythmias  - continues to have symptomatic episodes of palpitaitons, high blood pressure, and headaches.  - she has not had her urine studies for pheochromacytoma completed yet    Past Medical History  Diagnosis Date  . Overweight(278.02)     Obesity  . Hypertension   . Hyperlipidemia   . Palpitations   . Chest pain     denied on 06/18/2012  . Hypoglycemia   . Uterine fibroid     pt. states that she has a "closed cervix"   . Sinusitis, acute     seen by PCP- 06/17/2012, will treat /w augmentin & tussin/DM  . H/O hiatal hernia   . Headache(784.0)     using tylenol prn  . Aneurysm ([place])     brain  . Normal cardiac stress test     pt. released fr. Dr. [name] care in 10/2011, all findings w/i normal limits  . H. pylori infection     2012- stomach biopsy  . GERD (gastroesophageal reflux disease)     h/o colitis   . Family history of anesthesia complication     brother & neice "flat lined" during induction: neice d/t a medication given for a blood clotting problem; brother d/t "miscalculated amount of anesthesia"  . Chronic kidney disease     L - upper pole- defect, doesn't effect        Allergies  Allergen Reactions  . Zithromax [Azithromycin] Anaphylaxis  . Ace Inhibitors Swelling  . Ciprofloxacin Nausea And Vomiting    Also: kidney failure   . Other Swelling, Other (See Comments) and Cough    Sneezing allergy to cats/grass/dust  . Medrol [Methylprednisolone] Palpitations    Heart  racing, increased BP  . Tetracyclines & Related Palpitations    Heart racing, increased bp     Current Outpatient Prescriptions  Medication Sig Dispense Refill  . acetaminophen (TYLENOL) 650 MG CR tablet Take 650 mg by mouth daily.    [name] aspirin 81 MG tablet Take 1 tablet (81 mg total) by mouth daily. 30 tablet 0  . Cholecalciferol (D 1000) 1000 UNITS tablet Take 1,000 Units by mouth every evening.     . diltiazem (CARDIZEM LA) 240 MG 24 hr tablet Take 180 mg in the morning and 240 mg in the evening 180 tablet 3  . hydrALAZINE (APRESOLINE) 50 MG tablet Take 50 mg by mouth 2 (two) times daily.    . hydrOXYzine (ATARAX/VISTARIL) 25 MG tablet Take 1 tablet (25 mg total) by mouth every 8 (eight) hours as needed. 30 tablet 0  . IRON PO Take 150 mg by mouth daily.    [name] loratadine (CLARITIN) 10 MG tablet Take 10 mg by mouth daily.     . metoprolol (LOPRESSOR) 50 MG tablet Take 50 mg by mouth 2 (two) times daily.    . montelukast (SINGULAIR) 10 MG tablet Take 1 tablet by mouth daily.  1  . pantoprazole (PROTONIX) 40 MG tablet Take 40 mg by mouth daily.  1  No current facility-administered medications for this visit.     Past Surgical History  Procedure Laterality Date  . Colonoscopy    . Lipoma excision      buttock 10/2010  . Craniotomy Right 06/24/2012    Procedure: CRANIOTOMY INTRACRANIAL ANEURYSM FOR CAROTID;  Surgeon: [name], MD;  Location: [facility];  Service: Neurosurgery;  Laterality: Right;  RIGHT Pterional Craniotomy for aneurysm     Allergies  Allergen Reactions  . Zithromax [Azithromycin] Anaphylaxis  . Ace Inhibitors Swelling  . Ciprofloxacin Nausea And Vomiting    Also: kidney failure   . Other Swelling, Other (See Comments) and Cough    Sneezing allergy to cats/grass/dust  . Medrol [Methylprednisolone] Palpitations    Heart racing, increased BP  . Tetracyclines & Related Palpitations    Heart racing, increased bp      Family History  Problem  Relation Age of Onset  . Hypertension Other   . Coronary artery disease Other      Social History [name] [name] reports that she quit smoking about 10 years ago. Her smoking use included Cigarettes. She started smoking about 26 years ago. She has a 12 pack-year smoking history. She quit smokeless tobacco use about 11 years ago. [name] reports that she does not drink alcohol.   Review of Systems CONSTITUTIONAL: No weight loss, fever, chills, weakness or fatigue.  HEENT: Eyes: No visual loss, blurred vision, double vision or yellow sclerae.No hearing loss, sneezing, congestion, runny nose or sore throat.  SKIN: No rash or itching.  CARDIOVASCULAR: per hpi RESPIRATORY: No shortness of breath, cough or sputum.  GASTROINTESTINAL: No anorexia, nausea, vomiting or diarrhea. No abdominal pain or blood.  GENITOURINARY: No burning on urination, no polyuria NEUROLOGICAL: No headache, dizziness, syncope, paralysis, ataxia, numbness or tingling in the extremities. No change in bowel or bladder control.  MUSCULOSKELETAL: No muscle, back pain, joint pain or stiffness.  LYMPHATICS: No enlarged nodes. No history of splenectomy.  PSYCHIATRIC: No history of depression or anxiety.  ENDOCRINOLOGIC: No reports of sweating, cold or heat intolerance. No polyuria or polydipsia.  [name]   Physical Examination Filed Vitals:   04/07/15 1310  BP: 108/73  Pulse: 60   Filed Vitals:   04/07/15 1310  Height: 5\' 3"  (1.6 m)  Weight: 208 lb (94.348 kg)    Gen: resting comfortably, no acute distress HEENT: no scleral icterus, pupils equal round and reactive, no palptable cervical adenopathy,  CV: RRR, no m/r/g, no jvd Resp: Clear to auscultation bilaterally GI: abdomen is soft, non-tender, non-distended, normal bowel sounds, no hepatosplenomegaly MSK: extremities are warm, no edema.  Skin: warm, no rash Neuro:  no focal deficits Psych: appropriate affect   Diagnostic Studies  05/2009 Nuclear  stress No ischemia  03/2014 Echo Study Conclusions  - Left ventricle: The cavity size was normal. Systolic function was normal. Wall motion was normal; there were no regional wall motion abnormalities.  03/2014 Lexiscan MPI IMPRESSION: 1. No reversible ischemia or infarction.  2. Normal left ventricular wall motion.  3. Left ventricular ejection fraction 78%  4. Low-risk stress test findings*.  03/2014 Event Monitor Symptoms correlate with NSR. One episode of fluttering showed 2 PACs. No significant arrhythmias   Assessment and Plan  1. Palpitations/chest pain - negative cardiac workup including echo, stress test, and monitor.  - continues to have episodes of palpitaitons, HTN, and headaches. Will reorder urine catecholamines.   F/u 6 months      Arnoldo Lenis, M.D.

## 2015-08-05 ENCOUNTER — Emergency Department (HOSPITAL_COMMUNITY): Payer: BLUE CROSS/BLUE SHIELD

## 2015-08-05 ENCOUNTER — Encounter (HOSPITAL_COMMUNITY): Payer: Self-pay | Admitting: Emergency Medicine

## 2015-08-05 ENCOUNTER — Emergency Department (HOSPITAL_COMMUNITY)
Admission: EM | Admit: 2015-08-05 | Discharge: 2015-08-05 | Disposition: A | Discharge status: Home / Self Care | Payer: BLUE CROSS/BLUE SHIELD | Attending: Emergency Medicine | Admitting: Emergency Medicine

## 2015-08-05 DIAGNOSIS — Z79899 Other long term (current) drug therapy: Secondary | ICD-10-CM | POA: Diagnosis not present

## 2015-08-05 DIAGNOSIS — I129 Hypertensive chronic kidney disease with stage 1 through stage 4 chronic kidney disease, or unspecified chronic kidney disease: Secondary | ICD-10-CM | POA: Insufficient documentation

## 2015-08-05 DIAGNOSIS — N189 Chronic kidney disease, unspecified: Secondary | ICD-10-CM | POA: Diagnosis not present

## 2015-08-05 DIAGNOSIS — Z7982 Long term (current) use of aspirin: Secondary | ICD-10-CM | POA: Insufficient documentation

## 2015-08-05 DIAGNOSIS — R079 Chest pain, unspecified: Secondary | ICD-10-CM

## 2015-08-05 DIAGNOSIS — R0789 Other chest pain: Secondary | ICD-10-CM | POA: Diagnosis not present

## 2015-08-05 DIAGNOSIS — E785 Hyperlipidemia, unspecified: Secondary | ICD-10-CM | POA: Insufficient documentation

## 2015-08-05 DIAGNOSIS — Z87891 Personal history of nicotine dependence: Secondary | ICD-10-CM | POA: Diagnosis not present

## 2015-08-05 DIAGNOSIS — R0782 Intercostal pain: Secondary | ICD-10-CM | POA: Diagnosis not present

## 2015-08-05 LAB — CBC WITH DIFFERENTIAL/PLATELET
Basophils Absolute: 0 K/uL (ref 0.0–0.1)
Basophils Relative: 0 %
Eosinophils Absolute: 0.1 K/uL (ref 0.0–0.7)
Eosinophils Relative: 1 %
HCT: 35.9 % — ABNORMAL LOW (ref 36.0–46.0)
Hemoglobin: 11.8 g/dL — ABNORMAL LOW (ref 12.0–15.0)
Lymphocytes Relative: 25 %
Lymphs Abs: 2.5 K/uL (ref 0.7–4.0)
MCH: 29.4 pg (ref 26.0–34.0)
MCHC: 32.9 g/dL (ref 30.0–36.0)
MCV: 89.5 fL (ref 78.0–100.0)
Monocytes Absolute: 0.5 K/uL (ref 0.1–1.0)
Monocytes Relative: 5 %
Neutro Abs: 6.8 K/uL (ref 1.7–7.7)
Neutrophils Relative %: 69 %
Platelets: 355 K/uL (ref 150–400)
RBC: 4.01 MIL/uL (ref 3.87–5.11)
RDW: 13.3 % (ref 11.5–15.5)
WBC: 9.9 K/uL (ref 4.0–10.5)

## 2015-08-05 LAB — TROPONIN I: Troponin I: <0.03 ng/mL (ref <0.031)

## 2015-08-05 LAB — BASIC METABOLIC PANEL WITH GFR
Anion gap: 8 (ref 5–15)
BUN: 25 mg/dL — ABNORMAL HIGH (ref 6–20)
CO2: 24 mmol/L (ref 22–32)
Calcium: 9.1 mg/dL (ref 8.9–10.3)
Chloride: 108 mmol/L (ref 101–111)
Creatinine, Ser: 1.28 mg/dL — ABNORMAL HIGH (ref 0.44–1.00)
GFR calc Af Amer: 54 mL/min — ABNORMAL LOW
GFR calc non Af Amer: 47 mL/min — ABNORMAL LOW
Glucose, Bld: 96 mg/dL (ref 65–99)
Potassium: 3.9 mmol/L (ref 3.5–5.1)
Sodium: 140 mmol/L (ref 135–145)

## 2015-08-05 MED ORDER — OXYCODONE-ACETAMINOPHEN 5-325 MG PO TABS
1.0000 | ORAL_TABLET | Freq: Once | ORAL | Status: AC
  Administered 2015-08-05: 1 via ORAL
  Filled 2015-08-05: qty 1

## 2015-08-05 NOTE — ED Notes (Signed)
Cp x 3 days

## 2015-08-05 NOTE — Discharge Instructions (Signed)

## 2015-08-07 ENCOUNTER — Telehealth: Payer: Self-pay | Admitting: Cardiology

## 2015-08-07 NOTE — Telephone Encounter (Signed)
Apt made for 3/13 at 3:30 pm

## 2015-08-07 NOTE — Telephone Encounter (Signed)
Patient was in the ER over the weekend with abnormal EKG.  She was told to make an appointment with her cardiologist asap (Dr. Harl Bowie).  I don't see any appointments available till June.

## 2015-08-10 ENCOUNTER — Ambulatory Visit (INDEPENDENT_AMBULATORY_CARE_PROVIDER_SITE_OTHER): Payer: BLUE CROSS/BLUE SHIELD | Admitting: Adult Health

## 2015-08-10 ENCOUNTER — Encounter: Payer: Self-pay | Admitting: Adult Health

## 2015-08-10 ENCOUNTER — Encounter: Payer: Self-pay | Admitting: *Deleted

## 2015-08-10 VITALS — BP 112/72 | HR 72 | Ht 62.0 in | Wt 201.0 lb

## 2015-08-10 DIAGNOSIS — R42 Dizziness and giddiness: Secondary | ICD-10-CM | POA: Diagnosis not present

## 2015-08-10 DIAGNOSIS — R072 Precordial pain: Secondary | ICD-10-CM

## 2015-08-10 MED ORDER — MECLIZINE HCL 25 MG PO TABS: ORAL_TABLET | ORAL | Status: DC

## 2015-08-10 NOTE — Progress Notes (Deleted)
Name: [name] [name]    DOB: 01-Jun-1960  Age: 55 y.o.  MR#: [id number]       PCP:  Gar Ponto, MD      Insurance: Payor: [place] / Plan: BCBS OTHER / Product Type: *No Product type* /   CC:   No chief complaint on file.   VS Filed Vitals:   08/10/15 1539  BP: 112/72  Pulse: 72  Height: 5\' 2"  (1.575 m)  Weight: 201 lb (91.173 kg)  SpO2: 94%    Weights Current Weight  08/10/15 201 lb (91.173 kg)  08/05/15 199 lb (90.266 kg)  04/07/15 208 lb (94.348 kg)    Blood Pressure  BP Readings from Last 3 Encounters:  08/10/15 112/72  08/05/15 103/63  04/07/15 108/73     Admit date:  (Not on file) Last encounter with RMR:  Visit date not found   Allergy Zithromax; Ace inhibitors; Ciprofloxacin; Other; Medrol; and Tetracyclines & related  Current Outpatient Prescriptions  Medication Sig Dispense Refill  . acetaminophen (TYLENOL) 650 MG CR tablet Take 1,300 mg by mouth every 8 (eight) hours as needed for pain or fever.     [name] aspirin 81 MG tablet Take 1 tablet (81 mg total) by mouth daily. 30 tablet 0  . Cholecalciferol (D 1000) 1000 UNITS tablet Take 1,000 Units by mouth every evening.     . diltiazem (CARDIZEM LA) 240 MG 24 hr tablet Take 180 mg in the morning and 240 mg in the evening (Patient taking differently: Take 180-240 mg by mouth 2 (two) times daily. Take 180 mg in the morning and 240 mg in the evening) 180 tablet 3  . FERREX 150 150 MG capsule Take 1 capsule by mouth daily. Takes it daily but prescribed twice daily  3  . hydrALAZINE (APRESOLINE) 50 MG tablet Take 50 mg by mouth 2 (two) times daily.    [name] loratadine (CLARITIN) 10 MG tablet Take 10 mg by mouth daily.     . metoprolol (LOPRESSOR) 50 MG tablet Take 50 mg by mouth 2 (two) times daily.    . montelukast (SINGULAIR) 10 MG tablet Take 1 tablet by mouth daily.  1  . pantoprazole (PROTONIX) 40 MG tablet Take 40 mg by mouth daily.  1  . PROAIR HFA 108 (90 Base) MCG/ACT inhaler INHALE 2 PUFF EVERY 4  HOURS AS NEEDED  1   No current facility-administered medications for this visit.    Discontinued Meds:    Medications Discontinued During This Encounter  Medication Reason  . hydrOXYzine (ATARAX/VISTARIL) 25 MG tablet Error    Patient Active Problem List   Diagnosis Date Noted  . Chest tightness or pressure 04/03/2014  . Dizziness 04/03/2014  . Headache 04/03/2014  . Chest pain 04/02/2014  . GERD (gastroesophageal reflux disease) 04/02/2014  . Meningitis, unspecified(322.9) 08/11/2012  . 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 HLD (hyperlipidemia) 01/05/2009  . OVERWEIGHT/OBESITY 01/05/2009  . Essential hypertension 01/05/2009  . Palpitations 01/05/2009  . CHEST PAIN-UNSPECIFIED 01/05/2009    LABS    Component Value Date/Time   NA 140 08/05/2015 1931   NA 142 04/03/2014 0518   NA 139 08/30/2012 1355   K 3.9 08/05/2015 1931   K 3.9 04/03/2014 0518   K 3.4* 08/30/2012 1355   CL 108 08/05/2015 1931   CL 105 04/03/2014 0518   CL 104 08/30/2012 1355   CO2 24 08/05/2015 1931   CO2 25 04/03/2014 0518   CO2 24 08/30/2012 1355   GLUCOSE  96 08/05/2015 1931   GLUCOSE 85 04/03/2014 0518   GLUCOSE 96 08/30/2012 1355   BUN 25* 08/05/2015 1931   BUN 15 04/03/2014 0518   BUN 20 08/30/2012 1355   CREATININE 1.28* 08/05/2015 1931   CREATININE 1.01 04/03/2014 0518   CREATININE 1.25* 08/30/2012 1355   CALCIUM 9.1 08/05/2015 1931   CALCIUM 9.8 04/03/2014 0518   CALCIUM 8.4 08/30/2012 1355   GFRNONAA 47* 08/05/2015 1931   GFRNONAA 62* 04/03/2014 0518   GFRNONAA 49* 08/30/2012 1355   GFRAA 54* 08/05/2015 1931   GFRAA 72* 04/03/2014 0518   GFRAA 57* 08/30/2012 1355   CMP     Component Value Date/Time   NA 140 08/05/2015 1931   K 3.9 08/05/2015 1931   CL 108 08/05/2015 1931   CO2 24 08/05/2015 1931   GLUCOSE 96 08/05/2015 1931   BUN 25* 08/05/2015 1931   CREATININE 1.28* 08/05/2015 1931   CALCIUM 9.1 08/05/2015 1931   PROT 6.9 04/03/2014 0518   ALBUMIN 3.5  04/03/2014 0518   AST 14 04/03/2014 0518   ALT 8 04/03/2014 0518   ALKPHOS 64 04/03/2014 0518   BILITOT 0.5 04/03/2014 0518   GFRNONAA 47* 08/05/2015 1931   GFRAA 54* 08/05/2015 1931       Component Value Date/Time   WBC 9.9 08/05/2015 1931   WBC 7.5 04/03/2014 0518   WBC 16.6* 08/30/2012 1320   HGB 11.8* 08/05/2015 1931   HGB 11.3* 04/03/2014 0518   HGB 11.3* 08/30/2012 1320   HCT 35.9* 08/05/2015 1931   HCT 35.6* 04/03/2014 0518   HCT 32.9* 08/30/2012 1320   MCV 89.5 08/05/2015 1931   MCV 87.5 04/03/2014 0518   MCV 84.1 08/30/2012 1320    Lipid Panel     Component Value Date/Time   CHOL 179 04/03/2014 0518   TRIG 51 04/03/2014 0518   HDL 55 04/03/2014 0518   CHOLHDL 3.3 04/03/2014 0518   VLDL 10 04/03/2014 0518   LDLCALC 114* 04/03/2014 0518    ABG No results found for: PHART, PCO2ART, PO2ART, HCO3, TCO2, ACIDBASEDEF, O2SAT   Lab Results  Component Value Date   TSH 1.410 04/03/2014   BNP (last 3 results) No results for input(s): BNP in the last 8760 hours.  ProBNP (last 3 results) No results for input(s): PROBNP in the last 8760 hours.  Cardiac Panel (last 3 results) No results for input(s): CKTOTAL, CKMB, TROPONINI, RELINDX in the last 72 hours.  Iron/TIBC/Ferritin/ %Sat    Component Value Date/Time   IRON 135 08/19/2012 2011   TIBC 284 08/19/2012 2011   IRONPCTSAT 48 08/19/2012 2011     EKG Orders placed or performed during the hospital encounter of 08/05/15  . EKG 12-Lead  . EKG 12-Lead  . EKG     Prior Assessment and Plan Problem List as of 08/10/2015      Cardiovascular and Mediastinum   Essential hypertension   Last Assessment & Plan 01/01/2011 Office Visit Written 01/01/2011  6:15 PM by Ezra Sites, MD    Blood pressure is well controlled. The patient was given a refill on hydrochlorothiazide/spironolactone      Aneurysm, cerebral, nonruptured     Digestive   GERD (gastroesophageal reflux disease)     Nervous and Auditory    Meningitis, unspecified(322.9)     Other   HLD (hyperlipidemia)   Last Assessment & Plan 12/11/2011 Office Visit Written 12/11/2011  7:07 PM by Ezra Sites, MD    Followup with primary care  physician. Stable      OVERWEIGHT/OBESITY   Palpitations   Last Assessment & Plan 12/11/2011 Office Visit Written 12/11/2011  7:08 PM by Ezra Sites, MD    No recurrent palpitations no further workup needed.      CHEST PAIN-UNSPECIFIED   Last Assessment & Plan 12/11/2011 Office Visit Written 12/11/2011  7:07 PM by Ezra Sites, MD    No recurrent chest pain. No further ischemia workup      Chest pain   Chest tightness or pressure   Dizziness   Headache       Imaging: Dg Chest 2 View  08/05/2015  CLINICAL DATA:  Chest pain, recent admission for costochondritis per patient. EXAM: CHEST  2 VIEW COMPARISON:  Chest x-rays dated 06/05/2015 and 03/29/2015. FINDINGS: Heart size is normal. Overall cardiomediastinal silhouette is stable in size and configuration. Lungs are clear. Lung volumes are normal. No evidence of pneumonia. No pleural effusion or pneumothorax seen. Osseous and soft tissue structures about the chest are unremarkable. IMPRESSION: Lungs are clear and there is no evidence of acute cardiopulmonary abnormality. Electronically Signed   By: Franki Cabot M.D.   On: 08/05/2015 19:10

## 2015-08-10 NOTE — Progress Notes (Signed)
Cardiology Office Note   Date:  08/10/2015   ID:  Lexie Dampier, DOB [birth year], MRN [id number]  PCP:  Gar Ponto, MD  Cardiologist:  Cloria Spring, NP   No chief complaint on file.     History of Present Illness: [name] [name] is a 55 y.o. female who presents for ongoing assessment and management of palpitations with history of hypertension, hyperlipidemia, morbid obesity, and GERD.the patient had a low risk Myoview completed on 1220 2015, revealing no reversible ischemia or infarction, normal left ventricular wall motion, LVEF of 70% and was found to be low risk.  An event monitor was placed in December of 2015, which correlated with normal sinus rhythm with rare PAC.  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  She is here for followup and reassessment.  Into the emergency room at [facility] with complaints of chest pain.  The pain is reproducible with palpation of the left side of her chest under her left breast and sternum.  She also has pain, which is reproducible by left upper quadrant, she states she feels when she is lying down, but getting up and walking around helps make it better.  She did followup and have an EKG completed at a urgent care and do to abnormal reading was sent to the ER.  EKG revealed possible anterior septal wall infarct.  No acute ST-T wave changes.  As a result of this.  I asked her to followup with cardiology.  She continues to have chest pain.  Also has had a history of meningitis and has had some nerve pain.  She also has had evidence of fibromyalgia and has been seen by her primary care, but has not been taking any medication to control.  This.  She wants to rule out cardiac etiology.  Past Medical History  Diagnosis Date  . Overweight(278.02)     Obesity  . Hypertension   . Hyperlipidemia   . Palpitations   . Chest pain     denied on  06/18/2012  . Hypoglycemia   . Uterine fibroid     pt. states that she has a "closed cervix"   . Sinusitis, acute     seen by PCP- 06/17/2012, will treat /w augmentin & tussin/DM  . H/O hiatal hernia   . Headache(784.0)     using tylenol prn  . 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 Normal cardiac stress test     pt. released fr. Dr. [name] care in 10/2011, all findings w/i normal limits  . H. pylori infection     2012- stomach biopsy  . GERD (gastroesophageal reflux disease)     h/o colitis   . Family history of anesthesia complication     brother & neice "flat lined" during induction: neice d/t a medication given for a blood clotting problem; brother d/t "miscalculated amount of anesthesia"  . Chronic kidney disease     L - upper pole- defect, doesn't effect       Past Surgical History  Procedure Laterality Date  . Colonoscopy    . Lipoma excision      buttock 10/2010  . Craniotomy Right 06/24/2012    Procedure: CRANIOTOMY INTRACRANIAL ANEURYSM FOR CAROTID;  Surgeon: [name], MD;  Location: [facility];  Service: Neurosurgery;  Laterality: Right;  RIGHT Pterional Craniotomy for aneurysm     Current Outpatient Prescriptions  Medication Sig Dispense Refill  . acetaminophen (TYLENOL) 650 MG CR tablet Take 1,300 mg by mouth every 8 (eight) hours as needed for pain or fever.     [name] aspirin 81 MG tablet Take 1 tablet (81 mg total) by mouth daily. 30 tablet 0  . Cholecalciferol (D 1000) 1000 UNITS tablet Take 1,000 Units by mouth every evening.     . diltiazem (CARDIZEM LA) 240 MG 24 hr tablet Take 180 mg in the morning and 240 mg in the evening (Patient taking differently: Take 180-240 mg by mouth 2 (two) times daily. Take 180 mg in the morning and 240 mg in the evening) 180 tablet 3  . FERREX 150 150 MG capsule Take 1 capsule by mouth daily. Takes it daily but prescribed twice daily  3  . hydrALAZINE (APRESOLINE) 50 MG tablet Take 50 mg by mouth 2 (two) times daily.    [name] loratadine  (CLARITIN) 10 MG tablet Take 10 mg by mouth daily.     . metoprolol (LOPRESSOR) 50 MG tablet Take 50 mg by mouth 2 (two) times daily.    . montelukast (SINGULAIR) 10 MG tablet Take 1 tablet by mouth daily.  1  . pantoprazole (PROTONIX) 40 MG tablet Take 40 mg by mouth daily.  1  . PROAIR HFA 108 (90 Base) MCG/ACT inhaler INHALE 2 PUFF EVERY 4 HOURS AS NEEDED  1   No current facility-administered medications for this visit.    Allergies:   Zithromax; Ace inhibitors; Ciprofloxacin; Other; Medrol; and Tetracyclines & related    Social History:  The patient  reports that she quit smoking about 11 years ago. Her smoking use included Cigarettes. She started smoking about 26 years ago. She has a 12 pack-year smoking history. She quit smokeless tobacco use about 12 years ago. She reports that she does not drink alcohol or use illicit drugs.   Family History:  The patient's family history includes Coronary artery disease in her other; Hypertension in her other.    ROS: All other systems are reviewed and negative. Unless otherwise mentioned in H&P    PHYSICAL EXAM: VS:  BP 112/72 mmHg  Pulse 72  Ht 5\' 2"  (1.575 m)  Wt 201 lb (91.173 kg)  BMI 36.75 kg/m2  SpO2 94%  LMP 08/25/2012 , BMI Body mass index is 36.75 kg/(m^2). GEN: Well nourished, well developed, in no acute distress HEENT: normal Neck: no JVD, carotid bruits, or masses Cardiac: RRR; no murmurs, rubs, or gallops,no edema  Respiratory:  clear to auscultation bilaterally, normal work of breathing GI: soft, nontender, nondistended, + BS MS: no deformity or atrophy pain is reproducible by palpation of the sternum and under left breast also right upper quadrant and lower back. Skin: warm and dry, no rash Neuro:  Strength and sensation are intact Psych: euthymic mood, full affect   EKG:  The ekg ordered today demonstrates normal sinus rhythm with Q waves noted anteroseptal.   Recent Labs: 08/05/2015: BUN 25*; Creatinine, Ser  1.28*; Hemoglobin 11.8*; Platelets 355; Potassium 3.9; Sodium 140    Lipid Panel    Component Value Date/Time   CHOL 179 04/03/2014 0518   TRIG 51 04/03/2014 0518   HDL 55 04/03/2014 0518   CHOLHDL 3.3 04/03/2014 0518   VLDL 10 04/03/2014 0518   LDLCALC 114* 04/03/2014 0518      Wt Readings from Last 3 Encounters:  08/10/15 201 lb (91.173 kg)  08/05/15 199 lb (90.266 kg)  04/07/15 208 lb (94.348 kg)  ASSESSMENT AND PLAN:  1. Chest pain: Typical and atypical features, abnormal EKG with nonspecific changes anteroseptal.  She did have a stress test, which was negative for reversible ischemia approximately 2 years ago.  The patient continues to have chest pain, which awakens her at night.  Echocardiogram was normal in 2015 as well. We will repeat stress test, and do a LexiScan Cardiolite two-day study, as she has abdominal obesity for better evaluation and penetration. Doubt cardiac etiology, but will check for reevaluation. If she rules out for ischemia, would refer her back to her primary care for continued evaluation of possible spinal abnormalities, disc disease, and also to her neurologist for nerve damage, versus irritability.  I have not changed any medications at this time.  2. Hypertension:blood pressure is low normal.  She is on diltiazem 240 mg twice a day, 180 mg in the morning to 40 in the evening.  She also is on hydralazine 50 mg twice a day, metoprolol 50 mg twice a day.  Heart rate is normal.  He may need to consider decreasing diltiazem.  She was not found to be orthostatic.  3. History of meningitis:she is referred to her primary care physician, and neurologist for further evaluation and recommendation. Current medicines are reviewed at length with the patient today.    Labs/ tests ordered today include: 2-day  LexiScan Cardiolite  No orders of the defined types were placed in this encounter.     Disposition:   FU with Dr. Harl Bowie in [place], Jory Sims, NP  08/10/2015 3:53 PM    [facility]. [address], [place], [place] [address] Phone: [phone]; Fax: [phone]

## 2015-08-10 NOTE — Patient Instructions (Addendum)
Your physician recommends that you schedule a follow-up appointment after Stress Test.   Your physician has requested that you have a lexiscan myoview. For further information please visit [email or web address]. Please follow instruction sheet, as given.  Your physician has recommended you make the following change in your medication:  Start Meclizine 12.5 mg   If you need a refill on your cardiac medications before your next appointment, please call your pharmacy.  Thank you for choosing [facility]!

## 2015-08-16 NOTE — ED Provider Notes (Signed)
CSN: [id number]     Arrival date & time 08/05/15  1823 History   First MD Initiated Contact with Patient 08/05/15 [place]     Chief Complaint  Patient presents with  . Chest Pain     (Consider location/radiation/quality/duration/timing/severity/associated sxs/prior Treatment) HPI   54yF w/ CP. Onset 3d ago. Pretty constant since then. Ache in center/L anterior chest. No appreciable exacerbating or relieving factors. No cough or other respiratory complaints. No fever or chills. No unusual leg pain or swelling. Denies hx of CAD.  Past Medical History  Diagnosis Date  . Overweight(278.02)     Obesity  . Hypertension   . Hyperlipidemia   . Palpitations   . Chest pain     denied on 06/18/2012  . Hypoglycemia   . Uterine fibroid     pt. states that she has a "closed cervix"   . Sinusitis, acute     seen by PCP- 06/17/2012, will treat /w augmentin & tussin/DM  . H/O hiatal hernia   . Headache(784.0)     using tylenol prn  . 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 Normal cardiac stress test     pt. released fr. Dr. [name] care in 10/2011, all findings w/i normal limits  . H. pylori infection     2012- stomach biopsy  . GERD (gastroesophageal reflux disease)     h/o colitis   . Family history of anesthesia complication     brother & neice "flat lined" during induction: neice d/t a medication given for a blood clotting problem; brother d/t "miscalculated amount of anesthesia"  . Chronic kidney disease     L - upper pole- defect, doesn't effect      Past Surgical History  Procedure Laterality Date  . Colonoscopy    . Lipoma excision      buttock 10/2010  . Craniotomy Right 06/24/2012    Procedure: CRANIOTOMY INTRACRANIAL ANEURYSM FOR CAROTID;  Surgeon: [name], MD;  Location: [facility];  Service: Neurosurgery;  Laterality: Right;  RIGHT Pterional Craniotomy for aneurysm   Family History  Problem Relation Age of Onset  . Hypertension Other   . Coronary artery disease Other     Social History  Substance Use Topics  . Smoking status: Former Smoker -- 0.80 packs/day for 15 years    Types: Cigarettes    Start date: 02/13/1989    Quit date: 04/28/2004  . Smokeless tobacco: Former Systems developer    Quit date: 06/19/2003  . Alcohol Use: No   OB History    No data available     Review of Systems  All systems reviewed and negative, other than as noted in HPI.   Allergies  Zithromax; Ace inhibitors; Ciprofloxacin; Other; Medrol; and Tetracyclines & related  Home Medications   Prior to Admission medications   Medication Sig Start Date End Date Taking? Authorizing Provider  acetaminophen (TYLENOL) 650 MG CR tablet Take 1,300 mg by mouth every 8 (eight) hours as needed for pain or fever.    Yes Historical Provider, MD  aspirin 81 MG tablet Take 1 tablet (81 mg total) by mouth daily. 04/03/14  Yes Nishant Dhungel, MD  Cholecalciferol (D 1000) 1000 UNITS tablet Take 1,000 Units by mouth every evening.    Yes Historical Provider, MD  diltiazem (CARDIZEM LA) 240 MG 24 hr tablet Take 180 mg in the morning and 240 mg in the evening Patient taking differently: Take 180-240 mg by mouth 2 (two) times daily. Take 180  mg in the morning and 240 mg in the evening 01/06/15  Yes Arnoldo Lenis, MD  FERREX 150 150 MG capsule Take 1 capsule by mouth daily. Takes it daily but prescribed twice daily 02/02/15  Yes Historical Provider, MD  hydrALAZINE (APRESOLINE) 50 MG tablet Take 50 mg by mouth 2 (two) times daily. 08/29/12  Yes Historical Provider, MD  loratadine (CLARITIN) 10 MG tablet Take 10 mg by mouth daily.    Yes Historical Provider, MD  metoprolol (LOPRESSOR) 50 MG tablet Take 50 mg by mouth 2 (two) times daily.   Yes Historical Provider, MD  pantoprazole (PROTONIX) 40 MG tablet Take 40 mg by mouth daily. 03/04/14  Yes Historical Provider, MD  PROAIR HFA 108 (90 Base) MCG/ACT inhaler INHALE 2 PUFF EVERY 4 HOURS AS NEEDED 06/06/15  Yes Historical Provider, MD  meclizine (ANTIVERT) 25 MG  tablet Take 1/2 Tablet Every 8 hours as needed for Dizziness 08/10/15   Lendon Colonel, NP  montelukast (SINGULAIR) 10 MG tablet Take 1 tablet by mouth daily. 02/08/15   Historical Provider, MD   BP 103/63 mmHg  Pulse 62  Temp(Src) 98.6 F (37 C)  Resp 14  Ht 5' 2.5" (1.588 m)  Wt 199 lb (90.266 kg)  BMI 35.80 kg/m2  SpO2 93%  LMP 08/25/2012 Physical Exam  Constitutional: She appears well-developed and well-nourished. No distress.  HENT:  Head: Normocephalic and atraumatic.  Eyes: Conjunctivae are normal. Right eye exhibits no discharge. Left eye exhibits no discharge.  Neck: Neck supple.  Cardiovascular: Normal rate, regular rhythm and normal heart sounds.  Exam reveals no gallop and no friction rub.   No murmur heard. Pulmonary/Chest: Effort normal and breath sounds normal. No respiratory distress.  Abdominal: Soft. She exhibits no distension. There is no tenderness.  Musculoskeletal: She exhibits no edema or tenderness.  Lower extremities symmetric as compared to each other. No calf tenderness. Negative Homan's. No palpable cords.   Neurological: She is alert.  Skin: Skin is warm and dry.  Psychiatric: She has a normal mood and affect. Her behavior is normal. Thought content normal.  Nursing note and vitals reviewed.   ED Course  Procedures (including critical care time) Labs Review Labs Reviewed  CBC WITH DIFFERENTIAL/PLATELET - Abnormal; Notable for the following:    Hemoglobin 11.8 (*)    HCT 35.9 (*)    All other components within normal limits  BASIC METABOLIC PANEL - Abnormal; Notable for the following:    BUN 25 (*)    Creatinine, Ser 1.28 (*)    GFR calc non Af Amer 47 (*)    GFR calc Af Amer 54 (*)    All other components within normal limits  TROPONIN I    Imaging Review No results found. I have personally reviewed and evaluated these images and lab results as part of my medical decision-making.   EKG Interpretation   Date/Time:  Saturday August 05 2015 18:33:31 EDT Ventricular Rate:  75 PR Interval:  193 QRS Duration: 100 QT Interval:  424 QTC Calculation: 474 R Axis:   40 Text Interpretation:  Sinus rhythm Low voltage, precordial leads Probable  anteroseptal infarct, old ED PHYSICIAN INTERPRETATION AVAILABLE IN [facility] Confirmed by TEST, Record ([id number]) on 08/06/2015 9:01:17 AM      MDM   Final diagnoses:  Chest pain, unspecified chest pain type    54yF with CP. Atypical for ACs. Doubt PE, dissection or other emergent process. It has been determined that no acute conditions  requiring further emergency intervention are present at this time. The patient has been advised of the diagnosis and plan. I reviewed any labs and imaging including any potential incidental findings. We have discussed signs and symptoms that warrant return to the ED and they are listed in the discharge instructions.      Virgel Manifold, MD 08/16/15 1345

## 2015-08-17 ENCOUNTER — Encounter (HOSPITAL_COMMUNITY)
Admission: RE | Admit: 2015-08-17 | Discharge: 2015-08-17 | Disposition: A | Discharge status: Home / Self Care | Payer: BLUE CROSS/BLUE SHIELD | Source: Ambulatory Visit | Attending: Adult Health | Admitting: Adult Health

## 2015-08-17 ENCOUNTER — Ambulatory Visit (HOSPITAL_COMMUNITY)
Admission: RE | Admit: 2015-08-17 | Discharge: 2015-08-17 | Disposition: A | Discharge status: Home / Self Care | Payer: BLUE CROSS/BLUE SHIELD | Source: Ambulatory Visit | Attending: Adult Health | Admitting: Adult Health

## 2015-08-17 DIAGNOSIS — R42 Dizziness and giddiness: Secondary | ICD-10-CM | POA: Diagnosis not present

## 2015-08-17 DIAGNOSIS — R931 Abnormal findings on diagnostic imaging of heart and coronary circulation: Secondary | ICD-10-CM | POA: Insufficient documentation

## 2015-08-17 MED ORDER — SODIUM CHLORIDE 0.9% FLUSH
INTRAVENOUS | Status: AC
  Administered 2015-08-17: 10 mL via INTRAVENOUS
  Filled 2015-08-17: qty 10

## 2015-08-17 MED ORDER — TECHNETIUM TC 99M SESTAMIBI GENERIC - CARDIOLITE
30.0000 | Freq: Once | INTRAVENOUS | Status: AC | PRN
Start: 2015-08-17 — End: 2015-08-17
  Administered 2015-08-17: 30 via INTRAVENOUS

## 2015-08-17 MED ORDER — REGADENOSON 0.4 MG/5ML IV SOLN
INTRAVENOUS | Status: AC
  Administered 2015-08-17: 0.4 mg via INTRAVENOUS
  Filled 2015-08-17: qty 5

## 2015-08-18 ENCOUNTER — Encounter (HOSPITAL_COMMUNITY)
Admission: RE | Admit: 2015-08-18 | Discharge: 2015-08-18 | Disposition: A | Discharge status: Home / Self Care | Payer: BLUE CROSS/BLUE SHIELD | Source: Ambulatory Visit | Attending: Adult Health | Admitting: Adult Health

## 2015-08-18 ENCOUNTER — Encounter (HOSPITAL_COMMUNITY): Payer: Self-pay

## 2015-08-18 DIAGNOSIS — R931 Abnormal findings on diagnostic imaging of heart and coronary circulation: Secondary | ICD-10-CM | POA: Diagnosis not present

## 2015-08-18 DIAGNOSIS — R42 Dizziness and giddiness: Secondary | ICD-10-CM | POA: Diagnosis not present

## 2015-08-18 LAB — NM MYOCAR MULTI W/SPECT W/WALL MOTION / EF
CHL CUP NUCLEAR SDS: 1
LHR: 0
LV dias vol: 78 mL (ref 46–106)
LV sys vol: 40 mL
Peak HR: 93 {beats}/min
Rest HR: 58 {beats}/min
SRS: 0
SSS: 1
TID: 1.3

## 2015-08-18 MED ORDER — TECHNETIUM TC 99M SESTAMIBI - CARDIOLITE
25.0000 | Freq: Once | INTRAVENOUS | Status: AC | PRN
  Administered 2015-08-18: 10:00:00 25 via INTRAVENOUS

## 2015-08-21 ENCOUNTER — Telehealth: Payer: Self-pay | Admitting: Adult Health

## 2015-08-21 NOTE — Telephone Encounter (Signed)
Results stress test given

## 2015-08-21 NOTE — Telephone Encounter (Signed)
Patient is returning call from [place].  Tried this morning but did not et [employer]

## 2015-08-31 ENCOUNTER — Ambulatory Visit (INDEPENDENT_AMBULATORY_CARE_PROVIDER_SITE_OTHER): Payer: BLUE CROSS/BLUE SHIELD | Admitting: Cardiology

## 2015-08-31 ENCOUNTER — Encounter: Payer: Self-pay | Admitting: Cardiology

## 2015-08-31 VITALS — BP 104/71 | HR 63 | Ht 62.0 in | Wt 206.4 lb

## 2015-08-31 DIAGNOSIS — R072 Precordial pain: Secondary | ICD-10-CM

## 2015-08-31 NOTE — Progress Notes (Signed)
Patient ID: [name] [name], female   DOB: 10-Sep-1960, 55 y.o.   MRN: [id number]     Clinical Summary [name] [name] is a 55 y.o.female seen today for follow up of the following medical problems. This is a focused visit on recent symptoms of chest pain.   1. Chest pain - recent episode of chest pain left chest, left neck and left shoulder blade. Fairly atypical - not related to palpitations. Can be tender to palpation. Worst with position, worst with deep breathing.  - since last visit she completed a lexiscan that was overall low risk, small apical defect probable artifact.  Past Medical History  Diagnosis Date  . Overweight(278.02)     Obesity  . Hypertension   . Hyperlipidemia   . Palpitations   . Chest pain     denied on 06/18/2012  . Hypoglycemia   . Uterine fibroid     pt. states that she has a "closed cervix"   . Sinusitis, acute     seen by PCP- 06/17/2012, will treat /w augmentin & tussin/DM  . H/O hiatal hernia   . Headache(784.0)     using tylenol prn  . 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 Normal cardiac stress test     pt. released fr. Dr. [name] care in 10/2011, all findings w/i normal limits  . H. pylori infection     2012- stomach biopsy  . GERD (gastroesophageal reflux disease)     h/o colitis   . Family history of anesthesia complication     brother & neice "flat lined" during induction: neice d/t a medication given for a blood clotting problem; brother d/t "miscalculated amount of anesthesia"  . Chronic kidney disease     L - upper pole- defect, doesn't effect        Allergies  Allergen Reactions  . Zithromax [Azithromycin] Anaphylaxis  . Ace Inhibitors Swelling  . Ciprofloxacin Nausea And Vomiting    Also: kidney failure   . Other Swelling, Other (See Comments) and Cough    Sneezing allergy to cats/grass/dust  . Medrol [Methylprednisolone] Palpitations    Heart racing, increased BP  . Tetracyclines & Related Palpitations    Heart racing, increased bp       Current Outpatient Prescriptions  Medication Sig Dispense Refill  . acetaminophen (TYLENOL) 650 MG CR tablet Take 1,300 mg by mouth every 8 (eight) hours as needed for pain or fever.     [name] aspirin 81 MG tablet Take 1 tablet (81 mg total) by mouth daily. 30 tablet 0  . Cholecalciferol (D 1000) 1000 UNITS tablet Take 1,000 Units by mouth every evening.     . diltiazem (CARDIZEM LA) 240 MG 24 hr tablet Take 180 mg in the morning and 240 mg in the evening (Patient taking differently: Take 180-240 mg by mouth 2 (two) times daily. Take 180 mg in the morning and 240 mg in the evening) 180 tablet 3  . FERREX 150 150 MG capsule Take 1 capsule by mouth daily. Takes it daily but prescribed twice daily  3  . hydrALAZINE (APRESOLINE) 50 MG tablet Take 50 mg by mouth 2 (two) times daily.    [name] loratadine (CLARITIN) 10 MG tablet Take 10 mg by mouth daily.     . meclizine (ANTIVERT) 25 MG tablet Take 1/2 Tablet Every 8 hours as needed for Dizziness 30 tablet 3  . metoprolol (LOPRESSOR) 50 MG tablet Take 50 mg by mouth 2 (two) times daily.    [name]  montelukast (SINGULAIR) 10 MG tablet Take 1 tablet by mouth daily.  1  . pantoprazole (PROTONIX) 40 MG tablet Take 40 mg by mouth daily.  1  . PROAIR HFA 108 (90 Base) MCG/ACT inhaler INHALE 2 PUFF EVERY 4 HOURS AS NEEDED  1   No current facility-administered medications for this visit.     Past Surgical History  Procedure Laterality Date  . Colonoscopy    . Lipoma excision      buttock 10/2010  . Craniotomy Right 06/24/2012    Procedure: CRANIOTOMY INTRACRANIAL ANEURYSM FOR CAROTID;  Surgeon: [name], MD;  Location: [facility];  Service: Neurosurgery;  Laterality: Right;  RIGHT Pterional Craniotomy for aneurysm     Allergies  Allergen Reactions  . Zithromax [Azithromycin] Anaphylaxis  . Ace Inhibitors Swelling  . Ciprofloxacin Nausea And Vomiting    Also: kidney failure   . Other Swelling, Other (See Comments) and Cough    Sneezing allergy  to cats/grass/dust  . Medrol [Methylprednisolone] Palpitations    Heart racing, increased BP  . Tetracyclines & Related Palpitations    Heart racing, increased bp      Family History  Problem Relation Age of Onset  . Hypertension Other   . Coronary artery disease Other      Social History [name] [name] reports that she quit smoking about 11 years ago. Her smoking use included Cigarettes. She started smoking about 26 years ago. She has a 12 pack-year smoking history. She quit smokeless tobacco use about 12 years ago. [name] [name] reports that she does not drink alcohol.   Review of Systems CONSTITUTIONAL: No weight loss, fever, chills, weakness or fatigue.  HEENT: Eyes: No visual loss, blurred vision, double vision or yellow sclerae.No hearing loss, sneezing, congestion, runny nose or sore throat.  SKIN: No rash or itching.  CARDIOVASCULAR: per HPI RESPIRATORY: No shortness of breath, cough or sputum.  GASTROINTESTINAL: No anorexia, nausea, vomiting or diarrhea. No abdominal pain or blood.  GENITOURINARY: No burning on urination, no polyuria NEUROLOGICAL: No headache, dizziness, syncope, paralysis, ataxia, numbness or tingling in the extremities. No change in bowel or bladder control.  MUSCULOSKELETAL: No muscle, back pain, joint pain or stiffness.  LYMPHATICS: No enlarged nodes. No history of splenectomy.  PSYCHIATRIC: No history of depression or anxiety.  ENDOCRINOLOGIC: No reports of sweating, cold or heat intolerance. No polyuria or polydipsia.  [name]   Physical Examination Filed Vitals:   08/31/15 1457  BP: 104/71  Pulse: 63   Filed Vitals:   08/31/15 1457  Height: 5\' 2"  (1.575 m)  Weight: 206 lb 6.4 oz (93.622 kg)    Gen: resting comfortably, no acute distress HEENT: no scleral icterus, pupils equal round and reactive, no palptable cervical adenopathy,  CV: RRR, no m/r/g, no jvd Resp: Clear to auscultation bilaterally GI: abdomen is soft, non-tender,  non-distended, normal bowel sounds, no hepatosplenomegaly MSK: extremities are warm, no edema.  Skin: warm, no rash Neuro:  no focal deficits Psych: appropriate affect   Diagnostic Studies 05/2009 Nuclear stress No ischemia  03/2014 Echo Study Conclusions  - Left ventricle: The cavity size was normal. Systolic function was normal. Wall motion was normal; there were no regional wall motion abnormalities.  03/2014 Lexiscan MPI IMPRESSION: 1. No reversible ischemia or infarction.  2. Normal left ventricular wall motion.  3. Left ventricular ejection fraction 78%  4. Low-risk stress test findings*.  03/2014 Event Monitor Symptoms correlate with NSR. One episode of fluttering showed 2 PACs. No significant arrhythmias  07/2015 Lexiscan MPI  There was no ST segment deviation noted during stress.  This is a low risk study.  The left ventricular ejection fraction is mildly decreased (45-54%). LVEF is 49% just slightly below normal, visually appears normal. Consider correlating with echo.  There is a small mild intensity distal anterior and apical defect with mild reversibility. The apex has normal wall motion. Finding is likely secondary to apical thinning, there is also significant radiotracer uptake in the adjacent gut which may create some artifact. Cannot rule would mild apical ischemia. Overall findings are low risk.  Assessment and Plan  1. Chest pain - atypical symptoms, lexiscan without specific ischemia and overall low risk - no further cardiac workup at this time, continue current meds  2. OSA screen - she is still awaiting for her sleep study to be scheduled at [facility]    F/u 6 months.     Arnoldo Lenis, M.D.

## 2015-08-31 NOTE — Patient Instructions (Signed)

## 2015-09-14 DIAGNOSIS — I10 Essential (primary) hypertension: Secondary | ICD-10-CM | POA: Diagnosis not present

## 2015-09-14 DIAGNOSIS — G43909 Migraine, unspecified, not intractable, without status migrainosus: Secondary | ICD-10-CM | POA: Diagnosis not present

## 2015-09-14 DIAGNOSIS — E6609 Other obesity due to excess calories: Secondary | ICD-10-CM | POA: Diagnosis not present

## 2015-09-14 DIAGNOSIS — E782 Mixed hyperlipidemia: Secondary | ICD-10-CM | POA: Diagnosis not present

## 2015-09-27 DIAGNOSIS — R079 Chest pain, unspecified: Secondary | ICD-10-CM | POA: Diagnosis not present

## 2015-09-27 DIAGNOSIS — M940 Chondrocostal junction syndrome [Tietze]: Secondary | ICD-10-CM | POA: Diagnosis not present

## 2015-10-09 ENCOUNTER — Ambulatory Visit: Payer: BLUE CROSS/BLUE SHIELD | Attending: Pulmonary Disease | Admitting: Neurology

## 2015-10-09 DIAGNOSIS — G4730 Sleep apnea, unspecified: Secondary | ICD-10-CM | POA: Diagnosis present

## 2015-10-09 DIAGNOSIS — R0683 Snoring: Secondary | ICD-10-CM | POA: Diagnosis not present

## 2015-10-09 DIAGNOSIS — Z79899 Other long term (current) drug therapy: Secondary | ICD-10-CM | POA: Diagnosis not present

## 2015-10-09 DIAGNOSIS — Z7982 Long term (current) use of aspirin: Secondary | ICD-10-CM | POA: Diagnosis not present

## 2015-10-09 DIAGNOSIS — G4733 Obstructive sleep apnea (adult) (pediatric): Secondary | ICD-10-CM | POA: Diagnosis not present

## 2015-10-10 DIAGNOSIS — Z888 Allergy status to other drugs, medicaments and biological substances status: Secondary | ICD-10-CM | POA: Diagnosis not present

## 2015-10-10 DIAGNOSIS — Z87891 Personal history of nicotine dependence: Secondary | ICD-10-CM | POA: Diagnosis not present

## 2015-10-10 DIAGNOSIS — Z881 Allergy status to other antibiotic agents status: Secondary | ICD-10-CM | POA: Diagnosis not present

## 2015-10-10 DIAGNOSIS — M545 Low back pain: Secondary | ICD-10-CM | POA: Diagnosis not present

## 2015-10-10 DIAGNOSIS — Z79899 Other long term (current) drug therapy: Secondary | ICD-10-CM | POA: Diagnosis not present

## 2015-10-10 DIAGNOSIS — K219 Gastro-esophageal reflux disease without esophagitis: Secondary | ICD-10-CM | POA: Diagnosis not present

## 2015-10-10 DIAGNOSIS — I10 Essential (primary) hypertension: Secondary | ICD-10-CM | POA: Diagnosis not present

## 2015-10-10 DIAGNOSIS — Z7982 Long term (current) use of aspirin: Secondary | ICD-10-CM | POA: Diagnosis not present

## 2015-10-10 DIAGNOSIS — Z886 Allergy status to analgesic agent status: Secondary | ICD-10-CM | POA: Diagnosis not present

## 2015-10-14 NOTE — Procedures (Signed)
[place] A. Merlene Laughter, MD     [email or web address]             NOCTURNAL POLYSOMNOGRAPHY   LOCATION: [facility]  Patient Name: Annaleigha, Lilienthal Date: 10/09/2015 Gender: Female D.O.B: 02/23/1961 Age (years): 31 Referring Provider: Not Available Height (inches): 62 Interpreting Physician: Phillips Odor MD, ABSM Weight (lbs): 206 RPSGT: Rosebud Poles BMI: 38 MRN: [id number] Neck Size: 16.50 CLINICAL INFORMATION Sleep Study Type: NPSG Indication for sleep study: N/A Epworth Sleepiness Score: 5 SLEEP STUDY TECHNIQUE As per the AASM Manual for the Scoring of Sleep and Associated Events v2.3 (April 2016) with a hypopnea requiring 4% desaturations. The channels recorded and monitored were frontal, central and occipital EEG, electrooculogram (EOG), submentalis EMG (chin), nasal and oral airflow, thoracic and abdominal wall motion, anterior tibialis EMG, snore microphone, electrocardiogram, and pulse oximetry. MEDICATIONS Patient's medications include: N/A. Medications self-administered by patient during sleep study : No sleep medicine administered.  Current outpatient prescriptions:  .  acetaminophen (TYLENOL) 650 MG CR tablet, Take 1,300 mg by mouth every 8 (eight) hours as needed for pain or fever. , Disp: , Rfl:  .  aspirin 81 MG tablet, Take 1 tablet (81 mg total) by mouth daily., Disp: 30 tablet, Rfl: 0 .  Cholecalciferol (D 1000) 1000 UNITS tablet, Take 1,000 Units by mouth every evening. , Disp: , Rfl:  .  diltiazem (CARDIZEM LA) 240 MG 24 hr tablet, Take 180 mg in the morning and 240 mg in the evening (Patient taking differently: Take 180-240 mg by mouth 2 (two) times daily. Take 180 mg in the morning and 240 mg in the evening), Disp: 180 tablet, Rfl: 3 .  FERREX 150 150 MG capsule, Take 1 capsule by mouth daily. Takes it daily but prescribed twice daily, Disp: , Rfl: 3 .  hydrALAZINE (APRESOLINE) 50 MG tablet, Take 50 mg by mouth 2 (two) times daily.,  Disp: , Rfl:  .  loratadine (CLARITIN) 10 MG tablet, Take 10 mg by mouth daily. , Disp: , Rfl:  .  meclizine (ANTIVERT) 25 MG tablet, Take 1/2 Tablet Every 8 hours as needed for Dizziness, Disp: 30 tablet, Rfl: 3 .  metoprolol (LOPRESSOR) 50 MG tablet, Take 50 mg by mouth 2 (two) times daily., Disp: , Rfl:  .  montelukast (SINGULAIR) 10 MG tablet, Take 1 tablet by mouth daily., Disp: , Rfl: 1 .  pantoprazole (PROTONIX) 40 MG tablet, Take 40 mg by mouth daily., Disp: , Rfl: 1 .  PROAIR HFA 108 (90 Base) MCG/ACT inhaler, INHALE 2 PUFF EVERY 4 HOURS AS NEEDED, Disp: , Rfl: 1  SLEEP ARCHITECTURE The study was initiated at 10:57:00 PM and ended at 5:20:04 AM. Sleep onset time was 19.3 minutes and the sleep efficiency was 75.3%. The total sleep time was 288.5 minutes. Stage REM latency was 190.0 minutes. The patient spent 3.81% of the night in stage N1 sleep, 46.45% in stage N2 sleep, 30.16% in stage N3 and 19.58% in REM. Alpha intrusion was absent. Supine sleep was 59.67%. RESPIRATORY PARAMETERS The overall apnea/hypopnea index (AHI) was 5.2 per hour. There were 6 total apneas, including 3 obstructive, 0 central and 3 mixed apneas. There were 19 hypopneas and 0 RERAs. The AHI during Stage REM sleep was 19.1 per hour. AHI while supine was 8.7 per hour. The mean oxygen saturation was 93.85%. The minimum SpO2 during sleep was 86.00%. Moderate snoring was noted during this study. CARDIAC DATA The 2 lead EKG demonstrated sinus rhythm. The mean heart rate was  N/A beats per minute. Other EKG findings include: None. LEG MOVEMENT DATA The total PLMS were 0 with a resulting PLMS index of 0.00. Associated arousal with leg movement index was 0.0.    IMPRESSIONS - Mild obstructive sleep apnea not requiring positive pressure treatment.   Delano Metz, MD Diplomate, American Board of Sleep Medicine.

## 2015-10-31 ENCOUNTER — Emergency Department (HOSPITAL_COMMUNITY)
Admission: EM | Admit: 2015-10-31 | Discharge: 2015-10-31 | Disposition: A | Discharge status: Home / Self Care | Payer: BLUE CROSS/BLUE SHIELD | Attending: Emergency Medicine | Admitting: Emergency Medicine

## 2015-10-31 ENCOUNTER — Encounter (HOSPITAL_COMMUNITY): Payer: Self-pay | Admitting: Emergency Medicine

## 2015-10-31 ENCOUNTER — Emergency Department (HOSPITAL_COMMUNITY): Payer: BLUE CROSS/BLUE SHIELD

## 2015-10-31 DIAGNOSIS — R079 Chest pain, unspecified: Secondary | ICD-10-CM | POA: Diagnosis not present

## 2015-10-31 DIAGNOSIS — N189 Chronic kidney disease, unspecified: Secondary | ICD-10-CM | POA: Insufficient documentation

## 2015-10-31 DIAGNOSIS — M5442 Lumbago with sciatica, left side: Secondary | ICD-10-CM | POA: Diagnosis not present

## 2015-10-31 DIAGNOSIS — R002 Palpitations: Secondary | ICD-10-CM | POA: Insufficient documentation

## 2015-10-31 DIAGNOSIS — M5136 Other intervertebral disc degeneration, lumbar region: Secondary | ICD-10-CM | POA: Diagnosis not present

## 2015-10-31 DIAGNOSIS — E785 Hyperlipidemia, unspecified: Secondary | ICD-10-CM | POA: Insufficient documentation

## 2015-10-31 DIAGNOSIS — Z7982 Long term (current) use of aspirin: Secondary | ICD-10-CM | POA: Diagnosis not present

## 2015-10-31 DIAGNOSIS — R05 Cough: Secondary | ICD-10-CM | POA: Insufficient documentation

## 2015-10-31 DIAGNOSIS — Z87891 Personal history of nicotine dependence: Secondary | ICD-10-CM | POA: Insufficient documentation

## 2015-10-31 DIAGNOSIS — Z79899 Other long term (current) drug therapy: Secondary | ICD-10-CM | POA: Diagnosis not present

## 2015-10-31 DIAGNOSIS — I129 Hypertensive chronic kidney disease with stage 1 through stage 4 chronic kidney disease, or unspecified chronic kidney disease: Secondary | ICD-10-CM | POA: Diagnosis not present

## 2015-10-31 DIAGNOSIS — M545 Low back pain: Secondary | ICD-10-CM | POA: Diagnosis not present

## 2015-10-31 LAB — URINALYSIS, ROUTINE W REFLEX MICROSCOPIC
BILIRUBIN URINE: NEGATIVE
Glucose, UA: NEGATIVE mg/dL
Hgb urine dipstick: NEGATIVE
Ketones, ur: NEGATIVE mg/dL
NITRITE: NEGATIVE
PROTEIN: NEGATIVE mg/dL
SPECIFIC GRAVITY, URINE: 1.015 (ref 1.005–1.030)
pH: 5.5 (ref 5.0–8.0)

## 2015-10-31 LAB — URINE MICROSCOPIC-ADD ON: RBC / HPF: NONE SEEN RBC/hpf (ref 0–5)

## 2015-10-31 MED ORDER — DICLOFENAC SODIUM 75 MG PO TBEC: 75.0000 mg | DELAYED_RELEASE_TABLET | Freq: Two times a day (BID) | ORAL | Status: DC

## 2015-10-31 MED ORDER — METHOCARBAMOL 500 MG PO TABS: 500.0000 mg | ORAL_TABLET | Freq: Three times a day (TID) | ORAL | Status: DC

## 2015-10-31 MED ORDER — HYDROCODONE-ACETAMINOPHEN 5-325 MG PO TABS: 1.0000 | ORAL_TABLET | ORAL | Status: DC | PRN

## 2015-10-31 NOTE — Discharge Instructions (Signed)
Your vital signs within normal limits. Your chest x-ray is negative for any acute changes. Your urine has been sent to the lab for culture, but does not show an acute infection at this time. The x-ray of your lumbar spine shows degenerative disc changes at L3-L4, and L4-L5. Please see Dr.Xu for orthopedic evaluation of this issue. Heating pad to your back will be helpful. Please use Robaxin 3 times daily for spasm pain. Use diclofenac 2 times daily for 5 days only. May use Norco for more severe pain. Robaxin and Norco may cause drowsiness, please use these medications with caution. Degenerative Disk Disease Degenerative disk disease is a condition caused by the changes that occur in spinal disks as you grow older. Spinal disks are soft and compressible disks located between the bones of your spine (vertebrae). These disks act like shock absorbers. Degenerative disk disease can affect the whole spine. However, the neck and lower back are most commonly affected. Many changes can occur in the spinal disks with aging, such as:  The spinal disks may dry and shrink.  Small tears may occur in the tough, outer covering of the disk (annulus).  The disk space may become smaller due to loss of water.  Abnormal growths in the bone (spurs) may occur. This can put pressure on the nerve roots exiting the spinal canal, causing pain.  The spinal canal may become narrowed. RISK FACTORS   Being overweight.  Having a family history of degenerative disk disease.  Smoking.  There is increased risk if you are doing heavy lifting or have a sudden injury. SIGNS AND SYMPTOMS  Symptoms vary from person to person and may include:  Pain that varies in intensity. Some people have no pain, while others have severe pain. The location of the pain depends on the part of your backbone that is affected.  You will have neck or arm pain if a disk in the neck area is affected.  You will have pain in your back, buttocks, or  legs if a disk in the lower back is affected.  Pain that becomes worse while bending, reaching up, or with twisting movements.  Pain that may start gradually and then get worse as time passes. It may also start after a major or minor injury.  Numbness or tingling in the arms or legs. DIAGNOSIS  Your health care provider will ask you about your symptoms and about activities or habits that may cause the pain. He or she may also ask about any injuries, diseases, or treatments you have had. Your health care provider will examine you to check for the range of movement that is possible in the affected area, to check for strength in your extremities, and to check for sensation in the areas of the arms and legs supplied by different nerve roots. You may also have:   An X-ray of the spine.  Other imaging tests, such as MRI. TREATMENT  Your health care provider will advise you on the best plan for treatment. Treatment may include:  Medicines.  Rehabilitation exercises. HOME CARE INSTRUCTIONS   Follow proper lifting and walking techniques as advised by your health care provider.  Maintain good posture.  Exercise regularly as advised by your health care provider.  Perform relaxation exercises.  Change your sitting, standing, and sleeping habits as advised by your health care provider.  Change positions frequently.  Lose weight or maintain a healthy weight as advised by your health care provider.  Do not use any tobacco  products, including cigarettes, chewing tobacco, or electronic cigarettes. If you need help quitting, ask your health care provider.  Wear supportive footwear.  Take medicines only as directed by your health care provider. SEEK MEDICAL CARE IF:   Your pain does not go away within 1-4 weeks.  You have significant appetite or weight loss. SEEK IMMEDIATE MEDICAL CARE IF:   Your pain is severe.  You notice weakness in your arms, hands, or legs.  You begin to lose  control of your bladder or bowel movements.  You have fevers or night sweats. MAKE SURE YOU:   Understand these instructions.  Will watch your condition.  Will get help right away if you are not doing well or get worse.   This information is not intended to replace advice given to you by your health care provider. Make sure you discuss any questions you have with your health care provider.   Document Released: 02/10/2007 Document Revised: 05/06/2014 Document Reviewed: 08/17/2013 Elsevier Interactive Patient Education [employer].

## 2015-10-31 NOTE — ED Notes (Signed)
Pt reports left lower back pain for two weeks, was seen by doctor and told she had strained a muscle and denies injury. Pt does mention that urine has strong odor.  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

## 2015-10-31 NOTE — ED Provider Notes (Signed)
CSN: [id number]     Arrival date & time 10/31/15  0903 History   First MD Initiated Contact with Patient 10/31/15 [phone]     Chief Complaint  Patient presents with  . Back Pain     (Consider location/radiation/quality/duration/timing/severity/associated sxs/prior Treatment) HPI Comments: Patient is a 55 year old female who presents to the emergency department with a complaint of low back pain. The patient states that his problem actually started about 2 weeks ago on. She was seen by a physician at another facility, and told she had some muscle strain. She states she was treated with warm compresses and Tylenol. She states this did nothing for her pain. She continues to have increasing issues with her back. She is concerned because she has a kidney on the left that is not functioning correctly. She also states that recently her urine seems to have a strong odor to it. The patient is also concerned for other possible causes of back pain. She's not had any previous operations or procedures involving her back on. 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 She states that since that time she has been having problems off and on with her upper back and across her shoulders. The patient also states that she's been having increasing cough for about a week, and she would like to have this checked as well.  Patient is a 55 y.o. female presenting with back pain. The history is provided by the patient.  Back Pain   Past Medical History  Diagnosis Date  . Overweight(278.02)     Obesity  . Hypertension   . Hyperlipidemia   . Palpitations   . Chest pain     denied on 06/18/2012  . Hypoglycemia   . Uterine fibroid     pt. states that she has a "closed cervix"   . Sinusitis, acute     seen by PCP- 06/17/2012, will treat /w augmentin & tussin/DM  . H/O hiatal hernia   . Headache(784.0)     using tylenol prn  . 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 Normal cardiac stress  test     pt. released fr. Dr. [name] care in 10/2011, all findings w/i normal limits  . H. pylori infection     2012- stomach biopsy  . GERD (gastroesophageal reflux disease)     h/o colitis   . Family history of anesthesia complication     brother & neice "flat lined" during induction: neice d/t a medication given for a blood clotting problem; brother d/t "miscalculated amount of anesthesia"  . Chronic kidney disease     L - upper pole- defect, doesn't effect      Past Surgical History  Procedure Laterality Date  . Colonoscopy    . Lipoma excision      buttock 10/2010  . Craniotomy Right 06/24/2012    Procedure: CRANIOTOMY INTRACRANIAL ANEURYSM FOR CAROTID;  Surgeon: [name], MD;  Location: [facility];  Service: Neurosurgery;  Laterality: Right;  RIGHT Pterional Craniotomy for aneurysm   Family History  Problem Relation Age of Onset  . Hypertension Other   . Coronary artery disease Other    Social History  Substance Use Topics  . Smoking status: Former Smoker -- 0.80 packs/day for 15 years    Types: Cigarettes    Start date: 02/13/1989    Quit date: 04/28/2004  . Smokeless tobacco: Former Systems developer    Quit date: 06/19/2003  .  Alcohol Use: No   OB History    No data available     Review of Systems  Respiratory: Positive for cough.   Cardiovascular: Positive for palpitations.  Musculoskeletal: Positive for back pain.  All other systems reviewed and are negative.     Allergies  Zithromax; Ace inhibitors; Ciprofloxacin; Other; Medrol; and Tetracyclines & related  Home Medications   Prior to Admission medications   Medication Sig Start Date End Date Taking? Authorizing Provider  acetaminophen (TYLENOL) 650 MG CR tablet Take 1,300 mg by mouth every 8 (eight) hours as needed for pain or fever.     Historical Provider, MD  aspirin 81 MG tablet Take 1 tablet (81 mg total) by mouth daily. 04/03/14   Nishant Dhungel, MD  Cholecalciferol (D 1000) 1000 UNITS tablet Take  1,000 Units by mouth every evening.     Historical Provider, MD  diltiazem (CARDIZEM LA) 240 MG 24 hr tablet Take 180 mg in the morning and 240 mg in the evening Patient taking differently: Take 180-240 mg by mouth 2 (two) times daily. Take 180 mg in the morning and 240 mg in the evening 01/06/15   Arnoldo Lenis, MD  FERREX 150 150 MG capsule Take 1 capsule by mouth daily. Takes it daily but prescribed twice daily 02/02/15   Historical Provider, MD  hydrALAZINE (APRESOLINE) 50 MG tablet Take 50 mg by mouth 2 (two) times daily. 08/29/12   Historical Provider, MD  loratadine (CLARITIN) 10 MG tablet Take 10 mg by mouth daily.     Historical Provider, MD  meclizine (ANTIVERT) 25 MG tablet Take 1/2 Tablet Every 8 hours as needed for Dizziness 08/10/15   Lendon Colonel, NP  metoprolol (LOPRESSOR) 50 MG tablet Take 50 mg by mouth 2 (two) times daily.    Historical Provider, MD  montelukast (SINGULAIR) 10 MG tablet Take 1 tablet by mouth daily. 02/08/15   Historical Provider, MD  pantoprazole (PROTONIX) 40 MG tablet Take 40 mg by mouth daily. 03/04/14   Historical Provider, MD  PROAIR HFA 108 (90 Base) MCG/ACT inhaler INHALE 2 PUFF EVERY 4 HOURS AS NEEDED 06/06/15   Historical Provider, MD   BP 137/79 mmHg  Pulse 54  Temp(Src) 98.7 F (37.1 C) (Oral)  Resp 18  Ht 5\' 2"  (1.575 m)  Wt 90.719 kg  BMI 36.57 kg/m2  SpO2 97%  LMP 08/25/2012 Physical Exam  Constitutional: She is oriented to person, place, and time. She appears well-developed and well-nourished.  Non-toxic appearance.  HENT:  Head: Normocephalic.  Right Ear: Tympanic membrane and external ear normal.  Left Ear: Tympanic membrane and external ear normal.  Eyes: EOM and lids are normal. Pupils are equal, round, and reactive to light.  Neck: Normal range of motion. Neck supple. Carotid bruit is not present.  Cardiovascular: Normal rate, regular rhythm, normal heart sounds, intact distal pulses and normal pulses.   Pulmonary/Chest: No  respiratory distress. She has rhonchi.  Few scattered rhonchi.  Abdominal: Soft. Bowel sounds are normal. There is no tenderness. There is no guarding.  Musculoskeletal: Normal range of motion.       Cervical back: She exhibits tenderness and spasm.       Thoracic back: She exhibits tenderness.       Lumbar back: She exhibits pain and spasm.       Back:  Lymphadenopathy:       Head (right side): No submandibular adenopathy present.       Head (left side): No submandibular  adenopathy present.    She has no cervical adenopathy.  Neurological: She is alert and oriented to person, place, and time. She has normal strength. No cranial nerve deficit or sensory deficit.  Skin: Skin is warm and dry.  Psychiatric: She has a normal mood and affect. Her speech is normal.  Nursing note and vitals reviewed.   ED Course  Procedures (including critical care time) Labs Review Labs Reviewed  URINALYSIS, ROUTINE W REFLEX MICROSCOPIC (NOT AT [facility]) - Abnormal; Notable for the following:    APPearance HAZY (*)    Leukocytes, UA SMALL (*)    All other components within normal limits  URINE MICROSCOPIC-ADD ON - Abnormal; Notable for the following:    Squamous Epithelial / LPF 0-5 (*)    Bacteria, UA MANY (*)    All other components within normal limits  URINE CULTURE    Imaging Review No results found. I have personally reviewed and evaluated these images and lab results as part of my medical decision-making.   EKG Interpretation None      MDM  Vital signs within normal limits. Urinalysis is negative for acute infection. Chest x-ray is negative for acute problem. Lumbar spine shows no fracture or dislocation, there is noted on loss of disc height at L3-L4 and L4-L5.  No gross neurologic deficits appreciated. The patient pain easily reproduced with certain range of motion exercises and exam.  The patient is referred to orthopedics on. Prescription for Robaxin, Voltaren, and Norco given to the  patient. Patient acknowledges understanding of the discharge instructions.    Final diagnoses:  DDD (degenerative disc disease), lumbar  Left-sided low back pain with left-sided sciatica    *I have reviewed nursing notes, vital signs, and all appropriate lab and imaging results for this patient.    Lily Kocher, PA-C 11/01/15 [place] Liu, MD 11/02/15 [phone]

## 2015-11-03 LAB — URINE CULTURE

## 2015-11-21 ENCOUNTER — Emergency Department (HOSPITAL_COMMUNITY)
Admission: EM | Admit: 2015-11-21 | Discharge: 2015-11-21 | Disposition: A | Discharge status: Home / Self Care | Payer: BLUE CROSS/BLUE SHIELD | Attending: Emergency Medicine | Admitting: Emergency Medicine

## 2015-11-21 ENCOUNTER — Encounter (HOSPITAL_COMMUNITY): Payer: Self-pay | Admitting: Emergency Medicine

## 2015-11-21 ENCOUNTER — Emergency Department (HOSPITAL_COMMUNITY): Payer: BLUE CROSS/BLUE SHIELD

## 2015-11-21 DIAGNOSIS — Z7982 Long term (current) use of aspirin: Secondary | ICD-10-CM | POA: Insufficient documentation

## 2015-11-21 DIAGNOSIS — R51 Headache: Secondary | ICD-10-CM | POA: Diagnosis not present

## 2015-11-21 DIAGNOSIS — Z87891 Personal history of nicotine dependence: Secondary | ICD-10-CM | POA: Insufficient documentation

## 2015-11-21 DIAGNOSIS — I129 Hypertensive chronic kidney disease with stage 1 through stage 4 chronic kidney disease, or unspecified chronic kidney disease: Secondary | ICD-10-CM | POA: Diagnosis not present

## 2015-11-21 DIAGNOSIS — N189 Chronic kidney disease, unspecified: Secondary | ICD-10-CM | POA: Insufficient documentation

## 2015-11-21 DIAGNOSIS — R1110 Vomiting, unspecified: Secondary | ICD-10-CM | POA: Diagnosis not present

## 2015-11-21 DIAGNOSIS — Z79899 Other long term (current) drug therapy: Secondary | ICD-10-CM | POA: Diagnosis not present

## 2015-11-21 DIAGNOSIS — R519 Headache, unspecified: Secondary | ICD-10-CM

## 2015-11-21 DIAGNOSIS — E785 Hyperlipidemia, unspecified: Secondary | ICD-10-CM | POA: Insufficient documentation

## 2015-11-21 LAB — URINALYSIS, ROUTINE W REFLEX MICROSCOPIC
Bilirubin Urine: NEGATIVE
GLUCOSE, UA: NEGATIVE mg/dL
HGB URINE DIPSTICK: NEGATIVE
Ketones, ur: NEGATIVE mg/dL
Leukocytes, UA: NEGATIVE
Nitrite: NEGATIVE
PH: 6 (ref 5.0–8.0)
PROTEIN: NEGATIVE mg/dL
Specific Gravity, Urine: 1.01 (ref 1.005–1.030)

## 2015-11-21 LAB — I-STAT CHEM 8, ED
BUN: 20 mg/dL (ref 6–20)
CREATININE: 1.2 mg/dL — AB (ref 0.44–1.00)
Calcium, Ion: 1.24 mmol/L (ref 1.13–1.30)
Chloride: 108 mmol/L (ref 101–111)
GLUCOSE: 88 mg/dL (ref 65–99)
HCT: 41 % (ref 36.0–46.0)
HEMOGLOBIN: 13.9 g/dL (ref 12.0–15.0)
POTASSIUM: 3.9 mmol/L (ref 3.5–5.1)
Sodium: 143 mmol/L (ref 135–145)
TCO2: 26 mmol/L (ref 0–100)

## 2015-11-21 MED ORDER — METOCLOPRAMIDE HCL 10 MG PO TABS: 10.0000 mg | ORAL_TABLET | Freq: Four times a day (QID) | ORAL | 0 refills | Status: DC | PRN

## 2015-11-21 MED ORDER — KETOROLAC TROMETHAMINE 30 MG/ML IJ SOLN
30.0000 mg | Freq: Once | INTRAMUSCULAR | Status: AC
  Administered 2015-11-21: 30 mg via INTRAVENOUS
  Filled 2015-11-21: qty 1

## 2015-11-21 MED ORDER — SODIUM CHLORIDE 0.9 % IV BOLUS (SEPSIS)
1000.0000 mL | Freq: Once | INTRAVENOUS | Status: AC
  Administered 2015-11-21: 1000 mL via INTRAVENOUS

## 2015-11-21 MED ORDER — IBUPROFEN 800 MG PO TABS: 800.0000 mg | ORAL_TABLET | Freq: Three times a day (TID) | ORAL | 0 refills | Status: DC | PRN

## 2015-11-21 MED ORDER — DIPHENHYDRAMINE HCL 50 MG/ML IJ SOLN
50.0000 mg | Freq: Once | INTRAMUSCULAR | Status: AC
  Administered 2015-11-21: 50 mg via INTRAVENOUS
  Filled 2015-11-21: qty 1

## 2015-11-21 MED ORDER — METOCLOPRAMIDE HCL 5 MG/ML IJ SOLN
10.0000 mg | Freq: Once | INTRAMUSCULAR | Status: AC
  Administered 2015-11-21: 10 mg via INTRAVENOUS
  Filled 2015-11-21: qty 2

## 2015-11-21 NOTE — ED Triage Notes (Signed)
PT c/o headache unrelieved by tylenol at home x2 weeks.

## 2015-11-21 NOTE — Discharge Instructions (Signed)

## 2015-11-21 NOTE — ED Provider Notes (Signed)
Emergency Department Provider Note  By signing my name below, I, Emmanuella Mensah, attest that this documentation has been prepared under the direction and in the presence of Margette Fast, MD. Electronically Signed: Judithann Sauger, ED Scribe. 11/21/15. 12:36 PM.   HISTORY  Chief Complaint Headache  HPI Comments: [name] [name] is a 55 y.o. female with a hx of brain aneurysm, chronic headaches, HLD, and hypertension who presents to the Emergency Department complaining of gradually worsening waxing and waning moderate generalized headache onset 2 weeks ago. She notes that her headache feels as though it is a "halo of pressure" and her pain began on her left temporal after she was lightheaded for approx 3-4 days. She reports associated dull sensation to her left face, mild photophobia, and one episode of non-bloody vomiting 4 days ago. No alleviating factors noted. She has tried Tylenol with no significant relief. Pt states that she has been experiencing ongoing posterior neck pain with or without the headache and is worried that they are all related. She denies any fever, chills, chest pain, SOB, acute visual disturbances, numbness/weakness to her extremities, abdominal pain, nausea, or diarrhea.    Past Medical History:  Diagnosis Date  . Aneurysm ([place])    brain  . Chest pain    denied on 06/18/2012  . Chronic kidney disease    L - upper pole- defect, doesn't effect     . Family history of anesthesia complication    brother & neice "flat lined" during induction: neice d/t a medication given for a blood clotting problem; brother d/t "miscalculated amount of anesthesia"  . GERD (gastroesophageal reflux disease)    h/o colitis   . H. pylori infection    2012- stomach biopsy  . H/O hiatal hernia   . Headache(784.0)    using tylenol prn  . Hyperlipidemia   . Hypertension   . Hypoglycemia   . Normal cardiac stress test    pt. released fr. Dr. [name] care in 10/2011, all  findings w/i normal limits  . Overweight(278.02)    Obesity  . Palpitations   . Sinusitis, acute    seen by PCP- 06/17/2012, will treat /w augmentin & tussin/DM  . Uterine fibroid    pt. states that she has a "closed cervix"     Patient Active Problem List   Diagnosis Date Noted  . Chest tightness or pressure 04/03/2014  . Dizziness 04/03/2014  . Headache 04/03/2014  . Chest pain 04/02/2014  . GERD (gastroesophageal reflux disease) 04/02/2014  . Meningitis, unspecified(322.9) 08/11/2012  . 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 HLD (hyperlipidemia) 01/05/2009  . OVERWEIGHT/OBESITY 01/05/2009  . Essential hypertension 01/05/2009  . Palpitations 01/05/2009  . CHEST PAIN-UNSPECIFIED 01/05/2009    Past Surgical History:  Procedure Laterality Date  . COLONOSCOPY    . CRANIOTOMY Right 06/24/2012   Procedure: CRANIOTOMY INTRACRANIAL ANEURYSM FOR CAROTID;  Surgeon: [name], MD;  Location: [facility];  Service: Neurosurgery;  Laterality: Right;  RIGHT Pterional Craniotomy for aneurysm  . LIPOMA EXCISION     buttock 10/2010    Current Outpatient Rx  . Order #: [id number] Class: Historical Med  . Order #: [id number] Class: Print  . Order #: [id number] Class: Historical Med  . Order #: [id number] Class: Normal  . Order #: [id number] Class: Historical Med  . Order #: [id number] Class: Historical Med  . Order #: [id number] Class: Historical Med  . Order #: [id number] Class: Historical Med  . Order #: [id number] Class: Historical Med  . Order #: [id number] Class: Print  .  Order #: [id number] Class: Print    Allergies Zithromax [azithromycin]; Ace inhibitors; Ciprofloxacin; Other; Medrol [methylprednisolone]; and Tetracyclines & related  Family History  Problem Relation Age of Onset  . Hypertension Other   . Coronary artery disease Other     Social History Social History  Substance Use Topics  . Smoking status: Former Smoker    Packs/day: 0.80    Years: 15.00    Types: Cigarettes      Start date: 02/13/1989    Quit date: 04/28/2004  . Smokeless tobacco: Former Systems developer    Quit date: 06/19/2003  . Alcohol use No    Review of Systems Constitutional: No fever/chills Eyes: No visual changes. ENT: No sore throat. Cardiovascular: Denies chest pain. Respiratory: Denies shortness of breath. Gastrointestinal: No abdominal pain.  No nausea, vomiting.  No diarrhea.  No constipation. Genitourinary: Negative for dysuria. Musculoskeletal: Negative for back pain. Skin: Negative for rash. Neurological: Headache. Negative for focal weakness or numbness.  10-point ROS otherwise negative.  ____________________________________________   PHYSICAL EXAM:  VITAL SIGNS: ED Triage Vitals [11/21/15 1114]  Enc Vitals Group     BP 127/57     Pulse Rate 62     Resp 16     Temp 98.8 F (37.1 C)     Temp Source Oral     SpO2 96 %     Weight 199 lb (90.3 kg)     Height 5\' 2"  (1.575 m)     Pain Score 8    Constitutional: Alert and oriented. Well appearing and in no acute distress. Eyes: Conjunctivae are normal. PERRL. EOMI. Head: Atraumatic. Nose: No congestion/rhinnorhea. Mouth/Throat: Mucous membranes are moist.  Oropharynx non-erythematous. Neck: No stridor.  No meningeal signs.  Cardiovascular: Normal rate, regular rhythm. Good peripheral circulation. Grossly normal heart sounds.   Respiratory: Normal respiratory effort.  No retractions. Lungs CTAB. Gastrointestinal: Soft and nontender. No distention.  Musculoskeletal: No lower extremity tenderness nor edema. No gross deformities of extremities. Neurologic:  Normal speech and language. No gross focal neurologic deficits are appreciated.  Skin:  Skin is warm, dry and intact. No rash noted. Psychiatric: Mood and affect are normal. Speech and behavior are normal.  ____________________________________________  DIAGNOSTIC STUDIES: Oxygen Saturation is 96% on RA, normal by my interpretation.    COORDINATION OF CARE: 12:20  PM- Pt advised of plan for treatment and pt agrees. Pt will receive lab work and CT for further evaluation. Advised to follow up with her neurosurgeon. Will also provide resources for Neurology follow up for Mckennon Zwart term headache management.    LABS (all labs ordered are listed, but only abnormal results are displayed)  Labs Reviewed  I-STAT CHEM 8, ED - Abnormal; Notable for the following:       Result Value   Creatinine, Ser 1.20 (*)    All other components within normal limits  URINALYSIS, ROUTINE W REFLEX MICROSCOPIC (NOT AT [facility])   ____________________________________________  RADIOLOGY  Ct Head Wo Contrast  Result Date: 11/21/2015 CLINICAL DATA:  Headache for 2 weeks. History of aneurysm 3 years ago with surgery. EXAM: CT HEAD WITHOUT CONTRAST TECHNIQUE: Contiguous axial images were obtained from the base of the skull through the vertex without intravenous contrast. COMPARISON:  10/22/2014 FINDINGS: Changes of prior right frontal craniotomy an aneurysm clipping. No acute intracranial abnormality. Specifically, no hemorrhage, hydrocephalus, mass lesion, acute infarction, or significant intracranial injury. No acute calvarial abnormality. Visualized paranasal sinuses and mastoids clear. Orbital soft tissues unremarkable. IMPRESSION: No acute intracranial abnormality. Electronically Signed  By: Rolm Baptise M.D.   On: 11/21/2015 13:50   ____________________________________________   PROCEDURES  Procedure(s) performed:   Procedures  None ____________________________________________   INITIAL IMPRESSION / ASSESSMENT AND PLAN / ED COURSE  Pertinent labs & imaging results that were available during my care of the patient were reviewed by me and considered in my medical decision making (see chart for details).  Patient with past medical history of brain aneurysm status post repair and chronic headaches presents to the emergency department for evaluation of 2 weeks of intermittent  headache. She reports the course as waxing and waning. She has mild photophobia and no blurry vision. Her neurological exam is nonfocal. She has no tenderness to palpation of her temporal artery distribution. Given the prolonged, waxing and waning course of the patient's pain I have extremely low suspicion for sentinel bleed or SAH. Patient likely has some atypical migraine symptoms or tension headache. Given the atypical presentation for the patient will pain a CT scan of the head to rule out any gross abnormalities or obvious large masses. I do not feel that lumbar puncture or CTA is required at this time. I discussed with the patient at she can discuss additional imaging with her neurosurgeon. We will treat with headache cocktail and IV fluids. We'll send basic labs to rule out underlying electrolyte disorder or sign of infection. I also considered CNS infection and feel this to be much less likely. She has no signs of meningismus and no report of fever.   02:08 PM Patient is feeling much improved after headache cocktail. CT scan and labs largely unremarkable. The patient does have a mild creatinine elevation. Plan for recheck with primary care physician. I discussed conservative management at home with her prescription medications I will provide. Patient keep a headache diary. We'll give the name and phone number of the neurologist for follow-up if headaches continue in this manner. Patient has established neurosurgeon she will also discuss her symptoms with, however, had a detailed discussion with the patient regarding my very low suspicion for subarachnoid hemorrhage. She understands follow-up plan at discharge and is pleased. ____________________________________________  FINAL CLINICAL IMPRESSION(S) / ED DIAGNOSES  Final diagnoses:  Acute nonintractable headache, unspecified headache type   I performed the above HPI, ROS, and PE. I have reviewed and updated the scribes documentation.    MEDICATIONS GIVEN DURING THIS VISIT:  Medications  sodium chloride 0.9 % bolus 1,000 mL (0 mLs Intravenous Stopped 11/21/15 1400)  metoCLOPramide (REGLAN) injection 10 mg (10 mg Intravenous Given 11/21/15 1231)  diphenhydrAMINE (BENADRYL) injection 50 mg (50 mg Intravenous Given 11/21/15 1231)  ketorolac (TORADOL) 30 MG/ML injection 30 mg (30 mg Intravenous Given 11/21/15 1231)     NEW OUTPATIENT MEDICATIONS STARTED DURING THIS VISIT:  Discharge Medication List as of 11/21/2015  2:12 PM    START taking these medications   Details  ibuprofen (ADVIL,MOTRIN) 800 MG tablet Take 1 tablet (800 mg total) by mouth every 8 (eight) hours as needed., Starting Tue 11/21/2015, Print    metoCLOPramide (REGLAN) 10 MG tablet Take 1 tablet (10 mg total) by mouth every 6 (six) hours as needed for nausea., Starting Tue 11/21/2015, Print          Note:  This document was prepared using Dragon voice recognition software and may include unintentional dictation errors.  Nanda Quinton, MD Emergency Medicine    Margette Fast, MD 11/22/15 1028

## 2015-11-21 NOTE — ED Notes (Signed)
EDP at bedside updating patient. 

## 2015-11-24 DIAGNOSIS — R944 Abnormal results of kidney function studies: Secondary | ICD-10-CM | POA: Diagnosis not present

## 2015-11-24 DIAGNOSIS — M542 Cervicalgia: Secondary | ICD-10-CM | POA: Diagnosis not present

## 2015-11-24 DIAGNOSIS — I10 Essential (primary) hypertension: Secondary | ICD-10-CM | POA: Diagnosis not present

## 2015-11-24 DIAGNOSIS — G43909 Migraine, unspecified, not intractable, without status migrainosus: Secondary | ICD-10-CM | POA: Diagnosis not present

## 2015-11-25 DIAGNOSIS — R071 Chest pain on breathing: Secondary | ICD-10-CM | POA: Diagnosis not present

## 2015-12-14 DIAGNOSIS — R9402 Abnormal brain scan: Secondary | ICD-10-CM | POA: Diagnosis not present

## 2015-12-14 DIAGNOSIS — R200 Anesthesia of skin: Secondary | ICD-10-CM | POA: Diagnosis not present

## 2015-12-14 DIAGNOSIS — Z87820 Personal history of traumatic brain injury: Secondary | ICD-10-CM | POA: Diagnosis not present

## 2015-12-14 DIAGNOSIS — Z79899 Other long term (current) drug therapy: Secondary | ICD-10-CM | POA: Diagnosis not present

## 2015-12-14 DIAGNOSIS — R51 Headache: Secondary | ICD-10-CM | POA: Diagnosis not present

## 2015-12-14 DIAGNOSIS — S0990XA Unspecified injury of head, initial encounter: Secondary | ICD-10-CM | POA: Diagnosis not present

## 2015-12-14 DIAGNOSIS — R42 Dizziness and giddiness: Secondary | ICD-10-CM | POA: Diagnosis not present

## 2015-12-14 DIAGNOSIS — K219 Gastro-esophageal reflux disease without esophagitis: Secondary | ICD-10-CM | POA: Diagnosis not present

## 2015-12-14 DIAGNOSIS — M542 Cervicalgia: Secondary | ICD-10-CM | POA: Diagnosis not present

## 2015-12-14 DIAGNOSIS — R930 Abnormal findings on diagnostic imaging of skull and head, not elsewhere classified: Secondary | ICD-10-CM | POA: Diagnosis not present

## 2015-12-14 DIAGNOSIS — Z7982 Long term (current) use of aspirin: Secondary | ICD-10-CM | POA: Diagnosis not present

## 2015-12-14 DIAGNOSIS — I10 Essential (primary) hypertension: Secondary | ICD-10-CM | POA: Diagnosis not present

## 2015-12-14 DIAGNOSIS — E7800 Pure hypercholesterolemia, unspecified: Secondary | ICD-10-CM | POA: Diagnosis not present

## 2015-12-19 ENCOUNTER — Encounter: Payer: Self-pay | Admitting: Family Medicine

## 2015-12-19 ENCOUNTER — Ambulatory Visit (INDEPENDENT_AMBULATORY_CARE_PROVIDER_SITE_OTHER): Payer: BLUE CROSS/BLUE SHIELD | Admitting: Family Medicine

## 2015-12-19 VITALS — BP 129/81 | HR 57 | Temp 98.9°F | Ht 62.0 in | Wt 199.0 lb

## 2015-12-19 DIAGNOSIS — I10 Essential (primary) hypertension: Secondary | ICD-10-CM

## 2015-12-19 DIAGNOSIS — R9389 Abnormal findings on diagnostic imaging of other specified body structures: Secondary | ICD-10-CM

## 2015-12-19 DIAGNOSIS — R51 Headache: Secondary | ICD-10-CM | POA: Diagnosis not present

## 2015-12-19 DIAGNOSIS — R519 Headache, unspecified: Secondary | ICD-10-CM

## 2015-12-19 DIAGNOSIS — R930 Abnormal findings on diagnostic imaging of skull and head, not elsewhere classified: Secondary | ICD-10-CM

## 2015-12-19 DIAGNOSIS — R938 Abnormal findings on diagnostic imaging of other specified body structures: Secondary | ICD-10-CM | POA: Diagnosis not present

## 2015-12-19 DIAGNOSIS — E785 Hyperlipidemia, unspecified: Secondary | ICD-10-CM

## 2015-12-19 NOTE — Addendum Note (Signed)
Addended by: Zannie Cove on: 12/19/2015 02:56 PM   Modules accepted: Orders

## 2015-12-19 NOTE — Progress Notes (Signed)
Subjective:    Patient ID: [name] [name], female    DOB: [birth year], 55 y.o.   MRN: [id number]  HPI Patient here today to establish care. She is moving all of her care over to [facility] facilities.  Her chief concerns today are that her head feels fuzzy and her in the fall and she has headaches about 13 times a month headaches classically wrap around her head and begin in the back and neck. She had a recent MRI as well as CT scan. There are some nonspecific changes on MRI compared to a previous study which raises suspicion of ALS. She is not having symptoms such as muscle weakness or fasciculations. She did have a cerebral aneurysm which was clipped in 2014. Many of her "head" symptoms have been laying down residua of this.  She sees Dr. Luan Pulling pulmonary physician as well as Dr. branch cardiologist. She has been told she has fibromyalgia as well as anxiety. She seems to reject these diagnoses. She had previously been on a multi-pharmacy rest regimen of 13 pills now takes diltiazem hydralazine and metoprolol for blood pressure, loratadine for allergies, albuterol for breathing, and baby aspirin as well as a PPI.     Patient Active Problem List   Diagnosis Date Noted  . Chest tightness or pressure 04/03/2014  . Dizziness 04/03/2014  . Headache 04/03/2014  . Chest pain 04/02/2014  . GERD (gastroesophageal reflux disease) 04/02/2014  . Meningitis, unspecified(322.9) 08/11/2012  . 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 HLD (hyperlipidemia) 01/05/2009  . OVERWEIGHT/OBESITY 01/05/2009  . Essential hypertension 01/05/2009  . Palpitations 01/05/2009  . CHEST PAIN-UNSPECIFIED 01/05/2009   Outpatient Encounter Prescriptions as of 12/19/2015  Medication Sig  . acetaminophen (TYLENOL) 650 MG CR tablet Take 1,300 mg by mouth every 8 (eight) hours as needed for pain or fever.   [name] aspirin 81 MG tablet Take 1 tablet (81 mg total) by mouth daily.  . Cholecalciferol (D 1000) 1000 UNITS  tablet Take 1,000 Units by mouth every evening.   . diltiazem (CARDIZEM LA) 240 MG 24 hr tablet Take 180 mg in the morning and 240 mg in the evening (Patient taking differently: Take 180-240 mg by mouth 2 (two) times daily. Take 180 mg in the morning and 240 mg in the evening)  . hydrALAZINE (APRESOLINE) 50 MG tablet Take 50 mg by mouth 2 (two) times daily.  [name] loratadine (CLARITIN) 10 MG tablet Take 10 mg by mouth daily.   . metoprolol (LOPRESSOR) 50 MG tablet Take 50 mg by mouth 2 (two) times daily.  . pantoprazole (PROTONIX) 40 MG tablet Take 40 mg by mouth daily.  [name] PROAIR HFA 108 (90 Base) MCG/ACT inhaler INHALE 2 PUFF EVERY 4 HOURS AS NEEDED  . [DISCONTINUED] ibuprofen (ADVIL,MOTRIN) 800 MG tablet Take 1 tablet (800 mg total) by mouth every 8 (eight) hours as needed.  . [DISCONTINUED] metoCLOPramide (REGLAN) 10 MG tablet Take 1 tablet (10 mg total) by mouth every 6 (six) hours as needed for nausea.   No facility-administered encounter medications on file as of 12/19/2015.      Review of Systems  Constitutional: Negative.   Eyes: Negative.   Respiratory: Negative.        O2 stats drop at night down near 90 % Some tight chest and cough  Cardiovascular: Negative.        Chest pains in recent past -- stopped cholesterol med  Gastrointestinal: Negative.   Endocrine: Negative.   Genitourinary: Negative.   Musculoskeletal: Negative.  Skin: Negative.   Allergic/Immunologic: Negative.   Neurological: Positive for headaches.       Head feel "fuzzy" and in a "fog"  Hematological: Negative.   Psychiatric/Behavioral: Negative.        Objective:   Physical Exam  Constitutional: She is oriented to person, place, and time. She appears well-developed and well-nourished.  HENT:  Head: Normocephalic.  Eyes: Pupils are equal, round, and reactive to light.  Cardiovascular: Normal rate, regular rhythm and normal heart sounds.   Pulmonary/Chest: Effort normal and breath sounds normal.    Abdominal: Soft.  Musculoskeletal: Normal range of motion.  Neurological: She is alert and oriented to person, place, and time. She has normal reflexes. No cranial nerve deficit. Coordination normal.  Psychiatric: She has a normal mood and affect. Her behavior is normal.   BP 129/81 (BP Location: Right Arm)   Pulse (!) 57   Temp 98.9 F (37.2 C) (Oral)   Ht _0  (1.575 m)   Wt 199 lb (90.3 kg)   LMP 08/25/2012   BMI 36.40 kg/m         Assessment & Plan:  1. Hyperlipidemia Lipids have not been assessed recently. She had formally taken a cholesterol medicine - Lipid panel  2. Essential hypertension Blood pressure is good on multidrug regimen. In talking to her in reviewing notes I do not see why she is taking hydralazine. There is no history of heart failure and has a blood pressure medicine I think side effects mitigate against its use so would like to taper the hydralazine from 50 mg twice a day to 25 twice a day to 25 daily to stop Regarding the mild abnormalities on MRI but I do get a neurologic consultation to address abnormal findings - [id number]

## 2015-12-20 LAB — CMP14+EGFR
ALT: 9 IU/L (ref 0–32)
AST: 14 IU/L (ref 0–40)
Albumin/Globulin Ratio: 1.5 (ref 1.2–2.2)
Albumin: 4.4 g/dL (ref 3.5–5.5)
Alkaline Phosphatase: 69 IU/L (ref 39–117)
BILIRUBIN TOTAL: 0.4 mg/dL (ref 0.0–1.2)
BUN / CREAT RATIO: 11 (ref 9–23)
BUN: 14 mg/dL (ref 6–24)
CHLORIDE: 102 mmol/L (ref 96–106)
CO2: 25 mmol/L (ref 18–29)
Calcium: 9.9 mg/dL (ref 8.7–10.2)
Creatinine, Ser: 1.32 mg/dL — ABNORMAL HIGH (ref 0.57–1.00)
GFR calc non Af Amer: 46 mL/min/{1.73_m2} — ABNORMAL LOW (ref >59)
GFR, EST AFRICAN AMERICAN: 53 mL/min/{1.73_m2} — AB (ref >59)
Globulin, Total: 3 g/dL (ref 1.5–4.5)
Glucose: 93 mg/dL (ref 65–99)
Potassium: 4.4 mmol/L (ref 3.5–5.2)
Sodium: 142 mmol/L (ref 134–144)
TOTAL PROTEIN: 7.4 g/dL (ref 6.0–8.5)

## 2015-12-20 LAB — LIPID PANEL
CHOL/HDL RATIO: 3.6 ratio (ref 0.0–4.4)
Cholesterol, Total: 193 mg/dL (ref 100–199)
HDL: 54 mg/dL (ref >39)
LDL Calculated: 120 mg/dL — ABNORMAL HIGH (ref 0–99)
Triglycerides: 97 mg/dL (ref 0–149)
VLDL CHOLESTEROL CAL: 19 mg/dL (ref 5–40)

## 2015-12-21 ENCOUNTER — Ambulatory Visit (INDEPENDENT_AMBULATORY_CARE_PROVIDER_SITE_OTHER): Payer: BLUE CROSS/BLUE SHIELD | Admitting: Neurology

## 2015-12-21 ENCOUNTER — Encounter: Payer: Self-pay | Admitting: Neurology

## 2015-12-21 VITALS — BP 99/64 | HR 57 | Ht 62.0 in | Wt 203.2 lb

## 2015-12-21 DIAGNOSIS — I10 Essential (primary) hypertension: Secondary | ICD-10-CM

## 2015-12-21 DIAGNOSIS — R51 Headache: Secondary | ICD-10-CM | POA: Diagnosis not present

## 2015-12-21 DIAGNOSIS — R519 Headache, unspecified: Secondary | ICD-10-CM

## 2015-12-21 MED ORDER — AMITRIPTYLINE HCL 50 MG PO TABS: 50.0000 mg | ORAL_TABLET | Freq: Every day | ORAL | 2 refills | Status: DC

## 2015-12-21 MED ORDER — AMITRIPTYLINE HCL 25 MG PO TABS: 25.0000 mg | ORAL_TABLET | Freq: Every day | ORAL | 0 refills | Status: DC

## 2015-12-21 NOTE — Progress Notes (Signed)
NEUROLOGY CLINIC NEW PATIENT NOTE  NAME: [name] [name] DOB: [birth year] REFERRING PHYSICIAN: Wardell Honour, MD  I saw [name] [name] as a new consult in the neurovascular clinic today regarding  Chief Complaint  Patient presents with  . Referral    Internal Referral for abnormal Ct scan. Pt saw Dr. Leta Baptist for headaches in the past and had clipping procedure done with neurosurgery  .  HPI: [name] [name] is a 55 y.o. female with PMH of HTN, HLD, right ICA terminus aneurysm s/p clipping in 2013 and headache who presents as a new patient for Headache.   Patient was following with Dr.penumalli in 2013 for headache. At that time, she complained of intermittent headache, right or left-sided, associated with neck pain, left shoulder pain, nausea, blurred vision, and dizziness. Headaches were proceeded by feeding of sore spots over the scalp. She took Tylenol for headache. However she had MRI and MRA at that time showed 45 mm terminal right ICA aneurysm. MRI C-spine showed minimal degenerative changes without significant spinal stenosis. She underwent aneurysm clipping by Dr. Cyndy Freeze and after that her headache was gone for several years.  However, since 6 months ago, the headache gradually coming back, the headache was very similar to previous headache, starting with lower neck pain, bilateral shoulder pain, up to the back of her head, either left or right-sided with neck tightness. Headache more pressure, dull feeling, sometimes lasting 2-3 days. Tylenol works if takes early. For the last 2 weeks, headache seems getting worse. At that same time he had right hand cramping after overexerting the right hand in a party, as well as sometimes left arm tingling. Went to see PCP, had a stress test which was negative and also had MRI/MRA showed previous aneurysm clipping without change, however, also showed symmetric mild signal along bilateral corticospinal tracts, progressed from 2015. It  reported this finding is nonspecific however may be seen associated with ALS. Due to this abnormal findings, patient was referred here for further evaluation.  She has history of hypertension, on Cardizem, hydralazine, and metoprolol. Recently her blood pressure was on the lower side and her PCP is trying to taper off hydralazine. Today blood pressure 99/60. Questionable history of fibromyalgia. He is also on aspirin daily. Denies any alcohol smoking or using illicit drugs.  Past Medical History:  Diagnosis Date  . Aneurysm ([place])    brain  . Chest pain    denied on 06/18/2012  . Chronic kidney disease    L - upper pole- defect, doesn't effect     . Family history of anesthesia complication    brother & neice "flat lined" during induction: neice d/t a medication given for a blood clotting problem; brother d/t "miscalculated amount of anesthesia"  . GERD (gastroesophageal reflux disease)    h/o colitis   . H. pylori infection    2012- stomach biopsy  . H/O hiatal hernia   . Headache(784.0)    using tylenol prn  . Hyperlipidemia   . Hypertension   . Hypoglycemia   . Normal cardiac stress test    pt. released fr. Dr. [name] care in 10/2011, all findings w/i normal limits  . Overweight(278.02)    Obesity  . Palpitations   . Sinusitis, acute    seen by PCP- 06/17/2012, will treat /w augmentin & tussin/DM  . Uterine fibroid    pt. states that she has a "closed cervix"    Past Surgical History:  Procedure Laterality Date  . COLONOSCOPY    .  CRANIOTOMY Right 06/24/2012   Procedure: CRANIOTOMY INTRACRANIAL ANEURYSM FOR CAROTID;  Surgeon: [name], MD;  Location: [facility];  Service: Neurosurgery;  Laterality: Right;  RIGHT Pterional Craniotomy for aneurysm  . LIPOMA EXCISION     buttock 10/2010   Family History  Problem Relation Age of Onset  . Hypertension Other   . Coronary artery disease Other   . Dementia Mother   . Dementia Maternal Aunt    Current Outpatient  Prescriptions  Medication Sig Dispense Refill  . acetaminophen (TYLENOL) 650 MG CR tablet Take 1,300 mg by mouth every 8 (eight) hours as needed for pain or fever.     [name] aspirin 81 MG tablet Take 1 tablet (81 mg total) by mouth daily. 30 tablet 0  . Cholecalciferol (D 1000) 1000 UNITS tablet Take 1,000 Units by mouth every evening.     . diltiazem (CARDIZEM LA) 240 MG 24 hr tablet Take 180 mg in the morning and 240 mg in the evening (Patient taking differently: Take 180-240 mg by mouth 2 (two) times daily. Take 180 mg in the morning and 240 mg in the evening) 180 tablet 3  . hydrALAZINE (APRESOLINE) 50 MG tablet Take 50 mg by mouth 2 (two) times daily.    [name] loratadine (CLARITIN) 10 MG tablet Take 10 mg by mouth daily.     . metoprolol (LOPRESSOR) 50 MG tablet Take 50 mg by mouth 2 (two) times daily.    . pantoprazole (PROTONIX) 40 MG tablet Take 40 mg by mouth daily.  1  . PROAIR HFA 108 (90 Base) MCG/ACT inhaler INHALE 2 PUFF EVERY 4 HOURS AS NEEDED  1  . amitriptyline (ELAVIL) 25 MG tablet Take 1 tablet (25 mg total) by mouth at bedtime. 7 tablet 0  . [START ON 12/28/2015] amitriptyline (ELAVIL) 50 MG tablet Take 1 tablet (50 mg total) by mouth at bedtime. 30 tablet 2   No current facility-administered medications for this visit.    Allergies  Allergen Reactions  . Zithromax [Azithromycin] Anaphylaxis  . Ace Inhibitors Swelling  . Ciprofloxacin Nausea And Vomiting    Also: kidney failure   . Other Swelling, Other (See Comments) and Cough    Sneezing allergy to cats/grass/dust  . Medrol [Methylprednisolone] Palpitations    Heart racing, increased BP  . Tetracyclines & Related Palpitations    Heart racing, increased bp   Social History   Social History  . Marital status: Divorced    Spouse name: N/A  . Number of children: N/A  . Years of education: N/A   Occupational History  . Full time [employer] History Main Topics  . Smoking status: Former Smoker     Packs/day: 0.80    Years: 15.00    Types: Cigarettes    Start date: 02/13/1989    Quit date: 04/28/2004  . Smokeless tobacco: Never Used  . Alcohol use No  . Drug use: No  . Sexual activity: No   Other Topics Concern  . Not on file   Social History Narrative   Single    Review of Systems Full 14 system review of systems performed and notable only for those listed, all others are neg:  Constitutional:   Cardiovascular:  Ear/Nose/Throat:  Ringing in ears  Skin:  Eyes:   Respiratory:  SOB, cough, snoring  Gastroitestinal:   Genitourinary:  Hematology/Lymphatic:   Endocrine:  Musculoskeletal:   Allergy/Immunology:   Neurological:  Headache, numbness, dizziness  Psychiatric:  Sleep: Snoring  Physical Exam  Vitals:   12/21/15 1616  BP: 99/64  Pulse: (!) 57    General - Well nourished, well developed, in no apparent distress.  Ophthalmologic - Sharp disc margins OU.  Cardiovascular - Regular rate and rhythm with no murmur. Carotid pulses were 2+ without bruits.   Neck - supple, no nuchal rigidity. Mild bilateral occipital nerve tenderness on palpation, R>L.   Mental Status -  Level of arousal and orientation to time, place, and person were intact. Language including expression, naming, repetition, comprehension, reading, and writing was assessed and found intact. Attention span and concentration were normal. Recent and remote memory were intact. Fund of Knowledge was assessed and was intact.  Cranial Nerves II - XII - II - Visual field intact OU. III, IV, VI - Extraocular movements intact. V - Facial sensation intact bilaterally. VII - Facial movement intact bilaterally. VIII - Hearing & vestibular intact bilaterally. X - Palate elevates symmetrically. XI - Chin turning & shoulder shrug intact bilaterally. XII - Tongue protrusion intact.  Motor Strength - The patient's strength was normal in all extremities and pronator drift was absent.  Bulk was normal  and fasciculations were absent.   Motor Tone - Muscle tone was assessed at the neck and appendages and was normal.  Reflexes - The patient's reflexes were normal in all extremities and she had no pathological reflexes.  Sensory - Light touch, temperature/pinprick, vibration and proprioception, and Romberg testing were assessed and were normal.    Coordination - The patient had normal movements in the hands and feet with no ataxia or dysmetria.  Tremor was absent.  Gait and Station - The patient's transfers, posture, gait, station, and turns were observed as normal.   Imaging  I have personally reviewed the radiological images below and agree with the radiology interpretations.  MRI with and without contrast 12/14/2015   no acute intracranial process. Symmetric mild abnormal signal along bilateral cortical spinal tracts, progress from 2015. Nonspecific, though associate with ALS. S/p right carotid terminus aneurysm clipping.  CT head 12/14/2015 Previous aneurysm clipping placement the right MCA region. No intracranial mass, hemorrhage, or extra axial fluid collection. The gray-white compartments appears normal. No acute infarct evident. There is a mild paranasal sinus disease.  CT cervical spine 12/14/2015 No fracture or spondylolisthesis  MRA 01/09/2012 There is a 45 mm terminal right ICA aneurysm. No signal change from MRA head on 06/06/2011.  MRI cervical spine 02/06/2012 Minimal degenerative changes without significant spinal stenosis, cord compression, foraminal narrowing or nerve root compression. The right carotid terminal aneurysm is partially visualized on the present exam. There is not adequately accessed by the present technique. No obvious hemorrhage is noted.  Lab Review None    Assessment and Plan:   In summary, [name] [name] is a 55 y.o. female with PMH of HTN, HLD, right ICA terminus aneurysm s/p clipping in 2013 and headache who presents as a new patient for  Headache. She had a similar headache in 2013, found to have right terminal ICA aneurysm s/p clipping. Headache was gone for several years but recently come back. MRI no acute process except stable right terminal ICA clipping. However found to have symmetric mild signal along bilateral corticospinal tracts, reportedly may associated with ALS. However, on my review of images, the signals are the same as those in her MRI in 2015, no progression. With clinical correlations, this is not ALS but rather normal signal. I have reassured patient.  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 Will hold off occipital nerve block for now, and try amitriptyline and PT/OT first.  - recommend to keep pillow down to shoulder instead of at skull base to ease off symptoms.  - refer to PT/OT for cervicogenic pain - will try amitriptyline QHs for neuralgia.  - will consider nerve block if needed in the future.  - check BP at home and record - follow up with PCP for blood pressure control and medication adjustment.  - follow up in 2 months.   Thank you very much for the opportunity to participate in the care of this patient.  Please do not hesitate to call if any questions or concerns arise.  Orders Placed This Encounter  Procedures  . Ambulatory referral to Physical Therapy    Referral Priority:   Routine    Referral Type:   Physical Medicine    Referral Reason:   Specialty Services Required    Requested Specialty:   Physical Therapy    Number of Visits Requested:   1    Meds ordered this encounter  Medications  . amitriptyline (ELAVIL) 50 MG tablet    Sig: Take 1 tablet (50 mg total) by mouth at bedtime.    Dispense:  30 tablet    Refill:  2  . amitriptyline (ELAVIL) 25 MG tablet    Sig: Take 1 tablet (25 mg total) by mouth at bedtime.    Dispense:  7 tablet    Refill:  0    Patient  Instructions  - recommend to keep pillow down to shoulder instead of at skull base to ease off symptoms.  - refer to PT/OT for cervicogenic pain - will try amitriptyline at night for nerve pain.  - will consider nerve block if needed in the future.  - check BP at home and record - follow up with PCP for blood pressure control and medication adjustment.  - follow up in 2 months.    Rosalin Hawking, MD PhD [facility] [address] [place], [place] [address] [phone]

## 2015-12-21 NOTE — Patient Instructions (Signed)
-   recommend to keep pillow down to shoulder instead of at skull base to ease off symptoms.  - refer to PT/OT for cervicogenic pain - will try amitriptyline at night for nerve pain.  - will consider nerve block if needed in the future.  - check BP at home and record - follow up with PCP for blood pressure control and medication adjustment.  - follow up in 2 months.

## 2015-12-28 DIAGNOSIS — I671 Cerebral aneurysm, nonruptured: Secondary | ICD-10-CM | POA: Diagnosis not present

## 2015-12-28 DIAGNOSIS — M542 Cervicalgia: Secondary | ICD-10-CM | POA: Diagnosis not present

## 2016-01-02 ENCOUNTER — Telehealth: Payer: Self-pay | Admitting: Family Medicine

## 2016-01-02 NOTE — Telephone Encounter (Signed)
Patient aware of lab results.

## 2016-01-04 ENCOUNTER — Encounter: Payer: Self-pay | Admitting: Physical Therapy

## 2016-01-04 ENCOUNTER — Ambulatory Visit: Payer: BLUE CROSS/BLUE SHIELD | Attending: Neurosurgery | Admitting: Physical Therapy

## 2016-01-04 DIAGNOSIS — M542 Cervicalgia: Secondary | ICD-10-CM | POA: Diagnosis not present

## 2016-01-04 DIAGNOSIS — R293 Abnormal posture: Secondary | ICD-10-CM | POA: Diagnosis not present

## 2016-01-04 NOTE — Therapy (Signed)
[facility] [place], [place], [address] Phone: [phone]   Fax:  [phone]  Physical Therapy Evaluation  Patient Details  Name: [name] [name] MRN: [id number] Date of Birth: 06-02-1960 Referring Provider: Ashok Pall MD.  Encounter Date: 01/04/2016      PT End of Session - 01/04/16 1128    Visit Number 1   Number of Visits 12   Date for PT Re-Evaluation 02/15/16   PT Start Time 0901   PT Stop Time 0950   PT Time Calculation (min) 49 min   Activity Tolerance Patient tolerated treatment well   Behavior During Therapy [facility] for tasks assessed/performed      Past Medical History:  Diagnosis Date  . Aneurysm ([place])    brain  . Chest pain    denied on 06/18/2012  . Chronic kidney disease    L - upper pole- defect, doesn't effect     . Family history of anesthesia complication    brother & neice "flat lined" during induction: neice d/t a medication given for a blood clotting problem; brother d/t "miscalculated amount of anesthesia"  . GERD (gastroesophageal reflux disease)    h/o colitis   . H. pylori infection    2012- stomach biopsy  . H/O hiatal hernia   . Headache(784.0)    using tylenol prn  . Hyperlipidemia   . Hypertension   . Hypoglycemia   . Normal cardiac stress test    pt. released fr. Dr. [name] care in 10/2011, all findings w/i normal limits  . Overweight(278.02)    Obesity  . Palpitations   . Sinusitis, acute    seen by PCP- 06/17/2012, will treat /w augmentin & tussin/DM  . Uterine fibroid    pt. states that she has a "closed cervix"     Past Surgical History:  Procedure Laterality Date  . COLONOSCOPY    . CRANIOTOMY Right 06/24/2012   Procedure: CRANIOTOMY INTRACRANIAL ANEURYSM FOR CAROTID;  Surgeon: [name], MD;  Location: [place] [facility];  Service: Neurosurgery;  Laterality: Right;  RIGHT Pterional Craniotomy for aneurysm  . LIPOMA EXCISION     buttock 10/2010    There were no  vitals filed for this visit.       Subjective Assessment - 01/04/16 0909    Subjective The patient reports on/off neck pain over the last year but really continous over the last 6 weeks.  The patient even reports dizziness and nausea.  The patient reports pressure in her head.  Her CC is thta of left side neck pain and into her mid-back as well.  The patient reports she may have Fibromyalgia.  The patient has also been tested fotr lupus and RA.  Pain can rise to a high of 7+/10.   Pertinent History Craniectomy 06/24/14 due to aneurysm.   Limitations --  Sleep impaired.   Patient Stated Goals I want to get out of pain.   Currently in Pain? Yes   Pain Score 4    Pain Location Neck   Pain Orientation Left   Pain Descriptors / Indicators Aching;Nagging;Pressure   Pain Type Chronic pain   Pain Onset More than a month ago   Pain Frequency Constant   Aggravating Factors  Sustained positions.   Pain Relieving Factors Heat.            [facility] PT Assessment - 01/04/16 0001      Assessment   Medical Diagnosis Cervicalgia.   Referring Provider Ashok Pall MD.  Onset Date/Surgical Date --  One year.     Precautions   Precautions None     Restrictions   Weight Bearing Restrictions No     Balance Screen   Has the patient fallen in the past 6 months No   Has the patient had a decrease in activity level because of a fear of falling?  No   Is the patient reluctant to leave their home because of a fear of falling?  No     Home Environment   Living Environment Private residence     Posture/Postural Control   Posture/Postural Control Postural limitations   Postural Limitations Rounded Shoulders;Forward head     ROM / Strength   AROM / PROM / Strength AROM;Strength     AROM   Overall AROM Comments Bilateral active cervical rotation= 50 degrees and bilateral shoulder ROM is full.     Strength   Overall Strength Comments Normal bilateral UE strength.     Palpation   Palpation  comment Tender to palpation over suboccipital region, left UT; middle traps and rhomboids and overpressure at T-7.     Special Tests    Special Tests --  Normal UE DTR's.     Ambulation/Gait   Gait Comments WNL.                   [facility] Adult PT Treatment/Exercise - 01/04/16 0001      Modalities   Modalities Electrical Stimulation;Moist Heat     Moist Heat Therapy   Number Minutes Moist Heat 15 Minutes   Moist Heat Location --  Left upper and middle traps.     Acupuncturist Location Left upper and middle traps.   Electrical Stimulation Action IFC   Electrical Stimulation Parameters 80-150 Hz at 100% scan x 15 minutes.   Electrical Stimulation Goals Pain                     PT Long Term Goals - 01/04/16 1133      PT LONG TERM GOAL #1   Title Independent with an HEP.   Time 6   Period Weeks   Status New     PT LONG TERM GOAL #2   Title Increase active cervical rotation to 70 degrees+ so patient can turn head more easily while driving.   Time 6   Period Weeks   Status New     PT LONG TERM GOAL #3   Title Perform ADL's with pain not > 3/10.   Time 6   Period Weeks   Status New               Plan - 01/04/16 1129    Clinical Impression Statement The patient prsents with difuse bilateral UT and thoraci musculature pain but her left side is much worse.  She presents to limited cervical range of motion and is tender to palpation over his left upper and middle traps and with overpressure at T7.  Strength of bilateral UE are normal and UE DTR's are intact.   Rehab Potential Good   PT Frequency 2x / week   PT Duration 6 weeks   PT Treatment/Interventions ADLs/Self Care Home Management;Cryotherapy;Electrical Stimulation;Ultrasound;Moist Heat;Therapeutic activities;Therapeutic exercise;Patient/family education;Passive range of motion;Manual techniques;Dry needling   PT Next Visit Plan Moist heat and electrical  stimulation; STW/M and [place] to affected cervical and thoracic musculature.  Chin tucks; cervical extension and rotation.  Scapular strengthening exercises.  Patient will benefit from skilled therapeutic intervention in order to improve the following deficits and impairments:  Pain, Decreased activity tolerance, Decreased range of motion  Visit Diagnosis: Cervicalgia - Plan: PT plan of care cert/re-cert  Abnormal posture - Plan: PT plan of care cert/re-cert     Problem List Patient Active Problem List   Diagnosis Date Noted  . Chest tightness or pressure 04/03/2014  . Dizziness 04/03/2014  . Headache 04/03/2014  . Chest pain 04/02/2014  . GERD (gastroesophageal reflux disease) 04/02/2014  . Meningitis, unspecified(322.9) 08/11/2012  . 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 HLD (hyperlipidemia) 01/05/2009  . OVERWEIGHT/OBESITY 01/05/2009  . Essential hypertension 01/05/2009  . Palpitations 01/05/2009  . CHEST PAIN-UNSPECIFIED 01/05/2009    Michaeline Eckersley, [place] MPT 01/04/2016, 11:46 AM  [facility] [address] [place], [place], [address] Phone: [phone]   Fax:  [phone]  Name: [name] [name] MRN: [id number] Date of Birth: 1961-03-09

## 2016-01-11 ENCOUNTER — Ambulatory Visit: Payer: BLUE CROSS/BLUE SHIELD | Admitting: Physical Therapy

## 2016-01-11 ENCOUNTER — Encounter: Payer: Self-pay | Admitting: Physical Therapy

## 2016-01-11 DIAGNOSIS — M542 Cervicalgia: Secondary | ICD-10-CM | POA: Diagnosis not present

## 2016-01-11 DIAGNOSIS — R293 Abnormal posture: Secondary | ICD-10-CM

## 2016-01-11 NOTE — Therapy (Signed)
[facility] [place], [place], [address] Phone: [phone]   Fax:  [phone]  Physical Therapy Treatment  Patient Details  Name: [name] [name] MRN: [id number] Date of Birth: 1960/06/07 Referring Provider: Ashok Pall MD.  Encounter Date: 01/11/2016      PT End of Session - 01/11/16 1520    Visit Number 2   Number of Visits 12   Date for PT Re-Evaluation 02/15/16   PT Start Time 1524   PT Stop Time 1613   PT Time Calculation (min) 49 min   Activity Tolerance Patient tolerated treatment well   Behavior During Therapy [facility] for tasks assessed/performed      Past Medical History:  Diagnosis Date  . 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 Chest pain    denied on 06/18/2012  . Chronic kidney disease    L - upper pole- defect, doesn't effect     . Family history of anesthesia complication    brother & neice "flat lined" during induction: neice d/t a medication given for a blood clotting problem; brother d/t "miscalculated amount of anesthesia"  . GERD (gastroesophageal reflux disease)    h/o colitis   . H. pylori infection    2012- stomach biopsy  . H/O hiatal hernia   . Headache(784.0)    using tylenol prn  . Hyperlipidemia   . Hypertension   . Hypoglycemia   . Normal cardiac stress test    pt. released fr. Dr. [name] care in 10/2011, all findings w/i normal limits  . Overweight(278.02)    Obesity  . Palpitations   . Sinusitis, acute    seen by PCP- 06/17/2012, will treat /w augmentin & tussin/DM  . Uterine fibroid    pt. states that she has a "closed cervix"     Past Surgical History:  Procedure Laterality Date  . COLONOSCOPY    . CRANIOTOMY Right 06/24/2012   Procedure: CRANIOTOMY INTRACRANIAL ANEURYSM FOR CAROTID;  Surgeon: [name], MD;  Location: [place] [facility];  Service: Neurosurgery;  Laterality: Right;  RIGHT Pterional Craniotomy for aneurysm  . LIPOMA EXCISION     buttock 10/2010    There were no  vitals filed for this visit.      Subjective Assessment - 01/11/16 1520    Subjective States that her neck "has its moments." Reports no headache today but has experienced intense chest pain with normal vital signs as she checked them.   Pertinent History Craniectomy 06/24/14 due to aneurysm.   Patient Stated Goals I want to get out of pain.   Currently in Pain? Yes   Pain Score 5    Pain Location Neck   Pain Orientation Right;Left   Pain Type Chronic pain   Pain Radiating Towards down into thoracic musculature   Pain Onset More than a month ago            [facility] PT Assessment - 01/11/16 0001      Assessment   Medical Diagnosis Cervicalgia.     Precautions   Precautions None     Restrictions   Weight Bearing Restrictions No                     [facility] Adult PT Treatment/Exercise - 01/11/16 0001      Modalities   Modalities Electrical Stimulation;Moist Heat;Ultrasound     Moist Heat Therapy   Number Minutes Moist Heat 15 Minutes   Moist Heat Location Cervical;Shoulder  Acupuncturist Location B upper to mid trap   Chartered certified accountant IFC   Electrical Stimulation Parameters 1-10 hz x15 min   Electrical Stimulation Goals Pain;Tone     Ultrasound   Ultrasound Location B UT   Ultrasound Parameters 1.5 w/cm2, 100%, 1 mhz x10 min   Ultrasound Goals Pain     Manual Therapy   Manual Therapy Myofascial release   Myofascial Release MFR/TPR to B UT/Levator Scapula/ scapular retractors to decrease                      PT Long Term Goals - 01/04/16 1133      PT LONG TERM GOAL #1   Title Independent with an HEP.   Time 6   Period Weeks   Status New     PT LONG TERM GOAL #2   Title Increase active cervical rotation to 70 degrees+ so patient can turn head more easily while driving.   Time 6   Period Weeks   Status New     PT LONG TERM GOAL #3   Title Perform ADL's with pain not > 3/10.   Time  6   Period Weeks   Status New               Plan - 01/11/16 1706    Clinical Impression Statement Patient presented in clinic today with reports of tightness in UT around C7 protuberance with pain that radiated into thoracic musculature and lateral rib region. Patient presented with tightness of B UT especially around C7 protuberance. Patient was educated regarding posture during treatment and accepted new HEP for UT and Levator Scapula stretches as well as chin tucks and scapular squeeze for postural strengthening. Normal modalities response noted following removal of the modalities. Goals remain in-going secondary to this being patient's second visit to clinic. Patient educated that she may experience soreness following manual therapy and to use moist heat for 15- 20 minutes at a time.   Rehab Potential Good   PT Frequency 2x / week   PT Duration 6 weeks   PT Treatment/Interventions ADLs/Self Care Home Management;Cryotherapy;Electrical Stimulation;Ultrasound;Moist Heat;Therapeutic activities;Therapeutic exercise;Patient/family education;Passive range of motion;Manual techniques;Dry needling   PT Next Visit Plan Continue manual therapy and modalities as symptoms dictate per MPT POC.    PT Home Exercise Plan HEP- UT and Levator Scapula stretches, chin tucks, scapular squeeze   Consulted and Agree with Plan of Care Patient      Patient will benefit from skilled therapeutic intervention in order to improve the following deficits and impairments:  Pain, Decreased activity tolerance, Decreased range of motion  Visit Diagnosis: Cervicalgia  Abnormal posture     Problem List Patient Active Problem List   Diagnosis Date Noted  . Chest tightness or pressure 04/03/2014  . Dizziness 04/03/2014  . Headache 04/03/2014  . Chest pain 04/02/2014  . GERD (gastroesophageal reflux disease) 04/02/2014  . Meningitis, unspecified(322.9) 08/11/2012  . 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 HLD (hyperlipidemia) 01/05/2009  . OVERWEIGHT/OBESITY 01/05/2009  . Essential hypertension 01/05/2009  . Palpitations 01/05/2009  . CHEST PAIN-UNSPECIFIED 01/05/2009    [name], PTA 01/11/2016, 5:33 PM  [facility] [address] [place], [place], [address] Phone: [phone]   Fax:  [phone]  Name: [name] [name] MRN: [id number] Date of Birth: May 19, 1960

## 2016-01-16 ENCOUNTER — Ambulatory Visit: Payer: BLUE CROSS/BLUE SHIELD | Admitting: Physical Therapy

## 2016-01-16 DIAGNOSIS — R293 Abnormal posture: Secondary | ICD-10-CM

## 2016-01-16 DIAGNOSIS — M542 Cervicalgia: Secondary | ICD-10-CM | POA: Diagnosis not present

## 2016-01-16 NOTE — Therapy (Signed)
[facility] [place], [place], [address] Phone: [phone]   Fax:  [phone]  Physical Therapy Treatment  Patient Details  Name: [name] [name] MRN: [id number] Date of Birth: [birth year] Referring Provider: Ashok Pall MD.  Encounter Date: 01/16/2016      PT End of Session - 01/16/16 1738    Visit Number 3   Number of Visits 12   Date for PT Re-Evaluation 02/15/16   PT Start Time 0403   PT Stop Time 0450   PT Time Calculation (min) 47 min   Activity Tolerance Patient tolerated treatment well   Behavior During Therapy [facility] for tasks assessed/performed      Past Medical History:  Diagnosis Date  . 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 Chest pain    denied on 06/18/2012  . Chronic kidney disease    L - upper pole- defect, doesn't effect     . Family history of anesthesia complication    brother & neice "flat lined" during induction: neice d/t a medication given for a blood clotting problem; brother d/t "miscalculated amount of anesthesia"  . GERD (gastroesophageal reflux disease)    h/o colitis   . H. pylori infection    2012- stomach biopsy  . H/O hiatal hernia   . Headache(784.0)    using tylenol prn  . Hyperlipidemia   . Hypertension   . Hypoglycemia   . Normal cardiac stress test    pt. released fr. Dr. [name] care in 10/2011, all findings w/i normal limits  . Overweight(278.02)    Obesity  . Palpitations   . Sinusitis, acute    seen by PCP- 06/17/2012, will treat /w augmentin & tussin/DM  . Uterine fibroid    pt. states that she has a "closed cervix"     Past Surgical History:  Procedure Laterality Date  . COLONOSCOPY    . CRANIOTOMY Right 06/24/2012   Procedure: CRANIOTOMY INTRACRANIAL ANEURYSM FOR CAROTID;  Surgeon: [name], MD;  Location: [place] [facility];  Service: Neurosurgery;  Laterality: Right;  RIGHT Pterional Craniotomy for aneurysm  . LIPOMA EXCISION     buttock 10/2010    There were no  vitals filed for this visit.      Subjective Assessment - 01/16/16 1739    Subjective Not doing too bad today.   Pertinent History     Pain Score 5    Pain Location Neck   Pain Orientation Right;Left   Pain Descriptors / Indicators Aching;Nagging;Pressure   Pain Type Chronic pain   Pain Onset More than a month ago                         [facility] Adult PT Treatment/Exercise - 01/16/16 0001      Moist Heat Therapy   Number Minutes Moist Heat 15 Minutes   Moist Heat Location Shoulder;Cervical     Electrical Stimulation   Electrical Stimulation Location Left C-T spine.   Electrical Stimulation Action IFC   Electrical Stimulation Parameters 80-150 Hz x 15 minutes.     Manual Therapy   Manual Therapy Soft tissue mobilization   Soft tissue mobilization In prone:  STW/M to affected Cervical and thoracic musculature and gentle PA mobs to thoracic region and costovertebral joints x 23 minutes.                     PT Long Term Goals - 01/04/16 1133  PT LONG TERM GOAL #1   Title Independent with an HEP.   Time 6   Period Weeks   Status New     PT LONG TERM GOAL #2   Title Increase active cervical rotation to 70 degrees+ so patient can turn head more easily while driving.   Time 6   Period Weeks   Status New     PT LONG TERM GOAL #3   Title Perform ADL's with pain not > 3/10.   Time 6   Period Weeks   Status New             Patient will benefit from skilled therapeutic intervention in order to improve the following deficits and impairments:  Pain, Decreased activity tolerance, Decreased range of motion  Visit Diagnosis: Cervicalgia  Abnormal posture     Problem List Patient Active Problem List   Diagnosis Date Noted  . Chest tightness or pressure 04/03/2014  . Dizziness 04/03/2014  . Headache 04/03/2014  . Chest pain 04/02/2014  . GERD (gastroesophageal reflux disease) 04/02/2014  . Meningitis, unspecified(322.9)  08/11/2012  . 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 HLD (hyperlipidemia) 01/05/2009  . OVERWEIGHT/OBESITY 01/05/2009  . Essential hypertension 01/05/2009  . Palpitations 01/05/2009  . CHEST PAIN-UNSPECIFIED 01/05/2009    [name] [name], [name] [name] 01/16/2016, 5:43 PM  [facility] [address] [place], [place], [address] Phone: [phone]   Fax:  [phone]  Name: [name] [name] MRN: [id number] Date of Birth: [birth year]

## 2016-01-19 DIAGNOSIS — Z1231 Encounter for screening mammogram for malignant neoplasm of breast: Secondary | ICD-10-CM | POA: Diagnosis not present

## 2016-01-23 ENCOUNTER — Ambulatory Visit: Payer: BLUE CROSS/BLUE SHIELD | Admitting: Physical Therapy

## 2016-01-23 ENCOUNTER — Encounter: Payer: Self-pay | Admitting: Physical Therapy

## 2016-01-23 DIAGNOSIS — M542 Cervicalgia: Secondary | ICD-10-CM | POA: Diagnosis not present

## 2016-01-23 DIAGNOSIS — R293 Abnormal posture: Secondary | ICD-10-CM

## 2016-01-23 NOTE — Therapy (Addendum)
[facility] [place], [place], [address] Phone: [phone]   Fax:  [phone]  Physical Therapy Treatment  Patient Details  Name: [name] [name] MRN: [id number] Date of Birth: 1960-09-13 Referring Provider: Ashok Pall MD.  Encounter Date: 01/23/2016      PT End of Session - 01/23/16 1614    Visit Number 4   Number of Visits 12   Date for PT Re-Evaluation 02/15/16   PT Start Time [id number]   PT Stop Time 1702   PT Time Calculation (min) 48 min   Activity Tolerance Patient tolerated treatment well   Behavior During Therapy [facility] for tasks assessed/performed      Past Medical History:  Diagnosis Date  . 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 Chest pain    denied on 06/18/2012  . Chronic kidney disease    L - upper pole- defect, doesn't effect     . Family history of anesthesia complication    brother & neice "flat lined" during induction: neice d/t a medication given for a blood clotting problem; brother d/t "miscalculated amount of anesthesia"  . GERD (gastroesophageal reflux disease)    h/o colitis   . H. pylori infection    2012- stomach biopsy  . H/O hiatal hernia   . Headache(784.0)    using tylenol prn  . Hyperlipidemia   . Hypertension   . Hypoglycemia   . Normal cardiac stress test    pt. released fr. Dr. [name] care in 10/2011, all findings w/i normal limits  . Overweight(278.02)    Obesity  . Palpitations   . Sinusitis, acute    seen by PCP- 06/17/2012, will treat /w augmentin & tussin/DM  . Uterine fibroid    pt. states that she has a "closed cervix"     Past Surgical History:  Procedure Laterality Date  . COLONOSCOPY    . CRANIOTOMY Right 06/24/2012   Procedure: CRANIOTOMY INTRACRANIAL ANEURYSM FOR CAROTID;  Surgeon: [name], MD;  Location: [place] [facility];  Service: Neurosurgery;  Laterality: Right;  RIGHT Pterional Craniotomy for aneurysm  . LIPOMA EXCISION     buttock 10/2010    There were no  vitals filed for this visit.      Subjective Assessment - 01/23/16 1613    Subjective Reports that she had a reaction to Biofreeze with increased eye drainage and sneezing. Patient reported an incident recently with R pointer finger that was hard to describe for patient.   Patient Stated Goals I want to get out of pain.   Currently in Pain? Yes   Pain Score --  Gave no numerical rating   Pain Location Neck   Pain Orientation Right   Pain Descriptors / Indicators Discomfort;Tightness   Pain Type Chronic pain            [facility] PT Assessment - 01/23/16 0001      Assessment   Medical Diagnosis Cervicalgia.     Precautions   Precautions None     Restrictions   Weight Bearing Restrictions No                     [facility] Adult PT Treatment/Exercise - 01/23/16 0001      Modalities   Modalities Electrical Stimulation;Moist Heat;Ultrasound     Moist Heat Therapy   Number Minutes Moist Heat 15 Minutes   Moist Heat Location Shoulder;Cervical     Electrical Stimulation   Electrical Stimulation Location B UT/ mid trap  Electrical Stimulation Action IFC   Electrical Stimulation Parameters 80-150 hz x15 min   Electrical Stimulation Goals Pain;Tone     Ultrasound   Ultrasound Location B UT   Ultrasound Parameters 1.5 w/cm2, 100%, 1 mhz x10 min   Ultrasound Goals Pain     Manual Therapy   Manual Therapy Myofascial release   Myofascial Release MFR/TPR to B UT/Levator Scapula to decrease tightness present                     PT Long Term Goals - 01/04/16 1133      PT LONG TERM GOAL #1   Title Independent with an HEP.   Time 6   Period Weeks   Status New     PT LONG TERM GOAL #2   Title Increase active cervical rotation to 70 degrees+ so patient can turn head more easily while driving.   Time 6   Period Weeks   Status New     PT LONG TERM GOAL #3   Title Perform ADL's with pain not > 3/10.   Time 6   Period Weeks   Status New                Plan - 01/23/16 1741    Clinical Impression Statement Patient presented in clinic again today with tightness reported of cervical musculature. Patient experienced greater discomfort and tightness with R UT/ levator scapula with manual therapy today than with L cervical musculature. Tightness was predominately along the medial fibers of B UT/ Levator Scapula today upon palpation. Normal modalities response noted following removal of the modalities.   Rehab Potential Good   PT Frequency 2x / week   PT Duration 6 weeks   PT Treatment/Interventions ADLs/Self Care Home Management;Cryotherapy;Electrical Stimulation;Ultrasound;Moist Heat;Therapeutic activities;Therapeutic exercise;Patient/family education;Passive range of motion;Manual techniques;Dry needling   PT Next Visit Plan Continue manual therapy and modalities as symptoms dictate per MPT POC.    PT Home Exercise Plan HEP- UT and Levator Scapula stretches, chin tucks, scapular squeeze   Consulted and Agree with Plan of Care Patient      Patient will benefit from skilled therapeutic intervention in order to improve the following deficits and impairments:  Pain, Decreased activity tolerance, Decreased range of motion  Visit Diagnosis: Cervicalgia  Abnormal posture     Problem List Patient Active Problem List   Diagnosis Date Noted  . Chest tightness or pressure 04/03/2014  . Dizziness 04/03/2014  . Headache 04/03/2014  . Chest pain 04/02/2014  . GERD (gastroesophageal reflux disease) 04/02/2014  . Meningitis, unspecified(322.9) 08/11/2012  . 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 HLD (hyperlipidemia) 01/05/2009  . OVERWEIGHT/OBESITY 01/05/2009  . Essential hypertension 01/05/2009  . Palpitations 01/05/2009  . CHEST PAIN-UNSPECIFIED 01/05/2009    [name], PTA 01/23/2016, 6:11 PM  [facility] [address] [place], [place], [address] Phone:  [phone]   Fax:  [phone]  Name: [name] [name] MRN: [id number] Date of Birth: [birth year]

## 2016-01-24 NOTE — Progress Notes (Signed)
Subjective:    Patient ID: [name] [name], female    DOB: 15-Jul-1960, 55 y.o.   MRN: [id number]  HPI 55 year old female who seems to be doing a lot better. She was referred to a neurologist who found no abnormal findings on her repeat MRI. There was some suggestion of possible MS. He did start her on low-dose amitriptyline. She was also started in physical therapy and has had treatment 3 treatments thus far. We have stopped some of her medicines and stopped to more today. She is taking lovastatin borderline lipids that when CV risk assessment formula was applied she is in a 2% risk group and does not need statin. We are also tapering her off the hydralazine. Her blood pressure today is 101/62 and she currently takes metoprolol as well as diltiazem.  Patient Active Problem List   Diagnosis Date Noted  . Chest tightness or pressure 04/03/2014  . Dizziness 04/03/2014  . Headache 04/03/2014  . Chest pain 04/02/2014  . GERD (gastroesophageal reflux disease) 04/02/2014  . Meningitis, unspecified(322.9) 08/11/2012  . 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 HLD (hyperlipidemia) 01/05/2009  . OVERWEIGHT/OBESITY 01/05/2009  . Essential hypertension 01/05/2009  . Palpitations 01/05/2009  . CHEST PAIN-UNSPECIFIED 01/05/2009   Outpatient Encounter Prescriptions as of 01/25/2016  Medication Sig  . acetaminophen (TYLENOL) 650 MG CR tablet Take 1,300 mg by mouth every 8 (eight) hours as needed for pain or fever.   [name] aspirin 81 MG tablet Take 1 tablet (81 mg total) by mouth daily.  . Cholecalciferol (D 1000) 1000 UNITS tablet Take 1,000 Units by mouth every evening.   . diltiazem (CARDIZEM LA) 240 MG 24 hr tablet Take 180 mg in the morning and 240 mg in the evening (Patient taking differently: Take 180-240 mg by mouth 2 (two) times daily. Take 180 mg in the morning and 240 mg in the evening)  . hydrALAZINE (APRESOLINE) 50 MG tablet Take 50 mg by mouth 2 (two) times daily.  [name] loratadine  (CLARITIN) 10 MG tablet Take 10 mg by mouth daily.   [name] lovastatin (MEVACOR) 10 MG tablet Take 10 mg by mouth daily.  . metoprolol (LOPRESSOR) 50 MG tablet Take 50 mg by mouth 2 (two) times daily.  . pantoprazole (PROTONIX) 40 MG tablet Take 40 mg by mouth daily.  [name] PROAIR HFA 108 (90 Base) MCG/ACT inhaler INHALE 2 PUFF EVERY 4 HOURS AS NEEDED  . amitriptyline (ELAVIL) 25 MG tablet Take 1 tablet (25 mg total) by mouth at bedtime. (Patient not taking: Reported on 01/25/2016)  . amitriptyline (ELAVIL) 50 MG tablet Take 1 tablet (50 mg total) by mouth at bedtime. (Patient not taking: Reported on 01/25/2016)   No facility-administered encounter medications on file as of 01/25/2016.       Review of Systems  Constitutional: Negative.   Respiratory: Negative.   Cardiovascular: Negative.   Gastrointestinal: Negative.   Endocrine: Negative.   Genitourinary: Negative.   Neurological: Negative.   Psychiatric/Behavioral: Negative.        Objective:   Physical Exam  Constitutional: She is oriented to person, place, and time. She appears well-developed and well-nourished.  Cardiovascular: Normal rate, regular rhythm and normal heart sounds.   Pulmonary/Chest: Effort normal and breath sounds normal.  Neurological: She is alert and oriented to person, place, and time.  Psychiatric: She has a normal mood and affect.   BP 101/62   Pulse 62   Temp 97.5 F (36.4 C) (Oral)   Ht 5\' 2"  (1.575 m)  Wt 202 lb (91.6 kg)   LMP 08/25/2012   BMI 36.95 kg/m         Assessment & Plan:  1. Essential hypertension Blood pressure is well controlled. Continue to back off hydralazine and then I would probably recommend a dose reduction of the  diltiazem as well  2. HLD (hyperlipidemia) Would discontinue lovastatin and follow lipids. As noted above based on risk assessment she does not need to be on statin and this may indeed help her aches and pains.  Wardell Honour MD

## 2016-01-25 ENCOUNTER — Encounter: Payer: Self-pay | Admitting: Family Medicine

## 2016-01-25 ENCOUNTER — Ambulatory Visit (INDEPENDENT_AMBULATORY_CARE_PROVIDER_SITE_OTHER): Payer: BLUE CROSS/BLUE SHIELD | Admitting: Family Medicine

## 2016-01-25 VITALS — BP 101/62 | HR 62 | Temp 97.5°F | Ht 62.0 in | Wt 202.0 lb

## 2016-01-25 DIAGNOSIS — I10 Essential (primary) hypertension: Secondary | ICD-10-CM | POA: Diagnosis not present

## 2016-01-25 DIAGNOSIS — I671 Cerebral aneurysm, nonruptured: Secondary | ICD-10-CM | POA: Diagnosis not present

## 2016-01-25 DIAGNOSIS — M542 Cervicalgia: Secondary | ICD-10-CM | POA: Diagnosis not present

## 2016-01-25 DIAGNOSIS — E785 Hyperlipidemia, unspecified: Secondary | ICD-10-CM

## 2016-01-31 ENCOUNTER — Other Ambulatory Visit: Payer: Self-pay | Admitting: Cardiology

## 2016-02-02 ENCOUNTER — Ambulatory Visit: Payer: BLUE CROSS/BLUE SHIELD | Attending: Neurosurgery | Admitting: Physical Therapy

## 2016-02-02 DIAGNOSIS — R293 Abnormal posture: Secondary | ICD-10-CM | POA: Insufficient documentation

## 2016-02-02 DIAGNOSIS — M542 Cervicalgia: Secondary | ICD-10-CM | POA: Diagnosis not present

## 2016-02-02 NOTE — Therapy (Signed)
[facility] [place], [place], [address] Phone: [phone]   Fax:  [phone]  Physical Therapy Treatment  Patient Details  Name: [name] [name] MRN: [id number] Date of Birth: Sep 09, 1960 Referring Provider: Ashok Pall MD.  Encounter Date: 02/02/2016      PT End of Session - 02/02/16 0823    Visit Number 5   Number of Visits 12   Date for PT Re-Evaluation 02/15/16   PT Start Time 0831   PT Stop Time 0917   PT Time Calculation (min) 46 min   Activity Tolerance Patient tolerated treatment well   Behavior During Therapy [facility] for tasks assessed/performed      Past Medical History:  Diagnosis Date  . Aneurysm ([place])    brain  . Chest pain    denied on 06/18/2012  . Chronic kidney disease    L - upper pole- defect, doesn't effect     . Family history of anesthesia complication    brother & neice "flat lined" during induction: neice d/t a medication given for a blood clotting problem; brother d/t "miscalculated amount of anesthesia"  . GERD (gastroesophageal reflux disease)    h/o colitis   . H. pylori infection    2012- stomach biopsy  . H/O hiatal hernia   . Headache(784.0)    using tylenol prn  . Hyperlipidemia   . Hypertension   . Hypoglycemia   . Normal cardiac stress test    pt. released fr. Dr. [name] care in 10/2011, all findings w/i normal limits  . Overweight(278.02)    Obesity  . Palpitations   . Sinusitis, acute    seen by PCP- 06/17/2012, will treat /w augmentin & tussin/DM  . Uterine fibroid    pt. states that she has a "closed cervix"     Past Surgical History:  Procedure Laterality Date  . COLONOSCOPY    . CRANIOTOMY Right 06/24/2012   Procedure: CRANIOTOMY INTRACRANIAL ANEURYSM FOR CAROTID;  Surgeon: [name], MD;  Location: [facility];  Service: Neurosurgery;  Laterality: Right;  RIGHT Pterional Craniotomy for aneurysm  . LIPOMA EXCISION     buttock 10/2010    There were no  vitals filed for this visit.      Subjective Assessment - 02/02/16 0823    Subjective Reports that her neck is loosening but still having pain. Reports no current pain or headache. Reports soreness in BUE in hands specifically and around ribcage. Patient reports R pointer finger began moving on its on at work recently and Art therapist saw it as well.   Patient Stated Goals I want to get out of pain.   Currently in Pain? No/denies            [facility] PT Assessment - 02/02/16 0001      Assessment   Medical Diagnosis Cervicalgia.     Precautions   Precautions None     Restrictions   Weight Bearing Restrictions No                     [facility] Adult PT Treatment/Exercise - 02/02/16 0001      Modalities   Modalities Electrical Stimulation;Moist Heat;Ultrasound     Moist Heat Therapy   Number Minutes Moist Heat 15 Minutes   Moist Heat Location Cervical     Electrical Stimulation   Electrical Stimulation Location B UT   Electrical Stimulation Action 2 channels: Pre-Mod   Electrical Stimulation Parameters 80-150 hz x15 min  Electrical Stimulation Goals Pain;Tone     Ultrasound   Ultrasound Location B UT   Ultrasound Parameters 1.5 w/cm2 ,100%, 1 mhz x10 min   Ultrasound Goals Pain     Manual Therapy   Manual Therapy Myofascial release   Myofascial Release MFR/TPR to L UT/Levator Scapula/mid trap to decrease tightness present                     PT Long Term Goals - 01/04/16 1133      PT LONG TERM GOAL #1   Title Independent with an HEP.   Time 6   Period Weeks   Status New     PT LONG TERM GOAL #2   Title Increase active cervical rotation to 70 degrees+ so patient can turn head more easily while driving.   Time 6   Period Weeks   Status New     PT LONG TERM GOAL #3   Title Perform ADL's with pain not > 3/10.   Time 6   Period Weeks   Status New               Plan - 02/02/16 0910    Clinical Impression Statement Patient  presented in clinic with continued L UT/ mid trap pain and tightness. Patient continues to experience uncoordinated movements at intermittant times per patient report. Tightness noted especially in L UT/mid trap/ levator scapula today with one instance of sensitivity that could not be recreated. Normal modalities response noted following removal of the modalities.    Rehab Potential Good   PT Frequency 2x / week   PT Duration 6 weeks   PT Treatment/Interventions ADLs/Self Care Home Management;Cryotherapy;Electrical Stimulation;Ultrasound;Moist Heat;Therapeutic activities;Therapeutic exercise;Patient/family education;Passive range of motion;Manual techniques;Dry needling   PT Next Visit Plan Continue manual therapy and modalities as symptoms dictate per MPT POC.    PT Home Exercise Plan HEP- UT and Levator Scapula stretches, chin tucks, scapular squeeze   Consulted and Agree with Plan of Care Patient      Patient will benefit from skilled therapeutic intervention in order to improve the following deficits and impairments:  Pain, Decreased activity tolerance, Decreased range of motion  Visit Diagnosis: Cervicalgia  Abnormal posture     Problem List Patient Active Problem List   Diagnosis Date Noted  . Chest tightness or pressure 04/03/2014  . Dizziness 04/03/2014  . Headache 04/03/2014  . Chest pain 04/02/2014  . GERD (gastroesophageal reflux disease) 04/02/2014  . Meningitis, unspecified(322.9) 08/11/2012  . 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 HLD (hyperlipidemia) 01/05/2009  . OVERWEIGHT/OBESITY 01/05/2009  . Essential hypertension 01/05/2009  . Palpitations 01/05/2009  . CHEST PAIN-UNSPECIFIED 01/05/2009    [name], PTA 02/02/2016, 9:21 AM  [facility] [place], [place], [address] Phone: [phone]   Fax:  [phone]  Name: [name] [name] MRN: [id number] Date of Birth: Nov 21, 1960

## 2016-02-05 ENCOUNTER — Encounter: Payer: Self-pay | Admitting: Cardiology

## 2016-02-05 DIAGNOSIS — E7800 Pure hypercholesterolemia, unspecified: Secondary | ICD-10-CM | POA: Diagnosis not present

## 2016-02-05 DIAGNOSIS — I10 Essential (primary) hypertension: Secondary | ICD-10-CM | POA: Diagnosis not present

## 2016-02-05 DIAGNOSIS — Z79899 Other long term (current) drug therapy: Secondary | ICD-10-CM | POA: Diagnosis not present

## 2016-02-05 DIAGNOSIS — Z87891 Personal history of nicotine dependence: Secondary | ICD-10-CM | POA: Diagnosis not present

## 2016-02-05 DIAGNOSIS — R079 Chest pain, unspecified: Secondary | ICD-10-CM | POA: Diagnosis not present

## 2016-02-05 DIAGNOSIS — K219 Gastro-esophageal reflux disease without esophagitis: Secondary | ICD-10-CM | POA: Diagnosis not present

## 2016-02-05 DIAGNOSIS — R0789 Other chest pain: Secondary | ICD-10-CM | POA: Diagnosis not present

## 2016-02-05 DIAGNOSIS — Z7982 Long term (current) use of aspirin: Secondary | ICD-10-CM | POA: Diagnosis not present

## 2016-02-08 ENCOUNTER — Ambulatory Visit: Payer: BLUE CROSS/BLUE SHIELD | Admitting: Physical Therapy

## 2016-02-08 ENCOUNTER — Encounter: Payer: Self-pay | Admitting: Physical Therapy

## 2016-02-08 DIAGNOSIS — M542 Cervicalgia: Secondary | ICD-10-CM | POA: Diagnosis not present

## 2016-02-08 DIAGNOSIS — R293 Abnormal posture: Secondary | ICD-10-CM

## 2016-02-08 NOTE — Therapy (Signed)
[facility] [place], [place], [address] Phone: [phone]   Fax:  [phone]  Physical Therapy Treatment  Patient Details  Name: [name] [name] MRN: [id number] Date of Birth: 16-Oct-1960 Referring Provider: Ashok Pall MD.  Encounter Date: 02/08/2016      PT End of Session - 02/08/16 0839    Visit Number 6   Number of Visits 12   Date for PT Re-Evaluation 02/15/16   PT Start Time 0824   PT Stop Time 0905   PT Time Calculation (min) 41 min   Activity Tolerance Patient tolerated treatment well   Behavior During Therapy [facility] for tasks assessed/performed      Past Medical History:  Diagnosis Date  . Aneurysm ([place])    brain  . Chest pain    denied on 06/18/2012  . Chronic kidney disease    L - upper pole- defect, doesn't effect     . Family history of anesthesia complication    brother & neice "flat lined" during induction: neice d/t a medication given for a blood clotting problem; brother d/t "miscalculated amount of anesthesia"  . GERD (gastroesophageal reflux disease)    h/o colitis   . H. pylori infection    2012- stomach biopsy  . H/O hiatal hernia   . Headache(784.0)    using tylenol prn  . Hyperlipidemia   . Hypertension   . Hypoglycemia   . Normal cardiac stress test    pt. released fr. Dr. [name] care in 10/2011, all findings w/i normal limits  . Overweight(278.02)    Obesity  . Palpitations   . Sinusitis, acute    seen by PCP- 06/17/2012, will treat /w augmentin & tussin/DM  . Uterine fibroid    pt. states that she has a "closed cervix"     Past Surgical History:  Procedure Laterality Date  . COLONOSCOPY    . CRANIOTOMY Right 06/24/2012   Procedure: CRANIOTOMY INTRACRANIAL ANEURYSM FOR CAROTID;  Surgeon: [name], MD;  Location: [place] [facility];  Service: Neurosurgery;  Laterality: Right;  RIGHT Pterional Craniotomy for aneurysm  . LIPOMA EXCISION     buttock 10/2010    There were no  vitals filed for this visit.      Subjective Assessment - 02/08/16 0838    Subjective Reports that she doesn't have pain this morning and that she felt good this morning. Went to ER this weekend and was diagnosis with chest wall pain.   Patient Stated Goals I want to get out of pain.   Currently in Pain? No/denies                         [facility] Adult PT Treatment/Exercise - 02/08/16 0001      Exercises   Exercises Shoulder     Shoulder Exercises: Standing   Horizontal ABduction Strengthening;Both;20 reps;Theraband   Theraband Level (Shoulder Horizontal ABduction) Level 1 (Yellow)   External Rotation Strengthening;Both;20 reps;Theraband   Theraband Level (Shoulder External Rotation) Level 1 (Yellow)   External Rotation Limitations B shoulder ER with yellow theraband seperately x20 reps each   Internal Rotation Strengthening;Both;20 reps;Theraband   Theraband Level (Shoulder Internal Rotation) Level 1 (Yellow)   Extension Strengthening;Both;20 reps;Theraband   Theraband Level (Shoulder Extension) Level 1 (Yellow)   Row Strengthening;Both;Theraband;20 reps   Theraband Level (Shoulder Row) Level 1 (Yellow)     Modalities   Modalities Ultrasound     Ultrasound   Ultrasound Location B thoracic  paraspinals   Ultrasound Parameters 1.5 w/cm2, 100%, 1 mhz x10 min   Ultrasound Goals Pain     Manual Therapy   Manual Therapy Myofascial release   Myofascial Release MFR/STW to B thoracic paraspinals to decrease tightness                     PT Long Term Goals - 01/04/16 1133      PT LONG TERM GOAL #1   Title Independent with an HEP.   Time 6   Period Weeks   Status New     PT LONG TERM GOAL #2   Title Increase active cervical rotation to 70 degrees+ so patient can turn head more easily while driving.   Time 6   Period Weeks   Status New     PT LONG TERM GOAL #3   Title Perform ADL's with pain not > 3/10.   Time 6   Period Weeks   Status New                Plan - 02/08/16 0917    Clinical Impression Statement Patient presented in clinic with only minimal L UT tightness and more discomfort along B thoracic musculature. Patient had no adverse reports with therapeutic exercises today and required moderate multimodal cueing for proper exercise technique. Normal [place] response to B thoracic paraspinals upon end of modality. L thoracic paraspinals tightness greater than what was present in R thoracic musculature. Patient encouraged to monitor symptoms until next visit so assessment can be done in regards to HEP and protocol.   Rehab Potential Good   PT Frequency 2x / week   PT Duration 6 weeks   PT Treatment/Interventions ADLs/Self Care Home Management;Cryotherapy;Electrical Stimulation;Ultrasound;Moist Heat;Therapeutic activities;Therapeutic exercise;Patient/family education;Passive range of motion;Manual techniques;Dry needling   PT Next Visit Plan Continue manual therapy and modalities as symptoms dictate per MPT POC.    PT Home Exercise Plan HEP- UT and Levator Scapula stretches, chin tucks, scapular squeeze   Consulted and Agree with Plan of Care Patient      Patient will benefit from skilled therapeutic intervention in order to improve the following deficits and impairments:  Pain, Decreased activity tolerance, Decreased range of motion  Visit Diagnosis: Cervicalgia  Abnormal posture     Problem List Patient Active Problem List   Diagnosis Date Noted  . Chest tightness or pressure 04/03/2014  . Dizziness 04/03/2014  . Headache 04/03/2014  . Chest pain 04/02/2014  . GERD (gastroesophageal reflux disease) 04/02/2014  . Meningitis, unspecified(322.9) 08/11/2012  . 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 HLD (hyperlipidemia) 01/05/2009  . OVERWEIGHT/OBESITY 01/05/2009  . Essential hypertension 01/05/2009  . Palpitations 01/05/2009  . CHEST PAIN-UNSPECIFIED 01/05/2009    [name], PTA 02/08/2016, 9:26  AM  [facility] [address] [place], [place], [address] Phone: [phone]   Fax:  [phone]  Name: [name] [name] MRN: [id number] Date of Birth: 01-Jan-1961

## 2016-02-15 ENCOUNTER — Ambulatory Visit: Payer: BLUE CROSS/BLUE SHIELD | Admitting: Physical Therapy

## 2016-02-15 ENCOUNTER — Encounter: Payer: Self-pay | Admitting: Physical Therapy

## 2016-02-15 ENCOUNTER — Encounter: Payer: Self-pay | Admitting: *Deleted

## 2016-02-15 DIAGNOSIS — R293 Abnormal posture: Secondary | ICD-10-CM | POA: Diagnosis not present

## 2016-02-15 DIAGNOSIS — M542 Cervicalgia: Secondary | ICD-10-CM

## 2016-02-15 NOTE — Therapy (Signed)
[facility] [place], [place], [address] Phone: [phone]   Fax:  [phone]  Physical Therapy Treatment  Patient Details  Name: [name] [name] MRN: [id number] Date of Birth: [birth year] Referring Provider: Ashok Pall MD.  Encounter Date: 02/15/2016      PT End of Session - 02/15/16 0825    Visit Number 7   Number of Visits 12   Date for PT Re-Evaluation 02/15/16   PT Start Time 0825   PT Stop Time 0910   PT Time Calculation (min) 45 min   Activity Tolerance Patient tolerated treatment well   Behavior During Therapy [facility] for tasks assessed/performed      Past Medical History:  Diagnosis Date  . Aneurysm ([place])    brain  . Chest pain    denied on 06/18/2012  . Chronic kidney disease    L - upper pole- defect, doesn't effect     . Family history of anesthesia complication    brother & neice "flat lined" during induction: neice d/t a medication given for a blood clotting problem; brother d/t "miscalculated amount of anesthesia"  . GERD (gastroesophageal reflux disease)    h/o colitis   . H. pylori infection    2012- stomach biopsy  . H/O hiatal hernia   . Headache(784.0)    using tylenol prn  . Hyperlipidemia   . Hypertension   . Hypoglycemia   . Normal cardiac stress test    pt. released fr. Dr. [name] care in 10/2011, all findings w/i normal limits  . Overweight(278.02)    Obesity  . Palpitations   . Sinusitis, acute    seen by PCP- 06/17/2012, will treat /w augmentin & tussin/DM  . Uterine fibroid    pt. states that she has a "closed cervix"     Past Surgical History:  Procedure Laterality Date  . COLONOSCOPY    . CRANIOTOMY Right 06/24/2012   Procedure: CRANIOTOMY INTRACRANIAL ANEURYSM FOR CAROTID;  Surgeon: [name], MD;  Location: [place] [facility];  Service: Neurosurgery;  Laterality: Right;  RIGHT Pterional Craniotomy for aneurysm  . LIPOMA EXCISION     buttock 10/2010    There were no  vitals filed for this visit.      Subjective Assessment - 02/15/16 0824    Subjective Reports that it took her four days to get over exercise from last treatment. Reports that pain feels like a T in her back. Put on pain patch this morning. Reports a sensations like almost going numb or wanting to draw up in both arms.   Patient Stated Goals I want to get out of pain.   Currently in Pain? Yes   Pain Score 7    Pain Location Neck   Pain Orientation Right;Left;Mid   Pain Descriptors / Indicators Other (Comment)  "isolated pain"   Pain Type Chronic pain   Pain Radiating Towards down into B scapular retractors   Pain Onset More than a month ago            [facility] PT Assessment - 02/15/16 0001      Assessment   Medical Diagnosis Cervicalgia.     Precautions   Precautions None     Restrictions   Weight Bearing Restrictions No                     [facility] Adult PT Treatment/Exercise - 02/15/16 0001      Modalities   Modalities Electrical Stimulation;Moist Heat;Ultrasound  Moist Heat Therapy   Number Minutes Moist Heat 15 Minutes   Moist Heat Location Cervical     Electrical Stimulation   Electrical Stimulation Location B UT/ scapular retractors   Electrical Stimulation Action IFC   Electrical Stimulation Parameters 80-150 hz x15 min   Electrical Stimulation Goals Pain;Tone     Ultrasound   Ultrasound Location B UT/ scapular retractors   Ultrasound Parameters 1.5 w/cm2, 100%, 1 mhz x10 min   Ultrasound Goals Pain     Manual Therapy   Manual Therapy Myofascial release   Myofascial Release MFR/STW to B UT/ scapular retractors to decrease tightness                     PT Long Term Goals - 02/15/16 0858      PT LONG TERM GOAL #1   Title Independent with an HEP.   Time 6   Period Weeks   Status On-going     PT LONG TERM GOAL #2   Title Increase active cervical rotation to 70 degrees+ so patient can turn head more easily while driving.    Time 6   Period Weeks   Status On-going     PT LONG TERM GOAL #3   Title Perform ADL's with pain not > 3/10.   Time 6   Period Weeks   Status On-going  Depends according to patient of amount of activity 02/15/2016               Plan - 02/15/16 0915    Clinical Impression Statement Patient presented in clinic with increased cervical/upper back pain today. Increased tightness located in B UT with minimal increased tightness in B scapular retractors. A pea sized area was palpated in approximatiately L mid trap region that patient did not report as sensitive. Sensitiivty was noted with manual therapy to R UT region that patient reported as radiating into R thoracic region. Normal modalities response noted following removal of the modalities.   Rehab Potential Good   PT Frequency 2x / week   PT Duration 6 weeks   PT Treatment/Interventions ADLs/Self Care Home Management;Cryotherapy;Electrical Stimulation;Ultrasound;Moist Heat;Therapeutic activities;Therapeutic exercise;Patient/family education;Passive range of motion;Manual techniques;Dry needling   PT Next Visit Plan Continue manual therapy and modalities as symptoms dictate per MPT POC.    PT Home Exercise Plan HEP- UT and Levator Scapula stretches, chin tucks, scapular squeeze   Consulted and Agree with Plan of Care Patient      Patient will benefit from skilled therapeutic intervention in order to improve the following deficits and impairments:  Pain, Decreased activity tolerance, Decreased range of motion  Visit Diagnosis: Cervicalgia  Abnormal posture     Problem List Patient Active Problem List   Diagnosis Date Noted  . Chest tightness or pressure 04/03/2014  . Dizziness 04/03/2014  . Headache 04/03/2014  . Chest pain 04/02/2014  . GERD (gastroesophageal reflux disease) 04/02/2014  . Meningitis, unspecified(322.9) 08/11/2012  . 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 HLD (hyperlipidemia) 01/05/2009  .  OVERWEIGHT/OBESITY 01/05/2009  . Essential hypertension 01/05/2009  . Palpitations 01/05/2009  . CHEST PAIN-UNSPECIFIED 01/05/2009    [name] [name], PTA 02/15/2016, 9:28 AM  [facility] [place], [place], [address] Phone: [phone]   Fax:  [phone]  Name: [name] [name] MRN: [id number] Date of Birth: [birth year]

## 2016-02-16 ENCOUNTER — Ambulatory Visit (INDEPENDENT_AMBULATORY_CARE_PROVIDER_SITE_OTHER): Payer: BLUE CROSS/BLUE SHIELD | Admitting: Cardiology

## 2016-02-16 ENCOUNTER — Encounter: Payer: Self-pay | Admitting: Cardiology

## 2016-02-16 VITALS — BP 108/74 | HR 59 | Ht 63.0 in | Wt 207.0 lb

## 2016-02-16 DIAGNOSIS — R0789 Other chest pain: Secondary | ICD-10-CM

## 2016-02-16 NOTE — Patient Instructions (Signed)

## 2016-02-16 NOTE — Progress Notes (Signed)
Clinical Summary [name] [name] is a 55 y.o.female seen today for follow up of the following medical problems. This is a focused visit on recent symptoms of chest pain.   1. Chest pain - long history of atypical chest pain  -  she completed a lexiscan that was overall low risk, small apical defect probable artifact. - seen at [facility] 10/17 with chest pain. CT PE negative.   - has been having left sided pain in ribcage. Tender to palpation. Pain in neck radiating into shoulders and in back of head causing headaches.       Past Medical History:  Diagnosis Date  . 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 Chest pain    denied on 06/18/2012  . Chronic kidney disease    L - upper pole- defect, doesn't effect     . Family history of anesthesia complication    brother & neice "flat lined" during induction: neice d/t a medication given for a blood clotting problem; brother d/t "miscalculated amount of anesthesia"  . GERD (gastroesophageal reflux disease)    h/o colitis   . H. pylori infection    2012- stomach biopsy  . H/O hiatal hernia   . Headache(784.0)    using tylenol prn  . Hyperlipidemia   . Hypertension   . Hypoglycemia   . Normal cardiac stress test    pt. released fr. Dr. [name] care in 10/2011, all findings w/i normal limits  . Overweight(278.02)    Obesity  . Palpitations   . Sinusitis, acute    seen by PCP- 06/17/2012, will treat /w augmentin & tussin/DM  . Uterine fibroid    pt. states that she has a "closed cervix"      Allergies  Allergen Reactions  . Zithromax [Azithromycin] Anaphylaxis  . Ace Inhibitors Swelling  . Ciprofloxacin Nausea And Vomiting    Also: kidney failure   . Other Swelling, Other (See Comments) and Cough    Sneezing allergy to cats/grass/dust  . Medrol [Methylprednisolone] Palpitations    Heart racing, increased BP  . Tetracyclines & Related Palpitations    Heart racing, increased bp     Current Outpatient Prescriptions  Medication  Sig Dispense Refill  . acetaminophen (TYLENOL) 650 MG CR tablet Take 1,300 mg by mouth every 8 (eight) hours as needed for pain or fever.     [name] amitriptyline (ELAVIL) 25 MG tablet Take 1 tablet (25 mg total) by mouth at bedtime. (Patient not taking: Reported on 01/25/2016) 7 tablet 0  . amitriptyline (ELAVIL) 50 MG tablet Take 1 tablet (50 mg total) by mouth at bedtime. (Patient not taking: Reported on 01/25/2016) 30 tablet 2  . aspirin 81 MG tablet Take 1 tablet (81 mg total) by mouth daily. 30 tablet 0  . Cholecalciferol (D 1000) 1000 UNITS tablet Take 1,000 Units by mouth every evening.     . diltiazem (CARDIZEM CD) 240 MG 24 hr capsule TAKE 1 CAPSULE DAILY 90 capsule 3  . diltiazem (CARDIZEM LA) 240 MG 24 hr tablet Take 180 mg in the morning and 240 mg in the evening (Patient taking differently: Take 180-240 mg by mouth 2 (two) times daily. Take 180 mg in the morning and 240 mg in the evening) 180 tablet 3  . hydrALAZINE (APRESOLINE) 50 MG tablet Take 50 mg by mouth 2 (two) times daily.    [name] loratadine (CLARITIN) 10 MG tablet Take 10 mg by mouth daily.     [name] lovastatin (MEVACOR) 10  MG tablet Take 10 mg by mouth daily.    . metoprolol (LOPRESSOR) 50 MG tablet Take 50 mg by mouth 2 (two) times daily.    . pantoprazole (PROTONIX) 40 MG tablet Take 40 mg by mouth daily.  1  . PROAIR HFA 108 (90 Base) MCG/ACT inhaler INHALE 2 PUFF EVERY 4 HOURS AS NEEDED  1   No current facility-administered medications for this visit.      Past Surgical History:  Procedure Laterality Date  . COLONOSCOPY    . CRANIOTOMY Right 06/24/2012   Procedure: CRANIOTOMY INTRACRANIAL ANEURYSM FOR CAROTID;  Surgeon: [name], MD;  Location: [place] [facility];  Service: Neurosurgery;  Laterality: Right;  RIGHT Pterional Craniotomy for aneurysm  . LIPOMA EXCISION     buttock 10/2010     Allergies  Allergen Reactions  . Zithromax [Azithromycin] Anaphylaxis  . Ace Inhibitors Swelling  . Ciprofloxacin Nausea And Vomiting     Also: kidney failure   . Other Swelling, Other (See Comments) and Cough    Sneezing allergy to cats/grass/dust  . Medrol [Methylprednisolone] Palpitations    Heart racing, increased BP  . Tetracyclines & Related Palpitations    Heart racing, increased bp      Family History  Problem Relation Age of Onset  . Hypertension Other   . Coronary artery disease Other   . Dementia Mother   . Dementia Maternal Aunt      Social History [name] [name] reports that she quit smoking about 11 years ago. Her smoking use included Cigarettes. She started smoking about 27 years ago. She has a 12.00 pack-year smoking history. She has never used smokeless tobacco. Ms. Degarmo reports that she does not drink alcohol.   Review of Systems CONSTITUTIONAL: No weight loss, fever, chills, weakness or fatigue.  HEENT: Eyes: No visual loss, blurred vision, double vision or yellow sclerae.No hearing loss, sneezing, congestion, runny nose or sore throat.  SKIN: No rash or itching.  CARDIOVASCULAR: per hpi RESPIRATORY: No shortness of breath, cough or sputum.  GASTROINTESTINAL: No anorexia, nausea, vomiting or diarrhea. No abdominal pain or blood.  GENITOURINARY: No burning on urination, no polyuria NEUROLOGICAL: No headache, dizziness, syncope, paralysis, ataxia, numbness or tingling in the extremities. No change in bowel or bladder control.  MUSCULOSKELETAL: No muscle, back pain, joint pain or stiffness.  LYMPHATICS: No enlarged nodes. No history of splenectomy.  PSYCHIATRIC: No history of depression or anxiety.  ENDOCRINOLOGIC: No reports of sweating, cold or heat intolerance. No polyuria or polydipsia.  [name]   Physical Examination Vitals:   02/16/16 1345  BP: 108/74  Pulse: (!) 59   Vitals:   02/16/16 1345  Weight: 207 lb (93.9 kg)  Height: 5\' 3"  (1.6 m)    Gen: resting comfortably, no acute distress HEENT: no scleral icterus, pupils equal round and reactive, no palptable cervical  adenopathy,  CV: RRR, no m/r/g, no jvd Resp: Clear to auscultation bilaterally GI: abdomen is soft, non-tender, non-distended, normal bowel sounds, no hepatosplenomegaly MSK: extremities are warm, no edema.  Skin: warm, no rash Neuro:  no focal deficits Psych: appropriate affect   Diagnostic Studies 05/2009 Nuclear stress No ischemia  03/2014 Echo Study Conclusions  - Left ventricle: The cavity size was normal. Systolic function was normal. Wall motion was normal; there were no regional wall motion abnormalities.  03/2014 Lexiscan MPI IMPRESSION: 1. No reversible ischemia or infarction.  2. Normal left ventricular wall motion.  3. Left ventricular ejection fraction 78%  4. Low-risk stress test  findings*.  03/2014 Event Monitor Symptoms correlate with NSR. One episode of fluttering showed 2 PACs. No significant arrhythmias  07/2015 Lexiscan MPI  There was no ST segment deviation noted during stress.  This is a low risk study.  The left ventricular ejection fraction is mildly decreased (45-54%). LVEF is 49% just slightly below normal, visually appears normal. Consider correlating with echo.  There is a small mild intensity distal anterior and apical defect with mild reversibility. The apex has normal wall motion. Finding is likely secondary to apical thinning, there is also significant radiotracer uptake in the adjacent gut which may create some artifact. Cannot rule would mild apical ischemia. Overall findings are low risk.    Assessment and Plan   1. Chest pain - atypical symptoms recently,, not consistent with cardiac chest pain - previous low risk nuclear stress - continue to monitor at this time. No indication for further cardiac testing at this time.      Arnoldo Lenis, M.D.

## 2016-02-22 ENCOUNTER — Ambulatory Visit: Payer: BLUE CROSS/BLUE SHIELD | Admitting: Physical Therapy

## 2016-02-22 DIAGNOSIS — L03031 Cellulitis of right toe: Secondary | ICD-10-CM | POA: Diagnosis not present

## 2016-02-22 DIAGNOSIS — L600 Ingrowing nail: Secondary | ICD-10-CM | POA: Diagnosis not present

## 2016-02-22 DIAGNOSIS — M79671 Pain in right foot: Secondary | ICD-10-CM | POA: Diagnosis not present

## 2016-02-22 DIAGNOSIS — M79674 Pain in right toe(s): Secondary | ICD-10-CM | POA: Diagnosis not present

## 2016-02-28 ENCOUNTER — Ambulatory Visit: Payer: BLUE CROSS/BLUE SHIELD | Admitting: Neurology

## 2016-02-29 ENCOUNTER — Ambulatory Visit: Payer: BLUE CROSS/BLUE SHIELD | Attending: Neurosurgery | Admitting: Physical Therapy

## 2016-02-29 ENCOUNTER — Encounter: Payer: Self-pay | Admitting: Physical Therapy

## 2016-02-29 DIAGNOSIS — R293 Abnormal posture: Secondary | ICD-10-CM | POA: Diagnosis not present

## 2016-02-29 DIAGNOSIS — M542 Cervicalgia: Secondary | ICD-10-CM | POA: Insufficient documentation

## 2016-02-29 NOTE — Therapy (Signed)
[facility] [place], [place], [address] Phone: [phone]   Fax:  [phone]  Physical Therapy Treatment  Patient Details  Name: [name] [name] MRN: [id number] Date of Birth: December 06, 1960 Referring Provider: Ashok Pall MD.  Encounter Date: 02/29/2016      PT End of Session - 02/29/16 0903    Visit Number 8   Number of Visits 12   Date for PT Re-Evaluation 04/15/16   PT Start Time 0905   PT Stop Time 0951   PT Time Calculation (min) 46 min   Activity Tolerance Patient tolerated treatment well   Behavior During Therapy [facility] for tasks assessed/performed      Past Medical History:  Diagnosis Date  . 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 Chest pain    denied on 06/18/2012  . Chronic kidney disease    L - upper pole- defect, doesn't effect     . Family history of anesthesia complication    brother & neice "flat lined" during induction: neice d/t a medication given for a blood clotting problem; brother d/t "miscalculated amount of anesthesia"  . GERD (gastroesophageal reflux disease)    h/o colitis   . H. pylori infection    2012- stomach biopsy  . H/O hiatal hernia   . Headache(784.0)    using tylenol prn  . Hyperlipidemia   . Hypertension   . Hypoglycemia   . Normal cardiac stress test    pt. released fr. Dr. [name] care in 10/2011, all findings w/i normal limits  . Overweight(278.02)    Obesity  . Palpitations   . Sinusitis, acute    seen by PCP- 06/17/2012, will treat /w augmentin & tussin/DM  . Uterine fibroid    pt. states that she has a "closed cervix"     Past Surgical History:  Procedure Laterality Date  . COLONOSCOPY    . CRANIOTOMY Right 06/24/2012   Procedure: CRANIOTOMY INTRACRANIAL ANEURYSM FOR CAROTID;  Surgeon: [name], MD;  Location: [place] [facility];  Service: Neurosurgery;  Laterality: Right;  RIGHT Pterional Craniotomy for aneurysm  . LIPOMA EXCISION     buttock 10/2010    There were no  vitals filed for this visit.      Subjective Assessment - 02/29/16 0902    Subjective Reports that she has had increased hand pain in the last four days that were like spasms. Reports that she had to have foot surgery due to absess from dropping can on her foot.   Patient Stated Goals I want to get out of pain.   Currently in Pain? Yes   Pain Score 4    Pain Location Shoulder   Pain Orientation Right;Left;Posterior   Pain Descriptors / Indicators Tightness   Pain Type Chronic pain   Pain Onset More than a month ago            [facility] PT Assessment - 02/29/16 0001      Assessment   Medical Diagnosis Cervicalgia.     Precautions   Precautions None     Restrictions   Weight Bearing Restrictions No                     [facility] Adult PT Treatment/Exercise - 02/29/16 0001      Modalities   Modalities Electrical Stimulation;Moist Heat;Ultrasound     Moist Heat Therapy   Number Minutes Moist Heat 15 Minutes   Moist Heat Location Cervical     Electrical Stimulation  Electrical Stimulation Location B UT/ mid Medical illustrator IFC   Electrical Stimulation Parameters 1-10 hz x15 min   Electrical Stimulation Goals Pain;Tone     Ultrasound   Ultrasound Location B UT   Ultrasound Parameters 1.5 w/cm2, 100%, 1 mhz x10 min   Ultrasound Goals Pain     Manual Therapy   Manual Therapy Soft tissue mobilization   Soft tissue mobilization STW to B UT/ mid trap to decrease pain and tightness                     PT Long Term Goals - 02/15/16 0858      PT LONG TERM GOAL #1   Title Independent with an HEP.   Time 6   Period Weeks   Status On-going     PT LONG TERM GOAL #2   Title Increase active cervical rotation to 70 degrees+ so patient can turn head more easily while driving.   Time 6   Period Weeks   Status On-going     PT LONG TERM GOAL #3   Title Perform ADL's with pain not > 3/10.   Time 6   Period Weeks   Status  On-going  Depends according to patient of amount of activity 02/15/2016               Plan - 02/29/16 1020    Clinical Impression Statement Patient presented in clinic with continued tingling and hand sensations especially within the last four days per patient report. Hand sensations in R hand indicated as R thumb and pointer finger region. Minimal abnormal tightness noted in superiomedial most region of B UT upon palpation. Normal modalities response noted following removal of the modalities. Patient also continues to experience sensitivity in L mid thoracic region as well.    Rehab Potential Good   PT Frequency 2x / week   PT Duration 6 weeks   PT Treatment/Interventions ADLs/Self Care Home Management;Cryotherapy;Electrical Stimulation;Ultrasound;Moist Heat;Therapeutic activities;Therapeutic exercise;Patient/family education;Passive range of motion;Manual techniques;Dry needling   PT Next Visit Plan Continue manual therapy and modalities as symptoms dictate per MPT POC.    PT Home Exercise Plan HEP- UT and Levator Scapula stretches, chin tucks, scapular squeeze   Consulted and Agree with Plan of Care Patient      Patient will benefit from skilled therapeutic intervention in order to improve the following deficits and impairments:  Pain, Decreased activity tolerance, Decreased range of motion  Visit Diagnosis: Cervicalgia  Abnormal posture     Problem List Patient Active Problem List   Diagnosis Date Noted  . Chest tightness or pressure 04/03/2014  . Dizziness 04/03/2014  . Headache 04/03/2014  . Chest pain 04/02/2014  . GERD (gastroesophageal reflux disease) 04/02/2014  . Meningitis, unspecified(322.9) 08/11/2012  . 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 HLD (hyperlipidemia) 01/05/2009  . OVERWEIGHT/OBESITY 01/05/2009  . Essential hypertension 01/05/2009  . Palpitations 01/05/2009  . CHEST PAIN-UNSPECIFIED 01/05/2009    [name], PTA 02/29/2016,  10:22 AM  [facility] [address] [place], [place], [address] Phone: [phone]   Fax:  [phone]  Name: Maybell Lafrenier MRN: [id number] Date of Birth: December 10, 1960

## 2016-03-04 DIAGNOSIS — Z0289 Encounter for other administrative examinations: Secondary | ICD-10-CM

## 2016-03-05 ENCOUNTER — Encounter: Payer: Self-pay | Admitting: Neurology

## 2016-03-06 DIAGNOSIS — M79671 Pain in right foot: Secondary | ICD-10-CM | POA: Diagnosis not present

## 2016-03-06 DIAGNOSIS — L600 Ingrowing nail: Secondary | ICD-10-CM | POA: Diagnosis not present

## 2016-03-06 DIAGNOSIS — M79674 Pain in right toe(s): Secondary | ICD-10-CM | POA: Diagnosis not present

## 2016-03-06 DIAGNOSIS — B351 Tinea unguium: Secondary | ICD-10-CM | POA: Diagnosis not present

## 2016-03-07 ENCOUNTER — Ambulatory Visit: Payer: BLUE CROSS/BLUE SHIELD | Admitting: Physical Therapy

## 2016-03-07 ENCOUNTER — Encounter: Payer: Self-pay | Admitting: Physical Therapy

## 2016-03-07 DIAGNOSIS — R293 Abnormal posture: Secondary | ICD-10-CM

## 2016-03-07 DIAGNOSIS — M542 Cervicalgia: Secondary | ICD-10-CM

## 2016-03-07 NOTE — Therapy (Signed)
[facility] [place], [place], [address] Phone: [phone]   Fax:  [phone]  Physical Therapy Treatment  Patient Details  Name: [name] [name] MRN: [id number] Date of Birth: 1960/06/24 Referring Provider: Ashok Pall MD.  Encounter Date: 03/07/2016      PT End of Session - 03/07/16 1526    Visit Number 9   Number of Visits 12   Date for PT Re-Evaluation 04/15/16   PT Start Time 1524   PT Stop Time 1618   PT Time Calculation (min) 54 min   Activity Tolerance Patient tolerated treatment well   Behavior During Therapy [facility] for tasks assessed/performed      Past Medical History:  Diagnosis Date  . 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 Chest pain    denied on 06/18/2012  . Chronic kidney disease    L - upper pole- defect, doesn't effect     . Family history of anesthesia complication    brother & neice "flat lined" during induction: neice d/t a medication given for a blood clotting problem; brother d/t "miscalculated amount of anesthesia"  . GERD (gastroesophageal reflux disease)    h/o colitis   . H. pylori infection    2012- stomach biopsy  . H/O hiatal hernia   . Headache(784.0)    using tylenol prn  . Hyperlipidemia   . Hypertension   . Hypoglycemia   . Normal cardiac stress test    pt. released fr. Dr. [name] care in 10/2011, all findings w/i normal limits  . Overweight(278.02)    Obesity  . Palpitations   . Sinusitis, acute    seen by PCP- 06/17/2012, will treat /w augmentin & tussin/DM  . Uterine fibroid    pt. states that she has a "closed cervix"     Past Surgical History:  Procedure Laterality Date  . COLONOSCOPY    . CRANIOTOMY Right 06/24/2012   Procedure: CRANIOTOMY INTRACRANIAL ANEURYSM FOR CAROTID;  Surgeon: [name], MD;  Location: [place] [facility];  Service: Neurosurgery;  Laterality: Right;  RIGHT Pterional Craniotomy for aneurysm  . LIPOMA EXCISION     buttock 10/2010    There were no  vitals filed for this visit.      Subjective Assessment - 03/07/16 1525    Subjective Reports more L rib pain that comes around to below sternum. Reports that it is a good day for her neck but still had hand numbness this morning.   Patient Stated Goals I want to get out of pain.   Currently in Pain? Yes   Pain Score 5    Pain Location Back   Pain Orientation Left;Mid   Pain Descriptors / Indicators Discomfort   Pain Type Chronic pain   Pain Onset More than a month ago            [facility] PT Assessment - 03/07/16 0001      Assessment   Medical Diagnosis Cervicalgia.     Precautions   Precautions None     Restrictions   Weight Bearing Restrictions No                     [facility] Adult PT Treatment/Exercise - 03/07/16 0001      Modalities   Modalities Electrical Stimulation;Moist Heat;Ultrasound     Moist Heat Therapy   Number Minutes Moist Heat 15 Minutes   Moist Heat Location Other (comment)  Thoracic     Acupuncturist  Stimulation Location L mid to lower thoracic paraspinals   Electrical Stimulation Action Pre-Mod   Electrical Stimulation Parameters 80-150 hz x15 min   Electrical Stimulation Goals Pain;Tone     Ultrasound   Ultrasound Location L mid to lower thoracic paraspinals   Ultrasound Parameters 1.5 w/cm2, 100%, 1 mhz x10 min   Ultrasound Goals Pain     Manual Therapy   Manual Therapy Soft tissue mobilization   Soft tissue mobilization STW to L mid to lower thoracic paraspinals to decrease pain and tightness                     PT Long Term Goals - 02/15/16 0858      PT LONG TERM GOAL #1   Title Independent with an HEP.   Time 6   Period Weeks   Status On-going     PT LONG TERM GOAL #2   Title Increase active cervical rotation to 70 degrees+ so patient can turn head more easily while driving.   Time 6   Period Weeks   Status On-going     PT LONG TERM GOAL #3   Title Perform ADL's with pain not >  3/10.   Time 6   Period Weeks   Status On-going  Depends according to patient of amount of activity 02/15/2016               Plan - 03/07/16 1558    Clinical Impression Statement Patient continues to arrive to treatment with reports of hand numbness but presented today with more thoracic pain than cervical pain. Patient indicated that pain was beginning in sternum and wrapping around to the back on the L side. Patient also indicated that at times the area right over sternum will become inflammed and sore as it was today. Patient indicated soreness in region of thoracic paraspinals that is directly posterior to sternum. Tightness was indicated in that musculature of the mid to lower thoracic spine. Normal modalities response noted following removal of the modalities. Patient experienced decreased mid back tightness as well as decreased tightness in sternum although sternum still sore.   Rehab Potential Good   PT Frequency 2x / week   PT Duration 6 weeks   PT Treatment/Interventions ADLs/Self Care Home Management;Cryotherapy;Electrical Stimulation;Ultrasound;Moist Heat;Therapeutic activities;Therapeutic exercise;Patient/family education;Passive range of motion;Manual techniques;Dry needling   PT Next Visit Plan Continue with PROM/AAROM exercises with modalities PRN per MPT POC.   PT Home Exercise Plan HEP- UT and Levator Scapula stretches, chin tucks, scapular squeeze   Consulted and Agree with Plan of Care Patient      Patient will benefit from skilled therapeutic intervention in order to improve the following deficits and impairments:  Pain, Decreased activity tolerance, Decreased range of motion  Visit Diagnosis: Cervicalgia  Abnormal posture     Problem List Patient Active Problem List   Diagnosis Date Noted  . Chest tightness or pressure 04/03/2014  . Dizziness 04/03/2014  . Headache 04/03/2014  . Chest pain 04/02/2014  . GERD (gastroesophageal reflux disease)  04/02/2014  . Meningitis, unspecified(322.9) 08/11/2012  . 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 HLD (hyperlipidemia) 01/05/2009  . OVERWEIGHT/OBESITY 01/05/2009  . Essential hypertension 01/05/2009  . Palpitations 01/05/2009  . CHEST PAIN-UNSPECIFIED 01/05/2009    [name], PTA 03/07/2016, 4:37 PM  [facility] [address] [place], [place], [address] Phone: [phone]   Fax:  [phone]  Name: [name] [name] MRN: [id number] Date of Birth: [birth year]

## 2016-03-12 ENCOUNTER — Ambulatory Visit (INDEPENDENT_AMBULATORY_CARE_PROVIDER_SITE_OTHER): Payer: BLUE CROSS/BLUE SHIELD | Admitting: Family Medicine

## 2016-03-12 ENCOUNTER — Encounter: Payer: Self-pay | Admitting: Family Medicine

## 2016-03-12 VITALS — BP 103/66 | HR 63 | Temp 98.9°F | Ht 62.0 in | Wt 205.0 lb

## 2016-03-12 DIAGNOSIS — R1010 Upper abdominal pain, unspecified: Secondary | ICD-10-CM

## 2016-03-12 LAB — URINALYSIS, COMPLETE
Bilirubin, UA: NEGATIVE
Glucose, UA: NEGATIVE
Ketones, UA: NEGATIVE
Nitrite, UA: NEGATIVE
PH UA: 5 (ref 5.0–7.5)
RBC, UA: NEGATIVE
Specific Gravity, UA: 1.02 (ref 1.005–1.030)
Urobilinogen, Ur: 0.2 mg/dL (ref 0.2–1.0)

## 2016-03-12 LAB — MICROSCOPIC EXAMINATION: Epithelial Cells (non renal): >10 /hpf — AB (ref 0–10)

## 2016-03-12 NOTE — Progress Notes (Signed)
   Subjective:    Patient ID: [name] [name], female    DOB: [birth year], 55 y.o.   MRN: [id number]  HPI patient complains of pain in her epigastric area that radiates down across her right side and hurts in her right kidney area.  She's had symptoms like this before because she's had gallbladder studies as well as endoscopies trying to diagnose the problem. She wonders about the possibility of kidney infection. She has been treated to previous times for H pylori.    Patient Active Problem List   Diagnosis Date Noted  . Chest tightness or pressure 04/03/2014  . Dizziness 04/03/2014  . Headache 04/03/2014  . Chest pain 04/02/2014  . GERD (gastroesophageal reflux disease) 04/02/2014  . Meningitis, unspecified(322.9) 08/11/2012  . 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 HLD (hyperlipidemia) 01/05/2009  . OVERWEIGHT/OBESITY 01/05/2009  . Essential hypertension 01/05/2009  . Palpitations 01/05/2009  . CHEST PAIN-UNSPECIFIED 01/05/2009   Outpatient Encounter Prescriptions as of 03/12/2016  Medication Sig  . acetaminophen (TYLENOL) 650 MG CR tablet Take 1,300 mg by mouth every 8 (eight) hours as needed for pain or fever.   [name] aspirin 81 MG tablet Take 1 tablet (81 mg total) by mouth daily.  . Cholecalciferol (D 1000) 1000 UNITS tablet Take 1,000 Units by mouth every evening.   . diltiazem (CARDIZEM LA) 240 MG 24 hr tablet Take 180 mg in the morning and 240 mg in the evening (Patient taking differently: Take 180-240 mg by mouth 2 (two) times daily. Take 180 mg in the morning and 240 mg in the evening)  . hydrALAZINE (APRESOLINE) 50 MG tablet Take 50 mg by mouth daily.   [name] loratadine (CLARITIN) 10 MG tablet Take 10 mg by mouth daily.   . metoprolol (LOPRESSOR) 50 MG tablet Take 50 mg by mouth 2 (two) times daily.  . pantoprazole (PROTONIX) 40 MG tablet Take 40 mg by mouth daily.  [name] PROAIR HFA 108 (90 Base) MCG/ACT inhaler INHALE 2 PUFF EVERY 4 HOURS AS NEEDED   No  facility-administered encounter medications on file as of 03/12/2016.       Review of Systems  Constitutional: Negative.   Respiratory: Negative.   Cardiovascular: Negative.   Gastrointestinal: Positive for abdominal pain.  Psychiatric/Behavioral: Negative.        Objective:   Physical Exam  Constitutional: She is oriented to person, place, and time. She appears well-developed and well-nourished.  Cardiovascular: Normal rate.   Pulmonary/Chest: Effort normal and breath sounds normal.  Abdominal: Soft. There is tenderness (epigastric area tenderness).  Neurological: She is alert and oriented to person, place, and time.  Psychiatric: She has a normal mood and affect. Her behavior is normal.   BP 103/66   Pulse 63   Temp 98.9 F (37.2 C) (Oral)   Ht 5\' 2"  (1.575 m)   Wt 205 lb (93 kg)   LMP 08/25/2012   BMI 37.49 kg/m         Assessment & Plan:  1. Pain of upper abdomen Urinalysis curiously enough has 6-10 white cells and bacteria and will be cultured. Since she is not having lower irritative symptoms such as dysuria frequency will hold antibiotic. If culture is negative consider repeating gallbladder ultrasound and/or referral to GI  Wardell Honour MD - Urinalysis, Complete

## 2016-03-14 ENCOUNTER — Ambulatory Visit: Payer: BLUE CROSS/BLUE SHIELD | Admitting: Physical Therapy

## 2016-03-14 ENCOUNTER — Encounter: Payer: Self-pay | Admitting: Physical Therapy

## 2016-03-14 DIAGNOSIS — M542 Cervicalgia: Secondary | ICD-10-CM | POA: Diagnosis not present

## 2016-03-14 DIAGNOSIS — R293 Abnormal posture: Secondary | ICD-10-CM

## 2016-03-14 NOTE — Therapy (Signed)
[facility] [place], [place], [address] Phone: [phone]   Fax:  [phone]  Physical Therapy Treatment  Patient Details  Name: [name] [name] MRN: [id number] Date of Birth: 1960-05-20 Referring Provider: Ashok Pall MD.  Encounter Date: 03/14/2016      PT End of Session - 03/14/16 0958    Visit Number 10   Number of Visits 12   Date for PT Re-Evaluation 04/15/16   PT Start Time 0901   PT Stop Time 0957   PT Time Calculation (min) 56 min   Activity Tolerance Patient tolerated treatment well   Behavior During Therapy [facility] for tasks assessed/performed      Past Medical History:  Diagnosis Date  . Aneurysm ([place])    brain  . Chest pain    denied on 06/18/2012  . Chronic kidney disease    L - upper pole- defect, doesn't effect     . Family history of anesthesia complication    brother & neice "flat lined" during induction: neice d/t a medication given for a blood clotting problem; brother d/t "miscalculated amount of anesthesia"  . GERD (gastroesophageal reflux disease)    h/o colitis   . H. pylori infection    2012- stomach biopsy  . H/O hiatal hernia   . Headache(784.0)    using tylenol prn  . Hyperlipidemia   . Hypertension   . Hypoglycemia   . Normal cardiac stress test    pt. released fr. Dr. [name] care in 10/2011, all findings w/i normal limits  . Overweight(278.02)    Obesity  . Palpitations   . Sinusitis, acute    seen by PCP- 06/17/2012, will treat /w augmentin & tussin/DM  . Uterine fibroid    pt. states that she has a "closed cervix"     Past Surgical History:  Procedure Laterality Date  . COLONOSCOPY    . CRANIOTOMY Right 06/24/2012   Procedure: CRANIOTOMY INTRACRANIAL ANEURYSM FOR CAROTID;  Surgeon: [name], MD;  Location: [facility];  Service: Neurosurgery;  Laterality: Right;  RIGHT Pterional Craniotomy for aneurysm  . LIPOMA EXCISION     buttock 10/2010    There were no  vitals filed for this visit.      Subjective Assessment - 03/14/16 0924    Subjective Patient reports relief after last treatment yet ongoing pain reported   Patient Stated Goals I want to get out of pain.   Currently in Pain? Yes   Pain Score 5    Pain Location Generalized   Pain Orientation Left;Right;Mid   Pain Descriptors / Indicators Discomfort   Pain Onset More than a month ago   Pain Frequency Constant   Aggravating Factors  any prolong position   Pain Relieving Factors heat            [facility] PT Assessment - 03/14/16 0001      ROM / Strength   AROM / PROM / Strength AROM     AROM   AROM Assessment Site Cervical   Cervical - Right Rotation 60   Cervical - Left Rotation 60                     [facility] Adult PT Treatment/Exercise - 03/14/16 0001      Moist Heat Therapy   Number Minutes Moist Heat 15 Minutes   Moist Heat Location --  thoracis     Electrical Stimulation   Electrical Stimulation Location bil mid to lower thoracic  paraspinals   Electrical Stimulation Action premod   Electrical Stimulation Parameters 80-150hz  x44min   Electrical Stimulation Goals Pain;Tone     Ultrasound   Ultrasound Location bil mid to low thoracic   Ultrasound Parameters 1.5w/cm2/50%/19mhz x99min   Ultrasound Goals Pain     Manual Therapy   Manual Therapy Soft tissue mobilization   Soft tissue mobilization STW to bil UT/scap and  mid to lower thoracic paraspinals to decrease pain and tightness                     PT Long Term Goals - 03/14/16 0959      PT LONG TERM GOAL #1   Title Independent with an HEP.   Time 6   Period Weeks   Status On-going     PT LONG TERM GOAL #2   Title Increase active cervical rotation to 70 degrees+ so patient can turn head more easily while driving.   Period Weeks   Status On-going  bil AROM 60 degrees 03/14/16     PT LONG TERM GOAL #3   Title Perform ADL's with pain not > 3/10.   Time 6   Status On-going                Plan - 03/14/16 1000    Clinical Impression Statement Patient progressing overall with good response to treatments. Patient has reported less pain in c-spine. Patient has ongoing pain in thoracic area esp in left side. Patient has improved bil cervicl rotation to 60 degrees. Patient current goals ongoing due to pain and ROM deficts.   Rehab Potential Good   PT Frequency 2x / week   PT Duration 6 weeks   PT Treatment/Interventions ADLs/Self Care Home Management;Cryotherapy;Electrical Stimulation;Ultrasound;Moist Heat;Therapeutic activities;Therapeutic exercise;Patient/family education;Passive range of motion;Manual techniques;Dry needling   PT Next Visit Plan Continue with POC per MD (sent follow up to MPT to send MD note)   Consulted and Agree with Plan of Care Patient      Patient will benefit from skilled therapeutic intervention in order to improve the following deficits and impairments:  Pain, Decreased activity tolerance, Decreased range of motion  Visit Diagnosis: Cervicalgia  Abnormal posture     Problem List Patient Active Problem List   Diagnosis Date Noted  . Chest tightness or pressure 04/03/2014  . Dizziness 04/03/2014  . Headache 04/03/2014  . Chest pain 04/02/2014  . GERD (gastroesophageal reflux disease) 04/02/2014  . Meningitis, unspecified(322.9) 08/11/2012  . 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 HLD (hyperlipidemia) 01/05/2009  . OVERWEIGHT/OBESITY 01/05/2009  . Essential hypertension 01/05/2009  . Palpitations 01/05/2009  . CHEST PAIN-UNSPECIFIED 01/05/2009    [place], [place],  03/14/2016, 4:02 PM [place]i Applegate MPT [facility] [address] [place], [place], [address] Phone: [phone]   Fax:  [phone]  Name: [name] [name] MRN: [id number] Date of Birth: 1960-06-05

## 2016-03-15 ENCOUNTER — Telehealth: Payer: Self-pay | Admitting: Family Medicine

## 2016-03-18 ENCOUNTER — Other Ambulatory Visit: Payer: Self-pay | Admitting: *Deleted

## 2016-03-18 ENCOUNTER — Other Ambulatory Visit: Payer: Self-pay | Admitting: Family Medicine

## 2016-03-18 DIAGNOSIS — R1013 Epigastric pain: Principal | ICD-10-CM

## 2016-03-18 DIAGNOSIS — M545 Low back pain, unspecified: Secondary | ICD-10-CM

## 2016-03-18 DIAGNOSIS — G8929 Other chronic pain: Secondary | ICD-10-CM

## 2016-03-18 NOTE — Telephone Encounter (Signed)
Left message, GI referral done.

## 2016-03-18 NOTE — Telephone Encounter (Signed)
Please review urine results and advise.

## 2016-03-18 NOTE — Telephone Encounter (Signed)
There is a slight infection. Is she having symptoms? We really need to get another urine for a culture and sensitivity so we can treat with the right antibiotic if she is having symptoms.

## 2016-03-18 NOTE — Telephone Encounter (Signed)
Please refer back to GI for further workup because of the epigastric pain. This doctor can order CT or ultrasound as needed.

## 2016-03-18 NOTE — Telephone Encounter (Signed)
Patient will come tomorrow to leave another urine for a culture to be done.  She is having some back pain but no burning or issue with voiding otherwise.  Patient still experiencing epigastric pain and wonders if another CT could be ordered?   Dr. Ammie Ferrier notes indicate another [place] could be ordered for checking the gallbladder or a referral to GI.  Please advise.

## 2016-03-19 ENCOUNTER — Other Ambulatory Visit: Payer: BLUE CROSS/BLUE SHIELD

## 2016-03-19 ENCOUNTER — Encounter (INDEPENDENT_AMBULATORY_CARE_PROVIDER_SITE_OTHER): Payer: Self-pay | Admitting: *Deleted

## 2016-03-19 DIAGNOSIS — M545 Low back pain, unspecified: Secondary | ICD-10-CM

## 2016-03-20 LAB — URINE CULTURE

## 2016-03-25 ENCOUNTER — Telehealth: Payer: Self-pay | Admitting: Family Medicine

## 2016-03-25 ENCOUNTER — Ambulatory Visit (INDEPENDENT_AMBULATORY_CARE_PROVIDER_SITE_OTHER): Payer: BLUE CROSS/BLUE SHIELD | Admitting: Neurology

## 2016-03-25 ENCOUNTER — Encounter: Payer: Self-pay | Admitting: Neurology

## 2016-03-25 VITALS — BP 115/75 | HR 54 | Wt 205.8 lb

## 2016-03-25 DIAGNOSIS — G44209 Tension-type headache, unspecified, not intractable: Secondary | ICD-10-CM

## 2016-03-25 DIAGNOSIS — I10 Essential (primary) hypertension: Secondary | ICD-10-CM | POA: Diagnosis not present

## 2016-03-25 DIAGNOSIS — M5412 Radiculopathy, cervical region: Secondary | ICD-10-CM | POA: Diagnosis not present

## 2016-03-25 DIAGNOSIS — I671 Cerebral aneurysm, nonruptured: Secondary | ICD-10-CM | POA: Diagnosis not present

## 2016-03-25 MED ORDER — DULOXETINE HCL 30 MG PO CPEP: 30.0000 mg | ORAL_CAPSULE | Freq: Every day | ORAL | 0 refills | Status: DC

## 2016-03-25 MED ORDER — DULOXETINE HCL 60 MG PO CPEP: 60.0000 mg | ORAL_CAPSULE | Freq: Every day | ORAL | 2 refills | Status: DC

## 2016-03-25 NOTE — Progress Notes (Signed)
NEUROLOGY CLINIC FOLLOW UP NOTE  NAME: [name] [name] DOB: 1960/06/14  HPI: [name] [name] is a 55 y.o. female with PMH of HTN, HLD, right ICA terminus aneurysm s/p clipping in 2013 and headache who presents as a new patient for Headache.   Patient was following with Dr.penumalli in 2013 for headache. At that time, she complained of intermittent headache, right or left-sided, associated with neck pain, left shoulder pain, nausea, blurred vision, and dizziness. Headaches were proceeded by feeding of sore spots over the scalp. She took Tylenol for headache. However she had MRI and MRA at that time showed 45 mm terminal right ICA aneurysm. MRI C-spine showed minimal degenerative changes without significant spinal stenosis. She underwent aneurysm clipping by Dr. Cyndy Freeze and after that her headache was gone for several years.  However, since 6 months ago, the headache gradually coming back, the headache was very similar to previous headache, starting with lower neck pain, bilateral shoulder pain, up to the back of her head, either left or right-sided with neck tightness. Headache more pressure, dull feeling, sometimes lasting 2-3 days. Tylenol works if takes early. For the last 2 weeks, headache seems getting worse. At that same time he had right hand cramping after overexerting the right hand in a party, as well as sometimes left arm tingling. Went to see PCP, had a stress test which was negative and also had MRI/MRA showed previous aneurysm clipping without change, however, also showed symmetric mild signal along bilateral corticospinal tracts, progressed from 2015. It reported this finding is nonspecific however may be seen associated with ALS. Due to this abnormal findings, patient was referred here for further evaluation.  She has history of hypertension, on Cardizem, hydralazine, and metoprolol. Recently her blood pressure was on the lower side and her PCP is trying to taper off hydralazine.  Today blood pressure 99/60. Questionable history of fibromyalgia. He is also on aspirin daily. Denies any alcohol smoking or using illicit drugs.  Interval history: During the interval time, she has had some improvement with PT/OT. However, she still has intermittent mild HA at back of head and neck pain and tight. She sometimes has tenderness along the occipital nerve distribution. She did not start amitriptyline as she had confusion with dosing. She uses lidocaine cream PRN. She admitted that in the morning seems less symptoms and as the day goes by, she may feel more symptoms. She told me that she also has fibromyalgia. BP today 115/75.   Past Medical History:  Diagnosis Date  . Aneurysm ([place])    brain  . Chest pain    denied on 06/18/2012  . Chronic kidney disease    L - upper pole- defect, doesn't effect     . Family history of anesthesia complication    brother & neice "flat lined" during induction: neice d/t a medication given for a blood clotting problem; brother d/t "miscalculated amount of anesthesia"  . GERD (gastroesophageal reflux disease)    h/o colitis   . H. pylori infection    2012- stomach biopsy  . H/O hiatal hernia   . Headache(784.0)    using tylenol prn  . Hyperlipidemia   . Hypertension   . Hypoglycemia   . Normal cardiac stress test    pt. released fr. Dr. [name] care in 10/2011, all findings w/i normal limits  . Overweight(278.02)    Obesity  . Palpitations   . Sinusitis, acute    seen by PCP- 06/17/2012, will treat /w augmentin & tussin/DM  .  Uterine fibroid    pt. states that she has a "closed cervix"    Past Surgical History:  Procedure Laterality Date  . COLONOSCOPY    . CRANIOTOMY Right 06/24/2012   Procedure: CRANIOTOMY INTRACRANIAL ANEURYSM FOR CAROTID;  Surgeon: [name], MD;  Location: [facility];  Service: Neurosurgery;  Laterality: Right;  RIGHT Pterional Craniotomy for aneurysm  . LIPOMA EXCISION     buttock 10/2010   Family History   Problem Relation Age of Onset  . Hypertension Other   . Coronary artery disease Other   . Dementia Mother   . Dementia Maternal Aunt    Current Outpatient Prescriptions  Medication Sig Dispense Refill  . acetaminophen (TYLENOL) 650 MG CR tablet Take 1,300 mg by mouth every 8 (eight) hours as needed for pain or fever.     [name] aspirin 81 MG tablet Take 1 tablet (81 mg total) by mouth daily. 30 tablet 0  . Cholecalciferol (D 1000) 1000 UNITS tablet Take 1,000 Units by mouth every evening.     . diltiazem (CARDIZEM LA) 240 MG 24 hr tablet Take 180 mg in the morning and 240 mg in the evening (Patient taking differently: Take 180-240 mg by mouth 2 (two) times daily. Take 180 mg in the morning and 240 mg in the evening) 180 tablet 3  . hydrALAZINE (APRESOLINE) 50 MG tablet Take 50 mg by mouth daily.     [name] loratadine (CLARITIN) 10 MG tablet Take 10 mg by mouth daily.     . metoprolol (LOPRESSOR) 50 MG tablet Take 50 mg by mouth 2 (two) times daily.    . pantoprazole (PROTONIX) 40 MG tablet Take 40 mg by mouth daily.  1  . PROAIR HFA 108 (90 Base) MCG/ACT inhaler INHALE 2 PUFF EVERY 4 HOURS AS NEEDED  1  . DULoxetine (CYMBALTA) 30 MG capsule Take 1 capsule (30 mg total) by mouth daily. 7 capsule 0  . [START ON 04/01/2016] DULoxetine (CYMBALTA) 60 MG capsule Take 1 capsule (60 mg total) by mouth daily. 30 capsule 2   No current facility-administered medications for this visit.    Allergies  Allergen Reactions  . Zithromax [Azithromycin] Anaphylaxis  . Ace Inhibitors Swelling  . Ciprofloxacin Nausea And Vomiting    Also: kidney failure   . Other Swelling, Other (See Comments) and Cough    Sneezing allergy to cats/grass/dust  . Medrol [Methylprednisolone] Palpitations    Heart racing, increased BP  . Tetracyclines & Related Palpitations    Heart racing, increased bp   Social History   Social History  . Marital status: Single    Spouse name: N/A  . Number of children: N/A  . Years of  education: N/A   Occupational History  . Full time [employer] History Main Topics  . Smoking status: Former Smoker    Packs/day: 0.80    Years: 15.00    Types: Cigarettes    Start date: 02/13/1989    Quit date: 04/28/2004  . Smokeless tobacco: Never Used  . Alcohol use No  . Drug use: No  . Sexual activity: No   Other Topics Concern  . Not on file   Social History Narrative   Single    Review of Systems Full 14 system review of systems performed and notable only for those listed, all others are neg:  Constitutional:   Cardiovascular:  Ear/Nose/Throat:  Ringing in ears  Skin:  Eyes:   Respiratory:  SOB, cough, snoring  Gastroitestinal:   Genitourinary:  Hematology/Lymphatic:   Endocrine:  Musculoskeletal:   Allergy/Immunology:   Neurological:  Headache, numbness, dizziness  Psychiatric:  Sleep: Snoring   Physical Exam  Vitals:   03/25/16 0819  BP: 115/75  Pulse: (!) 54    General - Well nourished, well developed, in no apparent distress.  Ophthalmologic - Sharp disc margins OU.  Cardiovascular - Regular rate and rhythm with no murmur. Carotid pulses were 2+ without bruits.   Neck - supple, no nuchal rigidity. Mild bilateral occipital nerve tenderness on palpation, R>L.   Mental Status -  Level of arousal and orientation to time, place, and person were intact. Language including expression, naming, repetition, comprehension, reading, and writing was assessed and found intact. Attention span and concentration were normal. Recent and remote memory were intact. Fund of Knowledge was assessed and was intact.  Cranial Nerves II - XII - II - Visual field intact OU. III, IV, VI - Extraocular movements intact. V - Facial sensation intact bilaterally. VII - Facial movement intact bilaterally. VIII - Hearing & vestibular intact bilaterally. X - Palate elevates symmetrically. XI - Chin turning & shoulder shrug intact bilaterally. XII -  Tongue protrusion intact.  Motor Strength - The patient's strength was normal in all extremities and pronator drift was absent.  Bulk was normal and fasciculations were absent.   Motor Tone - Muscle tone was assessed at the neck and appendages and was normal.  Reflexes - The patient's reflexes were normal in all extremities and she had no pathological reflexes.  Sensory - Light touch, temperature/pinprick, vibration and proprioception, and Romberg testing were assessed and were normal.    Coordination - The patient had normal movements in the hands and feet with no ataxia or dysmetria.  Tremor was absent.  Gait and Station - The patient's transfers, posture, gait, station, and turns were observed as normal.   Imaging  I have personally reviewed the radiological images below and agree with the radiology interpretations.  MRI with and without contrast 12/14/2015   no acute intracranial process. Symmetric mild abnormal signal along bilateral cortical spinal tracts, progress from 2015. Nonspecific, though associate with ALS. S/p right carotid terminus aneurysm clipping.  CT head 12/14/2015 Previous aneurysm clipping placement the right MCA region. No intracranial mass, hemorrhage, or extra axial fluid collection. The gray-white compartments appears normal. No acute infarct evident. There is a mild paranasal sinus disease.  CT cervical spine 12/14/2015 No fracture or spondylolisthesis  MRA 01/09/2012 There is a 45 mm terminal right ICA aneurysm. No signal change from MRA head on 06/06/2011.  MRI cervical spine 02/06/2012 Minimal degenerative changes without significant spinal stenosis, cord compression, foraminal narrowing or nerve root compression. The right carotid terminal aneurysm is partially visualized on the present exam. There is not adequately accessed by the present technique. No obvious hemorrhage is noted.  Lab Review None    Assessment and Plan:   In summary, [name]  [name] is a 55 y.o. female with PMH of HTN, HLD, right ICA terminus aneurysm s/p clipping in 2013 and headache who presents as a new patient for Headache. She had a similar headache in 2013, found to have right terminal ICA aneurysm s/p clipping. Headache was gone for several years but recently come back. MRI no acute process except stable right terminal ICA clipping. However found to have symmetric mild signal along bilateral corticospinal tracts, reportedly may associated with ALS. However, on my review of images, the signals are the same as those in  her MRI in 2015, no progression. With clinical correlations, this is not ALS but rather normal signal. I have reassured patient.  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 PT/OT helped some but still not resolved. She did not take amitriptyline. She said she also has fibromyalgia, we will try cymbalta first.   Plan: - keep pillow down to shoulder instead of at skull base to ease off symptoms.  - finish off PT/OT for cervicogenic pain  - will try cymbalta daily for cervical neuralgia and fibromyalgia. Take 30mg  daily for 7 days and then 60mg  daily after. - continue lidocaine cream for pain relieve - relaxation and cope with stress can help the neuralgia - can also take tylenol as needed for tension HA and neck pain - will consider nerve block if needed in the future.  - check BP at home and record - follow up with PCP  - follow up in 3 months.   I spent more than 25 minutes of face to face time with the patient. Greater than 50% of time was spent in counseling and coordination of care. We discussed tension HA and cervical neuralgia, treatment options.   No orders of the defined types were placed in this encounter.   Meds ordered this encounter  Medications  . DULoxetine (CYMBALTA) 30 MG capsule    Sig: Take 1 capsule (30 mg total) by  mouth daily.    Dispense:  7 capsule    Refill:  0  . DULoxetine (CYMBALTA) 60 MG capsule    Sig: Take 1 capsule (60 mg total) by mouth daily.    Dispense:  30 capsule    Refill:  2    Patient Instructions  - keep pillow down to shoulder instead of at skull base to ease off symptoms.  - finish off PT/OT for cervicogenic pain  - will try cymbalta daily for cervical neuralgia and fibromyalgia. Take 30mg  daily for 7 days and then 60mg  daily after. - continue lidocaine cream for pain relieve - relaxation and cope with stress can help the neuralgia - can also take tylenol as needed for tension HA and neck pain - will consider nerve block if needed in the future.  - check BP at home and record - follow up with PCP  - follow up in 3 months.    Rosalin Hawking, MD PhD [facility] [address], [place], [place] [address] [phone]

## 2016-03-25 NOTE — Telephone Encounter (Signed)
Urine culture didn't grow anything this time

## 2016-03-25 NOTE — Patient Instructions (Addendum)
-   keep pillow down to shoulder instead of at skull base to ease off symptoms.  - finish off PT/OT for cervicogenic pain  - will try cymbalta daily for cervical neuralgia and fibromyalgia. Take 30mg  daily for 7 days and then 60mg  daily after. - continue lidocaine cream for pain relieve - relaxation and cope with stress can help the neuralgia - can also take tylenol as needed for tension HA and neck pain - will consider nerve block if needed in the future.  - check BP at home and record - follow up with PCP  - follow up in 3 months.

## 2016-03-25 NOTE — Telephone Encounter (Signed)
Patient aware of results.

## 2016-03-28 ENCOUNTER — Ambulatory Visit: Payer: BLUE CROSS/BLUE SHIELD | Admitting: Physical Therapy

## 2016-03-28 ENCOUNTER — Encounter: Payer: Self-pay | Admitting: Physical Therapy

## 2016-03-28 DIAGNOSIS — R293 Abnormal posture: Secondary | ICD-10-CM | POA: Diagnosis not present

## 2016-03-28 DIAGNOSIS — M542 Cervicalgia: Secondary | ICD-10-CM

## 2016-03-28 NOTE — Therapy (Signed)
[facility] [place], [place], [address] Phone: [phone]   Fax:  [phone]  Physical Therapy Treatment  Patient Details  Name: [name] [name] MRN: [id number] Date of Birth: 04-May-1960 Referring Provider: Ashok Pall MD.  Encounter Date: 03/28/2016      PT End of Session - 03/28/16 0912    Visit Number 11   Number of Visits 12   Date for PT Re-Evaluation 04/15/16   PT Start Time 0859   PT Stop Time 0946   PT Time Calculation (min) 47 min   Activity Tolerance Patient tolerated treatment well   Behavior During Therapy [facility] for tasks assessed/performed      Past Medical History:  Diagnosis Date  . 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 Chest pain    denied on 06/18/2012  . Chronic kidney disease    L - upper pole- defect, doesn't effect     . Family history of anesthesia complication    brother & neice "flat lined" during induction: neice d/t a medication given for a blood clotting problem; brother d/t "miscalculated amount of anesthesia"  . GERD (gastroesophageal reflux disease)    h/o colitis   . H. pylori infection    2012- stomach biopsy  . H/O hiatal hernia   . Headache(784.0)    using tylenol prn  . Hyperlipidemia   . Hypertension   . Hypoglycemia   . Normal cardiac stress test    pt. released fr. Dr. [name] care in 10/2011, all findings w/i normal limits  . Overweight(278.02)    Obesity  . Palpitations   . Sinusitis, acute    seen by PCP- 06/17/2012, will treat /w augmentin & tussin/DM  . Uterine fibroid    pt. states that she has a "closed cervix"     Past Surgical History:  Procedure Laterality Date  . COLONOSCOPY    . CRANIOTOMY Right 06/24/2012   Procedure: CRANIOTOMY INTRACRANIAL ANEURYSM FOR CAROTID;  Surgeon: [name], MD;  Location: [facility];  Service: Neurosurgery;  Laterality: Right;  RIGHT Pterional Craniotomy for aneurysm  . LIPOMA EXCISION     buttock 10/2010    There were no  vitals filed for this visit.      Subjective Assessment - 03/28/16 0902    Subjective Patient went to MD and is to end therapy and to start new medication   Patient Stated Goals I want to get out of pain.   Currently in Pain? Yes   Pain Score 6    Pain Location Other (Comment)  cervical/thoracic   Pain Orientation Right;Left   Pain Descriptors / Indicators Discomfort   Pain Type Chronic pain   Pain Onset More than a month ago   Pain Frequency Constant   Aggravating Factors  any prolong position   Pain Relieving Factors heat                         [facility] Adult PT Treatment/Exercise - 03/28/16 0001      Moist Heat Therapy   Number Minutes Moist Heat 15 Minutes   Moist Heat Location --  thoracic     Electrical Stimulation   Electrical Stimulation Location bil mid to lower thoracic paraspinals   Electrical Stimulation Action premod   Electrical Stimulation Parameters 80-150hz  x39mn   Electrical Stimulation Goals Pain;Tone     Ultrasound   Ultrasound Location bil mid to low thoracic   Ultrasound Parameters 1.5w/cm2/50%/134m x1043m  Ultrasound Goals Pain     Manual Therapy   Manual Therapy Soft tissue mobilization   Soft tissue mobilization STW to bil UT/scap and  mid to lower thoracic paraspinals to decrease pain and tightness                PT Education - 03/28/16 0917    Education provided Yes   Education Details HEP   Person(s) Educated Patient   Methods Explanation;Demonstration;Handout   Comprehension Verbalized understanding;Returned demonstration             PT Long Term Goals - 03/28/16 0913      PT LONG TERM GOAL #1   Title Independent with an HEP.   Time 6   Period Weeks   Status Achieved     PT LONG TERM GOAL #2   Title Increase active cervical rotation to 70 degrees+ so patient can turn head more easily while driving.   Time 6   Period Weeks   Status Not Met  AROM bil cervical rotation 60 degrees 03/28/16      PT LONG TERM GOAL #3   Title Perform ADL's with pain not > 3/10.   Time 6   Period Weeks   Status Not Met  3/10 up to 6/10 pain with ADL's 03/28/16               Plan - 03/28/16 0915    Clinical Impression Statement Patient has progressed with improved ROM and less pain overall yet unable to meet all current goals due to ongoing pain deficts. Patient is independent with HEP. Patient will be DC today per ME and starting new medications to assist with her ongoing pain.   Rehab Potential Good   PT Frequency 2x / week   PT Duration 6 weeks   PT Treatment/Interventions ADLs/Self Care Home Management;Cryotherapy;Electrical Stimulation;Ultrasound;Moist Heat;Therapeutic activities;Therapeutic exercise;Patient/family education;Passive range of motion;Manual techniques;Dry needling   PT Next Visit Plan DC per MD   Consulted and Agree with Plan of Care Patient      Patient will benefit from skilled therapeutic intervention in order to improve the following deficits and impairments:  Pain, Decreased activity tolerance, Decreased range of motion  Visit Diagnosis: Cervicalgia  Abnormal posture     Problem List Patient Active Problem List   Diagnosis Date Noted  . Cervical neuralgia 03/25/2016  . Tension headache 03/25/2016  . Chest tightness or pressure 04/03/2014  . Dizziness 04/03/2014  . Headache 04/03/2014  . Chest pain 04/02/2014  . GERD (gastroesophageal reflux disease) 04/02/2014  . Meningitis, unspecified(322.9) 08/11/2012  . 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 HLD (hyperlipidemia) 01/05/2009  . OVERWEIGHT/OBESITY 01/05/2009  . Essential hypertension 01/05/2009  . Palpitations 01/05/2009  . CHEST PAIN-UNSPECIFIED 01/05/2009    APPLEGATE, [place], PTA 03/28/2016, 6:03 PM     [facility] [address] [place], [place], [address] Phone: [phone]   Fax:  [phone]  Name: [name] [name] MRN:  [id number] Date of Birth: 1961/01/23  PHYSICAL THERAPY DISCHARGE SUMMARY  Visits from Start of Care: 11. Current functional level related to goals / functional outcomes: See above.   Remaining deficits: Continued loss of cervical ROM and continued pain.   Education / Equipment: HEP. Plan: Patient agrees to discharge.  Patient goals were partially met. Patient is being discharged due to the physician's request.  ?????         Mali Applegate MPT

## 2016-03-28 NOTE — Patient Instructions (Signed)
AROM: Neck Rotation   Turn head slowly to look over one shoulder, then the other. Hold each position _10___ seconds. Repeat _5___ times per set. Do __2__ sets per session. Do _2-3___ sessions per day.  http://orth.exer.us/294   Copyright  VHI. All rights reserved.  AROM: Lateral Neck Flexion   Slowly tilt head toward one shoulder, then the other. Hold each position _10___ seconds. Repeat __5__ times per set. Do __2__ sets per session. Do __2-3__ sessions per day.  http://orth.exer.us/296   Copyright  VHI. All rights reserved.  Stretch Break - Chin Tuck   Looking straight forward, tuck chin and hold __10__ seconds. Relax and return to starting position. Repeat __5-10__ times every _3-4___ hours.  Copyright  VHI. All rights reserved.  Stretch Break - Chest and Shoulder Stretch   Maintaining erect posture, draw shoulders back while bringing elbows back and inward. Return to starting position. Repeat __10-20__ times every _3-4___ hours.  Copyright  VHI. All rights reserved.    

## 2016-04-03 ENCOUNTER — Encounter (INDEPENDENT_AMBULATORY_CARE_PROVIDER_SITE_OTHER): Payer: Self-pay | Admitting: *Deleted

## 2016-04-03 ENCOUNTER — Ambulatory Visit (INDEPENDENT_AMBULATORY_CARE_PROVIDER_SITE_OTHER): Payer: BLUE CROSS/BLUE SHIELD | Admitting: Internal Medicine

## 2016-04-03 ENCOUNTER — Encounter (INDEPENDENT_AMBULATORY_CARE_PROVIDER_SITE_OTHER): Payer: Self-pay | Admitting: Internal Medicine

## 2016-04-03 VITALS — BP 112/74 | HR 64 | Temp 98.2°F | Ht 62.0 in | Wt 206.6 lb

## 2016-04-03 DIAGNOSIS — R1310 Dysphagia, unspecified: Secondary | ICD-10-CM | POA: Diagnosis not present

## 2016-04-03 DIAGNOSIS — A048 Other specified bacterial intestinal infections: Secondary | ICD-10-CM

## 2016-04-03 DIAGNOSIS — R1319 Other dysphagia: Secondary | ICD-10-CM

## 2016-04-03 NOTE — Progress Notes (Signed)
Subjective:    Patient ID: [name] [name], female    DOB: 09-Feb-1961, 55 y.o.   MRN: [id number]  HPI Referred by Dr Alain Honey dysphagia. Foods are slow to go down. She has to move around for the food to down. She bend over to her left side to burp. She has tightness in her chest.  Symptoms  Hx of H pylori x 2 in the past. (Treated x 2 by Dr. Britta Mccreedy).  Acid reflux controlled with Protonix. Her appetite is good. No weight loss.  She says sometimes when she eats she feels better. She has a BM about daily.  Has seen Dr. Britta Mccreedy for this in the past. EGD about 18 months ago. She had a [place] about 2 years and said she had polyp on her gallbladder.   Review of Systems Past Medical History:  Diagnosis Date  . 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 Chest pain    denied on 06/18/2012  . Chronic kidney disease    L - upper pole- defect, doesn't effect     . Family history of anesthesia complication    brother & neice "flat lined" during induction: neice d/t a medication given for a blood clotting problem; brother d/t "miscalculated amount of anesthesia"  . Fibromyalgia   . GERD (gastroesophageal reflux disease)    h/o colitis   . H. pylori infection    2012- stomach biopsy  . H/O hiatal hernia   . Headache(784.0)    using tylenol prn  . Hyperlipidemia   . Hypertension   . Hypoglycemia   . Normal cardiac stress test    pt. released fr. Dr. [name] care in 10/2011, all findings w/i normal limits  . Overweight(278.02)    Obesity  . Palpitations   . Sinusitis, acute    seen by PCP- 06/17/2012, will treat /w augmentin & tussin/DM  . Uterine fibroid    pt. states that she has a "closed cervix"     Past Surgical History:  Procedure Laterality Date  . COLONOSCOPY    . CRANIOTOMY Right 06/24/2012   Procedure: CRANIOTOMY INTRACRANIAL ANEURYSM FOR CAROTID;  Surgeon: [name], MD;  Location: [place];  Service: Neurosurgery;  Laterality: Right;  RIGHT Pterional Craniotomy for aneurysm    . LIPOMA EXCISION     buttock 10/2010    Allergies  Allergen Reactions  . Zithromax [Azithromycin] Anaphylaxis  . Ace Inhibitors Swelling  . Ciprofloxacin Nausea And Vomiting    Also: kidney failure   . Other Swelling, Other (See Comments) and Cough    Sneezing allergy to cats/grass/dust  . Medrol [Methylprednisolone] Palpitations    Heart racing, increased BP  . Tetracyclines & Related Palpitations    Heart racing, increased bp    Current Outpatient Prescriptions on File Prior to Visit  Medication Sig Dispense Refill  . acetaminophen (TYLENOL) 650 MG CR tablet Take 1,300 mg by mouth every 8 (eight) hours as needed for pain or fever.     [name] aspirin 81 MG tablet Take 1 tablet (81 mg total) by mouth daily. 30 tablet 0  . Cholecalciferol (D 1000) 1000 UNITS tablet Take 1,000 Units by mouth every evening.     . diltiazem (CARDIZEM LA) 240 MG 24 hr tablet Take 180 mg in the morning and 240 mg in the evening (Patient taking differently: Take 180-240 mg by mouth 2 (two) times daily. Take 180 mg in the morning and 240 mg in the evening) 180 tablet 3  .  hydrALAZINE (APRESOLINE) 50 MG tablet Take 50 mg by mouth daily.     [name] loratadine (CLARITIN) 10 MG tablet Take 10 mg by mouth daily.     . metoprolol (LOPRESSOR) 50 MG tablet Take 50 mg by mouth 2 (two) times daily.    . pantoprazole (PROTONIX) 40 MG tablet Take 40 mg by mouth daily.  1  . PROAIR HFA 108 (90 Base) MCG/ACT inhaler INHALE 2 PUFF EVERY 4 HOURS AS NEEDED  1  . DULoxetine (CYMBALTA) 30 MG capsule Take 1 capsule (30 mg total) by mouth daily. 7 capsule 0   No current facility-administered medications on file prior to visit.        Objective:   Physical Exam Blood pressure 112/74, pulse 64, temperature 98.2 F (36.8 C), height 5\' 2"  (1.575 m), weight 206 lb 9.6 oz (93.7 kg), last menstrual period 08/25/2012.  Alert and oriented. Skin warm and dry. Oral mucosa is moist.   . Sclera anicteric, conjunctivae is pink. Thyroid not  enlarged. No cervical lymphadenopathy. Lungs clear. Heart regular rate and rhythm.  Abdomen is soft. Bowel sounds are positive. No hepatomegaly. No abdominal masses felt. No tenderness.  No edema to lower extremities.         Assessment & Plan:  Dysphagia: Am going to a DG esophagram. Will get EGD and DG esophagram from [facility]

## 2016-04-03 NOTE — Patient Instructions (Signed)
DG Esophagram.  

## 2016-04-05 ENCOUNTER — Other Ambulatory Visit: Payer: Self-pay | Admitting: *Deleted

## 2016-04-05 MED ORDER — DULOXETINE HCL 60 MG PO CPEP
60.0000 mg | ORAL_CAPSULE | Freq: Every day | ORAL | 1 refills | Status: DC
Start: 2016-04-05 — End: 2016-10-15

## 2016-04-09 ENCOUNTER — Encounter (INDEPENDENT_AMBULATORY_CARE_PROVIDER_SITE_OTHER): Payer: Self-pay

## 2016-04-10 ENCOUNTER — Ambulatory Visit (HOSPITAL_COMMUNITY)
Admission: RE | Admit: 2016-04-10 | Discharge: 2016-04-10 | Disposition: A | Discharge status: Home / Self Care | Payer: BLUE CROSS/BLUE SHIELD | Source: Ambulatory Visit | Attending: Internal Medicine | Admitting: Internal Medicine

## 2016-04-10 DIAGNOSIS — R1310 Dysphagia, unspecified: Secondary | ICD-10-CM | POA: Diagnosis not present

## 2016-04-10 DIAGNOSIS — R1319 Other dysphagia: Secondary | ICD-10-CM

## 2016-04-16 ENCOUNTER — Encounter (INDEPENDENT_AMBULATORY_CARE_PROVIDER_SITE_OTHER): Payer: Self-pay | Admitting: Internal Medicine

## 2016-04-16 NOTE — Progress Notes (Signed)
Patient was given an appointment with Deberah Castle, NP for 10/15/16 at 1:45pm.  A letter was mailed to the patient.

## 2016-05-14 ENCOUNTER — Encounter: Payer: Self-pay | Admitting: Family Medicine

## 2016-05-14 ENCOUNTER — Ambulatory Visit (INDEPENDENT_AMBULATORY_CARE_PROVIDER_SITE_OTHER): Payer: BLUE CROSS/BLUE SHIELD | Admitting: Family Medicine

## 2016-05-14 VITALS — BP 109/73 | HR 53 | Temp 98.1°F | Ht 62.0 in | Wt 206.0 lb

## 2016-05-14 DIAGNOSIS — J0100 Acute maxillary sinusitis, unspecified: Secondary | ICD-10-CM

## 2016-05-14 DIAGNOSIS — Z91013 Allergy to seafood: Secondary | ICD-10-CM

## 2016-05-14 DIAGNOSIS — R059 Cough, unspecified: Secondary | ICD-10-CM

## 2016-05-14 DIAGNOSIS — R05 Cough: Secondary | ICD-10-CM | POA: Diagnosis not present

## 2016-05-14 MED ORDER — AMOXICILLIN-POT CLAVULANATE 875-125 MG PO TABS: 1.0000 | ORAL_TABLET | Freq: Two times a day (BID) | ORAL | 0 refills | Status: DC

## 2016-05-14 NOTE — Progress Notes (Signed)
   HPI  Patient presents today here with concern for shellfish allergy, also cough and concern for sinus infection.  Shellfish allergy Patient ate shrimp 2 weeks ago with a reaction described as "pop [place]" on her tongue. She had tongue irritation that lasted 30-60 minutes this resolved spontaneously. The following day she developed several small bumps between her teeth in her lower lip. She had no tongue swelling or throat swelling. She had no difficulty breathing.  Concern for sinus infection Complains of frontal sinus and maxillary sinus pain and pressure over the last week or 2 She's also had a cough.  She is not using medications currently.  She is not having shortness of breath or chest pain. She does not have any fever. She's concerned she had the flu about a week ago that has began to improve.  Tolerating food and fluids normally.  PMH: Smoking status noted ROS: Per HPI  Objective: BP 109/73   Pulse (!) 53   Temp 98.1 F (36.7 C) (Oral)   Ht 5\' 2"  (1.575 m)   Wt 206 lb (93.4 kg)   LMP 08/25/2012   BMI 37.68 kg/m  Gen: NAD, alert, cooperative with exam HEENT: NCAT, ear swelling of the turbinates bilaterally, TMs normal bilaterally, left-sided frontal and maxillary tenderness to palpation, oropharynx moist and clear with no visible vesicles CV: RRR, good S1/S2, no murmur Resp: CTABL, no wheezes, non-labored Abd: SNTND, BS present, no guarding or organomegaly Ext: No edema, warm Neuro: Alert and oriented, No gross deficits  Assessment and plan:  # Shellfish allergy Likely, patient would like testing Referral to allergy  # Acute maxillary Sinusitis Treat with Augmentin, recommended starting Flonase of turbinate swelling  # Cough Most likely postnasal drip, also possibly post viral inflammation of the lungs. Recommended Flonase   At the end of the visit patient also asked several questions about Cymbalta, and has been prescribed to her previously, recommended  trying the medication starting at 30 mg once daily. For fibromyalgia/complex regional pain syndrome type symptoms which she describes to me.    Orders Placed This Encounter  Procedures  . Ambulatory referral to Allergy    Referral Priority:   Routine    Referral Type:   Allergy Testing    Referral Reason:   Specialty Services Required    Requested Specialty:   Allergy    Number of Visits Requested:   1    Meds ordered this encounter  Medications  . amoxicillin-clavulanate (AUGMENTIN) 875-125 MG tablet    Sig: Take 1 tablet by mouth 2 (two) times daily.    Dispense:  20 tablet    Refill:  0    Laroy Apple, MD [facility] 05/14/2016, 8:20 AM

## 2016-05-14 NOTE — Patient Instructions (Signed)
Great to meet you!  Try flonase 2 sprays each nostril once daily for a few weeks.  Finish all antibiotics  We will work on a referral for you and give you a call when it is arranged.    Sinusitis, Adult Sinusitis is soreness and inflammation of your sinuses. Sinuses are hollow spaces in the bones around your face. They are located:  Around your eyes.  In the middle of your forehead.  Behind your nose.  In your cheekbones. Your sinuses and nasal passages are lined with a stringy fluid (mucus). Mucus normally drains out of your sinuses. When your nasal tissues get inflamed or swollen, the mucus can get trapped or blocked so air cannot flow through your sinuses. This lets bacteria, viruses, and funguses grow, and that leads to infection. Follow these instructions at home: Medicines  Take, use, or apply over-the-counter and prescription medicines only as told by your doctor. These may include nasal sprays.  If you were prescribed an antibiotic medicine, take it as told by your doctor. Do not stop taking the antibiotic even if you start to feel better. Hydrate and Humidify  Drink enough water to keep your pee (urine) clear or pale yellow.  Use a cool mist humidifier to keep the humidity level in your home above 50%.  Breathe in steam for 10-15 minutes, 3-4 times a day or as told by your doctor. You can do this in the bathroom while a hot shower is running.  Try not to spend time in cool or dry air. Rest  Rest as much as possible.  Sleep with your head raised (elevated).  Make sure to get enough sleep each night. General instructions  Put a warm, moist washcloth on your face 3-4 times a day or as told by your doctor. This will help with discomfort.  Wash your hands often with soap and water. If there is no soap and water, use hand sanitizer.  Do not smoke. Avoid being around people who are smoking (secondhand smoke).  Keep all follow-up visits as told by your doctor. This  is important. Contact a doctor if:  You have a fever.  Your symptoms get worse.  Your symptoms do not get better within 10 days. Get help right away if:  You have a very bad headache.  You cannot stop throwing up (vomiting).  You have pain or swelling around your face or eyes.  You have trouble seeing.  You feel confused.  Your neck is stiff.  You have trouble breathing. This information is not intended to replace advice given to you by your health care provider. Make sure you discuss any questions you have with your health care provider. Document Released: 10/02/2007 Document Revised: 12/10/2015 Document Reviewed: 02/08/2015 Elsevier Interactive Patient Education  2017 [employer].

## 2016-05-24 ENCOUNTER — Telehealth: Payer: Self-pay | Admitting: Family Medicine

## 2016-05-24 NOTE — Telephone Encounter (Signed)
[email or web address]

## 2016-05-27 NOTE — Telephone Encounter (Signed)
Left message, referral had been cancelled by [facility] pulmonology. Our office is working on referral again.

## 2016-06-09 DIAGNOSIS — E785 Hyperlipidemia, unspecified: Secondary | ICD-10-CM | POA: Diagnosis not present

## 2016-06-09 DIAGNOSIS — M797 Fibromyalgia: Secondary | ICD-10-CM | POA: Diagnosis not present

## 2016-06-09 DIAGNOSIS — R112 Nausea with vomiting, unspecified: Secondary | ICD-10-CM | POA: Diagnosis not present

## 2016-06-09 DIAGNOSIS — M549 Dorsalgia, unspecified: Secondary | ICD-10-CM | POA: Diagnosis not present

## 2016-06-09 DIAGNOSIS — Z7982 Long term (current) use of aspirin: Secondary | ICD-10-CM | POA: Diagnosis not present

## 2016-06-09 DIAGNOSIS — Z8249 Family history of ischemic heart disease and other diseases of the circulatory system: Secondary | ICD-10-CM | POA: Diagnosis not present

## 2016-06-09 DIAGNOSIS — R1110 Vomiting, unspecified: Secondary | ICD-10-CM | POA: Diagnosis not present

## 2016-06-09 DIAGNOSIS — R197 Diarrhea, unspecified: Secondary | ICD-10-CM | POA: Diagnosis not present

## 2016-06-09 DIAGNOSIS — I10 Essential (primary) hypertension: Secondary | ICD-10-CM | POA: Diagnosis not present

## 2016-06-09 DIAGNOSIS — R1013 Epigastric pain: Secondary | ICD-10-CM | POA: Diagnosis not present

## 2016-06-09 DIAGNOSIS — J45909 Unspecified asthma, uncomplicated: Secondary | ICD-10-CM | POA: Diagnosis not present

## 2016-06-09 DIAGNOSIS — Z79899 Other long term (current) drug therapy: Secondary | ICD-10-CM | POA: Diagnosis not present

## 2016-06-09 DIAGNOSIS — R1011 Right upper quadrant pain: Secondary | ICD-10-CM | POA: Diagnosis not present

## 2016-06-09 DIAGNOSIS — K219 Gastro-esophageal reflux disease without esophagitis: Secondary | ICD-10-CM | POA: Diagnosis not present

## 2016-06-09 DIAGNOSIS — R1084 Generalized abdominal pain: Secondary | ICD-10-CM | POA: Diagnosis not present

## 2016-06-12 DIAGNOSIS — J4530 Mild persistent asthma, uncomplicated: Secondary | ICD-10-CM | POA: Diagnosis not present

## 2016-06-12 DIAGNOSIS — R05 Cough: Secondary | ICD-10-CM | POA: Diagnosis not present

## 2016-06-12 DIAGNOSIS — J309 Allergic rhinitis, unspecified: Secondary | ICD-10-CM | POA: Diagnosis not present

## 2016-06-17 DIAGNOSIS — E782 Mixed hyperlipidemia: Secondary | ICD-10-CM | POA: Diagnosis not present

## 2016-06-17 DIAGNOSIS — I10 Essential (primary) hypertension: Secondary | ICD-10-CM | POA: Diagnosis not present

## 2016-06-17 DIAGNOSIS — G43909 Migraine, unspecified, not intractable, without status migrainosus: Secondary | ICD-10-CM | POA: Diagnosis not present

## 2016-06-17 DIAGNOSIS — I671 Cerebral aneurysm, nonruptured: Secondary | ICD-10-CM | POA: Diagnosis not present

## 2016-06-19 DIAGNOSIS — R079 Chest pain, unspecified: Secondary | ICD-10-CM | POA: Diagnosis not present

## 2016-06-19 DIAGNOSIS — Z7982 Long term (current) use of aspirin: Secondary | ICD-10-CM | POA: Diagnosis not present

## 2016-06-19 DIAGNOSIS — R42 Dizziness and giddiness: Secondary | ICD-10-CM | POA: Diagnosis not present

## 2016-06-19 DIAGNOSIS — R930 Abnormal findings on diagnostic imaging of skull and head, not elsewhere classified: Secondary | ICD-10-CM | POA: Diagnosis not present

## 2016-06-19 DIAGNOSIS — N183 Chronic kidney disease, stage 3 (moderate): Secondary | ICD-10-CM | POA: Diagnosis not present

## 2016-06-19 DIAGNOSIS — R791 Abnormal coagulation profile: Secondary | ICD-10-CM | POA: Diagnosis not present

## 2016-06-19 DIAGNOSIS — K219 Gastro-esophageal reflux disease without esophagitis: Secondary | ICD-10-CM | POA: Diagnosis not present

## 2016-06-19 DIAGNOSIS — I129 Hypertensive chronic kidney disease with stage 1 through stage 4 chronic kidney disease, or unspecified chronic kidney disease: Secondary | ICD-10-CM | POA: Diagnosis not present

## 2016-06-19 DIAGNOSIS — M25511 Pain in right shoulder: Secondary | ICD-10-CM | POA: Diagnosis not present

## 2016-06-19 DIAGNOSIS — Z79899 Other long term (current) drug therapy: Secondary | ICD-10-CM | POA: Diagnosis not present

## 2016-06-19 DIAGNOSIS — J45909 Unspecified asthma, uncomplicated: Secondary | ICD-10-CM | POA: Diagnosis not present

## 2016-06-19 DIAGNOSIS — R51 Headache: Secondary | ICD-10-CM | POA: Diagnosis not present

## 2016-06-19 DIAGNOSIS — E669 Obesity, unspecified: Secondary | ICD-10-CM | POA: Diagnosis not present

## 2016-06-19 DIAGNOSIS — R7989 Other specified abnormal findings of blood chemistry: Secondary | ICD-10-CM | POA: Diagnosis not present

## 2016-06-19 DIAGNOSIS — R110 Nausea: Secondary | ICD-10-CM | POA: Diagnosis not present

## 2016-06-19 DIAGNOSIS — N289 Disorder of kidney and ureter, unspecified: Secondary | ICD-10-CM | POA: Diagnosis not present

## 2016-06-19 DIAGNOSIS — Z6821 Body mass index (BMI) 21.0-21.9, adult: Secondary | ICD-10-CM | POA: Diagnosis not present

## 2016-06-19 DIAGNOSIS — M79621 Pain in right upper arm: Secondary | ICD-10-CM | POA: Diagnosis not present

## 2016-06-20 DIAGNOSIS — R079 Chest pain, unspecified: Secondary | ICD-10-CM | POA: Diagnosis not present

## 2016-06-20 DIAGNOSIS — R7989 Other specified abnormal findings of blood chemistry: Secondary | ICD-10-CM | POA: Diagnosis not present

## 2016-06-20 DIAGNOSIS — R42 Dizziness and giddiness: Secondary | ICD-10-CM | POA: Diagnosis not present

## 2016-06-24 DIAGNOSIS — I671 Cerebral aneurysm, nonruptured: Secondary | ICD-10-CM | POA: Diagnosis not present

## 2016-06-28 DIAGNOSIS — I671 Cerebral aneurysm, nonruptured: Secondary | ICD-10-CM | POA: Diagnosis not present

## 2016-06-28 DIAGNOSIS — J019 Acute sinusitis, unspecified: Secondary | ICD-10-CM | POA: Diagnosis not present

## 2016-06-28 DIAGNOSIS — Z6836 Body mass index (BMI) 36.0-36.9, adult: Secondary | ICD-10-CM | POA: Diagnosis not present

## 2016-07-02 ENCOUNTER — Ambulatory Visit (INDEPENDENT_AMBULATORY_CARE_PROVIDER_SITE_OTHER): Payer: BLUE CROSS/BLUE SHIELD | Admitting: Neurology

## 2016-07-02 ENCOUNTER — Encounter: Payer: Self-pay | Admitting: Neurology

## 2016-07-02 VITALS — BP 127/78 | HR 56 | Ht 62.0 in | Wt 206.2 lb

## 2016-07-02 DIAGNOSIS — M5412 Radiculopathy, cervical region: Secondary | ICD-10-CM | POA: Diagnosis not present

## 2016-07-02 DIAGNOSIS — I671 Cerebral aneurysm, nonruptured: Secondary | ICD-10-CM

## 2016-07-02 DIAGNOSIS — I10 Essential (primary) hypertension: Secondary | ICD-10-CM | POA: Diagnosis not present

## 2016-07-02 DIAGNOSIS — G44209 Tension-type headache, unspecified, not intractable: Secondary | ICD-10-CM | POA: Diagnosis not present

## 2016-07-02 NOTE — Progress Notes (Signed)
NEUROLOGY CLINIC FOLLOW UP NOTE  NAME: [name] [name] DOB: Mar 12, 1961  HPI: [name] [name] is a 56 y.o. female with PMH of HTN, HLD, right ICA terminus aneurysm s/p clipping in 2013 and headache who presents as a new patient for Headache.   Patient was following with Dr.penumalli in 2013 for headache. At that time, she complained of intermittent headache, right or left-sided, associated with neck pain, left shoulder pain, nausea, blurred vision, and dizziness. Headaches were proceeded by feeding of sore spots over the scalp. She took Tylenol for headache. However she had MRI and MRA at that time showed 45 mm terminal right ICA aneurysm. MRI C-spine showed minimal degenerative changes without significant spinal stenosis. She underwent aneurysm clipping by Dr. Cyndy Freeze and after that her headache was gone for several years.  However, since 6 months ago, the headache gradually coming back, the headache was very similar to previous headache, starting with lower neck pain, bilateral shoulder pain, up to the back of her head, either left or right-sided with neck tightness. Headache more pressure, dull feeling, sometimes lasting 2-3 days. Tylenol works if takes early. For the last 2 weeks, headache seems getting worse. At that same time he had right hand cramping after overexerting the right hand in a party, as well as sometimes left arm tingling. Went to see PCP, had a stress test which was negative and also had MRI/MRA showed previous aneurysm clipping without change, however, also showed symmetric mild signal along bilateral corticospinal tracts, progressed from 2015. It reported this finding is nonspecific however may be seen associated with ALS. Due to this abnormal findings, patient was referred here for further evaluation.  She has history of hypertension, on Cardizem, hydralazine, and metoprolol. Recently her blood pressure was on the lower side and her PCP is trying to taper off hydralazine.  Today blood pressure 99/60. Questionable history of fibromyalgia. He is also on aspirin daily. Denies any alcohol smoking or using illicit drugs.  03/25/16 follow up - she has had some improvement with PT/OT. However, she still has intermittent mild HA at back of head and neck pain and tight. She sometimes has tenderness along the occipital nerve distribution. She did not start amitriptyline as she had confusion with dosing. She uses lidocaine cream PRN. She admitted that in the morning seems less symptoms and as the day goes by, she may feel more symptoms. She told me that she also has fibromyalgia. BP today 115/75.   Interval history: During the interval time, pt has been doing well. HA intermittent but improved. Currently no HA. However, on 06/19/16, she was at work, suddenly she had one sharp pain at right side of face, lasted several min, and she also started to have right arm and right chest pain, BP up, she got up and walking out to hallway, about to pass out, her co-worker called EMS and she was sent to ED. At ED, her right facial pain gone, but started to have right dull HA, and still has some mild right arm and chest pain, EKG negative. She had MRI again demonstrated b/l corticospinal tract hyper T2 signal, unchanged. MRA neck negative but MRA head showed left supraclinoid ICA 30mm, concerning for small aneurysm. She followed with NSG Dr. Christella Noa and no intervention needed and will consider CTA head if needed. However, when comparing with her CTA head in 2014, it was unchanged. Pt came in today want to seek my opinion on that.   Past Medical History:  Diagnosis Date  .  Aneurysm ([place])    brain  . Chest pain    denied on 06/18/2012  . Chronic kidney disease    L - upper pole- defect, doesn't effect     . Family history of anesthesia complication    brother & neice "flat lined" during induction: neice d/t a medication given for a blood clotting problem; brother d/t "miscalculated amount of  anesthesia"  . Fibromyalgia   . GERD (gastroesophageal reflux disease)    h/o colitis   . H. pylori infection    2012- stomach biopsy  . H/O hiatal hernia   . Headache(784.0)    using tylenol prn  . Hyperlipidemia   . Hypertension   . Hypoglycemia   . Normal cardiac stress test    pt. released fr. Dr. [name] care in 10/2011, all findings w/i normal limits  . Overweight(278.02)    Obesity  . Palpitations   . Sinusitis, acute    seen by PCP- 06/17/2012, will treat /w augmentin & tussin/DM  . Uterine fibroid    pt. states that she has a "closed cervix"    Past Surgical History:  Procedure Laterality Date  . COLONOSCOPY    . CRANIOTOMY Right 06/24/2012   Procedure: CRANIOTOMY INTRACRANIAL ANEURYSM FOR CAROTID;  Surgeon: [name], MD;  Location: [facility];  Service: Neurosurgery;  Laterality: Right;  RIGHT Pterional Craniotomy for aneurysm  . LIPOMA EXCISION     buttock 10/2010   Family History  Problem Relation Age of Onset  . Hypertension Other   . Coronary artery disease Other   . Dementia Mother   . Dementia Maternal Aunt    Current Outpatient Prescriptions  Medication Sig Dispense Refill  . acetaminophen (TYLENOL) 650 MG CR tablet Take 1,300 mg by mouth every 8 (eight) hours as needed for pain or fever.     [name] amoxicillin-clavulanate (AUGMENTIN) 875-125 MG tablet Take 1 tablet by mouth 2 (two) times daily. 20 tablet 0  . aspirin 81 MG tablet Take 1 tablet (81 mg total) by mouth daily. 30 tablet 0  . Cholecalciferol (D 1000) 1000 UNITS tablet Take 1,000 Units by mouth every evening.     . diltiazem (CARDIZEM LA) 240 MG 24 hr tablet Take 180 mg in the morning and 240 mg in the evening (Patient taking differently: Take 180-240 mg by mouth 2 (two) times daily. Take 180 mg in the morning and 240 mg in the evening) 180 tablet 3  . DULoxetine (CYMBALTA) 60 MG capsule Take 1 capsule (60 mg total) by mouth daily. 90 capsule 1  . loratadine (CLARITIN) 10 MG tablet Take 10 mg  by mouth daily.     . metoprolol (LOPRESSOR) 50 MG tablet Take 50 mg by mouth 2 (two) times daily.    . pantoprazole (PROTONIX) 40 MG tablet Take 40 mg by mouth daily.  1  . PROAIR HFA 108 (90 Base) MCG/ACT inhaler INHALE 2 PUFF EVERY 4 HOURS AS NEEDED  1   No current facility-administered medications for this visit.    Allergies  Allergen Reactions  . Zithromax [Azithromycin] Anaphylaxis  . Ace Inhibitors Swelling  . Ciprofloxacin Nausea And Vomiting    Also: kidney failure   . Other Swelling, Other (See Comments) and Cough    Sneezing allergy to cats/grass/dust  . Medrol [Methylprednisolone] Palpitations    Heart racing, increased BP  . Tetracyclines & Related Palpitations    Heart racing, increased bp   Social History   Social History  . Marital  status: Single    Spouse name: N/A  . Number of children: N/A  . Years of education: N/A   Occupational History  . Full time [employer] History Main Topics  . Smoking status: Former Smoker    Packs/day: 0.80    Years: 15.00    Types: Cigarettes    Start date: 02/13/1989    Quit date: 04/28/2004  . Smokeless tobacco: Never Used  . Alcohol use No  . Drug use: No  . Sexual activity: No   Other Topics Concern  . Not on file   Social History Narrative   Single    Review of Systems Full 14 system review of systems performed and notable only for those listed, all others are neg:  Constitutional:   Cardiovascular:  Ear/Nose/Throat:  Ringing in ears  Skin:  Eyes:   Respiratory:  SOB, cough  Gastroitestinal:  Diarrhea, N/V Genitourinary:  Hematology/Lymphatic:   Endocrine:  Musculoskeletal:  Joint pain, back pain, neck pain Allergy/Immunology:   Neurological:  Headache, dizziness  Psychiatric:  Sleep: Snoring   Physical Exam  Vitals:   07/02/16 1021  BP: 127/78  Pulse: (!) 56    General - Well nourished, well developed, in no apparent distress.  Ophthalmologic - Sharp disc margins  OU.  Cardiovascular - Regular rate and rhythm with no murmur. Carotid pulses were 2+ without bruits.   Neck - supple, no nuchal rigidity.   Mental Status -  Level of arousal and orientation to time, place, and person were intact. Language including expression, naming, repetition, comprehension, reading, and writing was assessed and found intact. Attention span and concentration were normal. Recent and remote memory were intact. Fund of Knowledge was assessed and was intact.  Cranial Nerves II - XII - II - Visual field intact OU. III, IV, VI - Extraocular movements intact. V - Facial sensation intact bilaterally. VII - Facial movement intact bilaterally. VIII - Hearing & vestibular intact bilaterally. X - Palate elevates symmetrically. XI - Chin turning & shoulder shrug intact bilaterally. XII - Tongue protrusion intact.  Motor Strength - The patient's strength was normal in all extremities and pronator drift was absent.  Bulk was normal and fasciculations were absent.   Motor Tone - Muscle tone was assessed at the neck and appendages and was normal.  Reflexes - The patient's reflexes were normal in all extremities and she had no pathological reflexes.  Sensory - Light touch, temperature/pinprick, vibration and proprioception, and Romberg testing were assessed and were normal.    Coordination - The patient had normal movements in the hands and feet with no ataxia or dysmetria.  Tremor was absent.  Gait and Station - The patient's transfers, posture, gait, station, and turns were observed as normal.   Imaging  I have personally reviewed the radiological images below and agree with the radiology interpretations.  MRI with and without contrast 12/14/2015   no acute intracranial process. Symmetric mild abnormal signal along bilateral cortical spinal tracts, progress from 2015. Nonspecific, though associate with ALS. S/p right carotid terminus aneurysm clipping.  CT head  12/14/2015 Previous aneurysm clipping placement the right MCA region. No intracranial mass, hemorrhage, or extra axial fluid collection. The gray-white compartments appears normal. No acute infarct evident. There is a mild paranasal sinus disease.  CT cervical spine 12/14/2015 No fracture or spondylolisthesis  MRA 01/09/2012 There is a 45 mm terminal right ICA aneurysm. No signal change from MRA head on 06/06/2011.  MRI cervical spine 02/06/2012 Minimal  degenerative changes without significant spinal stenosis, cord compression, foraminal narrowing or nerve root compression. The right carotid terminal aneurysm is partially visualized on the present exam. There is not adequately accessed by the present technique. No obvious hemorrhage is noted.  MRI brain with and without contrast 06/19/16 1. No acute intracranial process 2. Symmetric mildly abnormal signal involving the bilateral cortical spinal tracts, similar to previous, again, finding is nonspecific, but can be seen in the setting of ALS  MRA head 06/19/16 1. Status post right carotid terminus aneurysm clipping. No residue aneurysm identified 2. 3 mm focal outpouching arising from the supraclinoid left ICA, which may reflect a small aneurysm or vascular infundibulum associated with a hypoplastic left PCOM. This would better imaged via CTA.  MRA neck 06/19/16 Normal MRA neck    Lab Review None    Assessment and Plan:   In summary, [name] [name] is a 56 y.o. female with PMH of HTN, HLD, right ICA terminus aneurysm s/p clipping in 2013 and headache who presents as a new patient for Headache. She had a similar headache in 2013, found to have right terminal ICA aneurysm s/p clipping. Headache was gone for several years but recently come back. MRI no acute process except stable right terminal ICA clipping. However found to have symmetric mild signal along bilateral corticospinal tracts, reportedly may associated with ALS. However, on my  review of images, the signals are the same as those in her MRI in 2015, no progression. Pt had another MRI on 06/19/16, which showed same finding and no progression. With clinical correlations, this is not ALS but rather normal signal. I have reassured patient. If pt has any sign of ALS, will do EMG. Pt agreed.   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 Finished PT/OT for cervicogenic pain. She also has fibromyalgia, put on cymbalta. Recommend relaxation and de-stress. Currently, HA improved.  For her 78mm left supraclinoid ICA possible aneurysm on 05/31/16 MRA, I reviewed her CTA in 2014 and it was showing similar structure and no progression. Recommend to continue monitoring and no intervention needed. I reassured pt.   Plan: - continue cymbalta for neuropathic pain. - continue lidocaine cream for pain relieve. - relaxation and cope with stress can help the neuralgia and HA - can also take tylenol as needed for tension HA and neck pain - will consider EMG/NCS if any sign of ALS.   - check BP at home and record - follow up with PCP and Dr. Cyndy Freeze for repeat CTA if needed.  - follow up in 6 months with me.   I spent more than 25 minutes of face to face time with the patient. Greater than 50% of time was spent in counseling and coordination of care. We reviewed all images since 2014, reassured MRI findings, discussed about management of aneurysm and HA treatment options.   No orders of the defined types were placed in this encounter.   No orders of the defined types were placed in this encounter.   Patient Instructions  - continue cymbalta for neuropathic pain. - continue lidocaine cream for pain relieve. - relaxation and cope with stress can help the neuralgia - can also take tylenol as needed for tension HA and neck pain - will consider nerve conduction test in the future if needed.  -  check BP at home and record - follow up with PCP and Dr. Cyndy Freeze.  - follow  up in 6 months with me.    Rosalin Hawking, MD PhD [facility] [address] [place], [place] [address] [phone]

## 2016-07-02 NOTE — Patient Instructions (Addendum)
-   continue cymbalta for neuropathic pain. - continue lidocaine cream for pain relieve. - relaxation and cope with stress can help the neuralgia - can also take tylenol as needed for tension HA and neck pain - will consider nerve conduction test in the future if needed.  - check BP at home and record - follow up with PCP and Dr. Cyndy Freeze.  - follow up in 6 months with me.

## 2016-07-16 DIAGNOSIS — I671 Cerebral aneurysm, nonruptured: Secondary | ICD-10-CM | POA: Diagnosis not present

## 2016-07-16 DIAGNOSIS — R05 Cough: Secondary | ICD-10-CM | POA: Diagnosis not present

## 2016-07-16 DIAGNOSIS — Z6836 Body mass index (BMI) 36.0-36.9, adult: Secondary | ICD-10-CM | POA: Diagnosis not present

## 2016-07-16 DIAGNOSIS — J019 Acute sinusitis, unspecified: Secondary | ICD-10-CM | POA: Diagnosis not present

## 2016-09-13 DIAGNOSIS — J019 Acute sinusitis, unspecified: Secondary | ICD-10-CM | POA: Diagnosis not present

## 2016-09-13 DIAGNOSIS — R05 Cough: Secondary | ICD-10-CM | POA: Diagnosis not present

## 2016-09-13 DIAGNOSIS — R0781 Pleurodynia: Secondary | ICD-10-CM | POA: Diagnosis not present

## 2016-09-16 DIAGNOSIS — N183 Chronic kidney disease, stage 3 (moderate): Secondary | ICD-10-CM | POA: Diagnosis not present

## 2016-09-16 DIAGNOSIS — E782 Mixed hyperlipidemia: Secondary | ICD-10-CM | POA: Diagnosis not present

## 2016-09-16 DIAGNOSIS — R5383 Other fatigue: Secondary | ICD-10-CM | POA: Diagnosis not present

## 2016-09-16 DIAGNOSIS — I10 Essential (primary) hypertension: Secondary | ICD-10-CM | POA: Diagnosis not present

## 2016-09-18 DIAGNOSIS — Z1389 Encounter for screening for other disorder: Secondary | ICD-10-CM | POA: Diagnosis not present

## 2016-09-18 DIAGNOSIS — E782 Mixed hyperlipidemia: Secondary | ICD-10-CM | POA: Diagnosis not present

## 2016-09-18 DIAGNOSIS — I10 Essential (primary) hypertension: Secondary | ICD-10-CM | POA: Diagnosis not present

## 2016-09-18 DIAGNOSIS — G43909 Migraine, unspecified, not intractable, without status migrainosus: Secondary | ICD-10-CM | POA: Diagnosis not present

## 2016-09-18 DIAGNOSIS — I671 Cerebral aneurysm, nonruptured: Secondary | ICD-10-CM | POA: Diagnosis not present

## 2016-09-27 DIAGNOSIS — R1011 Right upper quadrant pain: Secondary | ICD-10-CM | POA: Diagnosis not present

## 2016-09-27 DIAGNOSIS — R932 Abnormal findings on diagnostic imaging of liver and biliary tract: Secondary | ICD-10-CM | POA: Diagnosis not present

## 2016-09-27 DIAGNOSIS — K824 Cholesterolosis of gallbladder: Secondary | ICD-10-CM | POA: Diagnosis not present

## 2016-10-02 ENCOUNTER — Telehealth: Payer: Self-pay | Admitting: Neurology

## 2016-10-02 NOTE — Telephone Encounter (Signed)
Left vm for patient to call back during business hours about Dr. Erlinda Hong message.

## 2016-10-02 NOTE — Telephone Encounter (Signed)
Patient called office in reference to confirming appointment with Dr. Erlinda Hong in August.  Patient mentioned of having a CTA of her head re ordered due to her kidney functions being to low.  Patient states she will be seeing Dr. Cyndy Freeze and would like to know if he needs to be to order the CTA of her head.  Please call

## 2016-10-02 NOTE — Telephone Encounter (Signed)
Message sent to Dr.Xu about can CTA be order before appt.

## 2016-10-02 NOTE — Telephone Encounter (Signed)
Please let the pt know that her 05/2016 MRA showed same size small aneurysm as the CTA in 2014. I do not need to repeat CTA at this time. At most we can repeat CTA next February for monitoring. However, if she sees Dr. Cyndy Freeze and he would like to repeat CTA and he can order that for her. Thanks.   Rosalin Hawking, MD PhD Stroke Neurology 10/02/2016 4:41 PM

## 2016-10-03 NOTE — Telephone Encounter (Signed)
Rn call patient about Dr. Erlinda Hong message from yesterday. Rn stated Dr. Erlinda Hong does not want to repeat the CTA until Feb 2019. Rn also stated the MRA showed stable aneurysm as the CTA in 2014. Pt could not get CTA last time because of low kidney function.Rn also explain per Dr. Erlinda Hong note if she would like it repeat CTA to contact Dr. Cyndy Freeze to order the test for her. Rn stated Dr Erlinda Hong is working in the hospital and will be on vacation next week.   After Rn stated Dr. Erlinda Hong did not want to repeat the test, but she can contact Dr.Cabell, pt began to complain of having symptoms for the last 6 weeks. Pt still works at her job full times. She complain of numbness in her hands, headaches at times,and right arm numbness at times. Rn stated to patient why were these symtptoms not mention to the phone staff on 10/02/2016. PT stated something was going on but she was told a nurse would call her back. Rn stated in the future always report symptoms to the phone staff or nurse so it can be evaluated. Pt stated "I have been dealing with it for 6 weeks and have been dealing with it. Rn advised pt to seek the ED if it gets worse. Pt stated she has been in the ED 3 times in the last year. Rn advised pt to call Dr. Cyndy Freeze for CTA order. PT verbalized to seek ED If sympts get worse. Pt has appt in August 2018. PT verbalized understanding.

## 2016-10-15 ENCOUNTER — Encounter (INDEPENDENT_AMBULATORY_CARE_PROVIDER_SITE_OTHER): Payer: Self-pay | Admitting: Internal Medicine

## 2016-10-15 ENCOUNTER — Ambulatory Visit (INDEPENDENT_AMBULATORY_CARE_PROVIDER_SITE_OTHER): Payer: BLUE CROSS/BLUE SHIELD | Admitting: Internal Medicine

## 2016-10-15 VITALS — BP 140/82 | HR 72 | Temp 98.4°F | Ht 62.0 in | Wt 200.8 lb

## 2016-10-15 DIAGNOSIS — K219 Gastro-esophageal reflux disease without esophagitis: Secondary | ICD-10-CM | POA: Diagnosis not present

## 2016-10-15 DIAGNOSIS — K760 Fatty (change of) liver, not elsewhere classified: Secondary | ICD-10-CM | POA: Diagnosis not present

## 2016-10-15 NOTE — Patient Instructions (Addendum)
Hepatic function.  Diet and exercise. [place] Elastrography Fatty Liver Fatty liver, also called hepatic steatosis or steatohepatitis, is a condition in which too much fat has built up in your liver cells. The liver removes harmful substances from your bloodstream. It produces fluids your body needs. It also helps your body use and store energy from the food you eat. In many cases, fatty liver does not cause symptoms or problems. It is often diagnosed when tests are being done for other reasons. However, over time, fatty liver can cause inflammation that may lead to more serious liver problems, such as scarring of the liver (cirrhosis). What are the causes? Causes of fatty liver may include:  Drinking too much alcohol.  Poor nutrition.  Obesity.  Cushing syndrome.  Diabetes.  Hyperlipidemia.  Pregnancy.  Certain drugs.  Poisons.  Some viral infections.  What increases the risk? You may be more likely to develop fatty liver if you:  Abuse alcohol.  Are pregnant.  Are overweight.  Have diabetes.  Have hepatitis.  Have a high triglyceride level.  What are the signs or symptoms? Fatty liver often does not cause any symptoms. In cases where symptoms develop, they can include:  Fatigue.  Weakness.  Weight loss.  Confusion.  Abdominal pain.  Yellowing of your skin and the white parts of your eyes (jaundice).  Nausea and vomiting.  How is this diagnosed? Fatty liver may be diagnosed by:  Physical exam and medical history.  Blood tests.  Imaging tests, such as an ultrasound, CT scan, or MRI.  Liver biopsy. A small sample of liver tissue is removed using a needle. The sample is then looked at under a microscope.  How is this treated? Fatty liver is often caused by other health conditions. Treatment for fatty liver may involve medicines and lifestyle changes to manage conditions such as:  Alcoholism.  High cholesterol.  Diabetes.  Being overweight or  obese.  Follow these instructions at home:  Eat a healthy diet as directed by your health care provider.  Exercise regularly. This can help you lose weight and control your cholesterol and diabetes. Talk to your health care provider about an exercise plan and which activities are best for you.  Do not drink alcohol.  Take medicines only as directed by your health care provider. Contact a health care provider if: You have difficulty controlling your:  Blood sugar.  Cholesterol.  Alcohol consumption.  Get help right away if:  You have abdominal pain.  You have jaundice.  You have nausea and vomiting. This information is not intended to replace advice given to you by your health care provider. Make sure you discuss any questions you have with your health care provider. Document Released: 05/31/2005 Document Revised: 09/21/2015 Document Reviewed: 08/25/2013 Elsevier Interactive Patient Education  [employer].

## 2016-10-15 NOTE — Progress Notes (Signed)
Subjective:    Patient ID: [name] [name], female    DOB: 18-Jun-1960, 56 y.o.   MRN: [id number]  HPI Here today for f/u.  She was last seen in December for dysphagia.   Hx of H pylori x 2 in the past. (Treated x 2 by Dr. Britta Mccreedy). She says the dysphagia comes and goes. She says sometimes has epigastric pain. She says sometimes she has frothy white mucous if she leans toward the left. No RUQ pain. She sometimes has pain after she eats. If she eat Hummus or tomatoes, or salsa there is no pain. If she eats anything heavy she will have pain such as beef.     DG Esophagus which revealed IMPRESSION: Normal exam.  EGD 06/07/2014: Normal EGD  09/27/2016 [place] Complete ( Rt upper quadrant pain). Two tiny gallbladder polyps. No gallstones or GB sludge. Fatty infiltration and/or hepatocellular disease.   Review of Systems Past Medical History:  Diagnosis Date  . Aneurysm ([place])    brain  . Chest pain    denied on 06/18/2012  . Chronic kidney disease    L - upper pole- defect, doesn't effect     . Family history of anesthesia complication    brother & neice "flat lined" during induction: neice d/t a medication given for a blood clotting problem; brother d/t "miscalculated amount of anesthesia"  . Fibromyalgia   . GERD (gastroesophageal reflux disease)    h/o colitis   . H. pylori infection    2012- stomach biopsy  . H/O hiatal hernia   . Headache(784.0)    using tylenol prn  . Hyperlipidemia   . Hypertension   . Hypoglycemia   . Normal cardiac stress test    pt. released fr. Dr. [name] care in 10/2011, all findings w/i normal limits  . Overweight(278.02)    Obesity  . Palpitations   . Sinusitis, acute    seen by PCP- 06/17/2012, will treat /w augmentin & tussin/DM  . Uterine fibroid    pt. states that she has a "closed cervix"     Past Surgical History:  Procedure Laterality Date  . COLONOSCOPY    . CRANIOTOMY Right 06/24/2012   Procedure: CRANIOTOMY INTRACRANIAL ANEURYSM FOR  CAROTID;  Surgeon: [name], MD;  Location: [place] [facility];  Service: Neurosurgery;  Laterality: Right;  RIGHT Pterional Craniotomy for aneurysm  . LIPOMA EXCISION     buttock 10/2010    Allergies  Allergen Reactions  . Azithromycin Anaphylaxis    Breathing difficulty, swelling  . Ace Inhibitors Swelling  . Ciprofloxacin Nausea And Vomiting    Also: kidney failure   . Other Swelling, Other (See Comments) and Cough    Sneezing allergy to cats/grass/dust  . Medrol [Methylprednisolone] Palpitations    Heart racing, increased BP  . Tetracyclines & Related Palpitations    Heart racing, increased bp    Current Outpatient Prescriptions on File Prior to Visit  Medication Sig Dispense Refill  . acetaminophen (TYLENOL) 650 MG CR tablet Take 1,300 mg by mouth every 8 (eight) hours as needed for pain or fever.     [name] aspirin 81 MG tablet Take 1 tablet (81 mg total) by mouth daily. 30 tablet 0  . Cholecalciferol (D 1000) 1000 UNITS tablet Take 1,000 Units by mouth every evening.     . diltiazem (CARDIZEM LA) 240 MG 24 hr tablet Take 180 mg in the morning and 240 mg in the evening (Patient taking differently: Take 180-240 mg by mouth 2 (  two) times daily. Take 180 mg in the morning and 240 mg in the evening) 180 tablet 3  . loratadine (CLARITIN) 10 MG tablet Take 10 mg by mouth daily.     . metoprolol (LOPRESSOR) 50 MG tablet Take 50 mg by mouth 2 (two) times daily.    . pantoprazole (PROTONIX) 40 MG tablet Take 40 mg by mouth daily.  1  . PROAIR HFA 108 (90 Base) MCG/ACT inhaler INHALE 2 PUFF EVERY 4 HOURS AS NEEDED  1   No current facility-administered medications on file prior to visit.         Objective:   Physical Exam Blood pressure 140/82, pulse 72, temperature 98.4 F (36.9 C), height 5\' 2"  (1.575 m), weight 200 lb 12.8 oz (91.1 kg), last menstrual period 08/25/2012.  Alert and oriented. Skin warm and dry. Oral mucosa is moist.   . Sclera anicteric, conjunctivae is pink. Thyroid  not enlarged. No cervical lymphadenopathy. Lungs clear. Heart regular rate and rhythm.  Abdomen is soft. Bowel sounds are positive. No hepatomegaly. No abdominal masses felt. No tenderness.  No edema to lower extremities.          Assessment & Plan:  Epigastric pain which comes and goes.  EGD in 2016 was normal. She will continue the Protonix.  Fatty liver: Hepatic function and [place] Electrography.  She will bring HIDA scan report OV in 6 months.

## 2016-10-16 LAB — HEPATIC FUNCTION PANEL
ALK PHOS: 71 U/L (ref 33–130)
ALT: 9 U/L (ref 6–29)
AST: 14 U/L (ref 10–35)
Albumin: 4.2 g/dL (ref 3.6–5.1)
Bilirubin, Direct: 0.1 mg/dL (ref <=0.2)
Indirect Bilirubin: 0.4 mg/dL (ref 0.2–1.2)
Total Bilirubin: 0.5 mg/dL (ref 0.2–1.2)
Total Protein: 7.1 g/dL (ref 6.1–8.1)

## 2016-10-21 ENCOUNTER — Telehealth (INDEPENDENT_AMBULATORY_CARE_PROVIDER_SITE_OTHER): Payer: Self-pay | Admitting: Internal Medicine

## 2016-10-21 NOTE — Telephone Encounter (Signed)
Patient called, she would like her results.  [phone]

## 2016-10-23 DIAGNOSIS — Z79899 Other long term (current) drug therapy: Secondary | ICD-10-CM | POA: Diagnosis not present

## 2016-10-23 DIAGNOSIS — I160 Hypertensive urgency: Secondary | ICD-10-CM | POA: Diagnosis not present

## 2016-10-23 DIAGNOSIS — R51 Headache: Secondary | ICD-10-CM | POA: Diagnosis not present

## 2016-10-23 DIAGNOSIS — R0789 Other chest pain: Secondary | ICD-10-CM | POA: Diagnosis not present

## 2016-10-23 DIAGNOSIS — R079 Chest pain, unspecified: Secondary | ICD-10-CM | POA: Diagnosis not present

## 2016-10-23 DIAGNOSIS — Z8249 Family history of ischemic heart disease and other diseases of the circulatory system: Secondary | ICD-10-CM | POA: Diagnosis not present

## 2016-10-23 DIAGNOSIS — Z7982 Long term (current) use of aspirin: Secondary | ICD-10-CM | POA: Diagnosis not present

## 2016-10-23 DIAGNOSIS — I10 Essential (primary) hypertension: Secondary | ICD-10-CM | POA: Diagnosis not present

## 2016-10-23 DIAGNOSIS — K219 Gastro-esophageal reflux disease without esophagitis: Secondary | ICD-10-CM | POA: Diagnosis not present

## 2016-10-23 DIAGNOSIS — Z87891 Personal history of nicotine dependence: Secondary | ICD-10-CM | POA: Diagnosis not present

## 2016-10-23 NOTE — Telephone Encounter (Signed)
Results have been given to patient 

## 2016-10-24 ENCOUNTER — Ambulatory Visit (HOSPITAL_COMMUNITY): Payer: BLUE CROSS/BLUE SHIELD

## 2016-10-31 ENCOUNTER — Ambulatory Visit (HOSPITAL_COMMUNITY)
Admission: RE | Admit: 2016-10-31 | Discharge: 2016-10-31 | Disposition: A | Discharge status: Home / Self Care | Payer: BLUE CROSS/BLUE SHIELD | Source: Ambulatory Visit | Attending: Internal Medicine | Admitting: Internal Medicine

## 2016-10-31 DIAGNOSIS — K760 Fatty (change of) liver, not elsewhere classified: Secondary | ICD-10-CM

## 2016-10-31 DIAGNOSIS — K824 Cholesterolosis of gallbladder: Secondary | ICD-10-CM | POA: Diagnosis not present

## 2016-11-01 ENCOUNTER — Encounter (INDEPENDENT_AMBULATORY_CARE_PROVIDER_SITE_OTHER): Payer: Self-pay

## 2016-11-01 ENCOUNTER — Encounter (INDEPENDENT_AMBULATORY_CARE_PROVIDER_SITE_OTHER): Payer: Self-pay | Admitting: Internal Medicine

## 2016-11-01 NOTE — Progress Notes (Signed)
Patient was given an appointment for 11/03/17 at 8:30am and the December appointment was cancelled.  Patient was also added to the recall list to receive a reminder letter.  A letter was mailed to the patient.

## 2016-11-06 DIAGNOSIS — Z6835 Body mass index (BMI) 35.0-35.9, adult: Secondary | ICD-10-CM | POA: Diagnosis not present

## 2016-11-06 DIAGNOSIS — I10 Essential (primary) hypertension: Secondary | ICD-10-CM | POA: Diagnosis not present

## 2016-11-06 DIAGNOSIS — D271 Benign neoplasm of left ovary: Secondary | ICD-10-CM | POA: Diagnosis not present

## 2016-11-11 DIAGNOSIS — N83202 Unspecified ovarian cyst, left side: Secondary | ICD-10-CM | POA: Diagnosis not present

## 2016-11-11 DIAGNOSIS — D259 Leiomyoma of uterus, unspecified: Secondary | ICD-10-CM | POA: Diagnosis not present

## 2016-11-11 DIAGNOSIS — D271 Benign neoplasm of left ovary: Secondary | ICD-10-CM | POA: Diagnosis not present

## 2016-11-20 DIAGNOSIS — I671 Cerebral aneurysm, nonruptured: Secondary | ICD-10-CM | POA: Diagnosis not present

## 2016-11-28 DIAGNOSIS — I10 Essential (primary) hypertension: Secondary | ICD-10-CM | POA: Diagnosis not present

## 2016-11-28 DIAGNOSIS — I671 Cerebral aneurysm, nonruptured: Secondary | ICD-10-CM | POA: Diagnosis not present

## 2016-11-28 DIAGNOSIS — Z6836 Body mass index (BMI) 36.0-36.9, adult: Secondary | ICD-10-CM | POA: Diagnosis not present

## 2016-12-02 ENCOUNTER — Encounter: Payer: Self-pay | Admitting: Neurology

## 2016-12-02 ENCOUNTER — Ambulatory Visit (INDEPENDENT_AMBULATORY_CARE_PROVIDER_SITE_OTHER): Payer: BLUE CROSS/BLUE SHIELD | Admitting: Neurology

## 2016-12-02 VITALS — BP 130/80 | HR 55 | Ht 62.0 in | Wt 202.6 lb

## 2016-12-02 DIAGNOSIS — M5412 Radiculopathy, cervical region: Secondary | ICD-10-CM | POA: Diagnosis not present

## 2016-12-02 DIAGNOSIS — I10 Essential (primary) hypertension: Secondary | ICD-10-CM

## 2016-12-02 DIAGNOSIS — I671 Cerebral aneurysm, nonruptured: Secondary | ICD-10-CM

## 2016-12-02 DIAGNOSIS — M5481 Occipital neuralgia: Secondary | ICD-10-CM | POA: Diagnosis not present

## 2016-12-02 DIAGNOSIS — M542 Cervicalgia: Secondary | ICD-10-CM | POA: Diagnosis not present

## 2016-12-02 MED ORDER — CYCLOBENZAPRINE HCL 5 MG PO TABS: 5.0000 mg | ORAL_TABLET | Freq: Three times a day (TID) | ORAL | 1 refills | Status: DC | PRN

## 2016-12-02 NOTE — Patient Instructions (Addendum)
-   flexeril as needed for muscle tension. - relaxation and cope with stress can help the neuralgia and HA. - can also take tylenol and ASA as needed for tension HA and neck pain.   - consider PT/OT if needed.  - check BP at home and record. - follow up in 6 months.

## 2016-12-02 NOTE — Progress Notes (Signed)
NEUROLOGY CLINIC FOLLOW UP NOTE  NAME: [name] [name] DOB: [birth year]  HPI: [name] [name] is a 56 y.o. female with PMH of HTN, HLD, right ICA terminus aneurysm s/p clipping in 2013 and headache who presents as a new patient for Headache.   Patient was following with Dr.penumalli in 2013 for headache. At that time, she complained of intermittent headache, right or left-sided, associated with neck pain, left shoulder pain, nausea, blurred vision, and dizziness. Headaches were proceeded by feeding of sore spots over the scalp. She took Tylenol for headache. However she had MRI and MRA at that time showed 45 mm terminal right ICA aneurysm. MRI C-spine showed minimal degenerative changes without significant spinal stenosis. She underwent aneurysm clipping by Dr. Cyndy Freeze and after that her headache was gone for several years.  However, since 6 months ago, the headache gradually coming back, the headache was very similar to previous headache, starting with lower neck pain, bilateral shoulder pain, up to the back of her head, either left or right-sided with neck tightness. Headache more pressure, dull feeling, sometimes lasting 2-3 days. Tylenol works if takes early. For the last 2 weeks, headache seems getting worse. At that same time he had right hand cramping after overexerting the right hand in a party, as well as sometimes left arm tingling. Went to see PCP, had a stress test which was negative and also had MRI/MRA showed previous aneurysm clipping without change, however, also showed symmetric mild signal along bilateral corticospinal tracts, progressed from 2015. It reported this finding is nonspecific however may be seen associated with ALS. Due to this abnormal findings, patient was referred here for further evaluation.  She has history of hypertension, on Cardizem, hydralazine, and metoprolol. Recently her blood pressure was on the lower side and her PCP is trying to taper off hydralazine.  Today blood pressure 99/60. Questionable history of fibromyalgia. He is also on aspirin daily. Denies any alcohol smoking or using illicit drugs.  03/25/16 follow up - she has had some improvement with PT/OT. However, she still has intermittent mild HA at back of head and neck pain and tight. She sometimes has tenderness along the occipital nerve distribution. She did not start amitriptyline as she had confusion with dosing. She uses lidocaine cream PRN. She admitted that in the morning seems less symptoms and as the day goes by, she may feel more symptoms. She told me that she also has fibromyalgia. BP today 115/75.   07/02/16 follow up - pt has been doing well. HA intermittent but improved. Currently no HA. However, on 06/19/16, she was at work, suddenly she had one sharp pain at right side of face, lasted several min, and she also started to have right arm and right chest pain, BP up, she got up and walking out to hallway, about to pass out, her co-worker called EMS and she was sent to ED. At ED, her right facial pain gone, but started to have right dull HA, and still has some mild right arm and chest pain, EKG negative. She had MRI again demonstrated b/l corticospinal tract hyper T2 signal, unchanged. MRA neck negative but MRA head showed left supraclinoid ICA 61mm, concerning for small aneurysm. She followed with NSG Dr. Christella Noa and no intervention needed and will consider CTA head if needed. However, when comparing with her CTA head in 2014, it was unchanged. Pt came in today want to seek my opinion on that.   Interval history: During the interval time, pt has been doing  the same. Still has headache, 3-5 times a month, lasting 2 days average. Headache more at top and back of the head and behind eyes, right more than left. Most of time headache most severe, but sometimes can go up to 7/10. Not able to tolerating Cymbalta which was discontinued. Has changed several pillows for neck pain. Follow with Dr.  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 She was discharged from neurosurgery service. BP today 130/80.  Past Medical History:  Diagnosis Date  . Aneurysm ([place])    brain  . Chest pain    denied on 06/18/2012  . Chronic kidney disease    L - upper pole- defect, doesn't effect     . Family history of anesthesia complication    brother & neice "flat lined" during induction: neice d/t a medication given for a blood clotting problem; brother d/t "miscalculated amount of anesthesia"  . Fibromyalgia   . GERD (gastroesophageal reflux disease)    h/o colitis   . H. pylori infection    2012- stomach biopsy  . H/O hiatal hernia   . Headache(784.0)    using tylenol prn  . Hyperlipidemia   . Hypertension   . Hypoglycemia   . Normal cardiac stress test    pt. released fr. Dr. [name] care in 10/2011, all findings w/i normal limits  . Overweight(278.02)    Obesity  . Palpitations   . Sinusitis, acute    seen by PCP- 06/17/2012, will treat /w augmentin & tussin/DM  . Uterine fibroid    pt. states that she has a "closed cervix"    Past Surgical History:  Procedure Laterality Date  . COLONOSCOPY    . CRANIOTOMY Right 06/24/2012   Procedure: CRANIOTOMY INTRACRANIAL ANEURYSM FOR CAROTID;  Surgeon: [name], MD;  Location: [facility];  Service: Neurosurgery;  Laterality: Right;  RIGHT Pterional Craniotomy for aneurysm  . LIPOMA EXCISION     buttock 10/2010   Family History  Problem Relation Age of Onset  . Hypertension Other   . Coronary artery disease Other   . Dementia Mother   . Dementia Maternal Aunt    Current Outpatient Prescriptions  Medication Sig Dispense Refill  . acetaminophen (TYLENOL) 650 MG CR tablet Take 1,300 mg by mouth every 8 (eight) hours as needed for pain or fever.     [name] aspirin 81 MG tablet Take 1 tablet (81 mg total) by mouth daily. (Patient taking differently: Take 81 mg by mouth 2  (two) times daily. ) 30 tablet 0  . Cholecalciferol (D 1000) 1000 UNITS tablet Take 1,000 Units by mouth every evening.     . diltiazem (CARDIZEM LA) 240 MG 24 hr tablet Take 180 mg in the morning and 240 mg in the evening (Patient taking differently: Take 180-240 mg by mouth 2 (two) times daily. Take 180 mg in the morning and 240 mg in the evening) 180 tablet 3  . Liniments (SALONPAS EX) Apply topically as needed.    . loratadine (CLARITIN) 10 MG tablet Take 10 mg by mouth daily.     . metoprolol (LOPRESSOR) 50 MG tablet Take 50 mg by mouth 2 (two) times daily.    . pantoprazole (PROTONIX) 40 MG tablet Take 40 mg by mouth daily.  1  . PROAIR HFA 108 (90 Base) MCG/ACT inhaler INHALE 2 PUFF EVERY 4 HOURS AS NEEDED  1  . cyclobenzaprine (FLEXERIL) 5 MG tablet Take  1 tablet (5 mg total) by mouth every 8 (eight) hours as needed for muscle spasms. 15 tablet 1   No current facility-administered medications for this visit.    Allergies  Allergen Reactions  . Azithromycin Anaphylaxis    Breathing difficulty, swelling  . Ace Inhibitors Swelling  . Ciprofloxacin Nausea And Vomiting    Also: kidney failure   . Other Swelling, Other (See Comments) and Cough    Sneezing allergy to cats/grass/dust  . Medrol [Methylprednisolone] Palpitations    Heart racing, increased BP  . Tetracyclines & Related Palpitations    Heart racing, increased bp   Social History   Social History  . Marital status: Single    Spouse name: N/A  . Number of children: N/A  . Years of education: N/A   Occupational History  . Full time [employer] History Main Topics  . Smoking status: Former Smoker    Packs/day: 0.80    Years: 15.00    Types: Cigarettes    Start date: 02/13/1989    Quit date: 04/28/2004  . Smokeless tobacco: Never Used  . Alcohol use No  . Drug use: No  . Sexual activity: No   Other Topics Concern  . Not on file   Social History Narrative   Single    Review of  Systems Full 14 system review of systems performed and notable only for those listed, all others are neg:  Constitutional:   Cardiovascular:  Ear/Nose/Throat:  Ringing in ears  Skin:  Eyes:  Eye pain Respiratory:  SOB, cough  Gastroitestinal:   Genitourinary:  Hematology/Lymphatic:   Endocrine:  Musculoskeletal:  Joint pain, neck pain Allergy/Immunology:  Env allergies Neurological:  Headache, dizziness, numbness  Psychiatric:  Sleep: Snoring   Physical Exam  Vitals:   12/02/16 1007  BP: 130/80  Pulse: (!) 55    General - Well nourished, well developed, in no apparent distress.  Ophthalmologic - Sharp disc margins OU.  Cardiovascular - Regular rate and rhythm with no murmur. Carotid pulses were 2+ without bruits.   Neck - supple, no nuchal rigidity. Mild bilateral occipital neuralgia, R>L.  Mental Status -  Level of arousal and orientation to time, place, and person were intact. Language including expression, naming, repetition, comprehension, reading, and writing was assessed and found intact. Attention span and concentration were normal. Recent and remote memory were intact. Fund of Knowledge was assessed and was intact.  Cranial Nerves II - XII - II - Visual field intact OU. III, IV, VI - Extraocular movements intact. V - Facial sensation intact bilaterally. VII - Facial movement intact bilaterally. VIII - Hearing & vestibular intact bilaterally. X - Palate elevates symmetrically. XI - Chin turning & shoulder shrug intact bilaterally. XII - Tongue protrusion intact.  Motor Strength - The patient's strength was normal in all extremities and pronator drift was absent.  Bulk was normal and fasciculations were absent.   Motor Tone - Muscle tone was assessed at the neck and appendages and was normal.  Reflexes - The patient's reflexes were normal in all extremities and she had no pathological reflexes.  Sensory - Light touch, temperature/pinprick, vibration and  proprioception, and Romberg testing were assessed and were normal.    Coordination - The patient had normal movements in the hands and feet with no ataxia or dysmetria.  Tremor was absent.  Gait and Station - The patient's transfers, posture, gait, station, and turns were observed as normal.   Imaging  I have personally  reviewed the radiological images below and agree with the radiology interpretations.  MRI with and without contrast 12/14/2015   no acute intracranial process. Symmetric mild abnormal signal along bilateral cortical spinal tracts, progress from 2015. Nonspecific, though associate with ALS. S/p right carotid terminus aneurysm clipping.  CT head 12/14/2015 Previous aneurysm clipping placement the right MCA region. No intracranial mass, hemorrhage, or extra axial fluid collection. The gray-white compartments appears normal. No acute infarct evident. There is a mild paranasal sinus disease.  CT cervical spine 12/14/2015 No fracture or spondylolisthesis  MRA 01/09/2012 There is a 45 mm terminal right ICA aneurysm. No signal change from MRA head on 06/06/2011.  MRI cervical spine 02/06/2012 Minimal degenerative changes without significant spinal stenosis, cord compression, foraminal narrowing or nerve root compression. The right carotid terminal aneurysm is partially visualized on the present exam. There is not adequately accessed by the present technique. No obvious hemorrhage is noted.  MRI brain with and without contrast 06/19/16 1. No acute intracranial process 2. Symmetric mildly abnormal signal involving the bilateral cortical spinal tracts, similar to previous, again, finding is nonspecific, but can be seen in the setting of ALS  MRA head 06/19/16 1. Status post right carotid terminus aneurysm clipping. No residue aneurysm identified 2. 3 mm focal outpouching arising from the supraclinoid left ICA, which may reflect a small aneurysm or vascular infundibulum associated  with a hypoplastic left PCOM. This would better imaged via CTA.  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 Patient circle of Willis. No large vessel occlusion, aneurysm, or significant stenosis is identified   Lab Review None    Assessment and Plan:   In summary, [name] [name] is a 56 y.o. female with PMH of HTN, HLD, right ICA terminus aneurysm s/p clipping in 2013 and headache who presents for Headache. She had a similar headache in 2013, found to have right terminal ICA aneurysm s/p clipping. Headache was gone for several years but now come back. MRI no acute process except stable right terminal ICA clipping. However found to have symmetric mild signal along bilateral corticospinal tracts, reportedly may associated with ALS. However, on my review of images, the signals are the same as those in her MRI in 2015, no progression. Pt had another MRI on 06/19/16, which showed same finding and no progression. With clinical correlations, this is not ALS but rather normal signal. I have reassured patient. If pt has any sign of ALS, will do EMG. Pt agreed.   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 Finished PT/OT for cervicogenic pain. She also has fibromyalgia, put on cymbalta which was later discontinued due to intolerance. Recommend relaxation and de-stress. Overtime, no significant change, will prescribe flexeril PRN  For her 69mm left supraclinoid ICA possible aneurysm on 05/31/16 MRA, her CTA in 2014 was reviewed and it was showing similar structure and no progression. Followed with Dr. 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   Plan: - flexeril as needed for muscle tension. - relaxation and cope with stress can  help the neuralgia and HA. - can also take tylenol and ASA as needed for tension HA and neck pain.   - consider PT/OT if needed.  - check BP at home and record.  -  will consider EMG/NCS if any sign of ALS.   - follow up in 6 months    I spent more than 25 minutes of face to face time with the patient. Greater than 50% of time was spent in counseling and coordination of care. We discussed about HA, neck pain management. I'll milligrams.   No orders of the defined types were placed in this encounter.   Meds ordered this encounter  Medications  . DISCONTD: diltiazem (CARDIZEM CD) 180 MG 24 hr capsule    Sig: TAKE ONE CAPSULE BY MOUTH TWICE A DAY FOR HIGH BLOOD PRESSURE    Refill:  1  . DISCONTD: chlorthalidone (HYGROTON) 25 MG tablet    Sig: Take 25 mg by mouth daily.    Refill:  1  . Liniments (SALONPAS EX)    Sig: Apply topically as needed.  . cyclobenzaprine (FLEXERIL) 5 MG tablet    Sig: Take 1 tablet (5 mg total) by mouth every 8 (eight) hours as needed for muscle spasms.    Dispense:  15 tablet    Refill:  1    Patient Instructions  - flexeril as needed for muscle tension. - relaxation and cope with stress can help the neuralgia and HA. - can also take tylenol and ASA as needed for tension HA and neck pain.   - consider PT/OT if needed.  - check BP at home and record. - follow up in 6 months.     Rosalin Hawking, MD PhD [facility] [address] [place], [place] [address] [phone]

## 2016-12-05 ENCOUNTER — Ambulatory Visit (INDEPENDENT_AMBULATORY_CARE_PROVIDER_SITE_OTHER): Payer: BLUE CROSS/BLUE SHIELD | Admitting: Adult Health

## 2016-12-05 ENCOUNTER — Encounter: Payer: Self-pay | Admitting: Adult Health

## 2016-12-05 VITALS — BP 120/68 | HR 57 | Ht 63.0 in | Wt 205.0 lb

## 2016-12-05 DIAGNOSIS — R072 Precordial pain: Secondary | ICD-10-CM | POA: Diagnosis not present

## 2016-12-05 DIAGNOSIS — I10 Essential (primary) hypertension: Secondary | ICD-10-CM

## 2016-12-05 DIAGNOSIS — R001 Bradycardia, unspecified: Secondary | ICD-10-CM | POA: Diagnosis not present

## 2016-12-05 DIAGNOSIS — R42 Dizziness and giddiness: Secondary | ICD-10-CM | POA: Diagnosis not present

## 2016-12-05 MED ORDER — METOPROLOL TARTRATE 50 MG PO TABS: 50.0000 mg | ORAL_TABLET | ORAL | 6 refills | Status: DC

## 2016-12-05 NOTE — Progress Notes (Signed)
Cardiology Office Note   Date:  12/05/2016   ID:  [name] [name], DOB 1960-08-06, MRN [id number]  PCP:  [name], MD  Cardiologist:  [facility]  Chief Complaint  Patient presents with  . Chest Pain  . Hypertension      History of Present Illness: [name] [name] is a 56 y.o. female who presents for ongoing assessment and management of chronic chest pain, HTN, hyperlipidemia, right ICA terminus aneurysm s/p clipping in 2013, with history of multiple medical problems. She was seen last by Dr. Harl Bowie on 02/16/2016.   She is being seen today post ER visit on 10/23/2016 after being seen at [facility] in [place]. She described it as a sharp chest pain on the left side, reproducible with palpation, but patient states that it become more intense and lasting longer, worse with movement. She also complained of facial numbness and headache with difficulty speaking. She was ruled out for CVA and ACS.  She comes today with continued substernal chest pressure with radiation behind to her back. She continues fatigue. She states that her HR goes low at night and wakes her up with dizziness. She checks it and states it is in the 40's.     Past Medical History:  Diagnosis Date  . Aneurysm ([place])    brain  . Chest pain    denied on 06/18/2012  . Chronic kidney disease    L - upper pole- defect, doesn't effect     . Family history of anesthesia complication    brother & neice "flat lined" during induction: neice d/t a medication given for a blood clotting problem; brother d/t "miscalculated amount of anesthesia"  . Fibromyalgia   . GERD (gastroesophageal reflux disease)    h/o colitis   . H. pylori infection    2012- stomach biopsy  . H/O hiatal hernia   . Headache(784.0)    using tylenol prn  . Hyperlipidemia   . Hypertension   . Hypoglycemia   . Normal cardiac stress test    pt. released fr. Dr. [name] care in 10/2011, all findings w/i normal  limits  . Overweight(278.02)    Obesity  . Palpitations   . Sinusitis, acute    seen by PCP- 06/17/2012, will treat /w augmentin & tussin/DM  . Uterine fibroid    pt. states that she has a "closed cervix"     Past Surgical History:  Procedure Laterality Date  . COLONOSCOPY    . CRANIOTOMY Right 06/24/2012   Procedure: CRANIOTOMY INTRACRANIAL ANEURYSM FOR CAROTID;  Surgeon: [name], MD;  Location: [facility];  Service: Neurosurgery;  Laterality: Right;  RIGHT Pterional Craniotomy for aneurysm  . LIPOMA EXCISION     buttock 10/2010     Current Outpatient Prescriptions  Medication Sig Dispense Refill  . acetaminophen (TYLENOL) 650 MG CR tablet Take 1,300 mg by mouth every 8 (eight) hours as needed for pain or fever.     [name] aspirin EC 81 MG tablet Take 81 mg by mouth 2 (two) times daily.    . Cholecalciferol (D 1000) 1000 UNITS tablet Take 1,000 Units by mouth every evening.     . cyclobenzaprine (FLEXERIL) 5 MG tablet Take 1 tablet (5 mg total) by mouth every 8 (eight) hours as needed for muscle spasms. 15 tablet 1  . diltiazem (CARDIZEM LA) 240 MG 24 hr tablet Take 180 mg in the morning and 240 mg in the evening (Patient taking differently: Take  180-240 mg by mouth 2 (two) times daily. Take 180 mg in the morning and 240 mg in the evening) 180 tablet 3  . Liniments (SALONPAS EX) Apply topically as needed.    . loratadine (CLARITIN) 10 MG tablet Take 10 mg by mouth daily.     . metoprolol tartrate (LOPRESSOR) 50 MG tablet Take 1 tablet (50 mg total) by mouth every morning. & 25 mg in the evening 45 tablet 6  . pantoprazole (PROTONIX) 40 MG tablet Take 40 mg by mouth daily.  1  . PROAIR HFA 108 (90 Base) MCG/ACT inhaler INHALE 2 PUFF EVERY 4 HOURS AS NEEDED  1   No current facility-administered medications for this visit.     Allergies:   Azithromycin; Ace inhibitors; Ciprofloxacin; Other; Medrol [methylprednisolone]; and Tetracyclines & related    Social History:  The patient   reports that she quit smoking about 12 years ago. Her smoking use included Cigarettes. She started smoking about 27 years ago. She has a 12.00 pack-year smoking history. She has never used smokeless tobacco. She reports that she does not drink alcohol or use drugs.   Family History:  The patient's family history includes Coronary artery disease in her other; Dementia in her maternal aunt and mother; Hypertension in her other.    ROS: All other systems are reviewed and negative. Unless otherwise mentioned in H&P    PHYSICAL EXAM: VS:  BP 120/68 (BP Location: Right Arm)   Pulse (!) 57   Ht 5\' 3"  (1.6 m)   Wt 205 lb (93 kg)   LMP 08/25/2012   SpO2 95%   BMI 36.31 kg/m  , BMI Body mass index is 36.31 kg/m. GEN: Well nourished, well developed, in no acute distress  HEENT: normal  Neck: no JVD, carotid bruits, or masses Cardiac: RRR;braycardic, no murmurs, rubs, or gallops,no edema  Respiratory:  clear to auscultation bilaterally, normal work of breathing GI: soft, nontender, nondistended, + BS MS: no deformity or atrophy  Skin: warm and dry, no rash Neuro:  Strength and sensation are intact Psych: euthymic mood, full affect  EKG: (from ER at [facility]) NSR reate of 59 bpm.   Recent Labs: 12/19/2015: BUN 14; Creatinine, Ser 1.32; Potassium 4.4; Sodium 142 10/15/2016: ALT 9    Lipid Panel    Component Value Date/Time   CHOL 193 12/19/2015 1142   TRIG 97 12/19/2015 1142   HDL 54 12/19/2015 1142   CHOLHDL 3.6 12/19/2015 1142   CHOLHDL 3.3 Apr 09, 2014 0518   VLDL 10 09-Apr-2014 0518   LDLCALC 120 (H) 12/19/2015 1142      Wt Readings from Last 3 Encounters:  12/05/16 205 lb (93 kg)  12/02/16 202 lb 9.6 oz (91.9 kg)  10/15/16 200 lb 12.8 oz (91.1 kg)      Other studies Reviewed: Echocardiogram 04-09-2017  Left ventricle: The cavity size was normal. Systolic function was normal. Wall motion was normal; there were no regional wall motion abnormalities.  ASSESSMENT AND  PLAN:  1. Chronic chest pain: Has been ruled out for ACS, felt to be related to musculoskeletal and hypertensive etiology. She continues to complain of substernal pressure, but is able to swallow food. She states that it does radiate to her back. She feels tired a lot. Stress test results on 08/17/2015 were negative for ischemia. I have offered reassurance. May need to consider a cardiac cath for definitive evaluation for CAD if symptoms persist, but the pain is atypical  Will continue medical management for now.  2. Hypertension: BP is labile. Chest pain was improved when treated in the ER with amlodipine. She is advised to take her BP medications as directed to maintain BP control.   3. Bradycardia: Will decrease metoprolol to 50 mg in the am and 25 mg in the pm. May consider decreasing to 25 mg BID if necessary.  4. Chronic pain: Has fibromyalgia diagnosis. Followed by PCP.     Current medicines are reviewed at length with the patient today.    Labs/ tests ordered today include:  None  Phill Myron. West Pugh, ANP, AACC   12/05/2016 5:31 PM    [facility] [address] [place], [place] [address] Phone: [phone]; Fax: [phone]

## 2016-12-05 NOTE — Patient Instructions (Signed)
Medication Instructions:   Your physician has recommended you make the following change in your medication:   Decrease metoprolol to 50 mg in the morning and 25 mg in the evening.  Do not take chlorthalidone.  Continue all other medications the same.  Labwork:  NONE  Testing/Procedures: Your physician has recommended that you wear a holter monitor for 48 hours. Holter monitors are medical devices that record the heart's electrical activity. Doctors most often use these monitors to diagnose arrhythmias. Arrhythmias are problems with the speed or rhythm of the heartbeat. The monitor is a small, portable device. You can wear one while you do your normal daily activities. This is usually used to diagnose what is causing palpitations/syncope (passing out).  Follow-Up:  Your physician recommends that you schedule a follow-up appointment as scheduled with Dr. Harl Bowie.  Any Other Special Instructions Will Be Listed Below (If Applicable).  If you need a refill on your cardiac medications before your next appointment, please call your pharmacy.

## 2016-12-11 ENCOUNTER — Ambulatory Visit (INDEPENDENT_AMBULATORY_CARE_PROVIDER_SITE_OTHER): Payer: BLUE CROSS/BLUE SHIELD

## 2016-12-11 ENCOUNTER — Ambulatory Visit: Payer: BLUE CROSS/BLUE SHIELD

## 2016-12-11 DIAGNOSIS — R42 Dizziness and giddiness: Secondary | ICD-10-CM | POA: Diagnosis not present

## 2016-12-11 DIAGNOSIS — R001 Bradycardia, unspecified: Secondary | ICD-10-CM | POA: Diagnosis not present

## 2016-12-17 DIAGNOSIS — G43909 Migraine, unspecified, not intractable, without status migrainosus: Secondary | ICD-10-CM | POA: Diagnosis not present

## 2016-12-17 DIAGNOSIS — I10 Essential (primary) hypertension: Secondary | ICD-10-CM | POA: Diagnosis not present

## 2016-12-17 DIAGNOSIS — I671 Cerebral aneurysm, nonruptured: Secondary | ICD-10-CM | POA: Diagnosis not present

## 2016-12-17 DIAGNOSIS — E782 Mixed hyperlipidemia: Secondary | ICD-10-CM | POA: Diagnosis not present

## 2016-12-19 ENCOUNTER — Encounter (HOSPITAL_COMMUNITY): Payer: Self-pay

## 2016-12-19 ENCOUNTER — Emergency Department (HOSPITAL_COMMUNITY)
Admission: EM | Admit: 2016-12-19 | Discharge: 2016-12-19 | Disposition: A | Discharge status: Home / Self Care | Payer: BLUE CROSS/BLUE SHIELD | Attending: Emergency Medicine | Admitting: Emergency Medicine

## 2016-12-19 ENCOUNTER — Emergency Department (HOSPITAL_COMMUNITY): Payer: BLUE CROSS/BLUE SHIELD

## 2016-12-19 DIAGNOSIS — R0789 Other chest pain: Secondary | ICD-10-CM

## 2016-12-19 DIAGNOSIS — Z79899 Other long term (current) drug therapy: Secondary | ICD-10-CM | POA: Diagnosis not present

## 2016-12-19 DIAGNOSIS — Z87891 Personal history of nicotine dependence: Secondary | ICD-10-CM | POA: Insufficient documentation

## 2016-12-19 DIAGNOSIS — M940 Chondrocostal junction syndrome [Tietze]: Secondary | ICD-10-CM | POA: Diagnosis not present

## 2016-12-19 DIAGNOSIS — R079 Chest pain, unspecified: Secondary | ICD-10-CM | POA: Diagnosis not present

## 2016-12-19 DIAGNOSIS — Z7982 Long term (current) use of aspirin: Secondary | ICD-10-CM | POA: Insufficient documentation

## 2016-12-19 DIAGNOSIS — N189 Chronic kidney disease, unspecified: Secondary | ICD-10-CM | POA: Insufficient documentation

## 2016-12-19 DIAGNOSIS — I129 Hypertensive chronic kidney disease with stage 1 through stage 4 chronic kidney disease, or unspecified chronic kidney disease: Secondary | ICD-10-CM | POA: Insufficient documentation

## 2016-12-19 DIAGNOSIS — R110 Nausea: Secondary | ICD-10-CM | POA: Diagnosis not present

## 2016-12-19 LAB — COMPREHENSIVE METABOLIC PANEL
ALBUMIN: 3.8 g/dL (ref 3.5–5.0)
ALT: 12 U/L — ABNORMAL LOW (ref 14–54)
ANION GAP: 5 (ref 5–15)
AST: 17 U/L (ref 15–41)
Alkaline Phosphatase: 61 U/L (ref 38–126)
BILIRUBIN TOTAL: 0.4 mg/dL (ref 0.3–1.2)
BUN: 14 mg/dL (ref 6–20)
CO2: 28 mmol/L (ref 22–32)
Calcium: 9.4 mg/dL (ref 8.9–10.3)
Chloride: 108 mmol/L (ref 101–111)
Creatinine, Ser: 1.11 mg/dL — ABNORMAL HIGH (ref 0.44–1.00)
GFR calc Af Amer: >60 mL/min (ref >60)
GFR calc non Af Amer: 55 mL/min — ABNORMAL LOW (ref >60)
Glucose, Bld: 103 mg/dL — ABNORMAL HIGH (ref 65–99)
POTASSIUM: 4.2 mmol/L (ref 3.5–5.1)
Sodium: 141 mmol/L (ref 135–145)
TOTAL PROTEIN: 7.3 g/dL (ref 6.5–8.1)

## 2016-12-19 LAB — URINALYSIS, ROUTINE W REFLEX MICROSCOPIC
BACTERIA UA: NONE SEEN
BILIRUBIN URINE: NEGATIVE
GLUCOSE, UA: NEGATIVE mg/dL
Hgb urine dipstick: NEGATIVE
KETONES UR: NEGATIVE mg/dL
NITRITE: NEGATIVE
PH: 6 (ref 5.0–8.0)
PROTEIN: NEGATIVE mg/dL
Specific Gravity, Urine: 1.003 — ABNORMAL LOW (ref 1.005–1.030)

## 2016-12-19 LAB — CBC WITH DIFFERENTIAL/PLATELET
BASOS ABS: 0 10*3/uL (ref 0.0–0.1)
BASOS PCT: 0 %
Eosinophils Absolute: 0.1 10*3/uL (ref 0.0–0.7)
Eosinophils Relative: 1 %
HEMATOCRIT: 37.3 % (ref 36.0–46.0)
HEMOGLOBIN: 12.2 g/dL (ref 12.0–15.0)
Lymphocytes Relative: 25 %
Lymphs Abs: 2.3 10*3/uL (ref 0.7–4.0)
MCH: 29.1 pg (ref 26.0–34.0)
MCHC: 32.7 g/dL (ref 30.0–36.0)
MCV: 89 fL (ref 78.0–100.0)
Monocytes Absolute: 0.6 10*3/uL (ref 0.1–1.0)
Monocytes Relative: 6 %
NEUTROS ABS: 6 10*3/uL (ref 1.7–7.7)
NEUTROS PCT: 68 %
Platelets: 304 10*3/uL (ref 150–400)
RBC: 4.19 MIL/uL (ref 3.87–5.11)
RDW: 13.6 % (ref 11.5–15.5)
WBC: 9 10*3/uL (ref 4.0–10.5)

## 2016-12-19 LAB — TROPONIN I

## 2016-12-19 LAB — D-DIMER, QUANTITATIVE: D-Dimer, Quant: 0.39 ug/mL-FEU (ref 0.00–0.50)

## 2016-12-19 MED ORDER — CYCLOBENZAPRINE HCL 10 MG PO TABS
10.0000 mg | ORAL_TABLET | Freq: Once | ORAL | Status: AC
  Administered 2016-12-19: 10 mg via ORAL
  Filled 2016-12-19: qty 1

## 2016-12-19 MED ORDER — METHOCARBAMOL 500 MG PO TABS: ORAL_TABLET | ORAL | 0 refills | Status: DC

## 2016-12-19 MED ORDER — KETOROLAC TROMETHAMINE 30 MG/ML IJ SOLN
15.0000 mg | Freq: Once | INTRAMUSCULAR | Status: AC
  Administered 2016-12-19: 15 mg via INTRAVENOUS
  Filled 2016-12-19: qty 1

## 2016-12-19 NOTE — ED Provider Notes (Signed)
[facility] DEPT Provider Note   CSN: [id number] Arrival date & time: 12/19/16  0103  Time seen 1:15 AM   History   Chief Complaint Chief Complaint  Patient presents with  . Flank Pain    HPI [name] [name] is a 56 y.o. female.  HPI  patient states about 2:30 this afternoon she had some chest heaviness in her anterior chest. At 5 PM today she was typing at work and all of a sudden had a sharp shooting left-sided chest pain that radiated into her left back. She states it lasted 3-5 minutes. It made her feel nauseated and she vomited once. She also complains of a dry cough. She states she feels like something is stuck in her throat and she's been drinking a lot of water. She also relates her left arm and left leg in her left neck and felt "weird" and had been feeling heavy off and on all day. She states she is short of breath chronically but denies shortness of breath today. She denies any wheezing although she has a history of asthma. She states currently her left posterior chest hurts more than her left anterior chest. She states she woke up at midnight and again had the left-sided sharp chest pain that lasted about 15 minutes.  Patient states she had a positive d-dimer earlier this year and had a nuclear medicine scan at [facility] which was negative. She denies smoking, being on hormone replacement, or recent traveling. She denies pain or swelling in her legs. Patient states her father died of MI, but her family history is very positive for hypertension. She denies any family history of DVT or PE.  Past Medical History:  Diagnosis Date  . Aneurysm ([place])    brain  . Chest pain    denied on 06/18/2012  . Chronic kidney disease    L - upper pole- defect, doesn't effect     . Family history of anesthesia complication    brother & neice "flat lined" during induction: neice d/t a medication given for a blood clotting problem; brother d/t "miscalculated amount of anesthesia"  .  Fibromyalgia   . GERD (gastroesophageal reflux disease)    h/o colitis   . H. pylori infection    2012- stomach biopsy  . H/O hiatal hernia   . Headache(784.0)    using tylenol prn  . Hyperlipidemia   . Hypertension   . Hypoglycemia   . Normal cardiac stress test    pt. released fr. Dr. [name] care in 10/2011, all findings w/i normal limits  . Overweight(278.02)    Obesity  . Palpitations   . Sinusitis, acute    seen by PCP- 06/17/2012, will treat /w augmentin & tussin/DM  . Uterine fibroid    pt. states that she has a "closed cervix"     Patient Active Problem List   Diagnosis Date Noted  . Neck pain 12/02/2016  . Bilateral occipital neuralgia 12/02/2016  . Cervical neuralgia 03/25/2016  . Tension headache 03/25/2016  . Chest tightness or pressure 04/03/2014  . Dizziness 04/03/2014  . Headache 04/03/2014  . Chest pain 04/02/2014  . GERD (gastroesophageal reflux disease) 04/02/2014  . Meningitis, unspecified(322.9) 08/11/2012  . 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 HLD (hyperlipidemia) 01/05/2009  . OVERWEIGHT/OBESITY 01/05/2009  . Essential hypertension 01/05/2009  . Palpitations 01/05/2009  . CHEST PAIN-UNSPECIFIED 01/05/2009    Past Surgical History:  Procedure Laterality Date  . COLONOSCOPY    . CRANIOTOMY Right 06/24/2012   Procedure: CRANIOTOMY  INTRACRANIAL ANEURYSM FOR CAROTID;  Surgeon: [name], MD;  Location: [facility] OR[facility];  Service: Neurosurgery;  Laterality: Right;  RIGHT Pterional Craniotomy for aneurysm  . LIPOMA EXCISION     buttock 10/2010    OB History    No data available       Home Medications    Prior to Admission medications   Medication Sig Start Date End Date Taking? Authorizing Provider  acetaminophen (TYLENOL) 650 MG CR tablet Take 1,300 mg by mouth every 8 (eight) hours as needed for pain or fever.     [provider]  aspirin EC 81 MG tablet Take 81 mg by mouth 2 (two) times daily.    [provider]  Cholecalciferol (D 1000) 1000 UNITS tablet Take 1,000 Units by mouth every evening.     [provider]  cyclobenzaprine (FLEXERIL) 5 MG tablet Take 1 tablet (5 mg total) by mouth every 8 (eight) hours as needed for muscle spasms. 12/02/16   Rosalin Hawking, MD  diltiazem (CARDIZEM LA) 240 MG 24 hr tablet Take 180 mg in the morning and 240 mg in the evening Patient taking differently: Take 180-240 mg by mouth 2 (two) times daily. Take 180 mg in the morning and 240 mg in the evening 01/06/15   Arnoldo Lenis, MD  [facility] EX) Apply topically as needed.    [provider]  loratadine (CLARITIN) 10 MG tablet Take 10 mg by mouth daily.     [provider]  methocarbamol (ROBAXIN) 500 MG tablet Take 1 or 2 po Q 6hrs for pain 12/19/16   Rolland Porter, MD  metoprolol tartrate (LOPRESSOR) 50 MG tablet Take 1 tablet (50 mg total) by mouth every morning. & 25 mg in the evening 12/05/16   Lendon Colonel, NP  pantoprazole (PROTONIX) 40 MG tablet Take 40 mg by mouth daily. 03/04/14   [provider]  PROAIR HFA 108 (90 Base) MCG/ACT inhaler INHALE 2 PUFF EVERY 4 HOURS AS NEEDED 06/06/15   [provider]    Family History Family History  Problem Relation Age of Onset  . Hypertension Other   . Coronary artery disease Other   . Dementia Mother   . Dementia Maternal Aunt     Social History Social History  Substance Use Topics  . Smoking status: Former Smoker    Packs/day: 0.80    Years: 15.00    Types: Cigarettes    Start date: 02/13/1989    Quit date: 04/28/2004  . Smokeless tobacco: Never Used  . Alcohol use No  employed at [place]   Azithromycin; Ace inhibitors; Ciprofloxacin; Other; Medrol [methylprednisolone]; and Tetracyclines & related   Review of Systems Review of Systems  All other systems reviewed and are negative.    Physical Exam Updated Vital Signs BP 137/80 (BP Location: Left Arm)   Pulse 68   Temp  99 F (37.2 C) (Oral)   Resp 15   LMP 08/25/2012   SpO2 98%   Vital signs normal    Physical Exam  Constitutional: She is oriented to person, place, and time. She appears well-developed and well-nourished.  Non-toxic appearance. She does not appear ill. No distress.  HENT:  Head: Normocephalic and atraumatic.  Right Ear: External ear normal.  Left Ear: External ear normal.  Nose: Nose normal. No mucosal edema or rhinorrhea.  Mouth/Throat: Oropharynx is clear and moist and mucous membranes are normal. No dental abscesses or uvula swelling.  Eyes: Pupils are equal, round, and reactive to light. Conjunctivae and EOM are normal.  Neck: Normal range of motion and full passive range of motion without pain. Neck supple.  Cardiovascular: Normal rate, regular rhythm and normal heart sounds.  Exam reveals no gallop and no friction rub.   No murmur heard. Pulmonary/Chest: Effort normal and breath sounds normal. No respiratory distress. She has no wheezes. She has no rhonchi. She has no rales.     She exhibits tenderness. She exhibits no crepitus.    Very tender over costochondral junctions, right worse than left, area of shooting pain noted, also tender in her left lateral posterior chest without pain on range of motion of her arm  Abdominal: Soft. Normal appearance and bowel sounds are normal. She exhibits no distension. There is no tenderness. There is no rebound and no guarding.  Musculoskeletal: Normal range of motion. She exhibits no edema or tenderness.  Moves all extremities well.   Neurological: She is alert and oriented to person, place, and time. She has normal strength. No cranial nerve deficit.  Skin: Skin is warm, dry and intact. No rash noted. No erythema. No pallor.  Psychiatric: Her speech is normal and behavior is normal. Her mood appears anxious.  Nursing note and vitals reviewed.    ED Treatments / Results  Labs (all labs ordered are listed, but only abnormal results  are displayed) Results for orders placed or performed during the hospital encounter of 12/19/16  Urinalysis, Routine w reflex microscopic  Result Value Ref Range   Color, Urine STRAW (A) YELLOW   APPearance CLEAR CLEAR   Specific Gravity, Urine 1.003 (L) 1.005 - 1.030   pH 6.0 5.0 - 8.0   Glucose, UA NEGATIVE NEGATIVE mg/dL   Hgb urine dipstick NEGATIVE NEGATIVE   Bilirubin Urine NEGATIVE NEGATIVE   Ketones, ur NEGATIVE NEGATIVE mg/dL   Protein, ur NEGATIVE NEGATIVE mg/dL   Nitrite NEGATIVE NEGATIVE   Leukocytes, UA SMALL (A) NEGATIVE   RBC / HPF 0-5 0 - 5 RBC/hpf   WBC, UA 0-5 0 - 5 WBC/hpf   Bacteria, UA NONE SEEN NONE SEEN   Squamous Epithelial / LPF 0-5 (A) NONE SEEN  Comprehensive metabolic panel  Result Value Ref Range   Sodium 141 135 - 145 mmol/L   Potassium 4.2 3.5 - 5.1 mmol/L   Chloride 108 101 - 111 mmol/L   CO2 28 22 - 32 mmol/L   Glucose, Bld 103 (H) 65 - 99 mg/dL   BUN 14 6 - 20 mg/dL   Creatinine, Ser 1.11 (H) 0.44 - 1.00 mg/dL   Calcium 9.4 8.9 - 10.3 mg/dL   Total Protein 7.3 6.5 - 8.1 g/dL   Albumin 3.8 3.5 - 5.0 g/dL   AST 17 15 - 41 U/L   ALT 12 (L) 14 - 54 U/L   Alkaline Phosphatase 61 38 - 126 U/L   Total Bilirubin 0.4 0.3 - 1.2 mg/dL   GFR calc non Af Amer 55 (L) >60 mL/min   GFR calc Af Amer >60 >60 mL/min   Anion gap 5 5 - 15  Troponin I  Result Value Ref Range   Troponin I <0.03 <0.03 ng/mL  CBC with Differential  Result Value Ref Range   WBC 9.0 4.0 - 10.5 K/uL   RBC 4.19 3.87 - 5.11 MIL/uL   Hemoglobin 12.2 12.0 - 15.0 g/dL   HCT 37.3 36.0 - 46.0 %   MCV 89.0 78.0 - 100.0 fL   MCH 29.1  26.0 - 34.0 pg   MCHC 32.7 30.0 - 36.0 g/dL   RDW 13.6 11.5 - 15.5 %   Platelets 304 150 - 400 K/uL   Neutrophils Relative % 68 %   Neutro Abs 6.0 1.7 - 7.7 K/uL   Lymphocytes Relative 25 %   Lymphs Abs 2.3 0.7 - 4.0 K/uL   Monocytes Relative 6 %   Monocytes Absolute 0.6 0.1 - 1.0 K/uL   Eosinophils Relative 1 %   Eosinophils Absolute 0.1 0.0 - 0.7  K/uL   Basophils Relative 0 %   Basophils Absolute 0.0 0.0 - 0.1 K/uL  D-dimer, quantitative  Result Value Ref Range   D-Dimer, Quant 0.39 0.00 - 0.50 ug/mL-FEU  Troponin I  Result Value Ref Range   Troponin I <0.03 <0.03 ng/mL   Laboratory interpretation all normal except renal insufficiency    EKG  EKG Interpretation  Date/Time:  Thursday December 19 2016 01:21:40 EDT Ventricular Rate:  67 PR Interval:    QRS Duration: 109 QT Interval:  434 QTC Calculation: 459 R Axis:   32 Text Interpretation:  Sinus rhythm Artifact Consider right atrial enlargement Borderline ST elevation, inferior leads Electrode noise No significant change since last tracing 21 Nov 2015 Confirmed by Rolland Porter [id number]) on 12/19/2016 1:51:03 AM       Radiology Dg Chest 2 View  Result Date: 12/19/2016 CLINICAL DATA:  Left-sided chest pain beneath the left breast. EXAM: CHEST  2 VIEW COMPARISON:  10/23/2016 FINDINGS: The heart size and mediastinal contours are within normal limits. Both lungs are clear. There is probable minimal atelectasis and/or scarring adjacent to left heart border. The visualized skeletal structures are unremarkable. IMPRESSION: Minimal atelectasis and/or scarring adjacent to the left heart border at the left lung base. No pneumonic consolidation, effusion or pneumothorax. Electronically Signed   By: Ashley Royalty M.D.   On: 12/19/2016 03:08    Procedures Procedures (including critical care time)  Medications Ordered in ED Medications  ketorolac (TORADOL) 30 MG/ML injection 15 mg (15 mg Intravenous Given 12/19/16 0204)  cyclobenzaprine (FLEXERIL) tablet 10 mg (10 mg Oral Given 12/19/16 0204)     Initial Impression / Assessment and Plan / ED Course  I have reviewed the triage vital signs and the nursing notes.  Pertinent labs & imaging results that were available during my care of the patient were reviewed by me and considered in my medical decision making (see chart for details).       Patient was given Toradol IV and oral Flexeril for her chest wall pain. Blood work was ordered including d-dimer screen for PE.   Recheck at 3 AM patient states her pain is better but not gone. We discussed her test results. We also discussed need to get a delta troponin and she is agreeable.  05:30 AM delta troponin is negative. Pt discharged home. Pt states she has flexeril at home We discussed trying to relax her chest muscles.   Final Clinical Impressions(s) / ED Diagnoses   Final diagnoses:  Chest wall pain  Costochondritis    New Prescriptions New Prescriptions   METHOCARBAMOL (ROBAXIN) 500 MG TABLET    Take 1 or 2 po Q 6hrs for pain     Plan discharge  Rolland Porter, MD, Barbette Or, MD 12/19/16 [phone]

## 2016-12-19 NOTE — Discharge Instructions (Signed)
Use ice and heat for comfort. Take the robaxin for your chest wall pain. You can also take 1 aleve twice a day if needed for pain, do not take it too long or it could affect your kidney function.   Recheck if you get a high fever, struggle to breathe or seem worse. Try to keep your chest muscles relaxed at work.

## 2016-12-19 NOTE — ED Triage Notes (Signed)
Pt reports left flank pain that started last night.  Pt reports vomiting as well several times.  Pt reports pain also going through to her back and at one time had pain in her left neck and left arm.

## 2017-01-20 ENCOUNTER — Telehealth: Payer: Self-pay | Admitting: Adult Health

## 2017-01-20 NOTE — Telephone Encounter (Signed)
Will forward to [place] office.  

## 2017-01-20 NOTE — Telephone Encounter (Signed)
Results of holter monitor / tg

## 2017-01-22 NOTE — Telephone Encounter (Signed)
This was ordered by KL, so results were fwd back to her.  Can you guys make sure she sees it or did she intend for Dr. Harl Bowie to read ?? Results are scanned into EPIC, done on 12/19/2016.

## 2017-01-22 NOTE — Telephone Encounter (Signed)
Will forward to Jory Sims, [place]. For her to review monitor.

## 2017-01-22 NOTE — Telephone Encounter (Signed)
The cardiac monitors and the Holter's are always sent to the cardiologist to read. The results are then put in the chart. I read over the monitor, but none of the cardiologists have read it yet, only printed computer interpretation of the abnormal recordings is  on the report. Do not send me results that are not officially read by cardiologist please.

## 2017-01-23 NOTE — Telephone Encounter (Signed)
I don't see where the monitor was assigned to be read, can we rescan it and assign me as the reader so it shows up in my epic box to read please    [name] MD

## 2017-01-23 NOTE — Telephone Encounter (Addendum)
Monitor has been re scanned to your in box.    Patient made aware of delay in getting those results to her via voice mail.

## 2017-01-23 NOTE — Telephone Encounter (Signed)
I was not aware that [facility] does not sign off on these monitor.  Message will be sent to Dr. Harl Bowie for review of holter.

## 2017-01-23 NOTE — Telephone Encounter (Signed)
You may have to view the results under your (cc charts) on your desktop.  Not sure if you can open any other way since was sent to [facility] initially.

## 2017-01-24 NOTE — Telephone Encounter (Signed)
I put report in today, not sure whose box it came to, not mine. Essentially normal monitor. Curt Bears please review results based on your last clinic visit with her and let nursing staff known your plan   J Amiya Escamilla MD

## 2017-01-24 NOTE — Telephone Encounter (Signed)
I have already spoken to nurses. Thank you for reading monitor

## 2017-01-24 NOTE — Telephone Encounter (Signed)
Patient notified of normal monitor results.  Follow up already scheduled for 01-28-2017 with Dr. Harl Bowie.

## 2017-01-27 ENCOUNTER — Ambulatory Visit (INDEPENDENT_AMBULATORY_CARE_PROVIDER_SITE_OTHER): Payer: BLUE CROSS/BLUE SHIELD | Admitting: Internal Medicine

## 2017-01-27 ENCOUNTER — Encounter (INDEPENDENT_AMBULATORY_CARE_PROVIDER_SITE_OTHER): Payer: Self-pay | Admitting: Internal Medicine

## 2017-01-27 VITALS — BP 146/86 | HR 60 | Temp 98.3°F | Ht 62.5 in | Wt 208.5 lb

## 2017-01-27 DIAGNOSIS — K219 Gastro-esophageal reflux disease without esophagitis: Secondary | ICD-10-CM | POA: Diagnosis not present

## 2017-01-27 NOTE — Patient Instructions (Signed)
GERD diet given to patient.

## 2017-01-27 NOTE — Progress Notes (Signed)
Subjective:    Patient ID: [name] [name], female    DOB: [birth year], 56 y.o.   MRN: [id number] PCP:  HPI  Presents today with c/o She was last seen in June of this year.  Underwent a DG esophagus in December for dysphagia. Exam was normal.  She tells me is still having epigastric pain.  She says it is not heart related. If she leans toward the left she spits up white mucous.  The pain in her epigastric region has been constant.  She avoid corn. If she eats beef, it makes her joints ache.  She eats peanuts in a bar.  She does not avoid spicy foods. She does not follow a GERD diet.  She recently underwent an [place] in July which was normal.    EGD 06/07/2014: Dr. Britta Mccreedy: Dyspepsia and H. Pylori infection. EGD with biopsy.  Normal EGD   01/25/2011 EGD Dr. Posey Pronto: mild gastritis in antrum.       Review of Systems   Past Medical History:  Diagnosis Date  . 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 Chest pain    denied on 06/18/2012  . Chronic kidney disease    L - upper pole- defect, doesn't effect     . Family history of anesthesia complication    brother & neice "flat lined" during induction: neice d/t a medication given for a blood clotting problem; brother d/t "miscalculated amount of anesthesia"  . Fibromyalgia   . GERD (gastroesophageal reflux disease)    h/o colitis   . H. pylori infection    2012- stomach biopsy  . H/O hiatal hernia   . Headache(784.0)    using tylenol prn  . Hyperlipidemia   . Hypertension   . Hypoglycemia   . Normal cardiac stress test    pt. released fr. Dr. [name] care in 10/2011, all findings w/i normal limits  . Overweight(278.02)    Obesity  . Palpitations   . Sinusitis, acute    seen by PCP- 06/17/2012, will treat /w augmentin & tussin/DM  . Uterine fibroid    pt. states that she has a "closed cervix"     Past Surgical History:  Procedure Laterality Date  . COLONOSCOPY    . CRANIOTOMY Right 06/24/2012   Procedure: CRANIOTOMY INTRACRANIAL ANEURYSM  FOR CAROTID;  Surgeon: [name], MD;  Location: [facility]UR[facility];  Service: Neurosurgery;  Laterality: Right;  RIGHT Pterional Craniotomy for aneurysm  . LIPOMA EXCISION     buttock 10/2010    Allergies  Allergen Reactions  . Azithromycin Anaphylaxis    Breathing difficulty, swelling  . Ace Inhibitors Swelling  . Ciprofloxacin Nausea And Vomiting    Also: kidney failure   . Other Swelling, Other (See Comments) and Cough    Sneezing allergy to cats/grass/dust  . Medrol [Methylprednisolone] Palpitations    Heart racing, increased BP  . Tetracyclines & Related Palpitations    Heart racing, increased bp    Current Outpatient Prescriptions on File Prior to Visit  Medication Sig Dispense Refill  . acetaminophen (TYLENOL) 650 MG CR tablet Take 1,300 mg by mouth every 8 (eight) hours as needed for pain or fever.     [name] aspirin EC 81 MG tablet Take 81 mg by mouth 2 (two) times daily.    . Cholecalciferol (D 1000) 1000 UNITS tablet Take 1,000 Units by mouth every evening.     . cyclobenzaprine (FLEXERIL) 5 MG tablet Take 1 tablet (5 mg total) by mouth every 8 (  eight) hours as needed for muscle spasms. 15 tablet 1  . diltiazem (CARDIZEM LA) 240 MG 24 hr tablet Take 180 mg in the morning and 240 mg in the evening (Patient taking differently: Take 180-240 mg by mouth 2 (two) times daily. Take 180 mg in the morning and 240 mg in the evening) 180 tablet 3  . loratadine (CLARITIN) 10 MG tablet Take 10 mg by mouth daily.     . metoprolol tartrate (LOPRESSOR) 50 MG tablet Take 1 tablet (50 mg total) by mouth every morning. & 25 mg in the evening 45 tablet 6  . pantoprazole (PROTONIX) 40 MG tablet Take 40 mg by mouth daily.  1  . PROAIR HFA 108 (90 Base) MCG/ACT inhaler INHALE 2 PUFF EVERY 4 HOURS AS NEEDED  1   No current facility-administered medications on file prior to visit.         Objective:   Physical Exam Blood pressure (!) 146/86, pulse 60, temperature 98.3 F (36.8 C), height 5'  2.5" (1.588 m), weight 208 lb 8 oz (94.6 kg), last menstrual period 08/25/2012. Alert and oriented. Skin warm and dry. Oral mucosa is moist.   . Sclera anicteric, conjunctivae is pink. Thyroid not enlarged. No cervical lymphadenopathy. Lungs clear. Heart regular rate and rhythm.  Abdomen is soft. Bowel sounds are positive. No hepatomegaly. No abdominal masses felt. No tenderness.  No edema to lower extremities.         Assessment & Plan:  Epigastric pain. Am going to give her a GERD diet.  She will call with a PR in 2 weeks.

## 2017-01-28 ENCOUNTER — Encounter: Payer: Self-pay | Admitting: Cardiology

## 2017-01-28 ENCOUNTER — Ambulatory Visit (INDEPENDENT_AMBULATORY_CARE_PROVIDER_SITE_OTHER): Payer: BLUE CROSS/BLUE SHIELD | Admitting: Cardiology

## 2017-01-28 VITALS — BP 150/90 | HR 60 | Ht 62.0 in | Wt 210.0 lb

## 2017-01-28 DIAGNOSIS — I10 Essential (primary) hypertension: Secondary | ICD-10-CM | POA: Diagnosis not present

## 2017-01-28 DIAGNOSIS — R002 Palpitations: Secondary | ICD-10-CM | POA: Diagnosis not present

## 2017-01-28 DIAGNOSIS — R0789 Other chest pain: Secondary | ICD-10-CM | POA: Diagnosis not present

## 2017-01-28 MED ORDER — SPIRONOLACTONE 25 MG PO TABS: 12.5000 mg | ORAL_TABLET | Freq: Every day | ORAL | 1 refills | Status: DC

## 2017-01-28 NOTE — Progress Notes (Signed)
Clinical Summary [name] is a 56 y.o.female seen today for follow up of the following medical problems.   1. Chest pain - long history of atypical chest pain  -  she completed a lexiscan that was overall low risk, small apical defect probable artifact. - seen at [facility] 10/17 with chest pain. CT PE negative.  07/2015 nuclear probable apical thinning, cannot exlcude mild apical ischemia   - has been having left sided pain in ribcage. Tender to palpation. Pain in neck radiating into shoulders and in back of head causing headaches.  - pain unchanged since last visit.   2. Palpitations - admit 03/2014 with palpitations and chest pain. On that day was hypertensive and tachycardic on admission. - MPI without evidence of ischemia, LVEF 78%. Echo "normal LVEF". TSH 1.4. Event monitor with occasional PACs, no significant arrhythmias   - holter 11/2016 no arrhythmias - denies any recent palpitations since last visit   3. HTN - hydralazine previously stopped due to concerns for possible drug induced lupus. Symptoms better.  - home bp's are elevated at times.  Past Medical History:  Diagnosis Date  . 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 Chest pain    denied on 06/18/2012  . Chronic kidney disease    L - upper pole- defect, doesn't effect     . Family history of anesthesia complication    brother & neice "flat lined" during induction: neice d/t a medication given for a blood clotting problem; brother d/t "miscalculated amount of anesthesia"  . Fibromyalgia   . GERD (gastroesophageal reflux disease)    h/o colitis   . H. pylori infection    2012- stomach biopsy  . H/O hiatal hernia   . Headache(784.0)    using tylenol prn  . Hyperlipidemia   . Hypertension   . Hypoglycemia   . Normal cardiac stress test    pt. released fr. Dr. [name] care in 10/2011, all findings w/i normal limits  . Overweight(278.02)    Obesity  . Palpitations   . Sinusitis, acute    seen by PCP-  06/17/2012, will treat /w augmentin & tussin/DM  . Uterine fibroid    pt. states that she has a "closed cervix"      Allergies  Allergen Reactions  . Azithromycin Anaphylaxis    Breathing difficulty, swelling  . Ace Inhibitors Swelling  . Ciprofloxacin Nausea And Vomiting    Also: kidney failure   . Other Swelling, Other (See Comments) and Cough    Sneezing allergy to cats/grass/dust  . Medrol [Methylprednisolone] Palpitations    Heart racing, increased BP  . Tetracyclines & Related Palpitations    Heart racing, increased bp     Current Outpatient Prescriptions  Medication Sig Dispense Refill  . acetaminophen (TYLENOL) 650 MG CR tablet Take 1,300 mg by mouth every 8 (eight) hours as needed for pain or fever.     [name] aspirin EC 81 MG tablet Take 81 mg by mouth 2 (two) times daily.    . Cholecalciferol (D 1000) 1000 UNITS tablet Take 1,000 Units by mouth every evening.     . cyclobenzaprine (FLEXERIL) 5 MG tablet Take 1 tablet (5 mg total) by mouth every 8 (eight) hours as needed for muscle spasms. 15 tablet 1  . diltiazem (CARDIZEM LA) 240 MG 24 hr tablet Take 180 mg in the morning and 240 mg in the evening (Patient taking differently: Take 180-240 mg by mouth 2 (two) times daily. Take 180  mg in the morning and 240 mg in the evening) 180 tablet 3  . loratadine (CLARITIN) 10 MG tablet Take 10 mg by mouth daily.     . metoprolol tartrate (LOPRESSOR) 50 MG tablet Take 1 tablet (50 mg total) by mouth every morning. & 25 mg in the evening 45 tablet 6  . pantoprazole (PROTONIX) 40 MG tablet Take 40 mg by mouth daily.  1  . PROAIR HFA 108 (90 Base) MCG/ACT inhaler INHALE 2 PUFF EVERY 4 HOURS AS NEEDED  1   No current facility-administered medications for this visit.      Past Surgical History:  Procedure Laterality Date  . COLONOSCOPY    . CRANIOTOMY Right 06/24/2012   Procedure: CRANIOTOMY INTRACRANIAL ANEURYSM FOR CAROTID;  Surgeon: [name], MD;  Location: [place] [facility];   Service: Neurosurgery;  Laterality: Right;  RIGHT Pterional Craniotomy for aneurysm  . LIPOMA EXCISION     buttock 10/2010     Allergies  Allergen Reactions  . Azithromycin Anaphylaxis    Breathing difficulty, swelling  . Ace Inhibitors Swelling  . Ciprofloxacin Nausea And Vomiting    Also: kidney failure   . Other Swelling, Other (See Comments) and Cough    Sneezing allergy to cats/grass/dust  . Medrol [Methylprednisolone] Palpitations    Heart racing, increased BP  . Tetracyclines & Related Palpitations    Heart racing, increased bp      Family History  Problem Relation Age of Onset  . Hypertension Other   . Coronary artery disease Other   . Dementia Mother   . Dementia Maternal Aunt      Social History [name] reports that she quit smoking about 12 years ago. Her smoking use included Cigarettes. She started smoking about 27 years ago. She has a 12.00 pack-year smoking history. She has never used smokeless tobacco. Ms. Mcneff reports that she does not drink alcohol.   Review of Systems CONSTITUTIONAL: No weight loss, fever, chills, weakness or fatigue.  HEENT: Eyes: No visual loss, blurred vision, double vision or yellow sclerae.No hearing loss, sneezing, congestion, runny nose or sore throat.  SKIN: No rash or itching.  CARDIOVASCULAR: per hpi RESPIRATORY: No shortness of breath, cough or sputum.  GASTROINTESTINAL: No anorexia, nausea, vomiting or diarrhea. No abdominal pain or blood.  GENITOURINARY: No burning on urination, no polyuria NEUROLOGICAL: No headache, dizziness, syncope, paralysis, ataxia, numbness or tingling in the extremities. No change in bowel or bladder control.  MUSCULOSKELETAL: No muscle, back pain, joint pain or stiffness.  LYMPHATICS: No enlarged nodes. No history of splenectomy.  PSYCHIATRIC: No history of depression or anxiety.  ENDOCRINOLOGIC: No reports of sweating, cold or heat intolerance. No polyuria or polydipsia.   [name]   Physical Examination Vitals:   01/28/17 1431  BP: (!) 150/90  Pulse: 60  SpO2: 97%   Vitals:   01/28/17 1431  Weight: 210 lb (95.3 kg)  Height: 5\' 2"  (1.575 m)    Gen: resting comfortably, no acute distress HEENT: no scleral icterus, pupils equal round and reactive, no palptable cervical adenopathy,  CV: RRR, no m/r/g, no jvd Resp: Clear to auscultation bilaterally GI: abdomen is soft, non-tender, non-distended, normal bowel sounds, no hepatosplenomegaly MSK: extremities are warm, no edema.  Skin: warm, no rash Neuro:  no focal deficits Psych: appropriate affect   Diagnostic Studies 05/2009 Nuclear stress No ischemia  03/2014 Echo Study Conclusions  - Left ventricle: The cavity size was normal. Systolic function was normal. Wall motion was normal; there  were no regional wall motion abnormalities.  03/2014 Lexiscan MPI IMPRESSION: 1. No reversible ischemia or infarction.  2. Normal left ventricular wall motion.  3. Left ventricular ejection fraction 78%  4. Low-risk stress test findings*.  03/2014 Event Monitor Symptoms correlate with NSR. One episode of fluttering showed 2 PACs. No significant arrhythmias  07/2015 Lexiscan MPI  There was no ST segment deviation noted during stress.  This is a low risk study.  The left ventricular ejection fraction is mildly decreased (45-54%). LVEF is 49% just slightly below normal, visually appears normal. Consider correlating with echo.  There is a small mild intensity distal anterior and apical defect with mild reversibility. The apex has normal wall motion. Finding is likely secondary to apical thinning, there is also significant radiotracer uptake in the adjacent gut which may create some artifact. Cannot rule would mild apical ischemia. Overall findings are low risk.  11/2016 holter  Min HR 42, Max HR 94, Avg HR 57  No supraventricular or ventricular ectopy  Telemetry tracings show sinus  bradycardia and sinus rhythm  No symptoms reported  No significant arrhythmias   Assessment and Plan   1. Chest pain - chronic atypical symptoms, ischemic testing over the years has been negative including most recently a nuclear stress test 07/2015 - continue to monitor.   2. Palpitations - recent negative holter. Continue beta blocker and CCB  3. HTN - bp above goal, start aldactone 12.5mg  daily. Check BMET in 2 weeks.      Arnoldo Lenis, M.D.

## 2017-01-28 NOTE — Patient Instructions (Signed)
Your physician recommends that you schedule a follow-up appointment in: 4 MONTHS WITH DR BRANCH  Your physician has recommended you make the following change in your medication:   START ALDACTONE 12.5 MG (1/2 TABLET) DAILY   Your physician recommends that you return for lab work in: 2 WEEKS BMP  Thank you for choosing  [facility]!!     

## 2017-02-03 ENCOUNTER — Telehealth: Payer: Self-pay

## 2017-02-03 MED ORDER — CHLORTHALIDONE 25 MG PO TABS: 12.5000 mg | ORAL_TABLET | Freq: Every day | ORAL | 3 refills | Status: DC

## 2017-02-03 NOTE — Telephone Encounter (Signed)
On a low dose of spironolactone so I would not expect such reactions. That being said, can switch to chlorthalidone 12.5 mg daily to see if this helps to control BP.

## 2017-02-03 NOTE — Telephone Encounter (Signed)
Patient notified. New prescription sent to pharmacy 

## 2017-02-03 NOTE — Telephone Encounter (Signed)
Patient started on Aldactone on 10/2. Patient did not start taking Aldactone until 10/7. Patient states today she has felt very off and shaky. BP while on the phone was 160/98 HR 84. Earlier today BP was 143/91 HR 73. Yesterday BP was 113/76 HR 53. Patient is very concerned the aldactone is causing her to feel this way. Patient denies any rash, swelling, chest pain or shortness of breath.

## 2017-02-05 ENCOUNTER — Other Ambulatory Visit: Payer: Self-pay | Admitting: Adult Health

## 2017-02-10 NOTE — Telephone Encounter (Signed)
Can we f/u with this patient   J Mckinnon Glick MD

## 2017-02-10 NOTE — Telephone Encounter (Signed)
Pt says she has just started chlorthalidone today. Says she monitor BP and update [place] in a week.

## 2017-02-14 DIAGNOSIS — Z1231 Encounter for screening mammogram for malignant neoplasm of breast: Secondary | ICD-10-CM | POA: Diagnosis not present

## 2017-02-26 ENCOUNTER — Other Ambulatory Visit: Payer: Self-pay | Admitting: Adult Health

## 2017-03-12 DIAGNOSIS — I671 Cerebral aneurysm, nonruptured: Secondary | ICD-10-CM | POA: Diagnosis not present

## 2017-03-12 DIAGNOSIS — E782 Mixed hyperlipidemia: Secondary | ICD-10-CM | POA: Diagnosis not present

## 2017-03-12 DIAGNOSIS — N183 Chronic kidney disease, stage 3 (moderate): Secondary | ICD-10-CM | POA: Diagnosis not present

## 2017-03-12 DIAGNOSIS — I10 Essential (primary) hypertension: Secondary | ICD-10-CM | POA: Diagnosis not present

## 2017-03-16 ENCOUNTER — Emergency Department (HOSPITAL_COMMUNITY)
Admission: EM | Admit: 2017-03-16 | Discharge: 2017-03-16 | Disposition: A | Discharge status: Home / Self Care | Payer: BLUE CROSS/BLUE SHIELD | Attending: Emergency Medicine | Admitting: Emergency Medicine

## 2017-03-16 ENCOUNTER — Encounter (HOSPITAL_COMMUNITY): Payer: Self-pay | Admitting: Emergency Medicine

## 2017-03-16 ENCOUNTER — Emergency Department (HOSPITAL_COMMUNITY): Payer: BLUE CROSS/BLUE SHIELD

## 2017-03-16 DIAGNOSIS — I129 Hypertensive chronic kidney disease with stage 1 through stage 4 chronic kidney disease, or unspecified chronic kidney disease: Secondary | ICD-10-CM | POA: Diagnosis not present

## 2017-03-16 DIAGNOSIS — Z7982 Long term (current) use of aspirin: Secondary | ICD-10-CM | POA: Insufficient documentation

## 2017-03-16 DIAGNOSIS — E860 Dehydration: Secondary | ICD-10-CM

## 2017-03-16 DIAGNOSIS — Z79899 Other long term (current) drug therapy: Secondary | ICD-10-CM | POA: Insufficient documentation

## 2017-03-16 DIAGNOSIS — K226 Gastro-esophageal laceration-hemorrhage syndrome: Secondary | ICD-10-CM

## 2017-03-16 DIAGNOSIS — Z87891 Personal history of nicotine dependence: Secondary | ICD-10-CM | POA: Diagnosis not present

## 2017-03-16 DIAGNOSIS — R112 Nausea with vomiting, unspecified: Secondary | ICD-10-CM | POA: Diagnosis not present

## 2017-03-16 DIAGNOSIS — R197 Diarrhea, unspecified: Secondary | ICD-10-CM | POA: Insufficient documentation

## 2017-03-16 DIAGNOSIS — N189 Chronic kidney disease, unspecified: Secondary | ICD-10-CM | POA: Insufficient documentation

## 2017-03-16 DIAGNOSIS — K2970 Gastritis, unspecified, without bleeding: Secondary | ICD-10-CM | POA: Diagnosis not present

## 2017-03-16 DIAGNOSIS — R05 Cough: Secondary | ICD-10-CM | POA: Diagnosis not present

## 2017-03-16 LAB — COMPREHENSIVE METABOLIC PANEL
ALBUMIN: 4.3 g/dL (ref 3.5–5.0)
ALK PHOS: 75 U/L (ref 38–126)
ALT: 13 U/L — ABNORMAL LOW (ref 14–54)
AST: 19 U/L (ref 15–41)
Anion gap: 10 (ref 5–15)
BUN: 23 mg/dL — ABNORMAL HIGH (ref 6–20)
CHLORIDE: 105 mmol/L (ref 101–111)
CO2: 27 mmol/L (ref 22–32)
CREATININE: 1.18 mg/dL — AB (ref 0.44–1.00)
Calcium: 10.1 mg/dL (ref 8.9–10.3)
GFR, EST AFRICAN AMERICAN: 59 mL/min — AB (ref >60)
GFR, EST NON AFRICAN AMERICAN: 51 mL/min — AB (ref >60)
Glucose, Bld: 109 mg/dL — ABNORMAL HIGH (ref 65–99)
POTASSIUM: 3.4 mmol/L — AB (ref 3.5–5.1)
SODIUM: 142 mmol/L (ref 135–145)
Total Bilirubin: 0.5 mg/dL (ref 0.3–1.2)
Total Protein: 7.9 g/dL (ref 6.5–8.1)

## 2017-03-16 LAB — CBC WITH DIFFERENTIAL/PLATELET
BASOS ABS: 0 10*3/uL (ref 0.0–0.1)
BASOS PCT: 0 %
EOS ABS: 0.1 10*3/uL (ref 0.0–0.7)
Eosinophils Relative: 1 %
HEMATOCRIT: 38.8 % (ref 36.0–46.0)
HEMOGLOBIN: 12.6 g/dL (ref 12.0–15.0)
Lymphocytes Relative: 16 %
Lymphs Abs: 1.5 10*3/uL (ref 0.7–4.0)
MCH: 28.8 pg (ref 26.0–34.0)
MCHC: 32.5 g/dL (ref 30.0–36.0)
MCV: 88.6 fL (ref 78.0–100.0)
Monocytes Absolute: 0.4 10*3/uL (ref 0.1–1.0)
Monocytes Relative: 5 %
NEUTROS ABS: 7.3 10*3/uL (ref 1.7–7.7)
NEUTROS PCT: 78 %
Platelets: 346 10*3/uL (ref 150–400)
RBC: 4.38 MIL/uL (ref 3.87–5.11)
RDW: 12.9 % (ref 11.5–15.5)
WBC: 9.3 10*3/uL (ref 4.0–10.5)

## 2017-03-16 LAB — URINALYSIS, ROUTINE W REFLEX MICROSCOPIC
Bilirubin Urine: NEGATIVE
Glucose, UA: NEGATIVE mg/dL
HGB URINE DIPSTICK: NEGATIVE
Ketones, ur: NEGATIVE mg/dL
Leukocytes, UA: NEGATIVE
Nitrite: NEGATIVE
PROTEIN: NEGATIVE mg/dL
Specific Gravity, Urine: 1.004 — ABNORMAL LOW (ref 1.005–1.030)
pH: 6 (ref 5.0–8.0)

## 2017-03-16 LAB — LIPASE, BLOOD: LIPASE: 35 U/L (ref 11–51)

## 2017-03-16 LAB — D-DIMER, QUANTITATIVE (NOT AT ARMC): D DIMER QUANT: 0.45 ug{FEU}/mL (ref 0.00–0.50)

## 2017-03-16 MED ORDER — SODIUM CHLORIDE 0.9 % IV BOLUS (SEPSIS)
1000.0000 mL | Freq: Once | INTRAVENOUS | Status: AC
  Administered 2017-03-16: 1000 mL via INTRAVENOUS

## 2017-03-16 MED ORDER — ONDANSETRON 4 MG PO TBDP: 4.0000 mg | ORAL_TABLET | Freq: Three times a day (TID) | ORAL | 0 refills | Status: DC | PRN

## 2017-03-16 MED ORDER — ONDANSETRON HCL 4 MG/2ML IJ SOLN
4.0000 mg | Freq: Once | INTRAMUSCULAR | Status: AC
  Administered 2017-03-16: 4 mg via INTRAVENOUS
  Filled 2017-03-16: qty 2

## 2017-03-16 NOTE — ED Notes (Signed)
Pt tolerating po sprite well.

## 2017-03-16 NOTE — ED Provider Notes (Signed)
[facility] EMERGENCY DEPARTMENT Provider Note   CSN: [id number] Arrival date & time: 03/16/17  8841     History   Chief Complaint Chief Complaint  Patient presents with  . Diarrhea    HPI [name] [name] is a 56 y.o. female.  Pt presents to the ED today with n/v/d.  She has seen some blood in her vomit a few times.  She denies any lower abdominal pain, but does have epigastric pain.  She c/o pain to the left posterior chest.  ? Coughing up blood.  She also said she had some left leg pain.  She denies any swelling to her leg.  She does take asa 81 mg bid.      Past Medical History:  Diagnosis Date  . 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 Chest pain    denied on 06/18/2012  . Chronic kidney disease    L - upper pole- defect, doesn't effect     . Family history of anesthesia complication    brother & neice "flat lined" during induction: neice d/t a medication given for a blood clotting problem; brother d/t "miscalculated amount of anesthesia"  . Fibromyalgia   . GERD (gastroesophageal reflux disease)    h/o colitis   . H. pylori infection    2012- stomach biopsy  . H/O hiatal hernia   . Headache(784.0)    using tylenol prn  . Hyperlipidemia   . Hypertension   . Hypoglycemia   . Normal cardiac stress test    pt. released fr. Dr. [name] care in 10/2011, all findings w/i normal limits  . Overweight(278.02)    Obesity  . Palpitations   . Sinusitis, acute    seen by PCP- 06/17/2012, will treat /w augmentin & tussin/DM  . Uterine fibroid    pt. states that she has a "closed cervix"     Patient Active Problem List   Diagnosis Date Noted  . Neck pain 12/02/2016  . Bilateral occipital neuralgia 12/02/2016  . Cervical neuralgia 03/25/2016  . Tension headache 03/25/2016  . Chest tightness or pressure 04/03/2014  . Dizziness 04/03/2014  . Headache 04/03/2014  . Chest pain 04/02/2014  . GERD (gastroesophageal reflux disease) 04/02/2014  . Meningitis, unspecified(322.9)  08/11/2012  . 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 HLD (hyperlipidemia) 01/05/2009  . OVERWEIGHT/OBESITY 01/05/2009  . Essential hypertension 01/05/2009  . Palpitations 01/05/2009  . CHEST PAIN-UNSPECIFIED 01/05/2009    Past Surgical History:  Procedure Laterality Date  . COLONOSCOPY    . CRANIOTOMY INTRACRANIAL ANEURYSM FOR CAROTID Right 06/24/2012   Performed by [name], MD at [facility] NEURO ORS  . LIPOMA EXCISION     buttock 10/2010    OB History    No data available       Home Medications    Prior to Admission medications   Medication Sig Start Date End Date Taking? Authorizing Provider  acetaminophen (TYLENOL) 650 MG CR tablet Take 1,300 mg by mouth every 8 (eight) hours as needed for pain or fever.     [provider]  aspirin EC 81 MG tablet Take 81 mg by mouth 2 (two) times daily.    [provider]  chlorthalidone (HYGROTON) 25 MG tablet Take 0.5 tablets (12.5 mg total) by mouth daily. 02/03/17 05/04/17  Herminio Commons, MD  Cholecalciferol (D 1000) 1000 UNITS tablet Take 1,000 Units by mouth every evening.     [provider]  cyclobenzaprine (FLEXERIL) 5 MG tablet Take 1 tablet (5  mg total) by mouth every 8 (eight) hours as needed for muscle spasms. 12/02/16   Rosalin Hawking, MD  diltiazem (CARDIZEM CD) 240 MG 24 hr capsule TAKE 1 CAPSULE DAILY 02/27/17   Arnoldo Lenis, MD  diltiazem (CARDIZEM LA) 240 MG 24 hr tablet Take 180 mg in the morning and 240 mg in the evening Patient taking differently: Take 180-240 mg by mouth 2 (two) times daily. Take 180 mg in the morning and 240 mg in the evening 01/06/15   Arnoldo Lenis, MD  loratadine (CLARITIN) 10 MG tablet Take 10 mg by mouth daily.     [provider]  metoprolol tartrate (LOPRESSOR) 50 MG tablet Take 1 tablet (50 mg total) by mouth every morning. & 25 mg in the evening 12/05/16   Lendon Colonel, NP  ondansetron (ZOFRAN ODT) 4 MG disintegrating tablet Take 1 tablet  (4 mg total) every 8 (eight) hours as needed by mouth. 03/16/17   Isla Pence, MD  pantoprazole (PROTONIX) 40 MG tablet Take 40 mg by mouth daily. 03/04/14   [provider]  PROAIR HFA 108 (90 Base) MCG/ACT inhaler INHALE 2 PUFF EVERY 4 HOURS AS NEEDED 06/06/15   [provider]    Family History Family History  Problem Relation Age of Onset  . Hypertension Other   . Coronary artery disease Other   . Dementia Mother   . Dementia Maternal Aunt     Social History Social History   Tobacco Use  . Smoking status: Former Smoker    Packs/day: 0.80    Years: 15.00    Pack years: 12.00    Types: Cigarettes    Start date: 02/13/1989    Last attempt to quit: 04/28/2004    Years since quitting: 12.8  . Smokeless tobacco: Never Used  Substance Use Topics  . Alcohol use: No    Alcohol/week: 0.0 oz  . Drug use: No     Allergies   Azithromycin; Ace inhibitors; Ciprofloxacin; Other; Medrol [methylprednisolone]; and Tetracyclines & related   Review of Systems Review of Systems  Respiratory: Positive for cough.   Cardiovascular: Positive for chest pain.  Gastrointestinal: Positive for diarrhea, nausea and vomiting.  All other systems reviewed and are negative.    Physical Exam Updated Vital Signs BP (!) 157/91 (BP Location: Left Arm)   Pulse 64   Temp 98 F (36.7 C) (Oral)   Resp 16   Ht 5\' 2"  (1.575 m)   Wt 90.3 kg (199 lb)   LMP 08/25/2012   SpO2 100%   BMI 36.40 kg/m   Physical Exam  Constitutional: She is oriented to person, place, and time. She appears well-developed and well-nourished.  HENT:  Head: Normocephalic and atraumatic.  Right Ear: External ear normal.  Left Ear: External ear normal.  Nose: Nose normal.  Mouth/Throat: Oropharynx is clear and moist.  Eyes: Conjunctivae and EOM are normal. Pupils are equal, round, and reactive to light.  Neck: Normal range of motion. Neck supple.  Cardiovascular: Normal rate, regular rhythm, normal  heart sounds and intact distal pulses.  Pulmonary/Chest: Effort normal and breath sounds normal.  Abdominal: Soft. Bowel sounds are normal.  Musculoskeletal: Normal range of motion.  Neurological: She is alert and oriented to person, place, and time.  Skin: Skin is warm and dry. Capillary refill takes less than 2 seconds.  Psychiatric: She has a normal mood and affect. Her behavior is normal. Judgment and thought content normal.  Nursing note and vitals reviewed.  ED Treatments / Results  Labs (all labs ordered are listed, but only abnormal results are displayed) Labs Reviewed  COMPREHENSIVE METABOLIC PANEL - Abnormal; Notable for the following components:      Result Value   Potassium 3.4 (*)    Glucose, Bld 109 (*)    BUN 23 (*)    Creatinine, Ser 1.18 (*)    ALT 13 (*)    GFR calc non Af Amer 51 (*)    GFR calc Af Amer 59 (*)    All other components within normal limits  URINALYSIS, ROUTINE W REFLEX MICROSCOPIC - Abnormal; Notable for the following components:   Color, Urine STRAW (*)    Specific Gravity, Urine 1.004 (*)    All other components within normal limits  GASTROINTESTINAL PANEL BY PCR, STOOL (REPLACES STOOL CULTURE)  C DIFFICILE QUICK SCREEN W PCR REFLEX  CBC WITH DIFFERENTIAL/PLATELET  LIPASE, BLOOD  D-DIMER, QUANTITATIVE (NOT AT [facility])    EKG  EKG Interpretation  Date/Time:  Sunday March 16 2017 08:29:35 EST Ventricular Rate:  62 PR Interval:    QRS Duration: 100 QT Interval:  452 QTC Calculation: 459 R Axis:   13 Text Interpretation:  Sinus rhythm Low voltage, precordial leads Confirmed by Adamary Savary ([id number]) on 03/16/2017 9:00:16 AM       Radiology Dg Chest 2 View  Result Date: 03/16/2017 CLINICAL DATA:  Cough EXAM: CHEST  2 VIEW COMPARISON:  12/19/2016 FINDINGS: Stable atelectasis or scarring at the left base. Favor scarring. Right lung is clear. Heart is normal size. No effusions or acute bony abnormality. IMPRESSION: Stable  lingular scarring or atelectasis.  No active disease. Electronically Signed   By: Kevin  Dover M.D.   On: 03/16/2017 08:49    Procedures Procedures (including critical care time)  Medications Ordered in ED Medications  ondansetron (ZOFRAN) injection 4 mg (4 mg Intravenous Given 03/16/17 0756)  sodium chloride 0.9 % bolus 1,000 mL (0 mLs Intravenous Stopped 03/16/17 0851)     Initial Impression / Assessment and Plan / ED Course  I have reviewed the triage vital signs and the nursing notes.  Pertinent labs & imaging results that were available during my care of the patient were reviewed by me and considered in my medical decision making (see chart for details).     Pt is feeling much better after IVFs and zofran.  She is able to tolerate po fluids.  She has not had any diarrhea while here.  Pt to f/u with her GI doctor, but suspect the hematemesis is from a small mallory weiss tear from forceful vomiting.  Pt is stable for d/c.  Final Clinical Impressions(s) / ED Diagnoses   Final diagnoses:  Nausea vomiting and diarrhea  Dehydration  Mallory-Weiss tear    ED Discharge Orders        Ordered    ondansetron (ZOFRAN ODT) 4 MG disintegrating tablet  Every 8 hours PRN     11 /18/18 0957       Isla Pence, MD 03/16/17 1014

## 2017-03-16 NOTE — ED Triage Notes (Signed)
Pt states she woke up about an hour ago with diarrhea x 2 and vomiting x 2.  States she feels what she threw up was blood because it was pink.  Then states she began coughing and it looked like blood as well.  Denies pain at this time.

## 2017-03-18 DIAGNOSIS — E6609 Other obesity due to excess calories: Secondary | ICD-10-CM | POA: Diagnosis not present

## 2017-03-18 DIAGNOSIS — E782 Mixed hyperlipidemia: Secondary | ICD-10-CM | POA: Diagnosis not present

## 2017-03-18 DIAGNOSIS — G43909 Migraine, unspecified, not intractable, without status migrainosus: Secondary | ICD-10-CM | POA: Diagnosis not present

## 2017-03-18 DIAGNOSIS — I10 Essential (primary) hypertension: Secondary | ICD-10-CM | POA: Diagnosis not present

## 2017-04-16 ENCOUNTER — Ambulatory Visit (INDEPENDENT_AMBULATORY_CARE_PROVIDER_SITE_OTHER): Payer: BLUE CROSS/BLUE SHIELD | Admitting: Internal Medicine

## 2017-04-30 DIAGNOSIS — R05 Cough: Secondary | ICD-10-CM | POA: Diagnosis not present

## 2017-04-30 DIAGNOSIS — I482 Chronic atrial fibrillation: Secondary | ICD-10-CM | POA: Diagnosis not present

## 2017-04-30 DIAGNOSIS — Z6836 Body mass index (BMI) 36.0-36.9, adult: Secondary | ICD-10-CM | POA: Diagnosis not present

## 2017-04-30 DIAGNOSIS — J209 Acute bronchitis, unspecified: Secondary | ICD-10-CM | POA: Diagnosis not present

## 2017-04-30 DIAGNOSIS — R509 Fever, unspecified: Secondary | ICD-10-CM | POA: Diagnosis not present

## 2017-06-03 DIAGNOSIS — M25562 Pain in left knee: Secondary | ICD-10-CM | POA: Diagnosis not present

## 2017-06-03 DIAGNOSIS — M940 Chondrocostal junction syndrome [Tietze]: Secondary | ICD-10-CM | POA: Diagnosis not present

## 2017-06-03 DIAGNOSIS — R000 Tachycardia, unspecified: Secondary | ICD-10-CM | POA: Diagnosis not present

## 2017-06-03 DIAGNOSIS — R072 Precordial pain: Secondary | ICD-10-CM | POA: Diagnosis not present

## 2017-06-03 NOTE — Progress Notes (Signed)
[facility]  PATIENT: [name] [name] DOB: 06-Sep-1960   REASON FOR VISIT: Follow-up for headache HISTORY FROM: Patient    HISTORY OF PRESENT ILLNESS:[name] [name] is a 57 y.o. female with PMH of HTN, HLD, right ICA terminus aneurysm s/p clipping in 2013 and headache who presents as a new patient for Headache.   Patient was following with Dr.penumalli in 2013 for headache. At that time, she complained of intermittent headache, right or left-sided, associated with neck pain, left shoulder pain, nausea, blurred vision, and dizziness. Headaches were proceeded by feeding of sore spots over the scalp. She took Tylenol for headache. However she had MRI and MRA at that time showed 45 mm terminal right ICA aneurysm. MRI C-spine showed minimal degenerative changes without significant spinal stenosis. She underwent aneurysm clipping by Dr. Cyndy Freeze and after that her headache was gone for several years.  However, since 6 months ago, the headache gradually coming back, the headache was very similar to previous headache, starting with lower neck pain, bilateral shoulder pain, up to the back of her head, either left or right-sided with neck tightness. Headache more pressure, dull feeling, sometimes lasting 2-3 days. Tylenol works if takes early. For the last 2 weeks, headache seems getting worse. At that same time he had right hand cramping after overexerting the right hand in a party, as well as sometimes left arm tingling. Went to see PCP, had a stress test which was negative and also had MRI/MRA showed previous aneurysm clipping without change, however, also showed symmetric mild signal along bilateral corticospinal tracts, progressed from 2015. It reported this finding is nonspecific however may be seen associated with ALS. Due to this abnormal findings, patient was referred here for further evaluation.  She has history of hypertension, on Cardizem, hydralazine, and metoprolol.  Recently her blood pressure was on the lower side and her PCP is trying to taper off hydralazine. Today blood pressure 99/60. Questionable history of fibromyalgia. He is also on aspirin daily. Denies any alcohol smoking or using illicit drugs.  03/25/16 follow up - she has had some improvement with PT/OT. However, she still has intermittent mild HA at back of head and neck pain and tight. She sometimes has tenderness along the occipital nerve distribution. She did not start amitriptyline as she had confusion with dosing. She uses lidocaine cream PRN. She admitted that in the morning seems less symptoms and as the day goes by, she may feel more symptoms. She told me that she also has fibromyalgia. BP today 115/75.   07/02/16 follow up - pt has been doing well. HA intermittent but improved. Currently no HA. However, on 06/19/16, she was at work, suddenly she had one sharp pain at right side of face, lasted several min, and she also started to have right arm and right chest pain, BP up, she got up and walking out to hallway, about to pass out, her co-worker called EMS and she was sent to ED. At ED, her right facial pain gone, but started to have right dull HA, and still has some mild right arm and chest pain, EKG negative. She had MRI again demonstrated b/l corticospinal tract hyper T2 signal, unchanged. MRA neck negative but MRA head showed left supraclinoid ICA 82mm, concerning for small aneurysm. She followed with NSG Dr. Christella Noa and no intervention needed and will consider CTA head if needed. However, when comparing with her CTA head in 2014, it was unchanged. Pt came in today want to seek my opinion on  that.   Interval history:12/02/16 Dr. Erlinda Hong During the interval time, pt has been doing the same. Still has headache, 3-5 times a month, lasting 2 days average. Headache more at top and back of the head and behind eyes, right more than left. Most of time headache most severe, but sometimes can go up to 7/10. Not  able to tolerating Cymbalta which was discontinued. Has changed several pillows for neck pain. Follow with Dr. 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 She was discharged from neurosurgery service. BP today 130/80 UPDATE 2/6/2019CM  during the interval time patient has been doing well he still has 2-3 headaches.  She occasionally takes Tylenol.  She has a prescription product for Flexeril but really does not take the medication and was encouraged to do so.  Blood pressure in the office today 99/66.  She is followed by cardiology for irregular heartbeat.  He says her headaches are mainly due to increased stress.  She is not aware of any headache triggers.  She feels that things are stable.  She returns for reevaluation.  She does have sprained knee on the left and is wearing a brace REVIEW OF SYSTEMS: Full 14 system review of systems performed and notable only for those listed, all others are neg:  Constitutional: neg  Cardiovascular: neg Ear/Nose/Throat: neg  Skin: neg Eyes: neg Respiratory: Cough Gastroitestinal: neg  Hematology/Lymphatic: neg  Endocrine: neg Musculoskeletal: Neck pain, back pain Allergy/Immunology: Environmental allergies Neurological: Headache dizziness Psychiatric: neg Sleep : neg   ALLERGIES: Allergies  Allergen Reactions  . Ace Inhibitors Swelling  . Azithromycin Anaphylaxis    Breathing difficulty, swelling  . Ciprofloxacin Nausea And Vomiting    Also: kidney failure   . Other Swelling, Other (See Comments) and Cough    Sneezing allergy to cats/grass/dust  . Medrol [Methylprednisolone] Palpitations    Heart racing, increased BP  . Tetracyclines & Related Palpitations    Heart racing, increased bp    HOME MEDICATIONS: Outpatient Medications Prior to Visit  Medication Sig Dispense Refill  . acetaminophen (TYLENOL) 650 MG CR tablet Take 1,300 mg by mouth every 8 (eight) hours as  needed for pain or fever.     [name] albuterol (PROAIR HFA) 108 (90 Base) MCG/ACT inhaler INHALE 2 PUFF EVERY 4 HOURS AS NEEDED    . aspirin EC 81 MG tablet Take 81 mg by mouth 2 (two) times daily.    . chlorthalidone (HYGROTON) 25 MG tablet Take 0.5 tablets (12.5 mg total) by mouth daily. 30 tablet 3  . Cholecalciferol (D 1000) 1000 UNITS tablet Take 1,000 Units by mouth every evening.     . cyclobenzaprine (FLEXERIL) 5 MG tablet Take 1 tablet (5 mg total) by mouth every 8 (eight) hours as needed for muscle spasms. 15 tablet 1  . diltiazem (CARDIZEM CD) 180 MG 24 hr capsule Take 180 mg by mouth 2 (two) times daily.     [name] loratadine (CLARITIN) 10 MG tablet Take 10 mg by mouth daily.     [name] lovastatin (MEVACOR) 20 MG tablet TAKE 1 TABLET BY MOUTH EVERY DAY FOR HIGH CHOLESTEROL  3  . metoprolol tartrate (LOPRESSOR) 50 MG tablet Take 1 tablet (50 mg total) by mouth every morning. & 25 mg in the evening 45 tablet 6  . ondansetron (ZOFRAN ODT) 4 MG disintegrating tablet Take 1 tablet (4 mg total) every 8 (eight) hours as needed by mouth. 10 tablet 0  .  pantoprazole (PROTONIX) 40 MG tablet Take 40 mg by mouth daily.  1  . PROAIR HFA 108 (90 Base) MCG/ACT inhaler INHALE 2 PUFF EVERY 4 HOURS AS NEEDED  1  . TROKENDI XR 50 MG CP24 TAKE 1 CAPSULE BY MOUTH EVERYDAY AT BEDTIME TO PREVENT HEADACHES  6  . aspirin EC 81 MG tablet Take 81 mg by mouth.    . diltiazem (CARDIZEM CD) 240 MG 24 hr capsule TAKE 1 CAPSULE DAILY (Patient not taking: Reported on 06/04/2017) 90 capsule 3  . diltiazem (CARDIZEM LA) 240 MG 24 hr tablet Take 180 mg in the morning and 240 mg in the evening (Patient not taking: Reported on 06/04/2017) 180 tablet 3  . metoprolol tartrate (LOPRESSOR) 50 MG tablet Take 50 mg by mouth.     No facility-administered medications prior to visit.     PAST MEDICAL HISTORY: Past Medical History:  Diagnosis Date  . Aneurysm ([place])    brain  . Chest pain    denied on 06/18/2012  . Chronic kidney disease    L  - upper pole- defect, doesn't effect     . Family history of anesthesia complication    brother & neice "flat lined" during induction: neice d/t a medication given for a blood clotting problem; brother d/t "miscalculated amount of anesthesia"  . Fibromyalgia   . GERD (gastroesophageal reflux disease)    h/o colitis   . H. pylori infection    2012- stomach biopsy  . H/O hiatal hernia   . Headache(784.0)    using tylenol prn  . Hyperlipidemia   . Hypertension   . Hypoglycemia   . Normal cardiac stress test    pt. released fr. Dr. [name] care in 10/2011, all findings w/i normal limits  . Overweight(278.02)    Obesity  . Palpitations   . Sinusitis, acute    seen by PCP- 06/17/2012, will treat /w augmentin & tussin/DM  . Uterine fibroid    pt. states that she has a "closed cervix"     PAST SURGICAL HISTORY: Past Surgical History:  Procedure Laterality Date  . COLONOSCOPY    . CRANIOTOMY Right 06/24/2012   Procedure: CRANIOTOMY INTRACRANIAL ANEURYSM FOR CAROTID;  Surgeon: [name], MD;  Location: [facility];  Service: Neurosurgery;  Laterality: Right;  RIGHT Pterional Craniotomy for aneurysm  . LIPOMA EXCISION     buttock 10/2010    FAMILY HISTORY: Family History  Problem Relation Age of Onset  . Hypertension Other   . Coronary artery disease Other   . Dementia Mother   . Dementia Maternal Aunt     SOCIAL HISTORY: Social History   Socioeconomic History  . Marital status: Single    Spouse name: Not on file  . Number of children: Not on file  . Years of education: Not on file  . Highest education level: Not on file  Social Needs  . Financial resource strain: Not on file  . Food insecurity - worry: Not on file  . Food insecurity - inability: Not on file  . Transportation needs - medical: Not on file  . Transportation needs - non-medical: Not on file  Occupational History  . Occupation: Full time    Employer: [employer]  Tobacco Use  . Smoking status:  Former Smoker    Packs/day: 0.80    Years: 15.00    Pack years: 12.00    Types: Cigarettes    Start date: 02/13/1989    Last attempt to quit: 04/28/2004  Years since quitting: 13.1  . Smokeless tobacco: Never Used  Substance and Sexual Activity  . Alcohol use: No    Alcohol/week: 0.0 oz  . Drug use: No  . Sexual activity: No    Birth control/protection: None, Abstinence  Other Topics Concern  . Not on file  Social History Narrative   Single     PHYSICAL EXAM  Vitals:   06/04/17 1023  BP: 99/66  Pulse: 61  Weight: 199 lb (90.3 kg)   Body mass index is 36.4 kg/m.  Generalized: Well developed, obese female in no acute distress  Head: normocephalic and atraumatic,. Oropharynx benign  Neck: Supple,   Musculoskeletal: No deformity   Neurological examination   Mentation: Alert oriented to time, place, history taking. Attention span and concentration appropriate. Recent and remote memory intact.  Follows all commands speech and language fluent.   Cranial nerve II-XII: Pupils were equal round reactive to light extraocular movements were full, visual field were full on confrontational test. Facial sensation and strength were normal. hearing was intact to finger rubbing bilaterally. Uvula tongue midline. head turning and shoulder shrug were normal and symmetric.Tongue protrusion into cheek strength was normal. Motor: normal bulk and tone, full strength in the BUE, BLE, fine finger movements normal, no pronator drift. No focal weakness Sensory: normal and symmetric to light touch, pinprick, and  Vibration, in the upper and lower extremities Coordination: finger-nose-finger, heel-to-shin bilaterally, no dysmetria Reflexes: Symmetric upper and lower, plantar responses were flexor bilaterally. Gait and Station: Rising up from seated position without assistance, normal stance,  moderate stride, good arm swing, smooth turning, able to perform tiptoe, and heel walking without  difficulty. Tandem gait is steady  DIAGNOSTIC DATA (LABS, IMAGING, TESTING) - I reviewed patient records, labs, notes, testing and imaging myself where available.  Lab Results  Component Value Date   WBC 9.3 03/16/2017   HGB 12.6 03/16/2017   HCT 38.8 03/16/2017   MCV 88.6 03/16/2017   PLT 346 03/16/2017      Component Value Date/Time   NA 142 03/16/2017 0759   NA 142 12/19/2015 1142   K 3.4 (L) 03/16/2017 0759   CL 105 03/16/2017 0759   CO2 27 03/16/2017 0759   GLUCOSE 109 (H) 03/16/2017 0759   BUN 23 (H) 03/16/2017 0759   BUN 14 12/19/2015 1142   CREATININE 1.18 (H) 03/16/2017 0759   CALCIUM 10.1 03/16/2017 0759   PROT 7.9 03/16/2017 0759   PROT 7.4 12/19/2015 1142   ALBUMIN 4.3 03/16/2017 0759   ALBUMIN 4.4 12/19/2015 1142   AST 19 03/16/2017 0759   ALT 13 (L) 03/16/2017 0759   ALKPHOS 75 03/16/2017 0759   BILITOT 0.5 03/16/2017 0759   BILITOT 0.4 12/19/2015 1142   GFRNONAA 51 (L) 03/16/2017 0759   GFRAA 59 (L) 03/16/2017 0759   Lab Results  Component Value Date   CHOL 193 12/19/2015   HDL 54 12/19/2015   LDLCALC 120 (H) 12/19/2015   TRIG 97 12/19/2015   CHOLHDL 3.6 12/19/2015   Lab Results  Component Value Date   HGBA1C 6.1 (H) 04/03/2014     ASSESSMENT AND PLAN [name] [name] is a 57 y.o. female with PMH of HTN, HLD, right ICA terminus aneurysm s/p clipping in 2013 and headache who presents for Headache. She had a similar headache in 2013, found to have right terminal ICA aneurysm s/p clipping. Headache was gone for several years but now come back. MRI no acute process except stable right terminal ICA clipping. However  found to have symmetric mild signal along bilateral corticospinal tracts, reportedly may associated with ALS. However, on my review of images, the signals are the same as those in her MRI in 2015, no progression. Pt had another MRI on 06/19/16, which showed same finding and no progression. With clinical correlations, this is not ALS but rather  normal signal. I have reassured patient. If pt has any sign of ALS, will do EMG. Pt agreed.  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  She also has fibromyalgia, put on cymbalta which was later discontinued due to intolerance. Recommend relaxation and de-stress. Overtime, no significant change, will prescribe flexeril PRN For her 74mm left supraclinoid ICA possible aneurysm on 05/31/16 MRA, her CTA in 2014 was reviewed and it was showing similar structure and no progression. Followed with Dr. 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   Plan: - flexeril as needed for muscle tension. - relaxation and cope with stress can help the neuralgia and HA. - can also take tylenol and ASA as needed for tension HA and neck pain.     - will consider EMG/NCS if any sign of ALS.   - follow up in 1 year Dennie Bible, [facility], [facility], APRN  [facility] [address], [place], [place] [address] [phone]

## 2017-06-04 ENCOUNTER — Ambulatory Visit: Payer: BLUE CROSS/BLUE SHIELD | Admitting: Nurse Practitioner

## 2017-06-04 ENCOUNTER — Encounter: Payer: Self-pay | Admitting: Nurse Practitioner

## 2017-06-04 VITALS — BP 99/66 | HR 61 | Wt 199.0 lb

## 2017-06-04 DIAGNOSIS — M542 Cervicalgia: Secondary | ICD-10-CM | POA: Diagnosis not present

## 2017-06-04 DIAGNOSIS — G44209 Tension-type headache, unspecified, not intractable: Secondary | ICD-10-CM | POA: Diagnosis not present

## 2017-06-04 DIAGNOSIS — M5412 Radiculopathy, cervical region: Secondary | ICD-10-CM | POA: Diagnosis not present

## 2017-06-04 MED ORDER — CYCLOBENZAPRINE HCL 5 MG PO TABS: 5.0000 mg | ORAL_TABLET | Freq: Three times a day (TID) | ORAL | 6 refills | Status: DC | PRN

## 2017-06-04 NOTE — Patient Instructions (Signed)
flexeril as needed for muscle tension. - relaxation and cope with stress can help the neuralgia and HA. - can also take tylenol and ASA as needed for tension HA and neck pain.     - will consider EMG/NCS if any sign of ALS.   - follow up in 1 year

## 2017-06-05 NOTE — Progress Notes (Signed)
I have reviewed and agreed above plan. 

## 2017-06-18 DIAGNOSIS — E782 Mixed hyperlipidemia: Secondary | ICD-10-CM | POA: Diagnosis not present

## 2017-06-18 DIAGNOSIS — I10 Essential (primary) hypertension: Secondary | ICD-10-CM | POA: Diagnosis not present

## 2017-06-18 DIAGNOSIS — Z9189 Other specified personal risk factors, not elsewhere classified: Secondary | ICD-10-CM | POA: Diagnosis not present

## 2017-06-23 DIAGNOSIS — I671 Cerebral aneurysm, nonruptured: Secondary | ICD-10-CM | POA: Diagnosis not present

## 2017-06-23 DIAGNOSIS — G43909 Migraine, unspecified, not intractable, without status migrainosus: Secondary | ICD-10-CM | POA: Diagnosis not present

## 2017-06-23 DIAGNOSIS — I10 Essential (primary) hypertension: Secondary | ICD-10-CM | POA: Diagnosis not present

## 2017-06-23 DIAGNOSIS — E782 Mixed hyperlipidemia: Secondary | ICD-10-CM | POA: Diagnosis not present

## 2017-06-23 DIAGNOSIS — R300 Dysuria: Secondary | ICD-10-CM | POA: Diagnosis not present

## 2017-06-24 DIAGNOSIS — M940 Chondrocostal junction syndrome [Tietze]: Secondary | ICD-10-CM | POA: Diagnosis not present

## 2017-06-24 DIAGNOSIS — Z6836 Body mass index (BMI) 36.0-36.9, adult: Secondary | ICD-10-CM | POA: Diagnosis not present

## 2017-07-14 DIAGNOSIS — R6884 Jaw pain: Secondary | ICD-10-CM | POA: Diagnosis not present

## 2017-07-24 DIAGNOSIS — Z6836 Body mass index (BMI) 36.0-36.9, adult: Secondary | ICD-10-CM | POA: Diagnosis not present

## 2017-07-24 DIAGNOSIS — R1013 Epigastric pain: Secondary | ICD-10-CM | POA: Diagnosis not present

## 2017-07-24 DIAGNOSIS — M5489 Other dorsalgia: Secondary | ICD-10-CM | POA: Diagnosis not present

## 2017-07-24 DIAGNOSIS — M797 Fibromyalgia: Secondary | ICD-10-CM | POA: Diagnosis not present

## 2017-07-28 DIAGNOSIS — R1013 Epigastric pain: Secondary | ICD-10-CM | POA: Diagnosis not present

## 2017-07-31 DIAGNOSIS — K760 Fatty (change of) liver, not elsewhere classified: Secondary | ICD-10-CM | POA: Diagnosis not present

## 2017-07-31 DIAGNOSIS — K824 Cholesterolosis of gallbladder: Secondary | ICD-10-CM | POA: Diagnosis not present

## 2017-07-31 DIAGNOSIS — R1013 Epigastric pain: Secondary | ICD-10-CM | POA: Diagnosis not present

## 2017-08-05 DIAGNOSIS — R42 Dizziness and giddiness: Secondary | ICD-10-CM | POA: Diagnosis not present

## 2017-08-05 DIAGNOSIS — Z6836 Body mass index (BMI) 36.0-36.9, adult: Secondary | ICD-10-CM | POA: Diagnosis not present

## 2017-08-05 DIAGNOSIS — R079 Chest pain, unspecified: Secondary | ICD-10-CM | POA: Diagnosis not present

## 2017-08-11 ENCOUNTER — Encounter: Payer: Self-pay | Admitting: Internal Medicine

## 2017-08-11 ENCOUNTER — Ambulatory Visit: Payer: BLUE CROSS/BLUE SHIELD | Admitting: Internal Medicine

## 2017-08-11 VITALS — BP 124/80 | HR 68 | Ht 62.0 in | Wt 196.0 lb

## 2017-08-11 DIAGNOSIS — R002 Palpitations: Secondary | ICD-10-CM | POA: Diagnosis not present

## 2017-08-11 DIAGNOSIS — R079 Chest pain, unspecified: Secondary | ICD-10-CM | POA: Diagnosis not present

## 2017-08-11 DIAGNOSIS — I10 Essential (primary) hypertension: Secondary | ICD-10-CM

## 2017-08-11 MED ORDER — DILTIAZEM HCL ER COATED BEADS 180 MG PO CP24: 180.0000 mg | ORAL_CAPSULE | Freq: Every day | ORAL | 3 refills | Status: DC

## 2017-08-11 MED ORDER — METOPROLOL TARTRATE 50 MG PO TABS: 50.0000 mg | ORAL_TABLET | Freq: Two times a day (BID) | ORAL | 3 refills | Status: DC

## 2017-08-11 NOTE — Progress Notes (Signed)
Cardiology Office Note   Date:  08/11/2017   ID:  [name] [name], DOB March 10, 1961, MRN [id number]  PCP:  [name], MD  Cardiologist:  [name]   Pt returns for f/u of CP     History of Present Illness: [name] [name] is a 57 y.o. female with a history of chest pain   She is followed by [name]  Seen in October 2018  Had lexiscan in April 2017 that showed no ischemia, probable breast attenuation [facility] in 10/17:  CT neg for PE (Aorta normal, no atherosclerosis noted)   Pt also has palpitations    Event monitor with occasional PACss   Holter in Aug 2018 no arrrhythmias     tSince seen the pt has been doing pretty good until recently when she has  had some chest pains  Felt maybe costrochondritis  In 2 wks has had 3 episodes of tachycardia    Takes metoprolol and goes away   Highest HR 140     Current Meds  Medication Sig  . acetaminophen (TYLENOL) 650 MG CR tablet Take 1,300 mg by mouth every 8 (eight) hours as needed for pain or fever.   [name] albuterol (PROAIR HFA) 108 (90 Base) MCG/ACT inhaler INHALE 2 PUFF EVERY 4 HOURS AS NEEDED  . aspirin EC 81 MG tablet Take 81 mg by mouth daily.   . chlorthalidone (HYGROTON) 25 MG tablet Take 0.5 tablets (12.5 mg total) by mouth daily.  . Cholecalciferol (D 1000) 1000 UNITS tablet Take 1,000 Units by mouth every evening.   . cyclobenzaprine (FLEXERIL) 5 MG tablet Take 1 tablet (5 mg total) by mouth every 8 (eight) hours as needed for muscle spasms.  [name] diltiazem (CARDIZEM CD) 180 MG 24 hr capsule Take 180 mg by mouth 2 (two) times daily.   [name] loratadine (CLARITIN) 10 MG tablet Take 10 mg by mouth daily.   . metoprolol tartrate (LOPRESSOR) 50 MG tablet Take 1 tablet (50 mg total) by mouth every morning. & 25 mg in the evening  . pantoprazole (PROTONIX) 40 MG tablet Take 40 mg by mouth daily.  [name] PROAIR HFA 108 (90 Base) MCG/ACT inhaler INHALE 2 PUFF EVERY 4 HOURS AS NEEDED  . TROKENDI XR 50 MG CP24 TAKE 1 CAPSULE BY MOUTH EVERYDAY  AT BEDTIME TO PREVENT HEADACHES  . [DISCONTINUED] ondansetron (ZOFRAN ODT) 4 MG disintegrating tablet Take 1 tablet (4 mg total) every 8 (eight) hours as needed by mouth.     Allergies:   Ace inhibitors; Azithromycin; Ciprofloxacin; Other; Medrol [methylprednisolone]; and Tetracyclines & related   Past Medical History:  Diagnosis Date  . Aneurysm ([place])    brain  . Chest pain    denied on 06/18/2012  . Chronic kidney disease    L - upper pole- defect, doesn't effect     . Family history of anesthesia complication    brother & neice "flat lined" during induction: neice d/t a medication given for a blood clotting problem; brother d/t "miscalculated amount of anesthesia"  . Fibromyalgia   . GERD (gastroesophageal reflux disease)    h/o colitis   . H. pylori infection    2012- stomach biopsy  . H/O hiatal hernia   . Headache(784.0)    using tylenol prn  . Hyperlipidemia   . Hypertension   . Hypoglycemia   . Normal cardiac stress test    pt. released fr. Dr. [name] care in 10/2011, all findings w/i normal limits  . Overweight(278.02)  Obesity  . Palpitations   . Sinusitis, acute    seen by PCP- 06/17/2012, will treat /w augmentin & tussin/DM  . Uterine fibroid    pt. states that she has a "closed cervix"     Past Surgical History:  Procedure Laterality Date  . COLONOSCOPY    . CRANIOTOMY Right 06/24/2012   Procedure: CRANIOTOMY INTRACRANIAL ANEURYSM FOR CAROTID;  Surgeon: [name], MD;  Location: [facility];  Service: Neurosurgery;  Laterality: Right;  RIGHT Pterional Craniotomy for aneurysm  . LIPOMA EXCISION     buttock 10/2010     Social History:  The patient  reports that she quit smoking about 13 years ago. Her smoking use included cigarettes. She started smoking about 28 years ago. She has a 12.00 pack-year smoking history. She has never used smokeless tobacco. She reports that she does not drink alcohol or use drugs.   Family History:  The patient's family  history includes Coronary artery disease in her other; Dementia in her maternal aunt and mother; Hypertension in her other.    ROS:  Please see the history of present illness. All other systems are reviewed and  Negative to the above problem except as noted.    PHYSICAL EXAM: VS:  BP 124/80   Pulse 68   Ht 5\' 2"  (1.575 m)   Wt 196 lb (88.9 kg)   LMP 08/25/2012   SpO2 99% Comment: on room air  BMI 35.85 kg/m   GEN: Morbidly obese 57 yo  in no acute distress  HEENT: normal  Neck: no JVD, carotid bruits, or masses Cardiac: RRR; no murmurs, rubs, or gallops,no edema  Respiratory:  clear to auscultation bilaterally, normal work of breathing GI: soft, nontender, nondistended, + BS  No hepatomegaly  MS: no deformity Moving all extremities   Skin: warm and dry, no rash Neuro:  Strength and sensation are intact Psych: euthymic mood, full affect   EKG:  EKG is ordered today.  SB 58 bpm      Lipid Panel    Component Value Date/Time   CHOL 193 12/19/2015 1142   TRIG 97 12/19/2015 1142   HDL 54 12/19/2015 1142   CHOLHDL 3.6 12/19/2015 1142   CHOLHDL 3.3 04/03/2014 0518   VLDL 10 04/03/2014 0518   LDLCALC 120 (H) 12/19/2015 1142      Wt Readings from Last 3 Encounters:  08/11/17 196 lb (88.9 kg)  06/04/17 199 lb (90.3 kg)  03/16/17 199 lb (90.3 kg)      ASSESSMENT AND PLAN:  1  Chest pain  Atypical  Some of pain is musculosckel  She is very tender to touch   Patient says some of discomfort  is deeper   ? GI  She does have a history of  reflux    I am not convinced pt's symptmos represent ischemia  Reassured her   Would follow Recomm she increase activity   2  palpitaitons None sound hemodyn destaabilitzing   She has worn monitors in past  Nothing detected   I would recomm cutting back on Dilt to 180 mg per day  Increase metoprolol to 50 bid then poss 75/50 daily   She may respond better to more b blockade   F/U with [name]   3  HTN  BP is good     4  Hx GERD  Contnue  PPI    I encouraged pt to exercise (walk)  Try to get wt down    Current  medicines are reviewed at length with the patient today.  The patient does not have concerns regarding medicines.  Signed, Dorris Carnes, MD  08/11/2017 1:43 PM    [facility] [place], [place], [place]  [address] Phone: [phone]; Fax: [phone]

## 2017-08-11 NOTE — Patient Instructions (Signed)
Medication Instructions:  Decrease diltiazem to 180 mg ONCE DAILY   INCREASE METOPROLOL TO 50 MG - TWO TIMES DAILY   Labwork: NONE  Testing/Procedures: NONE  Follow-Up: Your physician recommends that you schedule a follow-up appointment in: 1 MONTH    Any Other Special Instructions Will Be Listed Below (If Applicable).     If you need a refill on your cardiac medications before your next appointment, please call your pharmacy.

## 2017-09-10 ENCOUNTER — Emergency Department (HOSPITAL_COMMUNITY): Payer: BLUE CROSS/BLUE SHIELD

## 2017-09-10 ENCOUNTER — Encounter (HOSPITAL_COMMUNITY): Payer: Self-pay

## 2017-09-10 ENCOUNTER — Other Ambulatory Visit: Payer: Self-pay

## 2017-09-10 ENCOUNTER — Emergency Department (HOSPITAL_COMMUNITY)
Admission: EM | Admit: 2017-09-10 | Discharge: 2017-09-10 | Disposition: A | Discharge status: Home / Self Care | Payer: BLUE CROSS/BLUE SHIELD | Attending: Emergency Medicine | Admitting: Emergency Medicine

## 2017-09-10 DIAGNOSIS — R079 Chest pain, unspecified: Secondary | ICD-10-CM | POA: Diagnosis not present

## 2017-09-10 DIAGNOSIS — Z7982 Long term (current) use of aspirin: Secondary | ICD-10-CM | POA: Diagnosis not present

## 2017-09-10 DIAGNOSIS — I10 Essential (primary) hypertension: Secondary | ICD-10-CM | POA: Insufficient documentation

## 2017-09-10 DIAGNOSIS — R51 Headache: Secondary | ICD-10-CM | POA: Diagnosis not present

## 2017-09-10 DIAGNOSIS — R0789 Other chest pain: Secondary | ICD-10-CM | POA: Insufficient documentation

## 2017-09-10 DIAGNOSIS — Z87891 Personal history of nicotine dependence: Secondary | ICD-10-CM | POA: Insufficient documentation

## 2017-09-10 DIAGNOSIS — Z79899 Other long term (current) drug therapy: Secondary | ICD-10-CM | POA: Diagnosis not present

## 2017-09-10 DIAGNOSIS — R05 Cough: Secondary | ICD-10-CM | POA: Diagnosis not present

## 2017-09-10 LAB — TROPONIN I: Troponin I: <0.03 ng/mL (ref <0.03)

## 2017-09-10 LAB — BASIC METABOLIC PANEL
ANION GAP: 8 (ref 5–15)
BUN: 18 mg/dL (ref 6–20)
CALCIUM: 9.6 mg/dL (ref 8.9–10.3)
CHLORIDE: 103 mmol/L (ref 101–111)
CO2: 28 mmol/L (ref 22–32)
CREATININE: 1.08 mg/dL — AB (ref 0.44–1.00)
GFR calc non Af Amer: 56 mL/min — ABNORMAL LOW (ref >60)
Glucose, Bld: 91 mg/dL (ref 65–99)
Potassium: 3.6 mmol/L (ref 3.5–5.1)
Sodium: 139 mmol/L (ref 135–145)

## 2017-09-10 LAB — CBC
HCT: 39.2 % (ref 36.0–46.0)
HEMOGLOBIN: 12.9 g/dL (ref 12.0–15.0)
MCH: 29.5 pg (ref 26.0–34.0)
MCHC: 32.9 g/dL (ref 30.0–36.0)
MCV: 89.5 fL (ref 78.0–100.0)
PLATELETS: 341 10*3/uL (ref 150–400)
RBC: 4.38 MIL/uL (ref 3.87–5.11)
RDW: 13.1 % (ref 11.5–15.5)
WBC: 7.1 10*3/uL (ref 4.0–10.5)

## 2017-09-10 LAB — DIFFERENTIAL
Basophils Absolute: 0 10*3/uL (ref 0.0–0.1)
Basophils Relative: 0 %
EOS PCT: 2 %
Eosinophils Absolute: 0.1 10*3/uL (ref 0.0–0.7)
LYMPHS ABS: 1.9 10*3/uL (ref 0.7–4.0)
LYMPHS PCT: 27 %
Monocytes Absolute: 0.3 10*3/uL (ref 0.1–1.0)
Monocytes Relative: 4 %
NEUTROS ABS: 4.7 10*3/uL (ref 1.7–7.7)
Neutrophils Relative %: 67 %

## 2017-09-10 LAB — HEPATIC FUNCTION PANEL
ALBUMIN: 3.8 g/dL (ref 3.5–5.0)
ALK PHOS: 59 U/L (ref 38–126)
ALT: 11 U/L — AB (ref 14–54)
AST: 18 U/L (ref 15–41)
Bilirubin, Direct: 0.1 mg/dL (ref 0.1–0.5)
Indirect Bilirubin: 0.6 mg/dL (ref 0.3–0.9)
TOTAL PROTEIN: 7.5 g/dL (ref 6.5–8.1)
Total Bilirubin: 0.7 mg/dL (ref 0.3–1.2)

## 2017-09-10 MED ORDER — IOPAMIDOL (ISOVUE-370) INJECTION 76%
75.0000 mL | Freq: Once | INTRAVENOUS | Status: AC | PRN
  Administered 2017-09-10: 75 mL via INTRAVENOUS

## 2017-09-10 NOTE — Discharge Instructions (Addendum)
Take Tylenol for discomfort.  Decrease your Lopressor to 25 mg twice a day.  Follow-up with your doctor next week

## 2017-09-10 NOTE — ED Provider Notes (Signed)
[facility] EMERGENCY DEPARTMENT Provider Note   CSN: [id number] Arrival date & time: 09/10/17  6546     History   Chief Complaint Chief Complaint  Patient presents with  . Chest Pain    HPI [name] [name] is a 57 y.o. female.  Patient complains of left-sided chest wall pain worse with movement and cough.  The history is provided by the patient. No language interpreter was used.  Chest Pain   This is a new problem. The current episode started 6 to 12 hours ago. The problem occurs constantly. The problem has not changed since onset.The pain is associated with movement. The pain is present in the lateral region. The pain is at a severity of 4/10. The pain is moderate. The quality of the pain is described as dull. The pain does not radiate. Pertinent negatives include no abdominal pain, no back pain, no cough and no headaches.  Pertinent negatives for past medical history include no seizures.    Past Medical History:  Diagnosis Date  . Aneurysm ([place])    brain  . Chest pain    denied on 06/18/2012  . Chronic kidney disease    L - upper pole- defect, doesn't effect     . Family history of anesthesia complication    brother & neice "flat lined" during induction: neice d/t a medication given for a blood clotting problem; brother d/t "miscalculated amount of anesthesia"  . Fibromyalgia   . GERD (gastroesophageal reflux disease)    h/o colitis   . H. pylori infection    2012- stomach biopsy  . H/O hiatal hernia   . Headache(784.0)    using tylenol prn  . Hyperlipidemia   . Hypertension   . Hypoglycemia   . Normal cardiac stress test    pt. released fr. Dr. [name] care in 10/2011, all findings w/i normal limits  . Overweight(278.02)    Obesity  . Palpitations   . Sinusitis, acute    seen by PCP- 06/17/2012, will treat /w augmentin & tussin/DM  . Uterine fibroid    pt. states that she has a "closed cervix"     Patient Active Problem List   Diagnosis Date Noted  .  Neck pain 12/02/2016  . Bilateral occipital neuralgia 12/02/2016  . Cervical neuralgia 03/25/2016  . Tension headache 03/25/2016  . Chest tightness or pressure 04/03/2014  . Dizziness 04/03/2014  . Headache 04/03/2014  . Chest pain 04/02/2014  . GERD (gastroesophageal reflux disease) 04/02/2014  . Meningitis, unspecified(322.9) 08/11/2012  . 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 HLD (hyperlipidemia) 01/05/2009  . OVERWEIGHT/OBESITY 01/05/2009  . Essential hypertension 01/05/2009  . Palpitations 01/05/2009  . CHEST PAIN-UNSPECIFIED 01/05/2009    Past Surgical History:  Procedure Laterality Date  . COLONOSCOPY    . CRANIOTOMY Right 06/24/2012   Procedure: CRANIOTOMY INTRACRANIAL ANEURYSM FOR CAROTID;  Surgeon: [name], MD;  Location: [facility];  Service: Neurosurgery;  Laterality: Right;  RIGHT Pterional Craniotomy for aneurysm  . LIPOMA EXCISION     buttock 10/2010     OB History   None      Home Medications    Prior to Admission medications   Medication Sig Start Date End Date Taking? Authorizing Provider  acetaminophen (TYLENOL) 650 MG CR tablet Take 1,300 mg by mouth every 8 (eight) hours as needed for pain or fever.    Yes [provider]  albuterol (PROAIR HFA) 108 (90 Base) MCG/ACT inhaler INHALE 2 PUFF EVERY 4 HOURS AS NEEDED  06/06/15  Yes [provider]  aspirin EC 81 MG tablet Take 81 mg by mouth daily.    Yes [provider]  chlorthalidone (HYGROTON) 25 MG tablet Take 0.5 tablets (12.5 mg total) by mouth daily. 02/03/17 09/10/17 Yes Herminio Commons, MD  Cholecalciferol (D 1000) 1000 UNITS tablet Take 1,000 Units by mouth every evening.    Yes [provider]  cyclobenzaprine (FLEXERIL) 5 MG tablet Take 1 tablet (5 mg total) by mouth every 8 (eight) hours as needed for muscle spasms. 06/04/17  Yes Dennie Bible, NP  diltiazem (CARDIZEM CD) 180 MG 24 hr capsule Take 1 capsule (180 mg total) by mouth daily.  08/11/17  Yes Fay Records, MD  loratadine (CLARITIN) 10 MG tablet Take 10 mg by mouth daily.    Yes [provider]  metoprolol tartrate (LOPRESSOR) 50 MG tablet Take 1 tablet (50 mg total) by mouth 2 (two) times daily. 08/11/17 11/09/17 Yes Fay Records, MD  pantoprazole (PROTONIX) 40 MG tablet Take 40 mg by mouth daily. 03/04/14  Yes [provider]    Family History Family History  Problem Relation Age of Onset  . Hypertension Other   . Coronary artery disease Other   . Dementia Mother   . Dementia Maternal Aunt     Social History Social History   Tobacco Use  . Smoking status: Former Smoker    Packs/day: 0.80    Years: 15.00    Pack years: 12.00    Types: Cigarettes    Start date: 02/13/1989    Last attempt to quit: 04/28/2004    Years since quitting: 13.3  . Smokeless tobacco: Never Used  Substance Use Topics  . Alcohol use: No    Alcohol/week: 0.0 oz  . Drug use: No     Allergies   Ace inhibitors; Azithromycin; Ciprofloxacin; Other; Statins; Medrol [methylprednisolone]; and Tetracyclines & related   Review of Systems Review of Systems  Constitutional: Negative for appetite change and fatigue.  HENT: Negative for congestion, ear discharge and sinus pressure.   Eyes: Negative for discharge.  Respiratory: Negative for cough.   Cardiovascular: Positive for chest pain.  Gastrointestinal: Negative for abdominal pain and diarrhea.  Genitourinary: Negative for frequency and hematuria.  Musculoskeletal: Negative for back pain.  Skin: Negative for rash.  Neurological: Negative for seizures and headaches.       Numbness to left arm and left leg  Psychiatric/Behavioral: Negative for hallucinations.     Physical Exam Updated Vital Signs BP (!) 113/52   Pulse (!) 51   Temp 98.1 F (36.7 C) (Oral)   Resp 12   Ht 5\' 2"  (1.575 m)   Wt 88.5 kg (195 lb)   LMP 08/25/2012   SpO2 100%   BMI 35.67 kg/m   Physical Exam  Constitutional: She is  oriented to person, place, and time. She appears well-developed.  HENT:  Head: Normocephalic.  Eyes: Conjunctivae and EOM are normal. No scleral icterus.  Neck: Neck supple. No thyromegaly present.  Cardiovascular: Normal rate and regular rhythm. Exam reveals no gallop and no friction rub.  No murmur heard. Pulmonary/Chest: No stridor. She has no wheezes. She has no rales. She exhibits tenderness.  Abdominal: She exhibits no distension. There is no tenderness. There is no rebound.  Musculoskeletal: Normal range of motion. She exhibits no edema.  Lymphadenopathy:    She has no cervical adenopathy.  Neurological: She is oriented to person, place, and time. She exhibits normal muscle tone.  Coordination normal.  Skin: No rash noted. No erythema.  Psychiatric: She has a normal mood and affect. Her behavior is normal.     ED Treatments / Results  Labs (all labs ordered are listed, but only abnormal results are displayed) Labs Reviewed  BASIC METABOLIC PANEL - Abnormal; Notable for the following components:      Result Value   Creatinine, Ser 1.08 (*)    GFR calc non Af Amer 56 (*)    All other components within normal limits  HEPATIC FUNCTION PANEL - Abnormal; Notable for the following components:   ALT 11 (*)    All other components within normal limits  CBC  TROPONIN I  DIFFERENTIAL  TROPONIN I    EKG EKG Interpretation  Date/Time:  Wednesday Sep 10 2017 09:22:01 EDT Ventricular Rate:  56 PR Interval:    QRS Duration: 91 QT Interval:  447 QTC Calculation: 432 R Axis:   15 Text Interpretation:  Sinus rhythm Low voltage, precordial leads Baseline wander in lead(s) V2 Confirmed by Milton Ferguson [phone]) on 09/10/2017 3:04:26 PM   Radiology Ct Angio Head W Or Wo Contrast  Result Date: 09/10/2017 CLINICAL DATA:  Intermittent left-sided headache over the last 3 months. History of brain aneurysm. EXAM: CT ANGIOGRAPHY HEAD TECHNIQUE: Multidetector CT imaging of the head was  performed using the standard protocol during bolus administration of intravenous contrast. Multiplanar CT image reconstructions and MIPs were obtained to evaluate the vascular anatomy. CONTRAST:  42mL ISOVUE-370 IOPAMIDOL (ISOVUE-370) INJECTION 76% COMPARISON:  11/20/2016. FINDINGS: CT HEAD Brain: Previous right pterional craniotomy for aneurysm clipping in the right MCA region. 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 No sign of acute infarction, mass lesion, hemorrhage, hydrocephalus or extra-axial collection. Vascular: Otherwise negative.  See below. Skull: Otherwise negative. Sinuses: Clear Orbits: Negative CTA HEAD Anterior circulation: Both internal carotid arteries are widely patent through the skull base and siphon regions. The anterior and middle cerebral vessels are patent without proximal stenosis, aneurysm or vascular malformation. No evidence recurrent or recanalized flow in the right MCA region. Left posterior communicating infundibulum is unchanged and not significant. Posterior circulation: Both vertebral arteries are widely patent to the basilar. No basilar stenosis. Posterior circulation branch vessels are normal. No posterior circulation aneurysm. Venous sinuses: Patent and normal. Anatomic variants: None significant. Delayed phase: Abnormal enhancement. IMPRESSION: Head CT: No acute finding. Previous pterional craniotomy for clipping of right MCA region aneurysm. 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 Intracranial CT angiography: Normal appearance. No evidence of recanalized flow in the right MCA region. No second aneurysm identified. Electronically Signed   By: Nelson Chimes M.D.   On: 09/10/2017 12:03   Dg Chest 2 View  Result Date: 09/10/2017 CLINICAL DATA:  57 year old female with left side chest pain for several days. Cough and congestion for several weeks. Former smoker. EXAM: CHEST - 2 VIEW COMPARISON:  [facility] 04/30/2017 chest radiographs and  earlier. FINDINGS: Mediastinal contours remain normal. Lung volumes are stable and within normal limits. Visualized tracheal air column is within normal limits. No pneumothorax, pulmonary edema, pleural effusion or confluent pulmonary opacity. No acute osseous abnormality identified. Negative visible bowel gas pattern. IMPRESSION: Negative.  No acute cardiopulmonary abnormality. Electronically Signed   By: Genevie Ann M.D.   On: 09/10/2017 10:17    Procedures Procedures (including critical care time)  Medications Ordered in ED Medications  iopamidol (ISOVUE-370) 76 % injection 75 mL (75 mLs Intravenous Contrast Given 09/10/17 1100)  Initial Impression / Assessment and Plan / ED Course  I have reviewed the triage vital signs and the nursing notes.  Pertinent labs & imaging results that were available during my care of the patient were reviewed by me and considered in my medical decision making (see chart for details).     Patient with left-sided chest pain.  CBC chemistries troponin x2 EKG chest x-ray all unremarkable.  Patient also has been having some numbness on her left arm and left leg and has a history of a aneurysm that has been clipped.  CT Angie of the head is unremarkable.  Suspect chest discomfort is chest wall pain.  Patient will take Tylenol for that.  Patient also complains that her heart rates been running in the 40s so her low pressures can be decreased to 25 mg twice a day.  Doubt coronary artery causing chest discomfort.  Final Clinical Impressions(s) / ED Diagnoses   Final diagnoses:  Atypical chest pain    ED Discharge Orders    None       Milton Ferguson, MD 09/10/17 1520

## 2017-09-10 NOTE — ED Triage Notes (Addendum)
Pt reports that she woke up with left sided chest pain that radiated to back. Pain is better, but still aches. Reports radation in hand and toes on left side. Pt metoprol 50 mg, chlorthiladone, 12.5 and cardizem 180. Pt reports she is followed by Dr Harl Bowie and medications have been changed recently

## 2017-09-23 ENCOUNTER — Ambulatory Visit: Payer: BLUE CROSS/BLUE SHIELD | Admitting: Cardiology

## 2017-09-23 ENCOUNTER — Encounter: Payer: Self-pay | Admitting: Cardiology

## 2017-09-23 VITALS — BP 127/80 | HR 66 | Ht 62.0 in | Wt 201.6 lb

## 2017-09-23 DIAGNOSIS — R002 Palpitations: Secondary | ICD-10-CM | POA: Diagnosis not present

## 2017-09-23 DIAGNOSIS — I10 Essential (primary) hypertension: Secondary | ICD-10-CM | POA: Diagnosis not present

## 2017-09-23 DIAGNOSIS — R0789 Other chest pain: Secondary | ICD-10-CM

## 2017-09-23 NOTE — Patient Instructions (Signed)

## 2017-09-23 NOTE — Progress Notes (Signed)
Clinical Summary [name] [name] is a 57 y.o.female  seen today for follow up of the following medical problems.   1. Chest pain - long history of atypical chest pain - she completed a lexiscan that was overall low risk, small apical defect probable artifact. - [facility] at [facility] 10/17 with chest pain. CT PE negative.  07/2015 nuclear probable apical thinning, cannot exlcude mild apical ischemia    - seen in ER 08/2017 with chest pain, worst with movement and cough. No evidence of ischemia by EKG or enzymes  2. Palpitations - admit 03/2014 with palpitations and chest pain. On that day was hypertensive and tachycardic on admission. - MPI without evidence of ischemia, LVEF 78%. Echo "normal LVEF". TSH 1.4. Event monitor with occasional PACs, no significant arrhythmias   - holter 11/2016 no arrhythmias - no recent symptoms. She does report some low heart rates prevoiusly at home that have resolved.    3. HTN - hydralazine previously stopped due to concerns for possible drug induced lupus. Symptoms better.   - home bp's 120s/70, heart rates low 60s. At night increased to 140s/80s - her dilt was prevoiusly decreased, with this significant elvation in bp's and she went back to takeing 180mg  bid - has been on fairly high dose dual av nodal agents due to history of chronic palpitations, has tolerated regimen.     Past Medical History:  Diagnosis Date  . Aneurysm ([place])    brain  . Chest pain    denied on 06/18/2012  . Chronic kidney disease    L - upper pole- defect, doesn't effect     . Family history of anesthesia complication    brother & neice "flat lined" during induction: neice d/t a medication given for a blood clotting problem; brother d/t "miscalculated amount of anesthesia"  . Fibromyalgia   . GERD (gastroesophageal reflux disease)    h/o colitis   . H. pylori infection    2012- stomach biopsy  . H/O hiatal hernia   . Headache(784.0)    using tylenol prn    . Hyperlipidemia   . Hypertension   . Hypoglycemia   . Normal cardiac stress test    pt. released fr. Dr. [name] care in 10/2011, all findings w/i normal limits  . Overweight(278.02)    Obesity  . Palpitations   . Sinusitis, acute    seen by PCP- 06/17/2012, will treat /w augmentin & tussin/DM  . Uterine fibroid    pt. states that she has a "closed cervix"      Allergies  Allergen Reactions  . Ace Inhibitors Swelling  . Azithromycin Anaphylaxis    Breathing difficulty, swelling  . Ciprofloxacin Nausea And Vomiting    Also: kidney failure   . Other Swelling, Other (See Comments) and Cough    Sneezing allergy to cats/grass/dust  . Statins Other (See Comments)    Cramping, muscle pain  . Medrol [Methylprednisolone] Palpitations    Heart racing, increased BP  . Tetracyclines & Related Palpitations    Heart racing, increased bp     Current Outpatient Medications  Medication Sig Dispense Refill  . acetaminophen (TYLENOL) 650 MG CR tablet Take 1,300 mg by mouth every 8 (eight) hours as needed for pain or fever.     [name] albuterol (PROAIR HFA) 108 (90 Base) MCG/ACT inhaler INHALE 2 PUFF EVERY 4 HOURS AS NEEDED    . aspirin EC 81 MG tablet Take 81 mg by mouth daily.     [name]  chlorthalidone (HYGROTON) 25 MG tablet Take 0.5 tablets (12.5 mg total) by mouth daily. 30 tablet 3  . Cholecalciferol (D 1000) 1000 UNITS tablet Take 1,000 Units by mouth every evening.     . cyclobenzaprine (FLEXERIL) 5 MG tablet Take 1 tablet (5 mg total) by mouth every 8 (eight) hours as needed for muscle spasms. 30 tablet 6  . diltiazem (CARDIZEM CD) 180 MG 24 hr capsule Take 1 capsule (180 mg total) by mouth daily. 90 capsule 3  . loratadine (CLARITIN) 10 MG tablet Take 10 mg by mouth daily.     . metoprolol tartrate (LOPRESSOR) 50 MG tablet Take 1 tablet (50 mg total) by mouth 2 (two) times daily. 180 tablet 3  . pantoprazole (PROTONIX) 40 MG tablet Take 40 mg by mouth daily.  1   No current  facility-administered medications for this visit.      Past Surgical History:  Procedure Laterality Date  . COLONOSCOPY    . CRANIOTOMY Right 06/24/2012   Procedure: CRANIOTOMY INTRACRANIAL ANEURYSM FOR CAROTID;  Surgeon: [name], MD;  Location: [facility];  Service: Neurosurgery;  Laterality: Right;  RIGHT Pterional Craniotomy for aneurysm  . LIPOMA EXCISION     buttock 10/2010     Allergies  Allergen Reactions  . Ace Inhibitors Swelling  . Azithromycin Anaphylaxis    Breathing difficulty, swelling  . Ciprofloxacin Nausea And Vomiting    Also: kidney failure   . Other Swelling, Other (See Comments) and Cough    Sneezing allergy to cats/grass/dust  . Statins Other (See Comments)    Cramping, muscle pain  . Medrol [Methylprednisolone] Palpitations    Heart racing, increased BP  . Tetracyclines & Related Palpitations    Heart racing, increased bp      Family History  Problem Relation Age of Onset  . Hypertension Other   . Coronary artery disease Other   . Dementia Mother   . Dementia Maternal Aunt      Social History [name] [name] reports that she quit smoking about 13 years ago. Her smoking use included cigarettes. She started smoking about 28 years ago. She has a 12.00 pack-year smoking history. She has never used smokeless tobacco. [name] [name] reports that she does not drink alcohol.   Review of Systems CONSTITUTIONAL: No weight loss, fever, chills, weakness or fatigue.  HEENT: Eyes: No visual loss, blurred vision, double vision or yellow sclerae.No hearing loss, sneezing, congestion, runny nose or sore throat.  SKIN: No rash or itching.  CARDIOVASCULAR: per hpi RESPIRATORY: No shortness of breath, cough or sputum.  GASTROINTESTINAL: No anorexia, nausea, vomiting or diarrhea. No abdominal pain or blood.  GENITOURINARY: No burning on urination, no polyuria NEUROLOGICAL: No headache, dizziness, syncope, paralysis, ataxia, numbness or tingling in the  extremities. No change in bowel or bladder control.  MUSCULOSKELETAL: No muscle, back pain, joint pain or stiffness.  LYMPHATICS: No enlarged nodes. No history of splenectomy.  PSYCHIATRIC: No history of depression or anxiety.  ENDOCRINOLOGIC: No reports of sweating, cold or heat intolerance. No polyuria or polydipsia.  [name]   Physical Examination Vitals:   09/23/17 1444  BP: 127/80  Pulse: 66  SpO2: 96%   Vitals:   09/23/17 1444  Weight: 201 lb 9.6 oz (91.4 kg)  Height: 5\' 2"  (1.575 m)    Gen: resting comfortably, no acute distress HEENT: no scleral icterus, pupils equal round and reactive, no palptable cervical adenopathy,  CV: RRR, 2/6 systolic murmur rusb, no jvd Resp: Clear to auscultation  bilaterally GI: abdomen is soft, non-tender, non-distended, normal bowel sounds, no hepatosplenomegaly MSK: extremities are warm, no edema.  Skin: warm, no rash Neuro:  no focal deficits Psych: appropriate affect   Diagnostic Studies  05/2009 Nuclear stress No ischemia  03/2014 Echo Study Conclusions  - Left ventricle: The cavity size was normal. Systolic function was normal. Wall motion was normal; there were no regional wall motion abnormalities.  03/2014 Lexiscan MPI IMPRESSION: 1. No reversible ischemia or infarction.  2. Normal left ventricular wall motion.  3. Left ventricular ejection fraction 78%  4. Low-risk stress test findings*.  03/2014 Event Monitor Symptoms correlate with NSR. One episode of fluttering showed 2 PACs. No significant arrhythmias  07/2015 Lexiscan MPI  There was no ST segment deviation noted during stress.  This is a low risk study.  The left ventricular ejection fraction is mildly decreased (45-54%). LVEF is 49% just slightly below normal, visually appears normal. Consider correlating with echo.  There is a small mild intensity distal anterior and apical defect with mild reversibility. The apex has normal wall motion. Finding  is likely secondary to apical thinning, there is also significant radiotracer uptake in the adjacent gut which may create some artifact. Cannot rule would mild apical ischemia. Overall findings are low risk.    11/2016 holter  Min HR 42, Max HR 94, Avg HR 57  No supraventricular or ventricular ectopy  Telemetry tracings show sinus bradycardia and sinus rhythm  No symptoms reported  No significant arrhythmias    Assessment and Plan   1. Chest pain - chronic atypical symptoms, ischemic testing over the years has been negative I - chronic stable symptoms, continue to monitor at this time.   2. Palpitations - recent negative holter. - she will continue current meds, symptoms fairly well contorlled.   3. HTN - at goal, continue currnent meds  F/u 6 months      Arnoldo Lenis, M.D.

## 2017-09-27 ENCOUNTER — Encounter: Payer: Self-pay | Admitting: Cardiology

## 2017-10-13 ENCOUNTER — Telehealth: Payer: Self-pay | Admitting: Nurse Practitioner

## 2017-10-13 NOTE — Telephone Encounter (Signed)
Pt requesting a call, stating she had a CT down last month(not ordered from our Dr or NP) but would like to discuss the finding if possible. Please call to advise

## 2017-10-14 NOTE — Telephone Encounter (Signed)
I spoke to [name] [name] and she is asking about results from a recent CTA that she had done for headaches and memory loss, tingling hands.  Her pcp told her to go to ED.  She had CTA done.  (results showed old infarct).  She is asking if this was showing on her previous CTA.  (she stated that Dr. Erlinda Hong went over this with her when she was in the office 2018 done at [facility]  Dr. Sherren Mocha out and CM out.  I told her would be glad to send message and touch base next week.  She could speak to NS, Dr. Christella Noa or pcp acutely.  She was ok to wait.  CM last swa for headaches.

## 2017-10-16 DIAGNOSIS — I10 Essential (primary) hypertension: Secondary | ICD-10-CM | POA: Diagnosis not present

## 2017-10-16 DIAGNOSIS — E782 Mixed hyperlipidemia: Secondary | ICD-10-CM | POA: Diagnosis not present

## 2017-10-16 DIAGNOSIS — Z9189 Other specified personal risk factors, not elsewhere classified: Secondary | ICD-10-CM | POA: Diagnosis not present

## 2017-10-16 DIAGNOSIS — R5383 Other fatigue: Secondary | ICD-10-CM | POA: Diagnosis not present

## 2017-10-20 DIAGNOSIS — E782 Mixed hyperlipidemia: Secondary | ICD-10-CM | POA: Diagnosis not present

## 2017-10-20 DIAGNOSIS — I10 Essential (primary) hypertension: Secondary | ICD-10-CM | POA: Diagnosis not present

## 2017-10-20 DIAGNOSIS — Z1331 Encounter for screening for depression: Secondary | ICD-10-CM | POA: Diagnosis not present

## 2017-10-20 DIAGNOSIS — Z1389 Encounter for screening for other disorder: Secondary | ICD-10-CM | POA: Diagnosis not present

## 2017-10-20 DIAGNOSIS — I671 Cerebral aneurysm, nonruptured: Secondary | ICD-10-CM | POA: Diagnosis not present

## 2017-10-20 DIAGNOSIS — J4520 Mild intermittent asthma, uncomplicated: Secondary | ICD-10-CM | POA: Diagnosis not present

## 2017-10-20 NOTE — Telephone Encounter (Signed)
Head CT: No acute finding. Previous pterional craniotomy for clipping of right MCA region aneurysm. 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  Intracranial CT angiography: Normal appearance. No evidence of recanalized flow in the right MCA region. No second aneurysm Identified. Please call the patient The above is stable

## 2017-10-21 NOTE — Telephone Encounter (Signed)
I spoke to pt and she is concerned about her having had a stroke (chronic infarc) noted on her CTA that she had done back in 08/2017 [facility].  I relayed that I did not have access to other report done back in 2018 by Dr. Christella Noa, to see if this was a finding as well.   Dr Erlinda Hong not at this office anymore, Dr. Leonie Man is not available.  I would consult Dr. Leta Baptist about scan (and he was able to find in canopy system [facility]) believes that is normal variant and not stroke.  Can make earlier appt to discuss stroke risk factors. (taking aspirin, diet, exercise).  I attempted to call pt and was not able to LM (VM full).

## 2017-10-22 NOTE — Telephone Encounter (Signed)
I called pt and was not able to LM (home/mobile)  for her on her home number.

## 2017-10-22 NOTE — Telephone Encounter (Signed)
If she has CD she can make appt with Dr Leonie Man to review

## 2017-10-23 NOTE — Telephone Encounter (Signed)
I attempted to call pt and was not able to LM as VM full.

## 2017-10-24 ENCOUNTER — Encounter: Payer: Self-pay | Admitting: *Deleted

## 2017-10-24 NOTE — Telephone Encounter (Signed)
I sent letter thru mychart as not being able to reach her after 3 times.  (relating to MRI).

## 2017-11-03 ENCOUNTER — Encounter (INDEPENDENT_AMBULATORY_CARE_PROVIDER_SITE_OTHER): Payer: Self-pay | Admitting: Internal Medicine

## 2017-11-03 ENCOUNTER — Ambulatory Visit (INDEPENDENT_AMBULATORY_CARE_PROVIDER_SITE_OTHER): Payer: BLUE CROSS/BLUE SHIELD | Admitting: Internal Medicine

## 2017-11-18 DIAGNOSIS — I671 Cerebral aneurysm, nonruptured: Secondary | ICD-10-CM | POA: Diagnosis not present

## 2017-11-18 DIAGNOSIS — I10 Essential (primary) hypertension: Secondary | ICD-10-CM | POA: Diagnosis not present

## 2017-11-18 DIAGNOSIS — Z6839 Body mass index (BMI) 39.0-39.9, adult: Secondary | ICD-10-CM | POA: Diagnosis not present

## 2017-12-08 ENCOUNTER — Encounter: Payer: Self-pay | Admitting: Nurse Practitioner

## 2018-01-13 IMAGING — DX DG CHEST 2V
2 series · 2 of 2 positions shown · non-contrast
Comparison: 08/05/2015

CLINICAL DATA: Pt reports left lower back pain for two weeks, was
seen by doctor and told she had strained a muscle and denies injury.
Pt does mention that urine has strong odor. Pt reports she has a
"defective left kidney." Pt states she was born with defect.

EXAM:
CHEST  2 VIEW

[chest pa]
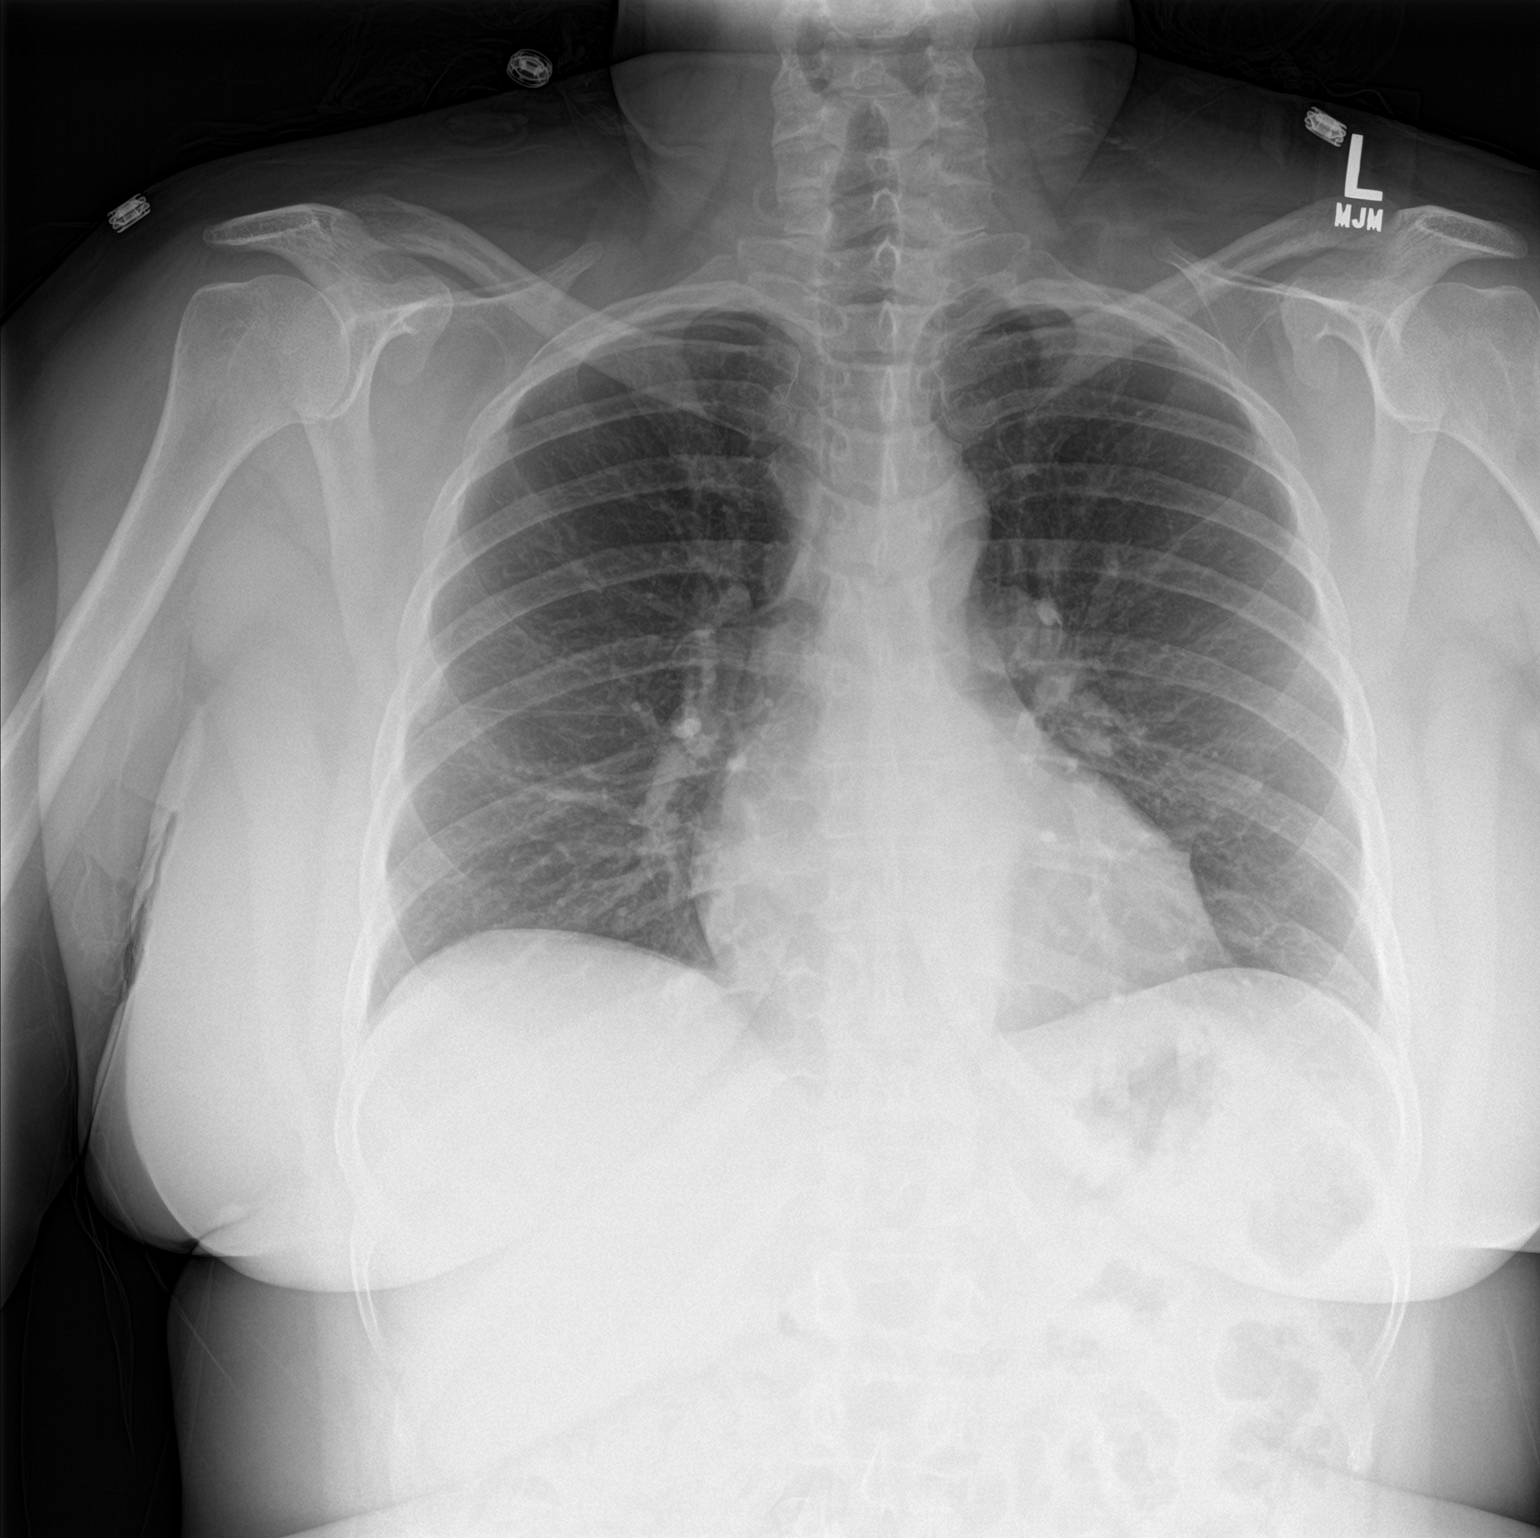

[chest lat]
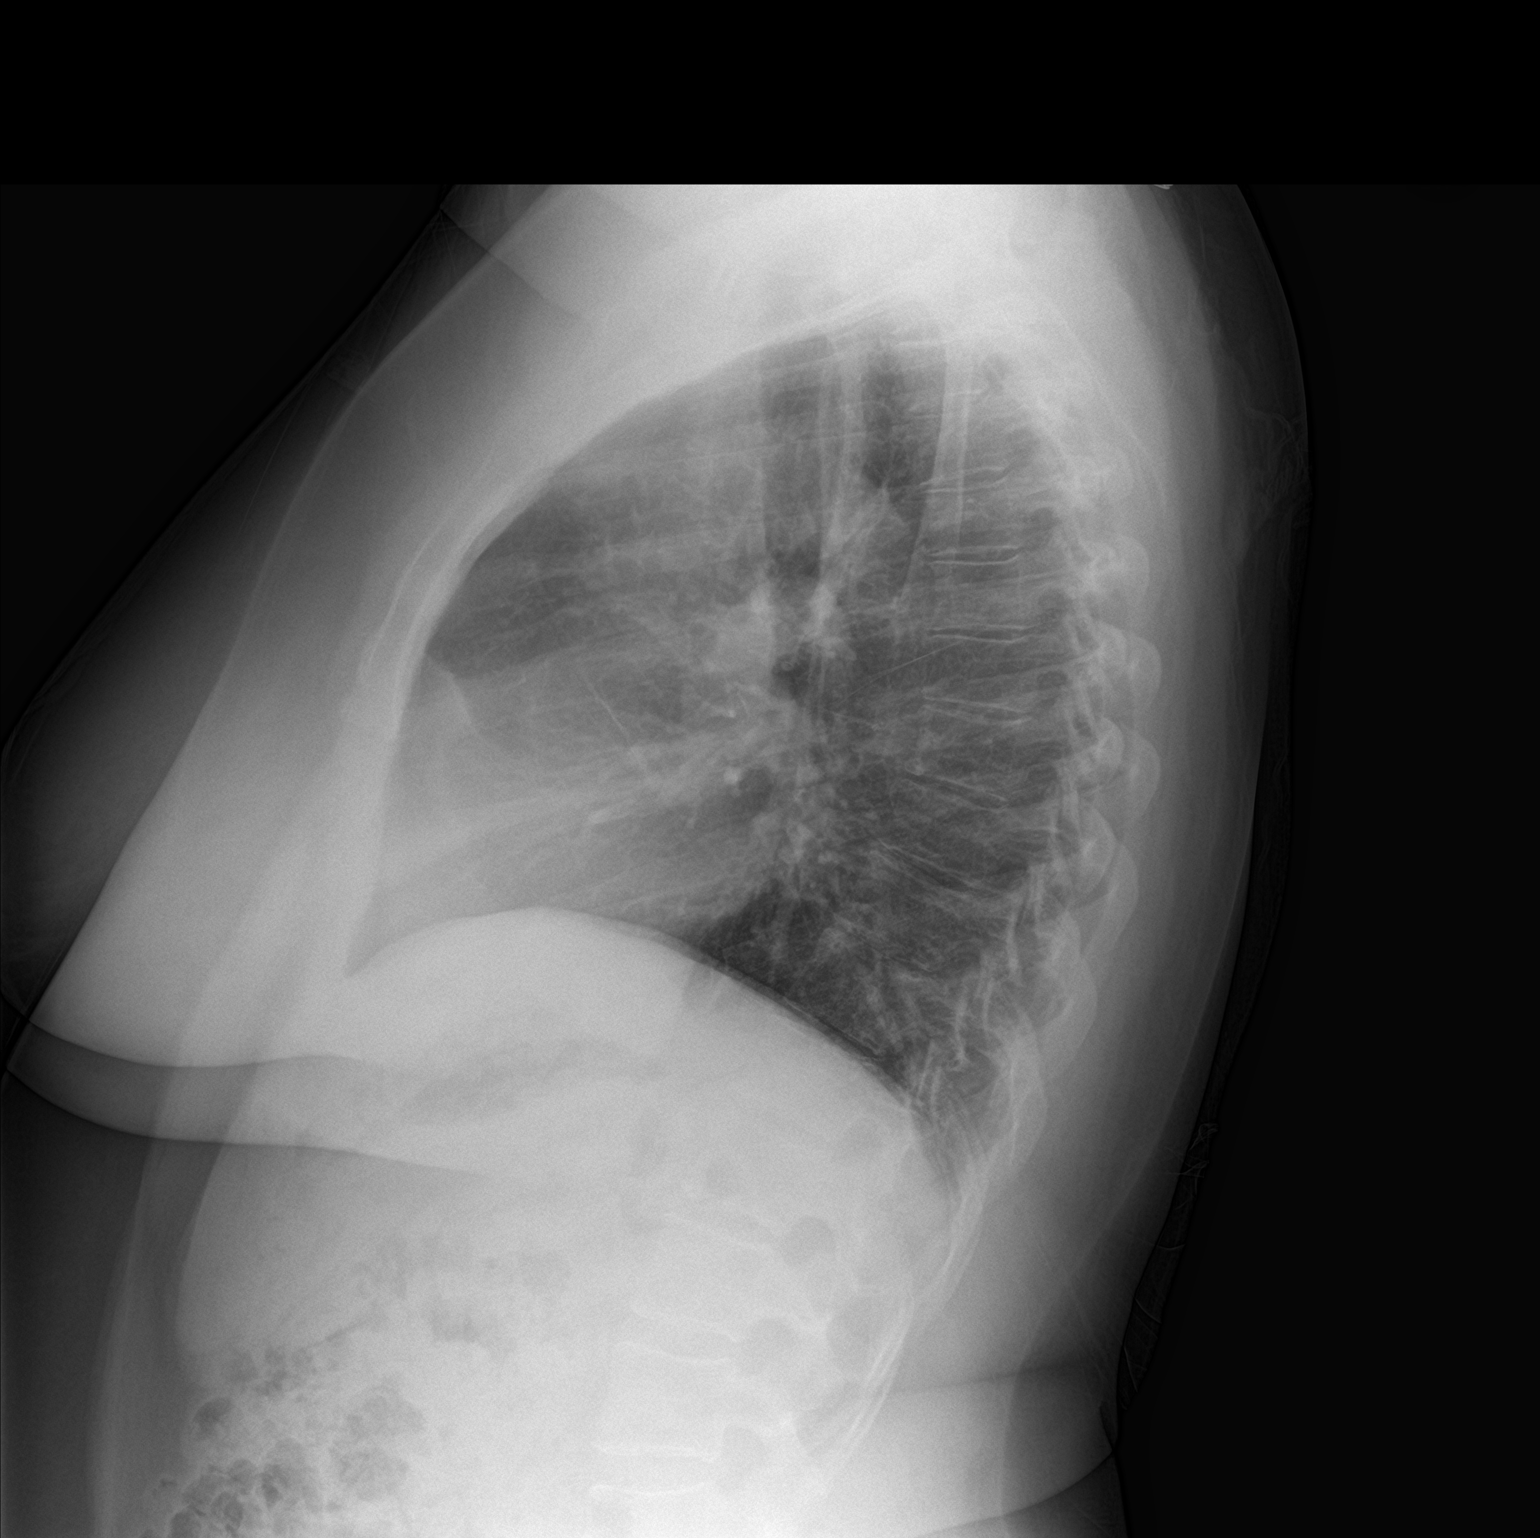

[2 of 2 positions shown; findings below may reference images not displayed]

FINDINGS: The heart size and mediastinal contours are within normal limits.
Both lungs are clear. The visualized skeletal structures are
unremarkable.
IMPRESSION: No active cardiopulmonary disease.

## 2018-01-30 DIAGNOSIS — M940 Chondrocostal junction syndrome [Tietze]: Secondary | ICD-10-CM | POA: Diagnosis not present

## 2018-01-30 DIAGNOSIS — R0789 Other chest pain: Secondary | ICD-10-CM | POA: Diagnosis not present

## 2018-02-11 DIAGNOSIS — E782 Mixed hyperlipidemia: Secondary | ICD-10-CM | POA: Diagnosis not present

## 2018-02-11 DIAGNOSIS — G43909 Migraine, unspecified, not intractable, without status migrainosus: Secondary | ICD-10-CM | POA: Diagnosis not present

## 2018-02-11 DIAGNOSIS — I10 Essential (primary) hypertension: Secondary | ICD-10-CM | POA: Diagnosis not present

## 2018-02-11 DIAGNOSIS — I671 Cerebral aneurysm, nonruptured: Secondary | ICD-10-CM | POA: Diagnosis not present

## 2018-02-17 ENCOUNTER — Encounter: Payer: Self-pay | Admitting: Neurology

## 2018-02-27 DIAGNOSIS — L821 Other seborrheic keratosis: Secondary | ICD-10-CM | POA: Diagnosis not present

## 2018-02-27 DIAGNOSIS — Z6836 Body mass index (BMI) 36.0-36.9, adult: Secondary | ICD-10-CM | POA: Diagnosis not present

## 2018-03-10 DIAGNOSIS — Z1231 Encounter for screening mammogram for malignant neoplasm of breast: Secondary | ICD-10-CM | POA: Diagnosis not present

## 2018-03-12 DIAGNOSIS — Z6836 Body mass index (BMI) 36.0-36.9, adult: Secondary | ICD-10-CM | POA: Diagnosis not present

## 2018-03-12 DIAGNOSIS — Z0000 Encounter for general adult medical examination without abnormal findings: Secondary | ICD-10-CM | POA: Diagnosis not present

## 2018-03-18 DIAGNOSIS — J069 Acute upper respiratory infection, unspecified: Secondary | ICD-10-CM | POA: Diagnosis not present

## 2018-03-18 DIAGNOSIS — Z6836 Body mass index (BMI) 36.0-36.9, adult: Secondary | ICD-10-CM | POA: Diagnosis not present

## 2018-03-18 DIAGNOSIS — B9789 Other viral agents as the cause of diseases classified elsewhere: Secondary | ICD-10-CM | POA: Diagnosis not present

## 2018-03-18 DIAGNOSIS — R52 Pain, unspecified: Secondary | ICD-10-CM | POA: Diagnosis not present

## 2018-03-18 NOTE — Progress Notes (Signed)
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  History supplemented by prior neurologist and referring providers notes.  She began experiencing headaches with neck pain in 2013.  MRI and MRA of the head showed a 4 x 5 mm terminal right ICA aneurysm.  There was also symmetric mild signal along the bilateral corticospinal tracts, findings suggestive of possible ALS but also may have been normal signal.  Findings were also seen on a repeat MRI in 2018.  MRI of the cervical spine demonstrated minimal degenerative changes but no significant foraminal or spinal stenosis.  She underwent aneurysm clipping and headaches had resolved for several years.  Headaches returned in 2017.  She reports mild to moderate left sided pressure as well as areas of sore spots on her scalp.  Headaches occur about 3 times a week, usually at night and lasts 2 hours with Tylenol or until she falls asleep.  She also reports heart beat in her left ear.  She also reports episodes of dizziness.  It is not positional.  She describes it as a slow onset of pressure in the head and needs to sit down.  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  Current NSAIDS: Aspirin 81 mg daily Current analgesics: Tylenol Current triptans: None Current ergotamine: None Current anti-emetic: None Current muscle relaxants: Flexeril 5 mg Current anti-anxiolytic: None Current sleep aide:  None Current Antihypertensive medications: Cardizem, Lopressor Current Antidepressant medications: None Current Anticonvulsant medications: None Current anti-CGRP: None Current Vitamins/Herbal/Supplements: None Current Antihistamines/Decongestants:  Claritin Other therapy: None  Past NSAIDS: None Past analgesics: None Past abortive triptans: None Past abortive ergotamine: None Past muscle relaxants: Robaxin Past anti-emetic: Zofran ODT 8 mg Past antihypertensive medications: Lopressor Past antidepressant medications: None Past anticonvulsant medications: none Past anti-CGRP: None Past vitamins/Herbal/Supplements: None Past antihistamines/decongestants: None Other past therapies: None  Caffeine: Drinks decaf. Diet: Hydrates Exercise: Not routine Depression: No; Anxiety: Yes Other pain: Neck pain Sleep hygiene: Poor  PAST MEDICAL HISTORY: Past Medical History:  Diagnosis Date  . Aneurysm ([place])    brain  . Chest pain    denied on 06/18/2012  . Chronic kidney disease    L - upper pole- defect, doesn't effect     . Family history of anesthesia complication    brother & neice "flat lined" during induction: neice d/t a medication given for a blood clotting problem; brother d/t "miscalculated amount of anesthesia"  . Fibromyalgia   . GERD (gastroesophageal reflux disease)    h/o colitis   . H. pylori infection    2012- stomach biopsy  . H/O hiatal hernia   . Headache(784.0)    using tylenol prn  . Hyperlipidemia   . Hypertension   . Hypoglycemia   . Normal cardiac stress test    pt. released fr. Dr. [name] care in 10/2011, all findings w/i normal limits  . Overweight(278.02)    Obesity  . Palpitations   . Sinusitis, acute    seen by PCP- 06/17/2012, will treat /w augmentin & tussin/DM  .  Uterine fibroid    pt. states that she has a "closed cervix"     PAST SURGICAL HISTORY: Past Surgical History:  Procedure Laterality Date  . COLONOSCOPY    . CRANIOTOMY Right  06/24/2012   Procedure: CRANIOTOMY INTRACRANIAL ANEURYSM FOR CAROTID;  Surgeon: [name], MD;  Location: [place] [facility];  Service: Neurosurgery;  Laterality: Right;  RIGHT Pterional Craniotomy for aneurysm  . LIPOMA EXCISION     buttock 10/2010    MEDICATIONS: Current Outpatient Medications on File Prior to Visit  Medication Sig Dispense Refill  . acetaminophen (TYLENOL) 650 MG CR tablet Take 1,300 mg by mouth every 8 (eight) hours as needed for pain or fever.     [name] albuterol (PROAIR HFA) 108 (90 Base) MCG/ACT inhaler INHALE 2 PUFF EVERY 4 HOURS AS NEEDED    . aspirin EC 81 MG tablet Take 81 mg by mouth daily.     . chlorthalidone (HYGROTON) 25 MG tablet Take 0.5 tablets (12.5 mg total) by mouth daily. 30 tablet 3  . Cholecalciferol (D 1000) 1000 UNITS tablet Take 1,000 Units by mouth every evening.     . cyclobenzaprine (FLEXERIL) 5 MG tablet Take 1 tablet (5 mg total) by mouth every 8 (eight) hours as needed for muscle spasms. 30 tablet 6  . diltiazem (CARDIZEM CD) 180 MG 24 hr capsule Take 1 capsule (180 mg total) by mouth daily. (Patient taking differently: Take 180 mg by mouth 2 (two) times daily. ) 90 capsule 3  . loratadine (CLARITIN) 10 MG tablet Take 10 mg by mouth daily.     . metoprolol tartrate (LOPRESSOR) 50 MG tablet Take 1 tablet (50 mg total) by mouth 2 (two) times daily. (Patient taking differently: Take one tab by mouth every morning, 1/2 tab every evening, & the other 1/2 as needed) 180 tablet 3  . pantoprazole (PROTONIX) 40 MG tablet Take 40 mg by mouth daily.  1   No current facility-administered medications on file prior to visit.     ALLERGIES: Allergies  Allergen Reactions  . Ace Inhibitors Swelling  . Azithromycin Anaphylaxis    Breathing difficulty, swelling  . Ciprofloxacin Nausea And Vomiting    Also: kidney failure   . Other Swelling, Other (See Comments) and Cough    Sneezing allergy to cats/grass/dust  . Statins Other (See Comments)    Cramping,  muscle pain  . Medrol [Methylprednisolone] Palpitations    Heart racing, increased BP  . Tetracyclines & Related Palpitations    Heart racing, increased bp    FAMILY HISTORY: Family History  Problem Relation Age of Onset  . Hypertension Other   . Coronary artery disease Other   . Dementia Mother   . Dementia Maternal Aunt     SOCIAL HISTORY: Social History   Socioeconomic History  . Marital status: Single    Spouse name: Not on file  . Number of children: Not on file  . Years of education: Not on file  . Highest education level: Not on file  Occupational History  . Occupation: Full time    Employer: [employer]  . Financial resource strain: Not on file  . Food insecurity:    Worry: Not on file    Inability: Not on file  . Transportation needs:    Medical: Not on file    Non-medical: Not on file  Tobacco Use  . Smoking status: Former Smoker    Packs/day: 0.80    Years: 15.00  Pack years: 12.00    Types: Cigarettes    Start date: 02/13/1989    Last attempt to quit: 04/28/2004    Years since quitting: 13.8  . Smokeless tobacco: Never Used  Substance and Sexual Activity  . Alcohol use: No    Alcohol/week: 0.0 standard drinks  . Drug use: No  . Sexual activity: Never    Birth control/protection: None, Abstinence  Lifestyle  . Physical activity:    Days per week: Not on file    Minutes per session: Not on file  . Stress: Not on file  Relationships  . Social connections:    Talks on phone: Not on file    Gets together: Not on file    Attends religious service: Not on file    Active member of club or organization: Not on file    Attends meetings of clubs or organizations: Not on file    Relationship status: Not on file  . Intimate partner violence:    Fear of current or ex partner: Not on file    Emotionally abused: Not on file    Physically abused: Not on file    Forced sexual activity: Not on file  Other Topics Concern  . Not on  file  Social History Narrative   Single    REVIEW OF SYSTEMS: Constitutional: No fevers, chills, or sweats, no generalized fatigue, change in appetite Eyes: No visual changes, double vision, eye pain Ear, nose and throat: No hearing loss, ear pain, nasal congestion, sore throat Cardiovascular: No chest pain, palpitations Respiratory:  No shortness of breath at rest or with exertion, wheezes GastrointestinaI: No nausea, vomiting, diarrhea, abdominal pain, fecal incontinence Genitourinary:  No dysuria, urinary retention or frequency Musculoskeletal:  No neck pain, back pain Integumentary: No rash, pruritus, skin lesions Neurological: as above Psychiatric: No depression, insomnia, anxiety Endocrine: No palpitations, fatigue, diaphoresis, mood swings, change in appetite, change in weight, increased thirst Hematologic/Lymphatic:  No purpura, petechiae. Allergic/Immunologic: no itchy/runny eyes, nasal congestion, recent allergic reactions, rashes  PHYSICAL EXAM: Blood pressure 110/78, pulse 70, height 5' 2.5" (1.588 m), weight 200 lb (90.7 kg), last menstrual period 08/25/2012, SpO2 96 %. General: No acute distress.  Patient appears well-groomed.  Head:  Normocephalic/atraumatic Eyes:  fundi examined but not visualized Neck: supple, no paraspinal tenderness, full range of motion Back: No paraspinal tenderness Heart: regular rate and rhythm Lungs: Clear to auscultation bilaterally. Vascular: No carotid bruits. Neurological Exam: Mental status: alert and oriented to person, place, and time, recent and remote memory intact, fund of knowledge intact, attention and concentration intact, speech fluent and not dysarthric, language intact. Cranial nerves: CN I: not tested CN II: pupils equal, round and reactive to light, visual fields intact CN III, IV, VI:  full range of motion, no nystagmus, no ptosis CN V: facial sensation intact CN VII: upper and lower face symmetric CN VIII: hearing  intact CN IX, X: gag intact, uvula midline CN XI: sternocleidomastoid and trapezius muscles intact CN XII: tongue midline Bulk & Tone: normal, no fasciculations. Motor:  5/5 throughout  Sensation: temperature and vibration sensation intact. Deep Tendon Reflexes:  2+ throughout, toes downgoing.  Finger to nose testing:  Without dysmetria.  Heel to shin:  Without dysmetria.   Gait:  Normal station and stride.  Romberg negative.  IMPRESSION: 1.  Tension type headache, not intractable 2.  History of unruptured cerebral aneurysm status post clipping.  No recurrent aneurysm noted. 3.  Pulsatile tinnitus.  CTA of the head does  not reveal any secondary etiology. 4.  Incidental punctate lacunar infarct in the right basal ganglia noted on CT of the head.  Patient does have stroke risk factors such as hypertension. 5.  I do not have access to images of the prior MRIs.  However, I can say that she does not have ALS.  PLAN: 1.  For headaches, we will start nortriptyline 25 mg at bedtime.  If headaches not improved in 4 weeks, we can increase the dose to 50 mg at bedtime. 2.  Limit use of Tylenol to no more than 2 days out of the week to prevent risk of rebound headache. 3.  To further evaluate pulsatile tinnitus, we will check a carotid Doppler and TSH. 4.  Continue medical management of stroke risk factors (aspirin, blood pressure control, cholesterol control). 5.  Keep headache diary 6.  Follow-up in 3 to 4 months  Thank you for allowing me to take part in the care of this patient.  Metta Clines, DO  CC: Gar Ponto, MD

## 2018-03-19 ENCOUNTER — Ambulatory Visit: Payer: BLUE CROSS/BLUE SHIELD | Admitting: Neurology

## 2018-03-19 ENCOUNTER — Encounter: Payer: Self-pay | Admitting: Neurology

## 2018-03-19 ENCOUNTER — Other Ambulatory Visit (INDEPENDENT_AMBULATORY_CARE_PROVIDER_SITE_OTHER): Payer: BLUE CROSS/BLUE SHIELD

## 2018-03-19 VITALS — BP 110/78 | HR 70 | Ht 62.5 in | Wt 200.0 lb

## 2018-03-19 DIAGNOSIS — G44219 Episodic tension-type headache, not intractable: Secondary | ICD-10-CM | POA: Diagnosis not present

## 2018-03-19 DIAGNOSIS — H93A3 Pulsatile tinnitus, bilateral: Secondary | ICD-10-CM

## 2018-03-19 DIAGNOSIS — I671 Cerebral aneurysm, nonruptured: Secondary | ICD-10-CM | POA: Diagnosis not present

## 2018-03-19 LAB — TSH: TSH: 1.4 u[IU]/mL (ref 0.35–4.50)

## 2018-03-19 MED ORDER — NORTRIPTYLINE HCL 25 MG PO CAPS: 25.0000 mg | ORAL_CAPSULE | Freq: Every day | ORAL | 3 refills | Status: DC

## 2018-03-19 NOTE — Patient Instructions (Signed)
1.  Start nortriptyline 25mg  at bedtime.  If headaches not improved in 4 weeks, contact me and we can increase dose. 2.  Limit use of Tylenol (and all pain relievers) to no more than 2 days out of week to prevent risk of rebound or medication-overuse headache. 3.  To further evaluate the pulsatile tinnitus, we will check carotid doppler and TSH. 4.  Follow up in 3 to 4 months.

## 2018-03-24 ENCOUNTER — Telehealth: Payer: Self-pay | Admitting: *Deleted

## 2018-03-24 ENCOUNTER — Encounter: Payer: Self-pay | Admitting: *Deleted

## 2018-03-24 NOTE — Telephone Encounter (Signed)
Results sent via My Chart.  

## 2018-03-24 NOTE — Telephone Encounter (Signed)
-----   Message from Pieter Partridge, DO sent at 03/19/2018  5:43 PM EST ----- TSH is normal

## 2018-03-31 ENCOUNTER — Ambulatory Visit (HOSPITAL_COMMUNITY)
Admission: RE | Admit: 2018-03-31 | Discharge: 2018-03-31 | Disposition: A | Discharge status: Home / Self Care | Payer: BLUE CROSS/BLUE SHIELD | Source: Ambulatory Visit | Attending: Cardiovascular Disease | Admitting: Cardiovascular Disease

## 2018-03-31 DIAGNOSIS — G44219 Episodic tension-type headache, not intractable: Secondary | ICD-10-CM | POA: Diagnosis not present

## 2018-03-31 DIAGNOSIS — H93A3 Pulsatile tinnitus, bilateral: Secondary | ICD-10-CM | POA: Diagnosis not present

## 2018-03-31 DIAGNOSIS — I671 Cerebral aneurysm, nonruptured: Secondary | ICD-10-CM | POA: Insufficient documentation

## 2018-04-01 ENCOUNTER — Telehealth: Payer: Self-pay | Admitting: *Deleted

## 2018-04-01 ENCOUNTER — Encounter: Payer: Self-pay | Admitting: *Deleted

## 2018-04-01 NOTE — Telephone Encounter (Signed)
Results sent via My Chart.  

## 2018-04-01 NOTE — Telephone Encounter (Signed)
-----   Message from Pieter Partridge, DO sent at 03/31/2018  7:49 PM EST ----- Carotid Doppler is normal. I cannot find any specific cause for hearing her heartbeat in her head

## 2018-04-12 DIAGNOSIS — J069 Acute upper respiratory infection, unspecified: Secondary | ICD-10-CM | POA: Diagnosis not present

## 2018-04-12 DIAGNOSIS — Z6835 Body mass index (BMI) 35.0-35.9, adult: Secondary | ICD-10-CM | POA: Diagnosis not present

## 2018-04-14 DIAGNOSIS — J209 Acute bronchitis, unspecified: Secondary | ICD-10-CM | POA: Diagnosis not present

## 2018-04-14 DIAGNOSIS — R05 Cough: Secondary | ICD-10-CM | POA: Diagnosis not present

## 2018-04-14 DIAGNOSIS — Z6835 Body mass index (BMI) 35.0-35.9, adult: Secondary | ICD-10-CM | POA: Diagnosis not present

## 2018-04-24 ENCOUNTER — Encounter: Payer: Self-pay | Admitting: *Deleted

## 2018-04-24 ENCOUNTER — Ambulatory Visit: Payer: BLUE CROSS/BLUE SHIELD | Admitting: Cardiology

## 2018-04-24 ENCOUNTER — Encounter: Payer: Self-pay | Admitting: Cardiology

## 2018-04-24 VITALS — BP 106/72 | HR 62 | Ht 63.0 in | Wt 203.0 lb

## 2018-04-24 DIAGNOSIS — R002 Palpitations: Secondary | ICD-10-CM | POA: Diagnosis not present

## 2018-04-24 DIAGNOSIS — I10 Essential (primary) hypertension: Secondary | ICD-10-CM | POA: Diagnosis not present

## 2018-04-24 DIAGNOSIS — R0789 Other chest pain: Secondary | ICD-10-CM

## 2018-04-24 NOTE — Progress Notes (Signed)
Clinical Summary [name] [name] is a 57 y.o.female seen today for follow up of the following medical problems.  1. Chest pain - long history of atypical chest pain - she completed a lexiscan 03/2014  that was overall low risk, small apical defect probable artifact. - seen at [facility] 10/17 with chest pain. CT PE negative.  07/2015 nuclear probable apical thinning, cannot exlcude mild apical ischemia - seen in ER 08/2017 with chest pain, worst with movement and cough. No evidence of ischemia by EKG or enzymes  - no recent chest pain.   2. Palpitations - admit 03/2014 with palpitations and chest pain. On that day was hypertensive and tachycardic on admission. - MPI without evidence of ischemia, LVEF 78%. Echo "normal LVEF". TSH 1.4. Event monitor with occasional PACs, no significant arrhythmias   - holter 11/2016 no arrhythmias  - no recent palpitations. Occasional low heart rates at times at home, typically at night. Some dizziness, checks her vitals and heart rates    3. HTN - hydralazinepreviously stopped due to concerns for possible drug induced lupus. Symptoms better.  - her dilt was prevoiusly decreased, with this significant elvation in bp's and she went back to takeing 180mg  bid - has been on fairly high dose dual av nodal agents due to history of chronic palpitations, has tolerated regimen.    Past Medical History:  Diagnosis Date  . Aneurysm ([place])    brain  . Chest pain    denied on 06/18/2012  . Chronic kidney disease    L - upper pole- defect, doesn't effect     . Family history of anesthesia complication    brother & neice "flat lined" during induction: neice d/t a medication given for a blood clotting problem; brother d/t "miscalculated amount of anesthesia"  . Fibromyalgia   . GERD (gastroesophageal reflux disease)    h/o colitis   . H. pylori infection    2012- stomach biopsy  . H/O hiatal hernia   . Headache(784.0)    using tylenol prn  .  Hyperlipidemia   . Hypertension   . Hypoglycemia   . Normal cardiac stress test    pt. released fr. Dr. [name] care in 10/2011, all findings w/i normal limits  . Overweight(278.02)    Obesity  . Palpitations   . Sinusitis, acute    seen by PCP- 06/17/2012, will treat /w augmentin & tussin/DM  . Uterine fibroid    pt. states that she has a "closed cervix"      Allergies  Allergen Reactions  . Ace Inhibitors Swelling  . Azithromycin Anaphylaxis    Breathing difficulty, swelling  . Ciprofloxacin Nausea And Vomiting    Also: kidney failure   . Other Swelling, Other (See Comments) and Cough    Sneezing allergy to cats/grass/dust  . Statins Other (See Comments)    Cramping, muscle pain  . Medrol [Methylprednisolone] Palpitations    Heart racing, increased BP  . Tetracyclines & Related Palpitations    Heart racing, increased bp     Current Outpatient Medications  Medication Sig Dispense Refill  . acetaminophen (TYLENOL) 650 MG CR tablet Take 1,300 mg by mouth every 8 (eight) hours as needed for pain or fever.     [name] albuterol (PROAIR HFA) 108 (90 Base) MCG/ACT inhaler INHALE 2 PUFF EVERY 4 HOURS AS NEEDED    . aspirin EC 81 MG tablet Take 81 mg by mouth daily.     . chlorthalidone (HYGROTON) 25 MG tablet Take  0.5 tablets (12.5 mg total) by mouth daily. 30 tablet 3  . Cholecalciferol (D 1000) 1000 UNITS tablet Take 2,000 Units by mouth every evening.     . cyclobenzaprine (FLEXERIL) 5 MG tablet Take 1 tablet (5 mg total) by mouth every 8 (eight) hours as needed for muscle spasms. 30 tablet 6  . diltiazem (CARDIZEM CD) 180 MG 24 hr capsule Take 1 capsule (180 mg total) by mouth daily. (Patient taking differently: Take 180 mg by mouth 2 (two) times daily. ) 90 capsule 3  . loratadine (CLARITIN) 10 MG tablet Take 10 mg by mouth daily.     . metoprolol tartrate (LOPRESSOR) 50 MG tablet Take 1 tablet (50 mg total) by mouth 2 (two) times daily. (Patient taking differently: Take one tab  by mouth every morning, 1/2 tab every evening, & the other 1/2 as needed) 180 tablet 3  . nortriptyline (PAMELOR) 25 MG capsule Take 1 capsule (25 mg total) by mouth at bedtime. 30 capsule 3  . pantoprazole (PROTONIX) 40 MG tablet Take 40 mg by mouth daily.  1   No current facility-administered medications for this visit.      Past Surgical History:  Procedure Laterality Date  . COLONOSCOPY    . CRANIOTOMY Right 06/24/2012   Procedure: CRANIOTOMY INTRACRANIAL ANEURYSM FOR CAROTID;  Surgeon: [name], MD;  Location: [place] [facility];  Service: Neurosurgery;  Laterality: Right;  RIGHT Pterional Craniotomy for aneurysm  . LIPOMA EXCISION     buttock 10/2010     Allergies  Allergen Reactions  . Ace Inhibitors Swelling  . Azithromycin Anaphylaxis    Breathing difficulty, swelling  . Ciprofloxacin Nausea And Vomiting    Also: kidney failure   . Other Swelling, Other (See Comments) and Cough    Sneezing allergy to cats/grass/dust  . Statins Other (See Comments)    Cramping, muscle pain  . Medrol [Methylprednisolone] Palpitations    Heart racing, increased BP  . Tetracyclines & Related Palpitations    Heart racing, increased bp      Family History  Problem Relation Age of Onset  . Hypertension Other   . Coronary artery disease Other   . Dementia Mother   . Dementia Maternal Aunt      Social History [name] [name] reports that she quit smoking about 13 years ago. Her smoking use included cigarettes. She started smoking about 29 years ago. She has a 12.00 pack-year smoking history. She has never used smokeless tobacco. [name] [name] reports no history of alcohol use.   Review of Systems CONSTITUTIONAL: No weight loss, fever, chills, weakness or fatigue.  HEENT: Eyes: No visual loss, blurred vision, double vision or yellow sclerae.No hearing loss, sneezing, congestion, runny nose or sore throat.  SKIN: No rash or itching.  CARDIOVASCULAR: per hpi RESPIRATORY: No shortness  of breath, cough or sputum.  GASTROINTESTINAL: No anorexia, nausea, vomiting or diarrhea. No abdominal pain or blood.  GENITOURINARY: No burning on urination, no polyuria NEUROLOGICAL: No headache, dizziness, syncope, paralysis, ataxia, numbness or tingling in the extremities. No change in bowel or bladder control.  MUSCULOSKELETAL: No muscle, back pain, joint pain or stiffness.  LYMPHATICS: No enlarged nodes. No history of splenectomy.  PSYCHIATRIC: No history of depression or anxiety.  ENDOCRINOLOGIC: No reports of sweating, cold or heat intolerance. No polyuria or polydipsia.  [name]   Physical Examination Vitals:   04/24/18 1539  BP: 106/72  Pulse: 62  SpO2: 97%   Vitals:   04/24/18 1539  Weight: 203  lb (92.1 kg)  Height: 5\' 3"  (1.6 m)    Gen: resting comfortably, no acute distress HEENT: no scleral icterus, pupils equal round and reactive, no palptable cervical adenopathy,  CV: RRR, no m/r/g, no jvd Resp: Clear to auscultation bilaterally GI: abdomen is soft, non-tender, non-distended, normal bowel sounds, no hepatosplenomegaly MSK: extremities are warm, no edema.  Skin: warm, no rash Neuro:  no focal deficits Psych: appropriate affect   Diagnostic Studies 05/2009 Nuclear stress No ischemia  03/2014 Echo Study Conclusions  - Left ventricle: The cavity size was normal. Systolic function was normal. Wall motion was normal; there were no regional wall motion abnormalities.  03/2014 Lexiscan MPI IMPRESSION: 1. No reversible ischemia or infarction.  2. Normal left ventricular wall motion.  3. Left ventricular ejection fraction 78%  4. Low-risk stress test findings*.  03/2014 Event Monitor Symptoms correlate with NSR. One episode of fluttering showed 2 PACs. No significant arrhythmias  07/2015 Lexiscan MPI  There was no ST segment deviation noted during stress.  This is a low risk study.  The left ventricular ejection fraction is mildly decreased  (45-54%). LVEF is 49% just slightly below normal, visually appears normal. Consider correlating with echo.  There is a small mild intensity distal anterior and apical defect with mild reversibility. The apex has normal wall motion. Finding is likely secondary to apical thinning, there is also significant radiotracer uptake in the adjacent gut which may create some artifact. Cannot rule would mild apical ischemia. Overall findings are low risk.    11/2016 holter  Min HR 42, Max HR 94, Avg HR 57  No supraventricular or ventricular ectopy  Telemetry tracings show sinus bradycardia and sinus rhythm  No symptoms reported  No significant arrhythmias    Assessment and Plan   1. Chest pain -history of chronic atypical symptoms, ischemic testing over the years has been negative  - no recent symptoms, conitnue to monitor  2. Palpitations - recent negative holter. - has required high dose av nodal agents to control her symptoms. Infrequent low heart rates at night, counseled ok to hold or cut back her evening lopressor dose - conitnue currnet meds   3. HTN - bp at goal, continue current meds     Arnoldo Lenis, M.D.

## 2018-04-24 NOTE — Patient Instructions (Signed)

## 2018-04-30 ENCOUNTER — Ambulatory Visit: Payer: BLUE CROSS/BLUE SHIELD | Admitting: Neurology

## 2018-04-30 ENCOUNTER — Encounter

## 2018-05-19 ENCOUNTER — Other Ambulatory Visit: Payer: Self-pay

## 2018-05-19 ENCOUNTER — Inpatient Hospital Stay (HOSPITAL_COMMUNITY)
Admission: EM | Admit: 2018-05-19 | Discharge: 2018-05-24 | DRG: 392 | Disposition: A | Discharge status: Home / Self Care | Payer: BLUE CROSS/BLUE SHIELD | Attending: Internal Medicine | Admitting: Internal Medicine

## 2018-05-19 ENCOUNTER — Encounter (HOSPITAL_COMMUNITY): Payer: Self-pay | Admitting: Emergency Medicine

## 2018-05-19 ENCOUNTER — Emergency Department (HOSPITAL_COMMUNITY)
Admission: EM | Admit: 2018-05-19 | Discharge: 2018-05-19 | Disposition: A | Discharge status: Home / Self Care | Payer: BLUE CROSS/BLUE SHIELD | Attending: Emergency Medicine | Admitting: Emergency Medicine

## 2018-05-19 DIAGNOSIS — D72829 Elevated white blood cell count, unspecified: Secondary | ICD-10-CM | POA: Diagnosis not present

## 2018-05-19 DIAGNOSIS — R110 Nausea: Secondary | ICD-10-CM | POA: Insufficient documentation

## 2018-05-19 DIAGNOSIS — R002 Palpitations: Secondary | ICD-10-CM | POA: Diagnosis not present

## 2018-05-19 DIAGNOSIS — E1122 Type 2 diabetes mellitus with diabetic chronic kidney disease: Secondary | ICD-10-CM | POA: Diagnosis present

## 2018-05-19 DIAGNOSIS — K529 Noninfective gastroenteritis and colitis, unspecified: Secondary | ICD-10-CM | POA: Diagnosis not present

## 2018-05-19 DIAGNOSIS — Z7982 Long term (current) use of aspirin: Secondary | ICD-10-CM

## 2018-05-19 DIAGNOSIS — N183 Chronic kidney disease, stage 3 (moderate): Secondary | ICD-10-CM | POA: Diagnosis present

## 2018-05-19 DIAGNOSIS — M797 Fibromyalgia: Secondary | ICD-10-CM | POA: Diagnosis not present

## 2018-05-19 DIAGNOSIS — K219 Gastro-esophageal reflux disease without esophagitis: Secondary | ICD-10-CM | POA: Diagnosis not present

## 2018-05-19 DIAGNOSIS — R6883 Chills (without fever): Secondary | ICD-10-CM | POA: Diagnosis not present

## 2018-05-19 DIAGNOSIS — R1031 Right lower quadrant pain: Secondary | ICD-10-CM | POA: Diagnosis not present

## 2018-05-19 DIAGNOSIS — I129 Hypertensive chronic kidney disease with stage 1 through stage 4 chronic kidney disease, or unspecified chronic kidney disease: Secondary | ICD-10-CM | POA: Diagnosis present

## 2018-05-19 DIAGNOSIS — E785 Hyperlipidemia, unspecified: Secondary | ICD-10-CM | POA: Diagnosis not present

## 2018-05-19 DIAGNOSIS — N179 Acute kidney failure, unspecified: Secondary | ICD-10-CM | POA: Diagnosis not present

## 2018-05-19 DIAGNOSIS — Z800 Family history of malignant neoplasm of digestive organs: Secondary | ICD-10-CM | POA: Diagnosis not present

## 2018-05-19 DIAGNOSIS — N390 Urinary tract infection, site not specified: Secondary | ICD-10-CM | POA: Diagnosis not present

## 2018-05-19 DIAGNOSIS — Z87891 Personal history of nicotine dependence: Secondary | ICD-10-CM | POA: Diagnosis not present

## 2018-05-19 DIAGNOSIS — K449 Diaphragmatic hernia without obstruction or gangrene: Secondary | ICD-10-CM | POA: Diagnosis not present

## 2018-05-19 DIAGNOSIS — K5792 Diverticulitis of intestine, part unspecified, without perforation or abscess without bleeding: Secondary | ICD-10-CM | POA: Diagnosis not present

## 2018-05-19 DIAGNOSIS — E876 Hypokalemia: Secondary | ICD-10-CM | POA: Diagnosis not present

## 2018-05-19 DIAGNOSIS — Z888 Allergy status to other drugs, medicaments and biological substances status: Secondary | ICD-10-CM

## 2018-05-19 DIAGNOSIS — Z6835 Body mass index (BMI) 35.0-35.9, adult: Secondary | ICD-10-CM | POA: Diagnosis not present

## 2018-05-19 DIAGNOSIS — Z5321 Procedure and treatment not carried out due to patient leaving prior to being seen by health care provider: Secondary | ICD-10-CM | POA: Diagnosis not present

## 2018-05-19 DIAGNOSIS — R109 Unspecified abdominal pain: Secondary | ICD-10-CM | POA: Diagnosis not present

## 2018-05-19 DIAGNOSIS — K5732 Diverticulitis of large intestine without perforation or abscess without bleeding: Principal | ICD-10-CM | POA: Diagnosis present

## 2018-05-19 DIAGNOSIS — I10 Essential (primary) hypertension: Secondary | ICD-10-CM | POA: Diagnosis present

## 2018-05-19 DIAGNOSIS — K210 Gastro-esophageal reflux disease with esophagitis: Secondary | ICD-10-CM | POA: Diagnosis not present

## 2018-05-19 DIAGNOSIS — Z881 Allergy status to other antibiotic agents status: Secondary | ICD-10-CM | POA: Diagnosis not present

## 2018-05-19 DIAGNOSIS — R112 Nausea with vomiting, unspecified: Secondary | ICD-10-CM | POA: Diagnosis not present

## 2018-05-19 DIAGNOSIS — Z79899 Other long term (current) drug therapy: Secondary | ICD-10-CM | POA: Diagnosis not present

## 2018-05-19 DIAGNOSIS — K824 Cholesterolosis of gallbladder: Secondary | ICD-10-CM | POA: Diagnosis not present

## 2018-05-19 DIAGNOSIS — R1011 Right upper quadrant pain: Secondary | ICD-10-CM | POA: Diagnosis not present

## 2018-05-19 DIAGNOSIS — R1110 Vomiting, unspecified: Secondary | ICD-10-CM | POA: Diagnosis not present

## 2018-05-19 NOTE — ED Triage Notes (Signed)
Pt c/o of right lower abdominal pain with nausea and chills since last night

## 2018-05-20 ENCOUNTER — Emergency Department (HOSPITAL_COMMUNITY): Payer: BLUE CROSS/BLUE SHIELD

## 2018-05-20 ENCOUNTER — Encounter (HOSPITAL_COMMUNITY): Payer: Self-pay | Admitting: Internal Medicine

## 2018-05-20 ENCOUNTER — Other Ambulatory Visit: Payer: Self-pay

## 2018-05-20 DIAGNOSIS — K5792 Diverticulitis of intestine, part unspecified, without perforation or abscess without bleeding: Secondary | ICD-10-CM | POA: Diagnosis not present

## 2018-05-20 DIAGNOSIS — R1031 Right lower quadrant pain: Secondary | ICD-10-CM | POA: Diagnosis not present

## 2018-05-20 DIAGNOSIS — R112 Nausea with vomiting, unspecified: Secondary | ICD-10-CM | POA: Diagnosis not present

## 2018-05-20 DIAGNOSIS — K5732 Diverticulitis of large intestine without perforation or abscess without bleeding: Secondary | ICD-10-CM | POA: Diagnosis not present

## 2018-05-20 DIAGNOSIS — R1110 Vomiting, unspecified: Secondary | ICD-10-CM | POA: Diagnosis not present

## 2018-05-20 DIAGNOSIS — N179 Acute kidney failure, unspecified: Secondary | ICD-10-CM | POA: Diagnosis present

## 2018-05-20 LAB — COMPREHENSIVE METABOLIC PANEL
ALT: 12 U/L (ref 0–44)
AST: 19 U/L (ref 15–41)
Albumin: 3.7 g/dL (ref 3.5–5.0)
Alkaline Phosphatase: 49 U/L (ref 38–126)
Anion gap: 12 (ref 5–15)
BUN: 16 mg/dL (ref 6–20)
CHLORIDE: 101 mmol/L (ref 98–111)
CO2: 24 mmol/L (ref 22–32)
CREATININE: 1.44 mg/dL — AB (ref 0.44–1.00)
Calcium: 9.8 mg/dL (ref 8.9–10.3)
GFR calc Af Amer: 47 mL/min — ABNORMAL LOW (ref >60)
GFR, EST NON AFRICAN AMERICAN: 40 mL/min — AB (ref >60)
Glucose, Bld: 119 mg/dL — ABNORMAL HIGH (ref 70–99)
POTASSIUM: 3.6 mmol/L (ref 3.5–5.1)
Sodium: 137 mmol/L (ref 135–145)
Total Bilirubin: 1.2 mg/dL (ref 0.3–1.2)
Total Protein: 7.6 g/dL (ref 6.5–8.1)

## 2018-05-20 LAB — URINALYSIS, ROUTINE W REFLEX MICROSCOPIC
BACTERIA UA: NONE SEEN
Bilirubin Urine: NEGATIVE
GLUCOSE, UA: NEGATIVE mg/dL
KETONES UR: NEGATIVE mg/dL
NITRITE: NEGATIVE
Protein, ur: NEGATIVE mg/dL
Specific Gravity, Urine: 1.028 (ref 1.005–1.030)
pH: 5 (ref 5.0–8.0)

## 2018-05-20 LAB — CBC
HEMATOCRIT: 39.4 % (ref 36.0–46.0)
HEMOGLOBIN: 12.6 g/dL (ref 12.0–15.0)
MCH: 29 pg (ref 26.0–34.0)
MCHC: 32 g/dL (ref 30.0–36.0)
MCV: 90.6 fL (ref 80.0–100.0)
Platelets: 333 10*3/uL (ref 150–400)
RBC: 4.35 MIL/uL (ref 3.87–5.11)
RDW: 13 % (ref 11.5–15.5)
WBC: 26.9 10*3/uL — ABNORMAL HIGH (ref 4.0–10.5)
nRBC: 0 % (ref 0.0–0.2)

## 2018-05-20 LAB — LIPASE, BLOOD: LIPASE: 23 U/L (ref 11–51)

## 2018-05-20 LAB — I-STAT BETA HCG BLOOD, ED (MC, WL, AP ONLY): HCG, QUANTITATIVE: 12 m[IU]/mL — AB (ref <5)

## 2018-05-20 MED ORDER — ALBUTEROL SULFATE HFA 108 (90 BASE) MCG/ACT IN AERS
1.0000 | INHALATION_SPRAY | RESPIRATORY_TRACT | Status: DC | PRN
  Filled 2018-05-20: qty 6.7

## 2018-05-20 MED ORDER — METOPROLOL TARTRATE 25 MG PO TABS
25.0000 mg | ORAL_TABLET | Freq: Every day | ORAL | Status: DC
  Administered 2018-05-20 – 2018-05-23 (×4): 25 mg via ORAL
  Filled 2018-05-20 (×4): qty 1

## 2018-05-20 MED ORDER — IOHEXOL 300 MG/ML  SOLN
80.0000 mL | Freq: Once | INTRAMUSCULAR | Status: AC | PRN
  Administered 2018-05-20: 100 mL via INTRAVENOUS

## 2018-05-20 MED ORDER — METOPROLOL TARTRATE 50 MG PO TABS
50.0000 mg | ORAL_TABLET | Freq: Every day | ORAL | Status: DC
  Administered 2018-05-21 – 2018-05-24 (×4): 50 mg via ORAL
  Filled 2018-05-20 (×4): qty 1

## 2018-05-20 MED ORDER — ACETAMINOPHEN 325 MG PO TABS
650.0000 mg | ORAL_TABLET | Freq: Four times a day (QID) | ORAL | Status: DC | PRN
  Administered 2018-05-21 – 2018-05-24 (×3): 650 mg via ORAL
  Filled 2018-05-20 (×3): qty 2

## 2018-05-20 MED ORDER — ENOXAPARIN SODIUM 40 MG/0.4ML ~~LOC~~ SOLN
40.0000 mg | SUBCUTANEOUS | Status: DC
  Administered 2018-05-20 – 2018-05-23 (×4): 40 mg via SUBCUTANEOUS
  Filled 2018-05-20 (×5): qty 0.4

## 2018-05-20 MED ORDER — SODIUM CHLORIDE 0.9 % IV SOLN
1.5000 g | Freq: Four times a day (QID) | INTRAVENOUS | Status: DC
  Administered 2018-05-20 – 2018-05-21 (×2): 1.5 g via INTRAVENOUS
  Filled 2018-05-20 (×4): qty 1.5

## 2018-05-20 MED ORDER — SODIUM CHLORIDE 0.9 % IV SOLN
1.5000 g | Freq: Four times a day (QID) | INTRAVENOUS | Status: DC
  Administered 2018-05-20: 1.5 g via INTRAVENOUS
  Filled 2018-05-20 (×5): qty 1.5

## 2018-05-20 MED ORDER — MORPHINE SULFATE (PF) 4 MG/ML IV SOLN
4.0000 mg | Freq: Once | INTRAVENOUS | Status: AC
  Administered 2018-05-20: 4 mg via INTRAVENOUS
  Filled 2018-05-20: qty 1

## 2018-05-20 MED ORDER — ONDANSETRON HCL 4 MG PO TABS: 4.0000 mg | ORAL_TABLET | Freq: Four times a day (QID) | ORAL | Status: DC | PRN

## 2018-05-20 MED ORDER — METOPROLOL TARTRATE 25 MG PO TABS: 25.0000 mg | ORAL_TABLET | Freq: Every day | ORAL | Status: DC | PRN

## 2018-05-20 MED ORDER — OXYCODONE HCL 5 MG PO TABS
5.0000 mg | ORAL_TABLET | ORAL | Status: DC | PRN
  Administered 2018-05-21 – 2018-05-24 (×6): 5 mg via ORAL
  Filled 2018-05-20 (×6): qty 1

## 2018-05-20 MED ORDER — ONDANSETRON HCL 4 MG/2ML IJ SOLN: 4.0000 mg | Freq: Four times a day (QID) | INTRAMUSCULAR | Status: DC | PRN

## 2018-05-20 MED ORDER — AMPICILLIN-SULBACTAM SODIUM 3 (2-1) G IJ SOLR
3.0000 g | Freq: Once | INTRAMUSCULAR | Status: AC
  Administered 2018-05-20: 3 g via INTRAVENOUS
  Filled 2018-05-20: qty 3

## 2018-05-20 MED ORDER — ONDANSETRON HCL 4 MG/2ML IJ SOLN
4.0000 mg | Freq: Once | INTRAMUSCULAR | Status: AC
  Administered 2018-05-20: 4 mg via INTRAVENOUS
  Filled 2018-05-20: qty 2

## 2018-05-20 MED ORDER — MORPHINE SULFATE (PF) 4 MG/ML IV SOLN: 4.0000 mg | INTRAVENOUS | Status: DC | PRN

## 2018-05-20 MED ORDER — ACETAMINOPHEN 650 MG RE SUPP: 650.0000 mg | Freq: Four times a day (QID) | RECTAL | Status: DC | PRN

## 2018-05-20 MED ORDER — ALBUTEROL SULFATE (2.5 MG/3ML) 0.083% IN NEBU: 2.5000 mg | INHALATION_SOLUTION | RESPIRATORY_TRACT | Status: DC | PRN

## 2018-05-20 MED ORDER — OXYCODONE-ACETAMINOPHEN 5-325 MG PO TABS
1.0000 | ORAL_TABLET | ORAL | Status: AC | PRN
  Administered 2018-05-20 (×2): 1 via ORAL
  Filled 2018-05-20 (×2): qty 1

## 2018-05-20 MED ORDER — METOPROLOL TARTRATE 50 MG PO TABS
50.0000 mg | ORAL_TABLET | Freq: Two times a day (BID) | ORAL | Status: DC
  Administered 2018-05-20: 50 mg via ORAL
  Filled 2018-05-20: qty 2

## 2018-05-20 MED ORDER — SODIUM CHLORIDE 0.9 % IV BOLUS
1000.0000 mL | Freq: Once | INTRAVENOUS | Status: AC
  Administered 2018-05-20: 1000 mL via INTRAVENOUS

## 2018-05-20 NOTE — Consult Note (Signed)
[facility] Consult Note  [name] [name] 1960-07-29  [id number].    Requesting MD: Hal Hope Chief Complaint/Reason for Consult: RLQ pain  HPI:  Patient is a 58 year old female who presents to [facility] after 2 days of abdominal pain. Pain initially started periumbilically and then localized to RLQ, she went to [facility] where she had a CT that was negative for appendicitis and was sent home. Pain progressively worsened so she came to ED here. She currently reports pain is 9/10 in the RLQ constant and occasionally worsens with sharper pain. She reports that she had an episode of stool incontinence last night that was watery diarrhea. No blood or mucus in stool. Reports nausea and emesis, emesis was just clear fluid. Also reports fever/chills at home. Denies chest pain, SOB, urinary symptoms. She denies history of diverticulitis. Last colonoscopy 6 years ago and was normal to her knowledge. PMH otherwise significant for HTN, HLD, Hx of brain aneurysm and CKD. Patient denies blood thinners other than 81 mg ASA daily. No other past abdominal surgeries. She works as a Automotive engineer at [employer].   ROS: Review of Systems  Constitutional: Positive for chills and fever.  Respiratory: Negative for shortness of breath.   Cardiovascular: Negative for chest pain and palpitations.  Gastrointestinal: Positive for abdominal pain, diarrhea, nausea and vomiting. Negative for blood in stool, constipation and melena.  Genitourinary: Negative for dysuria, frequency and urgency.  All other systems reviewed and are negative.   Family History  Problem Relation Age of Onset  . Hypertension Other   . Coronary artery disease Other   . Dementia Mother   . Dementia Maternal Aunt     Past Medical History:  Diagnosis Date  . Aneurysm ([place])    brain  . Chest pain    denied on 06/18/2012  . Chronic kidney disease    L - upper pole- defect, doesn't effect     . Family history of anesthesia  complication    brother & neice "flat lined" during induction: neice d/t a medication given for a blood clotting problem; brother d/t "miscalculated amount of anesthesia"  . Fibromyalgia   . GERD (gastroesophageal reflux disease)    h/o colitis   . H. pylori infection    2012- stomach biopsy  . H/O hiatal hernia   . Headache(784.0)    using tylenol prn  . Hyperlipidemia   . Hypertension   . Hypoglycemia   . Normal cardiac stress test    pt. released fr. Dr. [name] care in 10/2011, all findings w/i normal limits  . Overweight(278.02)    Obesity  . Palpitations   . Sinusitis, acute    seen by PCP- 06/17/2012, will treat /w augmentin & tussin/DM  . Uterine fibroid    pt. states that she has a "closed cervix"     Past Surgical History:  Procedure Laterality Date  . COLONOSCOPY    . CRANIOTOMY Right 06/24/2012   Procedure: CRANIOTOMY INTRACRANIAL ANEURYSM FOR CAROTID;  Surgeon: [name], MD;  Location: [place] [facility];  Service: Neurosurgery;  Laterality: Right;  RIGHT Pterional Craniotomy for aneurysm  . LIPOMA EXCISION     buttock 10/2010    Social History:  reports that she quit smoking about 14 years ago. Her smoking use included cigarettes. She started smoking about 29 years ago. She has a 12.00 pack-year smoking history. She has never used smokeless tobacco. She reports that she does not drink alcohol or use drugs.  Allergies:  Allergies  Allergen Reactions  . Ace Inhibitors Swelling  . Azithromycin Anaphylaxis    Breathing difficulty, swelling  . Ciprofloxacin Nausea And Vomiting    Also: kidney failure   . Other Swelling, Other (See Comments) and Cough    Sneezing allergy to cats/grass/dust  . Statins Other (See Comments)    Cramping, muscle pain  . Medrol [Methylprednisolone] Palpitations    Heart racing, increased BP  . Tetracyclines & Related Palpitations    Heart racing, increased bp    (Not in a hospital admission)   Blood pressure 108/62, pulse (!)  59, temperature 99.4 F (37.4 C), temperature source Oral, resp. rate 18, height 5\' 3"  (1.6 m), weight 89.8 kg, last menstrual period 08/25/2012, SpO2 95 %. Physical Exam: Physical Exam Constitutional:      General: She is not in acute distress.    Appearance: She is obese. She is not toxic-appearing.  HENT:     Head: Normocephalic and atraumatic.     Mouth/Throat:     Mouth: Mucous membranes are moist.     Pharynx: Oropharynx is clear.  Eyes:     General: No scleral icterus.    Extraocular Movements: Extraocular movements intact.     Pupils: Pupils are equal, round, and reactive to light.  Cardiovascular:     Rate and Rhythm: Normal rate and regular rhythm.  Pulmonary:     Effort: Pulmonary effort is normal.     Breath sounds: Normal breath sounds.  Abdominal:     General: Bowel sounds are normal. There is no distension.     Palpations: Abdomen is soft.     Tenderness: There is abdominal tenderness in the right upper quadrant and right lower quadrant. There is no guarding or rebound.  Musculoskeletal:     Comments: ROM grossly intact in all 4 extremities.  Skin:    General: Skin is warm and dry.  Neurological:     General: No focal deficit present.     Mental Status: She is alert and oriented to person, place, and time.  Psychiatric:        Mood and Affect: Mood normal.        Behavior: Behavior normal.     Results for orders placed or performed during the hospital encounter of 05/19/18 (from the past 48 hour(s))  Lipase, blood     Status: None   Collection Time: 05/20/18 12:08 AM  Result Value Ref Range   Lipase 23 11 - 51 U/L    Comment: Performed at [facility], [place] [address] [place], [place] [address]  Comprehensive metabolic panel     Status: Abnormal   Collection Time: 05/20/18 12:08 AM  Result Value Ref Range   Sodium 137 135 - 145 mmol/L   Potassium 3.6 3.5 - 5.1 mmol/L   Chloride 101 98 - 111 mmol/L   CO2 24 22 - 32 mmol/L   Glucose, Bld 119 (H)  70 - 99 mg/dL   BUN 16 6 - 20 mg/dL   Creatinine, Ser 1.44 (H) 0.44 - 1.00 mg/dL   Calcium 9.8 8.9 - 10.3 mg/dL   Total Protein 7.6 6.5 - 8.1 g/dL   Albumin 3.7 3.5 - 5.0 g/dL   AST 19 15 - 41 U/L   ALT 12 0 - 44 U/L   Alkaline Phosphatase 49 38 - 126 U/L   Total Bilirubin 1.2 0.3 - 1.2 mg/dL   GFR calc non Af Amer 40 (L) >60 mL/min   GFR calc Af Wyvonnia Lora  47 (L) >60 mL/min   Anion gap 12 5 - 15    Comment: Performed at [place] [address], [place], [place] [address]  CBC     Status: Abnormal   Collection Time: 05/20/18 12:08 AM  Result Value Ref Range   WBC 26.9 (H) 4.0 - 10.5 K/uL   RBC 4.35 3.87 - 5.11 MIL/uL   Hemoglobin 12.6 12.0 - 15.0 g/dL   HCT 39.4 36.0 - 46.0 %   MCV 90.6 80.0 - 100.0 fL   MCH 29.0 26.0 - 34.0 pg   MCHC 32.0 30.0 - 36.0 g/dL   RDW 13.0 11.5 - 15.5 %   Platelets 333 150 - 400 K/uL   nRBC 0.0 0.0 - 0.2 %    Comment: Performed at [facility], [place] [address], [place], [place] [address]  I-Stat beta hCG blood, ED     Status: Abnormal   Collection Time: 05/20/18 12:17 AM  Result Value Ref Range   I-stat hCG, quantitative 12.0 (H) <5 mIU/mL   Comment 3            Comment:   GEST. AGE      CONC.  (mIU/mL)   <=1 WEEK        5 - 50     2 WEEKS       50 - 500     3 WEEKS       100 - 10,000     4 WEEKS     1,000 - 30,000        FEMALE AND NON-PREGNANT FEMALE:     LESS THAN 5 mIU/mL   Urinalysis, Routine w reflex microscopic     Status: Abnormal   Collection Time: 05/20/18  5:47 AM  Result Value Ref Range   Color, Urine YELLOW YELLOW   APPearance CLEAR CLEAR   Specific Gravity, Urine 1.028 1.005 - 1.030   pH 5.0 5.0 - 8.0   Glucose, UA NEGATIVE NEGATIVE mg/dL   Hgb urine dipstick SMALL (A) NEGATIVE   Bilirubin Urine NEGATIVE NEGATIVE   Ketones, ur NEGATIVE NEGATIVE mg/dL   Protein, ur NEGATIVE NEGATIVE mg/dL   Nitrite NEGATIVE NEGATIVE   Leukocytes, UA SMALL (A) NEGATIVE   RBC / HPF 0-5 0 - 5 RBC/hpf   WBC, UA 6-10 0 - 5 WBC/hpf    Bacteria, UA NONE SEEN NONE SEEN   Squamous Epithelial / LPF 6-10 0 - 5   Mucus PRESENT    Hyaline Casts, UA PRESENT     Comment: Performed at [facility], [address] [place], [place] [address]   Ct Abdomen Pelvis W Contrast  Result Date: 05/20/2018 CLINICAL DATA:  Right lower quadrant pain with nausea and vomiting that began yesterday EXAM: CT ABDOMEN AND PELVIS WITH CONTRAST TECHNIQUE: Multidetector CT imaging of the abdomen and pelvis was performed using the standard protocol following bolus administration of intravenous contrast. CONTRAST:  131mL OMNIPAQUE IOHEXOL 300 MG/ML  SOLN COMPARISON:  05/19/2018 FINDINGS: Lower chest:  No contributory findings. Hepatobiliary: No focal liver abnormality.No evidence of biliary obstruction or stone. Pancreas: Upper normal main pancreatic duct diameter at 3 mm, also prominent before and no obstructive process is seen. Spleen: Unremarkable. Adrenals/Urinary Tract: Negative adrenals. No hydronephrosis or stone. Duplicated left urinary collecting system. There is scarring of the upper pole moiety with diffuse ureteral dilatation that is currently collapsed. A ureterocele was seen on prior imaging, not seen today. Unremarkable bladder. Stomach/Bowel: There is new  mild fat stranding anterior to the ascending colon without clear underlying bowel wall thickening. There are a few diverticula in this region without discrete thickened diverticulum. The appendix extends inferiorly from the cecum and shows no wall thickening or mesenteric edema. Vascular/Lymphatic: No acute vascular abnormality. Mild atherosclerotic calcification of the aorta. No mass or adenopathy. Reproductive:Negative Other: No ascites or pneumoperitoneum. Musculoskeletal: No acute abnormalities. Degenerative facet spurring. IMPRESSION: 1. Mild fat stranding along the ascending colon that is new from yesterday. No definitive source, question diverticulitis as there are diverticula in this region.  An omental infarct is considered but given prominent leukocytosis infection seems more likely. No inflammation seen around the appendix. 2. 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 No evident active renal infection. Electronically Signed   By: Monte Fantasia M.D.   On: 05/20/2018 04:58      Assessment/Plan Hx of brain aneurysm HTN HLD CKD  Diverticulitis of ascending colon  - CT scan suggestive of diverticulitis, no perforation or abscess - WBC 26, Tmax 100.1 - focally tender on R side, no peritonitis - no indication for acute surgical intervention - recommend IV abx and CLD only until exam improves, can transition to PO abx when tolerating FLD - Will need outpatient colonoscopy in 6-8 weeks   Brigid Re, [facility] 05/20/2018, 8:47 AM Pager: [phone] Consults: [phone] Mon-Fri 7:00 am-4:30 pm Sat-Sun 7:00 am-11:30 am

## 2018-05-20 NOTE — ED Provider Notes (Signed)
[facility] EMERGENCY DEPARTMENT Provider Note   CSN: [id number] Arrival date & time: 05/19/18  2258     History   Chief Complaint Chief Complaint  Patient presents with  . Abdominal Pain    HPI [name] [name] is a 58 y.o. female.  The history is provided by the patient.  She has history of hypertension, hyperlipidemia, chronic kidney disease and comes in because of right lower quadrant abdominal pain.  Pain started at about midnight last night, and she went to the emergency department at [facility].  Pain initially was periumbilical, and then moved to her right flank.  She had an ultrasound and a CT scan, both of which were negative and she was discharged.  Since discharge, pain has been getting progressively worse and has now moved to the right lower quadrant.  She rates pain at 9/10.  It is worse with movement, coughing, laughing.  There has been nausea but no vomiting.  She denies fever, chills, sweats.  She did have one episode of diarrhea tonight.  Past Medical History:  Diagnosis Date  . Aneurysm ([place])    brain  . Chest pain    denied on 06/18/2012  . Chronic kidney disease    L - upper pole- defect, doesn't effect     . Family history of anesthesia complication    brother & neice "flat lined" during induction: neice d/t a medication given for a blood clotting problem; brother d/t "miscalculated amount of anesthesia"  . Fibromyalgia   . GERD (gastroesophageal reflux disease)    h/o colitis   . H. pylori infection    2012- stomach biopsy  . H/O hiatal hernia   . Headache(784.0)    using tylenol prn  . Hyperlipidemia   . Hypertension   . Hypoglycemia   . Normal cardiac stress test    pt. released fr. Dr. [name] care in 10/2011, all findings w/i normal limits  . Overweight(278.02)    Obesity  . Palpitations   . Sinusitis, acute    seen by PCP- 06/17/2012, will treat /w augmentin & tussin/DM  . Uterine fibroid    pt. states that she has a  "closed cervix"     Patient Active Problem List   Diagnosis Date Noted  . Neck pain 12/02/2016  . Bilateral occipital neuralgia 12/02/2016  . Cervical neuralgia 03/25/2016  . Tension headache 03/25/2016  . Chest tightness or pressure 04/03/2014  . Dizziness 04/03/2014  . Headache 04/03/2014  . Chest pain 04/02/2014  . GERD (gastroesophageal reflux disease) 04/02/2014  . Meningitis, unspecified(322.9) 08/11/2012  . 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 HLD (hyperlipidemia) 01/05/2009  . OVERWEIGHT/OBESITY 01/05/2009  . Essential hypertension 01/05/2009  . Palpitations 01/05/2009  . CHEST PAIN-UNSPECIFIED 01/05/2009    Past Surgical History:  Procedure Laterality Date  . COLONOSCOPY    . CRANIOTOMY Right 06/24/2012   Procedure: CRANIOTOMY INTRACRANIAL ANEURYSM FOR CAROTID;  Surgeon: [name], MD;  Location: [place] [facility];  Service: Neurosurgery;  Laterality: Right;  RIGHT Pterional Craniotomy for aneurysm  . LIPOMA EXCISION     buttock 10/2010     OB History   No obstetric history on file.      Home Medications    Prior to Admission medications   Medication Sig Start Date End Date Taking? Authorizing Provider  acetaminophen (TYLENOL) 650 MG CR tablet Take 1,300 mg by mouth every 8 (eight) hours as needed for pain or fever.     [provider]  albuterol (PROAIR HFA) 108 (90 Base) MCG/ACT inhaler INHALE 2 PUFF EVERY 4 HOURS AS NEEDED 06/06/15   [provider]  aspirin EC 81 MG tablet Take 81 mg by mouth daily.     [provider]  chlorthalidone (HYGROTON) 25 MG tablet Take 0.5 tablets (12.5 mg total) by mouth daily. 02/03/17 09/24/18  Herminio Commons, MD  diltiazem (CARDIZEM CD) 180 MG 24 hr capsule Take 1 capsule (180 mg total) by mouth daily. Patient taking differently: Take 180 mg by mouth 2 (two) times daily.  08/11/17   Fay Records, MD  loratadine (CLARITIN) 10 MG tablet Take 10 mg by mouth daily.     [provider]   metoprolol tartrate (LOPRESSOR) 50 MG tablet Take 1 tablet (50 mg total) by mouth 2 (two) times daily. Patient taking differently: Take one tab by mouth every morning, 1/2 tab every evening, & the other 1/2 as needed 08/11/17 11/09/17  Fay Records, MD    Family History Family History  Problem Relation Age of Onset  . Hypertension Other   . Coronary artery disease Other   . Dementia Mother   . Dementia Maternal Aunt     Social History Social History   Tobacco Use  . Smoking status: Former Smoker    Packs/day: 0.80    Years: 15.00    Pack years: 12.00    Types: Cigarettes    Start date: 02/13/1989    Last attempt to quit: 04/28/2004    Years since quitting: 14.0  . Smokeless tobacco: Never Used  Substance Use Topics  . Alcohol use: No    Alcohol/week: 0.0 standard drinks  . Drug use: No     Allergies   Ace inhibitors; Azithromycin; Ciprofloxacin; Other; Statins; Medrol [methylprednisolone]; and Tetracyclines & related   Review of Systems Review of Systems  All other systems reviewed and are negative.    Physical Exam Updated Vital Signs BP (!) 115/58 (BP Location: Right Arm)   Pulse 78   Temp 99.4 F (37.4 C) (Oral)   Resp 18   Ht 5\' 3"  (1.6 m)   Wt 89.8 kg   LMP 08/25/2012   SpO2 98%   BMI 35.07 kg/m   Physical Exam Vitals signs and nursing note reviewed.    58 year old female, resting comfortably and in no acute distress. Vital signs are normal. Oxygen saturation is 98%, which is normal. Head is normocephalic and atraumatic. PERRLA, EOMI. Oropharynx is clear. Neck is nontender and supple without adenopathy or JVD. Back is nontender and there is no CVA tenderness. Lungs are clear without rales, wheezes, or rhonchi. Chest is nontender. Heart has regular rate and rhythm without murmur. Abdomen is soft, flat, with marked tenderness in the right lower quadrant, fairly well localized to McBurney's area.  There is no clear rebound tenderness, but there  is tenderness with percussion.  There is no guarding.  There are no hepatosplenomegaly and peristalsis is hypoactive. Extremities have no cyanosis or edema, full range of motion is present. Skin is warm and dry without rash. Neurologic: Mental status is normal, cranial nerves are intact, there are no motor or sensory deficits.  ED Treatments / Results  Labs (all labs ordered are listed, but only abnormal results are displayed) Labs Reviewed  COMPREHENSIVE METABOLIC PANEL - Abnormal; Notable for the following components:      Result Value   Glucose, Bld 119 (*)    Creatinine, Ser 1.44 (*)    GFR calc non  Af Amer 40 (*)    GFR calc Af Amer 47 (*)    All other components within normal limits  CBC - Abnormal; Notable for the following components:   WBC 26.9 (*)    All other components within normal limits  I-STAT BETA HCG BLOOD, ED ([facility], [facility], [facility] ONLY) - Abnormal; Notable for the following components:   I-stat hCG, quantitative 12.0 (*)    All other components within normal limits  LIPASE, BLOOD  URINALYSIS, ROUTINE W REFLEX MICROSCOPIC   Radiology Ct Abdomen Pelvis W Contrast  Result Date: 05/20/2018 CLINICAL DATA:  Right lower quadrant pain with nausea and vomiting that began yesterday EXAM: CT ABDOMEN AND PELVIS WITH CONTRAST TECHNIQUE: Multidetector CT imaging of the abdomen and pelvis was performed using the standard protocol following bolus administration of intravenous contrast. CONTRAST:  1102mL OMNIPAQUE IOHEXOL 300 MG/ML  SOLN COMPARISON:  05/19/2018 FINDINGS: Lower chest:  No contributory findings. Hepatobiliary: No focal liver abnormality.No evidence of biliary obstruction or stone. Pancreas: Upper normal main pancreatic duct diameter at 3 mm, also prominent before and no obstructive process is seen. Spleen: Unremarkable. Adrenals/Urinary Tract: Negative adrenals. No hydronephrosis or stone. Duplicated left urinary collecting system. There is scarring of the upper pole moiety with  diffuse ureteral dilatation that is currently collapsed. A ureterocele was seen on prior imaging, not seen today. Unremarkable bladder. Stomach/Bowel: There is new mild fat stranding anterior to the ascending colon without clear underlying bowel wall thickening. There are a few diverticula in this region without discrete thickened diverticulum. The appendix extends inferiorly from the cecum and shows no wall thickening or mesenteric edema. Vascular/Lymphatic: No acute vascular abnormality. Mild atherosclerotic calcification of the aorta. No mass or adenopathy. Reproductive:Negative Other: No ascites or pneumoperitoneum. Musculoskeletal: No acute abnormalities. Degenerative facet spurring. IMPRESSION: 1. Mild fat stranding along the ascending colon that is new from yesterday. No definitive source, question diverticulitis as there are diverticula in this region. An omental infarct is considered but given prominent leukocytosis infection seems more likely. No inflammation seen around the appendix. 2. 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 No evident active renal infection. Electronically Signed   By: Monte Fantasia M.D.   On: 05/20/2018 04:58    Procedures Procedures   Medications Ordered in ED Medications  oxyCODONE-acetaminophen (PERCOCET/ROXICET) 5-325 MG per tablet 1 tablet (1 tablet Oral Given 05/20/18 0314)  Ampicillin-Sulbactam (UNASYN) 3 g in sodium chloride 0.9 % 100 mL IVPB (has no administration in time range)  ondansetron (ZOFRAN) injection 4 mg (4 mg Intravenous Given 05/20/18 0403)  morphine 4 MG/ML injection 4 mg (4 mg Intravenous Given 05/20/18 0403)  sodium chloride 0.9 % bolus 1,000 mL (1,000 mLs Intravenous New Bag/Given 05/20/18 0403)  iohexol (OMNIPAQUE) 300 MG/ML solution 80 mL (100 mLs Intravenous Contrast Given 05/20/18 0425)     Initial Impression / Assessment and Plan / ED Course  I have reviewed the triage vital signs and the nursing  notes.  Pertinent labs & imaging results that were available during my care of the patient were reviewed by me and considered in my medical decision making (see chart for details).  Right lower quadrant pain strongly suggestive of appendicitis.  I have reviewed her CT scan which was done yesterday, and it it does appear to show a normal appendix.  Labs today show elevated creatinine of 1.44, and markedly elevated WBC of 26.9.  Patient has her lab results from her prior ED visit, and at that time creatinine was  1.16, and WBC 10.4.  She is given IV fluid, morphine, ondansetron and will be sent for repeat CT scan.  CT is read as showing inflammatory changes on the right colon but with normal appendix.  She is feeling better after IV fluids and morphine.  On my reviewing the CT images, I am not 100% sure the appendix is not somewhat enlarged compared with yesterday, but it is not overt appendicitis.  She is started on IV antibiotics.  Case is discussed with Dr. Hal Hope of [facility] hospitalist who agrees to admit the patient.  We will also get general surgery consultation.  Final Clinical Impressions(s) / ED Diagnoses   Final diagnoses:  RLQ abdominal pain  Acute kidney injury (nontraumatic) (HCC)  Leukocytosis, unspecified type    ED Discharge Orders    None       Delora Fuel, MD 23/53/61 [phone]

## 2018-05-20 NOTE — H&P (Signed)
History and Physical    [name] [name] ZWC:[id number] DOB: [birth year] DOA: 05/19/2018  PCP: [name], MD  Patient coming from: Home.  I have personally briefly reviewed patient's old medical records in [place] and Care everywhere.   Chief Complaint: Abdominal pain.  HPI: [name] [name] is a 58 y.o. female with medical history significant of GERD, hypertension, hyperlipidemia who presents to the hospital with 2 days of abdominal pain.  According to the patient, she started having some epigastric/periumbellical pain 2 days ago, that was dull ache, mild to moderate in severity without radiation and associated with some nausea.  Since yesterday, she developed more pain on her right lower quadrant of the abdomen, severe, 9 out of 10, associated nausea and low-grade fever.  She has normal bowel movements, however she had one episode of diarrhea last night.  No sick contacts.  Latest antibiotic use was 3 months ago for sinus infection.  Urination is normal.  She works at [employer], she went to ER, a CT scan of the abdomen pelvis and ultrasound right upper quadrant was normal and she was suggested to follow-up with GI.  She went home, however continues to have right lower quadrant and right upper quadrant abdominal pain that is now worsening so came to ER.Pain is worse with movement.  Patient denies any recent travel.  Denies any sick contacts. ED Course: Patient is hemodynamically stable.  Her WC count is 26.9.  She has mild acute kidney injury.  A CT scan of the abdomen pelvis shows some inflammatory changes along the ascending colon without perforation.  Patient was given IV fluids, pain medications and Unasyn in the ER.  Surgery was also consulted.  Medicine consulted for admission.  Review of Systems: As per HPI otherwise 10 point review of systems negative.    Past Medical History:  Diagnosis Date  . 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 Chest pain    denied on 06/18/2012  . Chronic  kidney disease    L - upper pole- defect, doesn't effect     . Family history of anesthesia complication    brother & neice "flat lined" during induction: neice d/t a medication given for a blood clotting problem; brother d/t "miscalculated amount of anesthesia"  . Fibromyalgia   . GERD (gastroesophageal reflux disease)    h/o colitis   . H. pylori infection    2012- stomach biopsy  . H/O hiatal hernia   . Headache(784.0)    using tylenol prn  . Hyperlipidemia   . Hypertension   . Hypoglycemia   . Normal cardiac stress test    pt. released fr. Dr. [name] care in 10/2011, all findings w/i normal limits  . Overweight(278.02)    Obesity  . Palpitations   . Sinusitis, acute    seen by PCP- 06/17/2012, will treat /w augmentin & tussin/DM  . Uterine fibroid    pt. states that she has a "closed cervix"     Past Surgical History:  Procedure Laterality Date  . COLONOSCOPY    . CRANIOTOMY Right 06/24/2012   Procedure: CRANIOTOMY INTRACRANIAL ANEURYSM FOR CAROTID;  Surgeon: [name], MD;  Location: [facility];  Service: Neurosurgery;  Laterality: Right;  RIGHT Pterional Craniotomy for aneurysm  . LIPOMA EXCISION     buttock 10/2010     reports that she quit smoking about 14 years ago. Her smoking use included cigarettes. She started smoking about 29 years ago. She has a 12.00 pack-year smoking history.  She has never used smokeless tobacco. She reports that she does not drink alcohol or use drugs.  Allergies  Allergen Reactions  . Ace Inhibitors Swelling  . Azithromycin Anaphylaxis    Breathing difficulty, swelling  . Ciprofloxacin Nausea And Vomiting    Also: kidney failure   . Other Swelling, Other (See Comments) and Cough    Sneezing allergy to cats/grass/dust  . Statins Other (See Comments)    Cramping, muscle pain  . Medrol [Methylprednisolone] Palpitations    Heart racing, increased BP  . Tetracyclines & Related Palpitations    Heart racing, increased bp     Family History  Problem Relation Age of Onset  . Hypertension Other   . Coronary artery disease Other   . Dementia Mother   . Dementia Maternal Aunt      Prior to Admission medications   Medication Sig Start Date End Date Taking? Authorizing Provider  acetaminophen (TYLENOL) 650 MG CR tablet Take 1,300 mg by mouth every 8 (eight) hours as needed for pain or fever.    Yes [provider]  albuterol (PROAIR HFA) 108 (90 Base) MCG/ACT inhaler Inhale 1-2 puffs into the lungs every 4 (four) hours as needed for wheezing or shortness of breath.  06/06/15  Yes [provider]  aspirin EC 81 MG tablet Take 81 mg by mouth daily.    Yes [provider]  chlorthalidone (HYGROTON) 25 MG tablet Take 0.5 tablets (12.5 mg total) by mouth daily. 02/03/17 09/24/18 Yes Herminio Commons, MD  diltiazem (CARDIZEM CD) 180 MG 24 hr capsule Take 1 capsule (180 mg total) by mouth daily. Patient taking differently: Take 180 mg by mouth 2 (two) times daily.  08/11/17  Yes Fay Records, MD  loratadine (CLARITIN) 10 MG tablet Take 10 mg by mouth daily.    Yes [provider]  metoprolol tartrate (LOPRESSOR) 50 MG tablet Take 1 tablet (50 mg total) by mouth 2 (two) times daily. Patient taking differently: Take 25-50 mg by mouth See admin instructions. Take one tab by mouth every morning, 1/2 tab every evening, & the other 1/2 as needed for AFIB 08/11/17 05/20/18 Yes Fay Records, MD    Physical Exam: Vitals:   05/20/18 0006 05/20/18 0210 05/20/18 0415 05/20/18 0615  BP:   111/67 108/62  Pulse:   64 (!) 59  Resp:      Temp:  99.4 F (37.4 C)    TempSrc:  Oral    SpO2:   92% 95%  Weight: 89.8 kg     Height: 5\' 3"  (1.6 m)       Constitutional: NAD, calm, comfortable Vitals:   05/20/18 0006 05/20/18 0210 05/20/18 0415 05/20/18 0615  BP:   111/67 108/62  Pulse:   64 (!) 59  Resp:      Temp:  99.4 F (37.4 C)    TempSrc:  Oral    SpO2:   92% 95%  Weight: 89.8 kg      Height: 5\' 3"  (1.6 m)      Eyes: PERRL, lids and conjunctivae normal ENMT: Mucous membranes are moist. Posterior pharynx clear of any exudate or lesions.Normal dentition.  Neck: normal, supple, no masses, no thyromegaly Respiratory: clear to auscultation bilaterally, no wheezing, no crackles. Normal respiratory effort. No accessory muscle use.  Cardiovascular: Regular rate and rhythm, no murmurs / rubs / gallops. No extremity edema. 2+ pedal pulses. No carotid bruits.  Abdomen: Obese.  Patient has moderate tenderness with no rigidity or  guarding along right lower quadrant. No hepatosplenomegaly. Bowel sounds positive.  Musculoskeletal: no clubbing / cyanosis. No joint deformity upper and lower extremities. Good ROM, no contractures. Normal muscle tone.  Skin: no rashes, lesions, ulcers. No induration Neurologic: CN 2-12 grossly intact. Sensation intact, DTR normal. Strength 5/5 in all 4.  Psychiatric: Normal judgment and insight. Alert and oriented x 3. Normal mood.     Labs on Admission: I have personally reviewed following labs and imaging studies  CBC: Recent Labs  Lab 05/20/18 0008  WBC 26.9*  HGB 12.6  HCT 39.4  MCV 90.6  PLT 245   Basic Metabolic Panel: Recent Labs  Lab 05/20/18 0008  NA 137  K 3.6  CL 101  CO2 24  GLUCOSE 119*  BUN 16  CREATININE 1.44*  CALCIUM 9.8   GFR: Estimated Creatinine Clearance: 45.9 mL/min (A) (by C-G formula based on SCr of 1.44 mg/dL (H)). Liver Function Tests: Recent Labs  Lab 05/20/18 0008  AST 19  ALT 12  ALKPHOS 49  BILITOT 1.2  PROT 7.6  ALBUMIN 3.7   Recent Labs  Lab 05/20/18 0008  LIPASE 23   No results for input(s): AMMONIA in the last 168 hours. Coagulation Profile: No results for input(s): INR, PROTIME in the last 168 hours. Cardiac Enzymes: No results for input(s): CKTOTAL, CKMB, CKMBINDEX, TROPONINI in the last 168 hours. BNP (last 3 results) No results for input(s): PROBNP in the last 8760  hours. HbA1C: No results for input(s): HGBA1C in the last 72 hours. CBG: No results for input(s): GLUCAP in the last 168 hours. Lipid Profile: No results for input(s): CHOL, HDL, LDLCALC, TRIG, CHOLHDL, LDLDIRECT in the last 72 hours. Thyroid Function Tests: No results for input(s): TSH, T4TOTAL, FREET4, T3FREE, THYROIDAB in the last 72 hours. Anemia Panel: No results for input(s): VITAMINB12, FOLATE, FERRITIN, TIBC, IRON, RETICCTPCT in the last 72 hours. Urine analysis:    Component Value Date/Time   COLORURINE YELLOW 05/20/2018 [place] 05/20/2018 0547   APPEARANCEUR Clear 03/12/2016 1619   LABSPEC 1.028 05/20/2018 0547   PHURINE 5.0 05/20/2018 0547   GLUCOSEU NEGATIVE 05/20/2018 0547   HGBUR SMALL (A) 05/20/2018 0547   BILIRUBINUR NEGATIVE 05/20/2018 0547   BILIRUBINUR Negative 03/12/2016 [place] 05/20/2018 0547   PROTEINUR NEGATIVE 05/20/2018 0547   UROBILINOGEN 0.2 08/30/2012 1339   NITRITE NEGATIVE 05/20/2018 0547   LEUKOCYTESUR SMALL (A) 05/20/2018 0547   LEUKOCYTESUR 2+ (A) 03/12/2016 1619    Radiological Exams on Admission: Ct Abdomen Pelvis W Contrast  Result Date: 05/20/2018 CLINICAL DATA:  Right lower quadrant pain with nausea and vomiting that began yesterday EXAM: CT ABDOMEN AND PELVIS WITH CONTRAST TECHNIQUE: Multidetector CT imaging of the abdomen and pelvis was performed using the standard protocol following bolus administration of intravenous contrast. CONTRAST:  184mL OMNIPAQUE IOHEXOL 300 MG/ML  SOLN COMPARISON:  05/19/2018 FINDINGS: Lower chest:  No contributory findings. Hepatobiliary: No focal liver abnormality.No evidence of biliary obstruction or stone. Pancreas: Upper normal main pancreatic duct diameter at 3 mm, also prominent before and no obstructive process is seen. Spleen: Unremarkable. Adrenals/Urinary Tract: Negative adrenals. No hydronephrosis or stone. Duplicated left urinary collecting system. There is scarring  of the upper pole moiety with diffuse ureteral dilatation that is currently collapsed. A ureterocele was seen on prior imaging, not seen today. Unremarkable bladder. Stomach/Bowel: There is new mild fat stranding anterior to the ascending colon without clear underlying bowel wall thickening. There are a few diverticula in  this region without discrete thickened diverticulum. The appendix extends inferiorly from the cecum and shows no wall thickening or mesenteric edema. Vascular/Lymphatic: No acute vascular abnormality. Mild atherosclerotic calcification of the aorta. No mass or adenopathy. Reproductive:Negative Other: No ascites or pneumoperitoneum. Musculoskeletal: No acute abnormalities. Degenerative facet spurring. IMPRESSION: 1. Mild fat stranding along the ascending colon that is new from yesterday. No definitive source, question diverticulitis as there are diverticula in this region. An omental infarct is considered but given prominent leukocytosis infection seems more likely. No inflammation seen around the appendix. 2. 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 No evident active renal infection. Electronically Signed   By: Monte Fantasia M.D.   On: 05/20/2018 04:58     Assessment/Plan Principal Problem:   Acute diverticulitis Active Problems:   HLD (hyperlipidemia)   Essential hypertension   GERD (gastroesophageal reflux disease)   AKI (acute kidney injury) ([place])   Acute ascending colon diverticulitis/colitis: Agree with admission given severity of symptoms.  Adequate IV fluids. We will continue IV Unasyn.  N.p.o. until patient has pain.  Adequate oral and IV opiates for pain relief.  Less likely will need any surgical intervention.  If patient has more diarrhea, will check for C. difficile.  Will be seen by surgery, will follow recommendations.  Patient will need follow-up with GI once acute symptoms subside.  Hypertension: With history.  Her blood pressures  are soft.  Will hold antihypertensive medications.  GERD: On PPI.  Will continue.  Acute kidney injury: Probably due to above.  Will avoid nephrotoxic medications.  Intake and output monitoring.  IV fluid hydration and recheck levels in the morning.  Hyperlipidemia: On a statin.  She will resume on discharge.  DVT prophylaxis: Lovenox. Code Status: Full code. Family Communication: No family at bedside. Disposition Plan: Home. Consults called: Surgery. Admission status: Observation.   Barb Merino MD [facility] Pager [phone]  If 7PM-7AM, please contact night-coverage [email or web address] Password [facility]  05/20/2018, 7:48 AM

## 2018-05-20 NOTE — ED Triage Notes (Signed)
Per pt she has been having right lower quadrant pain since yesterday at 12 am. Pt went to [facility] and had CT and blood work. Pt having N/V and low grade fever. 100.3.

## 2018-05-21 DIAGNOSIS — Z7982 Long term (current) use of aspirin: Secondary | ICD-10-CM | POA: Diagnosis not present

## 2018-05-21 DIAGNOSIS — I10 Essential (primary) hypertension: Secondary | ICD-10-CM | POA: Diagnosis not present

## 2018-05-21 DIAGNOSIS — K5792 Diverticulitis of intestine, part unspecified, without perforation or abscess without bleeding: Secondary | ICD-10-CM | POA: Diagnosis not present

## 2018-05-21 DIAGNOSIS — R1031 Right lower quadrant pain: Secondary | ICD-10-CM | POA: Diagnosis present

## 2018-05-21 DIAGNOSIS — Z800 Family history of malignant neoplasm of digestive organs: Secondary | ICD-10-CM | POA: Diagnosis not present

## 2018-05-21 DIAGNOSIS — R002 Palpitations: Secondary | ICD-10-CM | POA: Diagnosis not present

## 2018-05-21 DIAGNOSIS — D72829 Elevated white blood cell count, unspecified: Secondary | ICD-10-CM | POA: Diagnosis not present

## 2018-05-21 DIAGNOSIS — K449 Diaphragmatic hernia without obstruction or gangrene: Secondary | ICD-10-CM | POA: Diagnosis present

## 2018-05-21 DIAGNOSIS — N179 Acute kidney failure, unspecified: Secondary | ICD-10-CM

## 2018-05-21 DIAGNOSIS — Z888 Allergy status to other drugs, medicaments and biological substances status: Secondary | ICD-10-CM | POA: Diagnosis not present

## 2018-05-21 DIAGNOSIS — Z881 Allergy status to other antibiotic agents status: Secondary | ICD-10-CM | POA: Diagnosis not present

## 2018-05-21 DIAGNOSIS — K210 Gastro-esophageal reflux disease with esophagitis: Secondary | ICD-10-CM | POA: Diagnosis not present

## 2018-05-21 DIAGNOSIS — I129 Hypertensive chronic kidney disease with stage 1 through stage 4 chronic kidney disease, or unspecified chronic kidney disease: Secondary | ICD-10-CM | POA: Diagnosis present

## 2018-05-21 DIAGNOSIS — M797 Fibromyalgia: Secondary | ICD-10-CM | POA: Diagnosis present

## 2018-05-21 DIAGNOSIS — K529 Noninfective gastroenteritis and colitis, unspecified: Secondary | ICD-10-CM | POA: Diagnosis present

## 2018-05-21 DIAGNOSIS — K219 Gastro-esophageal reflux disease without esophagitis: Secondary | ICD-10-CM | POA: Diagnosis present

## 2018-05-21 DIAGNOSIS — Z87891 Personal history of nicotine dependence: Secondary | ICD-10-CM | POA: Diagnosis not present

## 2018-05-21 DIAGNOSIS — Z6835 Body mass index (BMI) 35.0-35.9, adult: Secondary | ICD-10-CM | POA: Diagnosis not present

## 2018-05-21 DIAGNOSIS — E876 Hypokalemia: Secondary | ICD-10-CM | POA: Diagnosis not present

## 2018-05-21 DIAGNOSIS — N183 Chronic kidney disease, stage 3 (moderate): Secondary | ICD-10-CM | POA: Diagnosis present

## 2018-05-21 DIAGNOSIS — E1122 Type 2 diabetes mellitus with diabetic chronic kidney disease: Secondary | ICD-10-CM | POA: Diagnosis present

## 2018-05-21 DIAGNOSIS — K5732 Diverticulitis of large intestine without perforation or abscess without bleeding: Secondary | ICD-10-CM | POA: Diagnosis not present

## 2018-05-21 DIAGNOSIS — E785 Hyperlipidemia, unspecified: Secondary | ICD-10-CM | POA: Diagnosis present

## 2018-05-21 LAB — BASIC METABOLIC PANEL
Anion gap: 10 (ref 5–15)
BUN: 17 mg/dL (ref 6–20)
CALCIUM: 8.9 mg/dL (ref 8.9–10.3)
CO2: 25 mmol/L (ref 22–32)
Chloride: 102 mmol/L (ref 98–111)
Creatinine, Ser: 1.38 mg/dL — ABNORMAL HIGH (ref 0.44–1.00)
GFR calc Af Amer: 49 mL/min — ABNORMAL LOW (ref >60)
GFR, EST NON AFRICAN AMERICAN: 42 mL/min — AB (ref >60)
Glucose, Bld: 79 mg/dL (ref 70–99)
Potassium: 3 mmol/L — ABNORMAL LOW (ref 3.5–5.1)
Sodium: 137 mmol/L (ref 135–145)

## 2018-05-21 LAB — HIV ANTIBODY (ROUTINE TESTING W REFLEX): HIV Screen 4th Generation wRfx: NONREACTIVE

## 2018-05-21 LAB — CBC
HCT: 31.7 % — ABNORMAL LOW (ref 36.0–46.0)
Hemoglobin: 10.6 g/dL — ABNORMAL LOW (ref 12.0–15.0)
MCH: 29.9 pg (ref 26.0–34.0)
MCHC: 33.4 g/dL (ref 30.0–36.0)
MCV: 89.5 fL (ref 80.0–100.0)
PLATELETS: 278 10*3/uL (ref 150–400)
RBC: 3.54 MIL/uL — ABNORMAL LOW (ref 3.87–5.11)
RDW: 13.2 % (ref 11.5–15.5)
WBC: 12.4 10*3/uL — ABNORMAL HIGH (ref 4.0–10.5)
nRBC: 0 % (ref 0.0–0.2)

## 2018-05-21 LAB — MAGNESIUM: MAGNESIUM: 2 mg/dL (ref 1.7–2.4)

## 2018-05-21 MED ORDER — SODIUM CHLORIDE 0.9 % IV SOLN
INTRAVENOUS | Status: DC
  Administered 2018-05-21 – 2018-05-22 (×2): via INTRAVENOUS

## 2018-05-21 MED ORDER — POTASSIUM CHLORIDE IN NACL 40-0.9 MEQ/L-% IV SOLN
INTRAVENOUS | Status: DC
  Administered 2018-05-21: 75 mL/h via INTRAVENOUS
  Filled 2018-05-21: qty 1000

## 2018-05-21 MED ORDER — AMOXICILLIN-POT CLAVULANATE 875-125 MG PO TABS
1.0000 | ORAL_TABLET | Freq: Two times a day (BID) | ORAL | Status: DC
  Administered 2018-05-21 – 2018-05-24 (×7): 1 via ORAL
  Filled 2018-05-21 (×9): qty 1

## 2018-05-21 MED ORDER — POTASSIUM CHLORIDE CRYS ER 20 MEQ PO TBCR
40.0000 meq | EXTENDED_RELEASE_TABLET | Freq: Once | ORAL | Status: AC
  Administered 2018-05-21: 40 meq via ORAL
  Filled 2018-05-21: qty 2

## 2018-05-21 NOTE — Progress Notes (Signed)
Subjective No acute events. Feeling much better today. Abdominal pain significantly improved and almost gone. Denies n/v  Objective: Vital signs in last 24 hours: Temp:  [97.6 F (36.4 C)-98.4 F (36.9 C)] 98.2 F (36.8 C) (01/23 0441) Pulse Rate:  [60-71] 71 (01/23 0441) Resp:  [16-18] 17 (01/23 0441) BP: (95-132)/(52-72) 106/55 (01/23 0441) SpO2:  [96 %-99 %] 97 % (01/23 0441) Weight:  [88 kg-89.8 kg] 89.8 kg (01/23 0257) Last BM Date: 05/19/18  Intake/Output from previous day: 01/22 0701 - 01/23 0700 In: 210 [IV Piggyback:210] Out: -  Intake/Output this shift: No intake/output data recorded.  Gen: NAD, comfortable CV: RRR Pulm: Normal work of breathing Abd: Soft, minimal tenderness RLQ; nondistended. No rebound. No guarding. Ext: SCDs in place  Lab Results: CBC  Recent Labs    05/20/18 0008 05/21/18 0245  WBC 26.9* 12.4*  HGB 12.6 10.6*  HCT 39.4 31.7*  PLT 333 278   BMET Recent Labs    05/20/18 0008 05/21/18 0245  NA 137 137  K 3.6 3.0*  CL 101 102  CO2 24 25  GLUCOSE 119* 79  BUN 16 17  CREATININE 1.44* 1.38*  CALCIUM 9.8 8.9   PT/INR No results for input(s): LABPROT, INR in the last 72 hours. ABG No results for input(s): PHART, HCO3 in the last 72 hours.  Invalid input(s): PCO2, PO2  Studies/Results:  Anti-infectives: Anti-infectives (From admission, onward)   Start     Dose/Rate Route Frequency Ordered Stop   05/21/18 1000  amoxicillin-clavulanate (AUGMENTIN) 875-125 MG per tablet 1 tablet     1 tablet Oral Every 12 hours 05/21/18 0847     05/20/18 2100  ampicillin-sulbactam (UNASYN) 1.5 g in sodium chloride 0.9 % 100 mL IVPB  Status:  Discontinued     1.5 g 200 mL/hr over 30 Minutes Intravenous Every 6 hours 05/20/18 1747 05/21/18 0845   05/20/18 1200  ampicillin-sulbactam (UNASYN) 1.5 g in sodium chloride 0.9 % 100 mL IVPB  Status:  Discontinued     1.5 g 200 mL/hr over 30 Minutes Intravenous Every 6 hours 05/20/18 1027 05/20/18 1747    05/20/18 0530  Ampicillin-Sulbactam (UNASYN) 3 g in sodium chloride 0.9 % 100 mL IVPB     3 g 200 mL/hr over 30 Minutes Intravenous  Once 05/20/18 0511 05/20/18 0739       Assessment/Plan: Patient Active Problem List   Diagnosis Date Noted  . Acute diverticulitis 05/20/2018  . AKI (acute kidney injury) ([place]) 05/20/2018  . Neck pain 12/02/2016  . Bilateral occipital neuralgia 12/02/2016  . Cervical neuralgia 03/25/2016  . Tension headache 03/25/2016  . Chest tightness or pressure 04/03/2014  . Dizziness 04/03/2014  . Headache 04/03/2014  . Chest pain 04/02/2014  . GERD (gastroesophageal reflux disease) 04/02/2014  . Meningitis, unspecified(322.9) 08/11/2012  . 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 HLD (hyperlipidemia) 01/05/2009  . OVERWEIGHT/OBESITY 01/05/2009  . Essential hypertension 01/05/2009  . Palpitations 01/05/2009  . CHEST PAIN-UNSPECIFIED 01/05/2009   [name] [name] is a pleasant 29yoF admitted with diverticulitis of asc colon  -Ok for clear liquids, advance as tolerated -Can transition to PO abx today/tomorrow -No plans for surgical intervention at this point -Will need colonoscopy in 6-8 weeks - I discussed rational with her as well -We will sign off - please call us if questions or concerns arise   LOS: 0 days   Sharon Mt. Dema Severin, M.D. [facility]

## 2018-05-21 NOTE — Progress Notes (Addendum)
Progress Note    [name] [name]  GYJ:[id number] DOB: [birth year]  DOA: 05/19/2018 PCP: [name], MD    Brief Narrative:     Medical records reviewed and are as summarized below:  [name] [name] is an 58 y.o. female with medical history significant of GERD, hypertension, hyperlipidemia who presents to the hospital with 2 days of abdominal pain.  According to the patient, she started having some epigastric/periumbellical pain 2 days ago, that was dull ache, mild to moderate in severity without radiation and associated with some nausea.  Since yesterday, she developed more pain on her right lower quadrant of the abdomen, severe, 9 out of 10, associated nausea and low-grade fever.  She has normal bowel movements, however she had one episode of diarrhea last night.   Assessment/Plan:   Principal Problem:   Acute diverticulitis Active Problems:   HLD (hyperlipidemia)   Essential hypertension   GERD (gastroesophageal reflux disease)   AKI (acute kidney injury) ([place])  Acute diverticulitis/colitis:  -IV fluids -change IV to PO -leukocytosis resolving -clear diet advance as tolerated -in 6-8 weeks will need outpatient colonoscopy  Hypertension:  -blood pressures are soft -PO lopressor  Hypokalemia -replete -check Mg  GERD:  -On PPI  Acute kidney injury:  -avoid nephrotoxic medications - IV fluid hydration and recheck levels in the morning.  Hyperlipidemia:  -On a statin.   - resume on discharge.  obesity Body mass index is 35.07 kg/m.  Not taking POs well, may need more time with IVF, IV zofran and a slower advancement of diet.   Family Communication/Anticipated D/C date and plan/Code Status   DVT prophylaxis: Lovenox ordered. Code Status: Full Code.  Family Communication:   Disposition Plan: when tolerating POs   Medical Consultants:    General surgery    Subjective:   Still having pain in abdomen  Objective:    Vitals:   05/20/18 2233 05/20/18 2351 05/21/18 0257 05/21/18 0441  BP: 95/66 117/66  (!) 106/55  Pulse:  65  71  Resp:  18  17  Temp:  97.6 F (36.4 C)  98.2 F (36.8 C)  TempSrc:  Oral  Oral  SpO2:  97%  97%  Weight:   89.8 kg   Height:        Intake/Output Summary (Last 24 hours) at 05/21/2018 0929 Last data filed at 05/21/2018 0300 Gross per 24 hour  Intake 210.03 ml  Output -  Net 210.03 ml   Filed Weights   05/20/18 0006 05/20/18 1309 05/21/18 0257  Weight: 89.8 kg 88 kg 89.8 kg    Exam: In bed, NAD rrr No wheezing No LE Edema +BS, soft, min tenderness  Data Reviewed:   I have personally reviewed following labs and imaging studies:  Labs: Labs show the following:   Basic Metabolic Panel: Recent Labs  Lab 05/20/18 0008 05/21/18 0245  NA 137 137  K 3.6 3.0*  CL 101 102  CO2 24 25  GLUCOSE 119* 79  BUN 16 17  CREATININE 1.44* 1.38*  CALCIUM 9.8 8.9   GFR Estimated Creatinine Clearance: 47.9 mL/min (A) (by C-G formula based on SCr of 1.38 mg/dL (H)). Liver Function Tests: Recent Labs  Lab 05/20/18 0008  AST 19  ALT 12  ALKPHOS 49  BILITOT 1.2  PROT 7.6  ALBUMIN 3.7   Recent Labs  Lab 05/20/18 0008  LIPASE 23   No results for input(s): AMMONIA in the last 168 hours. Coagulation profile No results for input(s): INR,  PROTIME in the last 168 hours.  CBC: Recent Labs  Lab 05/20/18 0008 05/21/18 0245  WBC 26.9* 12.4*  HGB 12.6 10.6*  HCT 39.4 31.7*  MCV 90.6 89.5  PLT 333 278   Cardiac Enzymes: No results for input(s): CKTOTAL, CKMB, CKMBINDEX, TROPONINI in the last 168 hours. BNP (last 3 results) No results for input(s): PROBNP in the last 8760 hours. CBG: No results for input(s): GLUCAP in the last 168 hours. D-Dimer: No results for input(s): DDIMER in the last 72 hours. Hgb A1c: No results for input(s): HGBA1C in the last 72 hours. Lipid Profile: No results for input(s): CHOL, HDL, LDLCALC, TRIG, CHOLHDL, LDLDIRECT in the last 72  hours. Thyroid function studies: No results for input(s): TSH, T4TOTAL, T3FREE, THYROIDAB in the last 72 hours.  Invalid input(s): FREET3 Anemia work up: No results for input(s): VITAMINB12, FOLATE, FERRITIN, TIBC, IRON, RETICCTPCT in the last 72 hours. Sepsis Labs: Recent Labs  Lab 05/20/18 0008 05/21/18 0245  WBC 26.9* 12.4*    Microbiology No results found for this or any previous visit (from the past 240 hour(s)).  Procedures and diagnostic studies:  Ct Abdomen Pelvis W Contrast  Result Date: 05/20/2018 CLINICAL DATA:  Right lower quadrant pain with nausea and vomiting that began yesterday EXAM: CT ABDOMEN AND PELVIS WITH CONTRAST TECHNIQUE: Multidetector CT imaging of the abdomen and pelvis was performed using the standard protocol following bolus administration of intravenous contrast. CONTRAST:  160mL OMNIPAQUE IOHEXOL 300 MG/ML  SOLN COMPARISON:  05/19/2018 FINDINGS: Lower chest:  No contributory findings. Hepatobiliary: No focal liver abnormality.No evidence of biliary obstruction or stone. Pancreas: Upper normal main pancreatic duct diameter at 3 mm, also prominent before and no obstructive process is seen. Spleen: Unremarkable. Adrenals/Urinary Tract: Negative adrenals. No hydronephrosis or stone. Duplicated left urinary collecting system. There is scarring of the upper pole moiety with diffuse ureteral dilatation that is currently collapsed. A ureterocele was seen on prior imaging, not seen today. Unremarkable bladder. Stomach/Bowel: There is new mild fat stranding anterior to the ascending colon without clear underlying bowel wall thickening. There are a few diverticula in this region without discrete thickened diverticulum. The appendix extends inferiorly from the cecum and shows no wall thickening or mesenteric edema. Vascular/Lymphatic: No acute vascular abnormality. Mild atherosclerotic calcification of the aorta. No mass or adenopathy. Reproductive:Negative Other: No ascites  or pneumoperitoneum. Musculoskeletal: No acute abnormalities. Degenerative facet spurring. IMPRESSION: 1. Mild fat stranding along the ascending colon that is new from yesterday. No definitive source, question diverticulitis as there are diverticula in this region. An omental infarct is considered but given prominent leukocytosis infection seems more likely. No inflammation seen around the appendix. 2. 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 No evident active renal infection. Electronically Signed   By: Monte Fantasia M.D.   On: 05/20/2018 04:58    Medications:   . amoxicillin-clavulanate  1 tablet Oral Q12H  . enoxaparin (LOVENOX) injection  40 mg Subcutaneous Q24H  . metoprolol tartrate  25 mg Oral QHS  . metoprolol tartrate  50 mg Oral Daily  . potassium chloride  40 mEq Oral Once   Continuous Infusions: . sodium chloride 50 mL/hr at 05/21/18 0854     LOS: 0 days   Geradine Girt  [facility]   *Please refer to [email or web address], password [email or web address] to get updated schedule on who will round on this patient, as hospitalists switch teams weekly. If 7PM-7AM, please contact night-coverage at [email or web address], password [email or web address] for any overnight  needs.  05/21/2018, 9:29 AM

## 2018-05-21 NOTE — Progress Notes (Signed)
Pt feeling a little unsettled after eating the jello.

## 2018-05-22 DIAGNOSIS — R002 Palpitations: Secondary | ICD-10-CM

## 2018-05-22 LAB — CBC
HCT: 33.7 % — ABNORMAL LOW (ref 36.0–46.0)
HEMOGLOBIN: 11 g/dL — AB (ref 12.0–15.0)
MCH: 29 pg (ref 26.0–34.0)
MCHC: 32.6 g/dL (ref 30.0–36.0)
MCV: 88.9 fL (ref 80.0–100.0)
Platelets: 324 10*3/uL (ref 150–400)
RBC: 3.79 MIL/uL — ABNORMAL LOW (ref 3.87–5.11)
RDW: 12.8 % (ref 11.5–15.5)
WBC: 7.4 10*3/uL (ref 4.0–10.5)
nRBC: 0 % (ref 0.0–0.2)

## 2018-05-22 LAB — MAGNESIUM: MAGNESIUM: 2.1 mg/dL (ref 1.7–2.4)

## 2018-05-22 LAB — BASIC METABOLIC PANEL
Anion gap: 9 (ref 5–15)
BUN: 10 mg/dL (ref 6–20)
CO2: 25 mmol/L (ref 22–32)
Calcium: 9 mg/dL (ref 8.9–10.3)
Chloride: 105 mmol/L (ref 98–111)
Creatinine, Ser: 1.14 mg/dL — ABNORMAL HIGH (ref 0.44–1.00)
GFR calc non Af Amer: 53 mL/min — ABNORMAL LOW (ref >60)
Glucose, Bld: 85 mg/dL (ref 70–99)
Potassium: 3.4 mmol/L — ABNORMAL LOW (ref 3.5–5.1)
Sodium: 139 mmol/L (ref 135–145)

## 2018-05-22 MED ORDER — POTASSIUM CHLORIDE CRYS ER 20 MEQ PO TBCR
40.0000 meq | EXTENDED_RELEASE_TABLET | ORAL | Status: AC
  Administered 2018-05-22 (×2): 40 meq via ORAL
  Filled 2018-05-22 (×2): qty 2

## 2018-05-22 NOTE — Progress Notes (Signed)
PROGRESS NOTE    Elisabetta Mishra   LGX:[id number]  DOB: 02-23-1961  DOA: 05/19/2018 PCP: [name], MD   Brief Narrative:  Patrisia Faeth is a 58 y.o. female with medical history significant of GERD, hypertension, hyperlipidemia who presents to the hospital with 2 days of abdominal pain.  CT scan on 1/22 >  Mild fat stranding along the ascending colon that is new from yesterday. No definitive source, question diverticulitis as there are diverticula in this region. An omental infarct is considered but given prominent leukocytosis infection seems more likely. No inflammation seen around the appendix.  Subjective: Still has pain in left lower abdomen. Only tolerating clears and not taking in much. Has had multiple episodes of diarrhea.    Assessment & Plan:   Principal Problem:   Acute diverticulitis -  changed to Augmentin yesterday- cont clears and continue IVF  Active Problems:     Essential hypertension h/o palpitations - follow with cardiology - Chlorthalidone and Cardizem on hold - receiving Metoprolol - BP on low side- follow     AKI (acute kidney injury)  - Cr is improving  Morbid obesity Body mass index is 34.17 kg/m.    Time spent in minutes: 35 DVT prophylaxis: Lovenox Code Status: Full code Family Communication:  Disposition Plan: home Consultants:   GI Procedures:   EGD Antimicrobials:  Anti-infectives (From admission, onward)   Start     Dose/Rate Route Frequency Ordered Stop   05/21/18 1000  amoxicillin-clavulanate (AUGMENTIN) 875-125 MG per tablet 1 tablet     1 tablet Oral Every 12 hours 05/21/18 0847     05/20/18 2100  ampicillin-sulbactam (UNASYN) 1.5 g in sodium chloride 0.9 % 100 mL IVPB  Status:  Discontinued     1.5 g 200 mL/hr over 30 Minutes Intravenous Every 6 hours 05/20/18 1747 05/21/18 0845   05/20/18 1200  ampicillin-sulbactam (UNASYN) 1.5 g in sodium chloride 0.9 % 100 mL IVPB  Status:  Discontinued     1.5 g 200 mL/hr  over 30 Minutes Intravenous Every 6 hours 05/20/18 1027 05/20/18 1747   05/20/18 0530  Ampicillin-Sulbactam (UNASYN) 3 g in sodium chloride 0.9 % 100 mL IVPB     3 g 200 mL/hr over 30 Minutes Intravenous  Once 05/20/18 0511 05/20/18 0739       Objective: Vitals:   05/21/18 2106 05/22/18 0500 05/22/18 0523 05/22/18 0927  BP: 111/60  121/66 112/77  Pulse: 72  71 75  Resp: 16  16   Temp: 98.6 F (37 C)  98.8 F (37.1 C)   TempSrc: Oral  Oral   SpO2: 97%  98%   Weight:  87.5 kg    Height:        Intake/Output Summary (Last 24 hours) at 05/22/2018 1449 Last data filed at 05/22/2018 1000 Gross per 24 hour  Intake 2145.41 ml  Output -  Net 2145.41 ml   Filed Weights   05/20/18 1309 05/21/18 0257 05/22/18 0500  Weight: 88 kg 89.8 kg 87.5 kg    Examination: General exam: Appears comfortable  HEENT: PERRLA, oral mucosa moist, no sclera icterus or thrush Respiratory system: Clear to auscultation. Respiratory effort normal. Cardiovascular system: S1 & S2 heard, RRR.   Gastrointestinal system: Abdomen soft, tender in RLQ, nondistended. Normal bowel sounds. Central nervous system: Alert and oriented. No focal neurological deficits. Extremities: No cyanosis, clubbing or edema Skin: No rashes or ulcers Psychiatry:  Mood & affect appropriate.     Data Reviewed: I have personally  reviewed following labs and imaging studies  CBC: Recent Labs  Lab 05/20/18 0008 05/21/18 0245 05/22/18 0452  WBC 26.9* 12.4* 7.4  HGB 12.6 10.6* 11.0*  HCT 39.4 31.7* 33.7*  MCV 90.6 89.5 88.9  PLT 333 278 295   Basic Metabolic Panel: Recent Labs  Lab 05/20/18 0008 05/21/18 0245 05/21/18 0907 05/22/18 0452  NA 137 137  --  139  K 3.6 3.0*  --  3.4*  CL 101 102  --  105  CO2 24 25  --  25  GLUCOSE 119* 79  --  85  BUN 16 17  --  10  CREATININE 1.44* 1.38*  --  1.14*  CALCIUM 9.8 8.9  --  9.0  MG  --   --  2.0 2.1   GFR: Estimated Creatinine Clearance: 57.1 mL/min (A) (by C-G  formula based on SCr of 1.14 mg/dL (H)). Liver Function Tests: Recent Labs  Lab 05/20/18 0008  AST 19  ALT 12  ALKPHOS 49  BILITOT 1.2  PROT 7.6  ALBUMIN 3.7   Recent Labs  Lab 05/20/18 0008  LIPASE 23   No results for input(s): AMMONIA in the last 168 hours. Coagulation Profile: No results for input(s): INR, PROTIME in the last 168 hours. Cardiac Enzymes: No results for input(s): CKTOTAL, CKMB, CKMBINDEX, TROPONINI in the last 168 hours. BNP (last 3 results) No results for input(s): PROBNP in the last 8760 hours. HbA1C: No results for input(s): HGBA1C in the last 72 hours. CBG: No results for input(s): GLUCAP in the last 168 hours. Lipid Profile: No results for input(s): CHOL, HDL, LDLCALC, TRIG, CHOLHDL, LDLDIRECT in the last 72 hours. Thyroid Function Tests: No results for input(s): TSH, T4TOTAL, FREET4, T3FREE, THYROIDAB in the last 72 hours. Anemia Panel: No results for input(s): VITAMINB12, FOLATE, FERRITIN, TIBC, IRON, RETICCTPCT in the last 72 hours. Urine analysis:    Component Value Date/Time   COLORURINE YELLOW 05/20/2018 [place] 05/20/2018 0547   APPEARANCEUR Clear 03/12/2016 1619   LABSPEC 1.028 05/20/2018 0547   PHURINE 5.0 05/20/2018 0547   GLUCOSEU NEGATIVE 05/20/2018 0547   HGBUR SMALL (A) 05/20/2018 0547   BILIRUBINUR NEGATIVE 05/20/2018 0547   BILIRUBINUR Negative 03/12/2016 1619   KETONESUR NEGATIVE 05/20/2018 0547   PROTEINUR NEGATIVE 05/20/2018 0547   UROBILINOGEN 0.2 08/30/2012 1339   NITRITE NEGATIVE 05/20/2018 0547   LEUKOCYTESUR SMALL (A) 05/20/2018 0547   LEUKOCYTESUR 2+ (A) 03/12/2016 1619   Sepsis Labs: @LABRCNTIP (procalcitonin:4,lacticidven:4) )No results found for this or any previous visit (from the past 240 hour(s)).       Radiology Studies: No results found.    Scheduled Meds: . amoxicillin-clavulanate  1 tablet Oral Q12H  . enoxaparin (LOVENOX) injection  40 mg Subcutaneous Q24H  . metoprolol  tartrate  25 mg Oral QHS  . metoprolol tartrate  50 mg Oral Daily   Continuous Infusions: . sodium chloride 50 mL/hr at 05/22/18 1437     LOS: 1 day      Debbe Odea, MD [facility] Pager: [email or web address] Password [facility] 05/22/2018, 2:49 PM

## 2018-05-23 MED ORDER — DIPHENOXYLATE-ATROPINE 2.5-0.025 MG PO TABS
1.0000 | ORAL_TABLET | Freq: Four times a day (QID) | ORAL | Status: DC | PRN
  Administered 2018-05-23 – 2018-05-24 (×2): 1 via ORAL
  Filled 2018-05-23 (×2): qty 1

## 2018-05-23 NOTE — Progress Notes (Addendum)
PROGRESS NOTE    Zophia Marrone   VEH:[id number]  DOB: [birth year]  DOA: 05/19/2018 PCP: [name], MD   Brief Narrative:  Deadra Diggins is a 58 y.o. female with medical history significant of GERD, hypertension, hyperlipidemia who presents to the hospital with 2 days of abdominal pain.  CT scan on 1/22 >  Mild fat stranding along the ascending colon that is new from yesterday. No definitive source, question diverticulitis as there are diverticula in this region. An omental infarct is considered but given prominent leukocytosis infection seems more likely. No inflammation seen around the appendix.  Subjective: Has had more diarrhea but not vomiting. Abdominal pain persists. We discussed her cardiac meds and palpitations. She states that she felt the Cardizem was dropping her heart rate too low and causing her to feel weak.     Assessment & Plan:   Principal Problem:   Acute diverticulitis -  changed to Augmentin  - advance to soft diet today-   Diarrhea - ? Due to Augmentin- she also notes it is occurring when she has oily food - order Lomotil    Essential hypertension h/o palpitations - follows with cardiology, Dr Harl Bowie - Chlorthalidone and Cardizem on hold for now due to low BP - receiving Metoprolol -      AKI (acute kidney injury)  - Cr is improving  Morbid obesity Body mass index is 34.29 kg/m.    Time spent in minutes: 35 DVT prophylaxis: Lovenox Code Status: Full code Family Communication:  Disposition Plan: home if able to tolerate soft food and diarrhea improves Consultants:   GI Procedures:   EGD Antimicrobials:  Anti-infectives (From admission, onward)   Start     Dose/Rate Route Frequency Ordered Stop   05/21/18 1000  amoxicillin-clavulanate (AUGMENTIN) 875-125 MG per tablet 1 tablet     1 tablet Oral Every 12 hours 05/21/18 0847     05/20/18 2100  ampicillin-sulbactam (UNASYN) 1.5 g in sodium chloride 0.9 % 100 mL IVPB  Status:   Discontinued     1.5 g 200 mL/hr over 30 Minutes Intravenous Every 6 hours 05/20/18 1747 05/21/18 0845   05/20/18 1200  ampicillin-sulbactam (UNASYN) 1.5 g in sodium chloride 0.9 % 100 mL IVPB  Status:  Discontinued     1.5 g 200 mL/hr over 30 Minutes Intravenous Every 6 hours 05/20/18 1027 05/20/18 1747   05/20/18 0530  Ampicillin-Sulbactam (UNASYN) 3 g in sodium chloride 0.9 % 100 mL IVPB     3 g 200 mL/hr over 30 Minutes Intravenous  Once 05/20/18 0511 05/20/18 0739       Objective: Vitals:   05/22/18 1504 05/22/18 2141 05/23/18 0455 05/23/18 0500  BP: 135/86 120/67 130/70   Pulse: 67 64 61   Resp: 18  17   Temp: 98.3 F (36.8 C) 97.7 F (36.5 C) 98.6 F (37 C)   TempSrc: Oral Oral Oral   SpO2: 100% 98% 99%   Weight:    87.8 kg  Height:        Intake/Output Summary (Last 24 hours) at 05/23/2018 1435 Last data filed at 05/23/2018 0900 Gross per 24 hour  Intake 2173.78 ml  Output 1600 ml  Net 573.78 ml   Filed Weights   05/21/18 0257 05/22/18 0500 05/23/18 0500  Weight: 89.8 kg 87.5 kg 87.8 kg    Examination: General exam: Appears comfortable  HEENT: PERRLA, oral mucosa moist, no sclera icterus or thrush Respiratory system: Clear to auscultation. Respiratory effort normal. Cardiovascular system: S1 &  S2 heard,  No murmurs  Gastrointestinal system: Abdomen soft, tender in RLQ and mid abdomen, nondistended. Normal bowel sound. No organomegaly Central nervous system: Alert and oriented. No focal neurological deficits. Extremities: No cyanosis, clubbing or edema Skin: No rashes or ulcers Psychiatry:  Mood & affect appropriate.    Data Reviewed: I have personally reviewed following labs and imaging studies  CBC: Recent Labs  Lab 05/20/18 0008 05/21/18 0245 05/22/18 0452  WBC 26.9* 12.4* 7.4  HGB 12.6 10.6* 11.0*  HCT 39.4 31.7* 33.7*  MCV 90.6 89.5 88.9  PLT 333 278 767   Basic Metabolic Panel: Recent Labs  Lab 05/20/18 0008 05/21/18 0245  05/21/18 0907 05/22/18 0452  NA 137 137  --  139  K 3.6 3.0*  --  3.4*  CL 101 102  --  105  CO2 24 25  --  25  GLUCOSE 119* 79  --  85  BUN 16 17  --  10  CREATININE 1.44* 1.38*  --  1.14*  CALCIUM 9.8 8.9  --  9.0  MG  --   --  2.0 2.1   GFR: Estimated Creatinine Clearance: 57.2 mL/min (A) (by C-G formula based on SCr of 1.14 mg/dL (H)). Liver Function Tests: Recent Labs  Lab 05/20/18 0008  AST 19  ALT 12  ALKPHOS 49  BILITOT 1.2  PROT 7.6  ALBUMIN 3.7   Recent Labs  Lab 05/20/18 0008  LIPASE 23   No results for input(s): AMMONIA in the last 168 hours. Coagulation Profile: No results for input(s): INR, PROTIME in the last 168 hours. Cardiac Enzymes: No results for input(s): CKTOTAL, CKMB, CKMBINDEX, TROPONINI in the last 168 hours. BNP (last 3 results) No results for input(s): PROBNP in the last 8760 hours. HbA1C: No results for input(s): HGBA1C in the last 72 hours. CBG: No results for input(s): GLUCAP in the last 168 hours. Lipid Profile: No results for input(s): CHOL, HDL, LDLCALC, TRIG, CHOLHDL, LDLDIRECT in the last 72 hours. Thyroid Function Tests: No results for input(s): TSH, T4TOTAL, FREET4, T3FREE, THYROIDAB in the last 72 hours. Anemia Panel: No results for input(s): VITAMINB12, FOLATE, FERRITIN, TIBC, IRON, RETICCTPCT in the last 72 hours. Urine analysis:    Component Value Date/Time   COLORURINE YELLOW 05/20/2018 [place] 05/20/2018 0547   APPEARANCEUR Clear 03/12/2016 1619   LABSPEC 1.028 05/20/2018 0547   PHURINE 5.0 05/20/2018 0547   GLUCOSEU NEGATIVE 05/20/2018 0547   HGBUR SMALL (A) 05/20/2018 0547   BILIRUBINUR NEGATIVE 05/20/2018 0547   BILIRUBINUR Negative 03/12/2016 1619   KETONESUR NEGATIVE 05/20/2018 0547   PROTEINUR NEGATIVE 05/20/2018 0547   UROBILINOGEN 0.2 08/30/2012 1339   NITRITE NEGATIVE 05/20/2018 0547   LEUKOCYTESUR SMALL (A) 05/20/2018 0547   LEUKOCYTESUR 2+ (A) 03/12/2016 1619   Sepsis  Labs: @LABRCNTIP (procalcitonin:4,lacticidven:4) )No results found for this or any previous visit (from the past 240 hour(s)).       Radiology Studies: No results found.    Scheduled Meds: . amoxicillin-clavulanate  1 tablet Oral Q12H  . enoxaparin (LOVENOX) injection  40 mg Subcutaneous Q24H  . metoprolol tartrate  25 mg Oral QHS  . metoprolol tartrate  50 mg Oral Daily   Continuous Infusions:    LOS: 2 days      Debbe Odea, MD [facility] Pager: [email or web address] Password [facility] 05/23/2018, 2:35 PM

## 2018-05-23 NOTE — Plan of Care (Signed)
  Problem: Pain Managment: Goal: General experience of comfort will improve Outcome: Progressing   Problem: Safety: Goal: Ability to remain free from injury will improve Outcome: Progressing   Problem: Skin Integrity: Goal: Risk for impaired skin integrity will decrease Outcome: Progressing   

## 2018-05-24 DIAGNOSIS — K210 Gastro-esophageal reflux disease with esophagitis: Secondary | ICD-10-CM

## 2018-05-24 LAB — BASIC METABOLIC PANEL
Anion gap: 11 (ref 5–15)
BUN: 7 mg/dL (ref 6–20)
CO2: 22 mmol/L (ref 22–32)
Calcium: 9.5 mg/dL (ref 8.9–10.3)
Chloride: 105 mmol/L (ref 98–111)
Creatinine, Ser: 1.18 mg/dL — ABNORMAL HIGH (ref 0.44–1.00)
GFR calc Af Amer: 59 mL/min — ABNORMAL LOW (ref >60)
GFR calc non Af Amer: 51 mL/min — ABNORMAL LOW (ref >60)
Glucose, Bld: 88 mg/dL (ref 70–99)
Potassium: 3.9 mmol/L (ref 3.5–5.1)
Sodium: 138 mmol/L (ref 135–145)

## 2018-05-24 MED ORDER — DIPHENOXYLATE-ATROPINE 2.5-0.025 MG PO TABS: 1.0000 | ORAL_TABLET | Freq: Four times a day (QID) | ORAL | 0 refills | Status: DC | PRN

## 2018-05-24 MED ORDER — AMOXICILLIN-POT CLAVULANATE 875-125 MG PO TABS: 1.0000 | ORAL_TABLET | Freq: Two times a day (BID) | ORAL | 0 refills | Status: DC

## 2018-05-24 MED ORDER — ONDANSETRON HCL 4 MG PO TABS: 4.0000 mg | ORAL_TABLET | Freq: Four times a day (QID) | ORAL | 0 refills | Status: DC | PRN

## 2018-05-24 MED ORDER — ACETAMINOPHEN 325 MG PO TABS: 650.0000 mg | ORAL_TABLET | Freq: Four times a day (QID) | ORAL | Status: DC | PRN

## 2018-05-24 NOTE — Discharge Instructions (Signed)
Please discussed resumption of Cardizem and Chlorthalidone with Dr Harl Bowie tomorrow when office opens. Check your BP daily and keep a record to show your PCP.   You were cared for by a hospitalist during your hospital stay. If you have any questions about your discharge medications or the care you received while you were in the hospital after you are discharged, you can call the unit and asked to speak with the hospitalist on call if the hospitalist that took care of you is not available. Once you are discharged, your primary care physician will handle any further medical issues.   Please note that NO REFILLS for any discharge medications will be authorized once you are discharged, as it is imperative that you return to your primary care physician (or establish a relationship with a primary care physician if you do not have one) for your aftercare needs so that they can reassess your need for medications and monitor your lab values.  Please take all your medications with you for your next visit with your Primary MD. Please ask your Primary MD to get all Hospital records sent to his/her office. Please request your Primary MD to go over all hospital test results at the follow up.   If you experience worsening of your admission symptoms, develop shortness of breath, chest pain, suicidal or homicidal thoughts or a life threatening emergency, you must seek medical attention immediately by calling 911 or calling your MD.   Dennis Bast must read the complete instructions/literature along with all the possible adverse reactions/side effects for all the medicines you take including new medications that have been prescribed to you. Take new medicines after you have completely understood and accpet all the possible adverse reactions/side effects.    Do not drive when taking pain medications or sedatives.     Do not take more than prescribed Pain, Sleep and Anxiety Medications   If you have smoked or chewed Tobacco in  the last 2 yrs please stop. Stop any regular alcohol  and or recreational drug use.   Wear Seat belts while driving.

## 2018-05-24 NOTE — Progress Notes (Signed)
Pt refused to have another IV place. States, "I might go home tomorrow, I don`t need it. I am taking oral antibiotics now " On call Bodenheimer,NP notified via text page.

## 2018-05-24 NOTE — Discharge Summary (Signed)
Physician Discharge Summary  [name] [name] YDX:[id number] DOB: 22-Mar-1961 DOA: 05/19/2018  PCP: [name], MD  Admit date: 05/19/2018 Discharge date: 05/24/2018  Admitted From: home Disposition:  home   Recommendations for Outpatient Follow-up:  1. Resume Chlorthalidone and Cardizem as needed     Discharge Condition:  stable   CODE STATUS:  Full code   Diet recommendation:  Soft diet Consultations:  none    Discharge Diagnoses:  Principal Problem:   Acute diverticulitis Active Problems:   AKI (acute kidney injury) ([place])   HLD (hyperlipidemia)   Essential hypertension   GERD (gastroesophageal reflux disease)     Brief Summary: [name] [name] is a 58 y.o.femalewith medical history significant ofGERD, hypertension, hyperlipidemia who presents to the hospital with2days of abdominal pain.  CT scan on 1/22 > Mild fat stranding along the ascending colon that is new from yesterday. No definitive source, question diverticulitis as there are diverticula in this region. An omental infarct is considered but given prominent leukocytosis infection seems more likely. No inflammation seen around the appendix.  Hospital Course:  Principal Problem:   Acute diverticulitis, leukocytosis -  changed to Augmentin on 1/23  - advanced to soft diet yesterday which she is tolerating without nausea or increase in pain.   - WBC count 26.9 on admission has improved to 7.4   Diarrhea - likely ue to Augmentin- she is allergic to Cipro and would like to continue Augmentin for treatment - I have ordered Lomotil PRN    Essential hypertension h/o palpitations - follows with cardiology, Dr Harl Bowie - Chlorthalidone and Cardizem was on hold for due to low BP and she was receiving Metoprolol only- she feels that holding the Cardizem has allowed her HR to come up (would drop in to 40s previously) and she feels better without it- no palpitations thus far -  she would like to talk to Dr Harl Bowie  to discontinue it.  - will cont to hold both Cardizem and Chlorthalidone for now      AKI (acute kidney injury)  - Cr has improved from 1.44 to 1.18  Morbid obesity Body mass index is 34.29 kg/m.   Discharge Exam: Vitals:   05/23/18 2016 05/24/18 0529  BP: 136/64 134/77  Pulse: 62 (!) 56  Resp: 20 18  Temp: 98.1 F (36.7 C) 98.6 F (37 C)  SpO2: 100% 100%   Vitals:   05/23/18 0455 05/23/18 0500 05/23/18 2016 05/24/18 0529  BP: 130/70  136/64 134/77  Pulse: 61  62 (!) 56  Resp: 17  20 18   Temp: 98.6 F (37 C)  98.1 F (36.7 C) 98.6 F (37 C)  TempSrc: Oral  Oral Oral  SpO2: 99%  100% 100%  Weight:  87.8 kg    Height:        General: Pt is alert, awake, not in acute distress Cardiovascular: RRR, S1/S2 +, no rubs, no gallops Respiratory: CTA bilaterally, no wheezing, no rhonchi Abdominal: Soft, mild tenderness in RLQ, bowel sounds + Extremities: no edema, no cyanosis   Discharge Instructions  Discharge Instructions    Diet - low sodium heart healthy   Complete by:  As directed    Increase activity slowly   Complete by:  As directed      Allergies as of 05/24/2018      Reactions   Ace Inhibitors Swelling   Azithromycin Anaphylaxis   Breathing difficulty, swelling   Ciprofloxacin Nausea And Vomiting   Also: kidney failure   Other Swelling, Other (  See Comments), Cough   Sneezing allergy to cats/grass/dust   Statins Other (See Comments)   Cramping, muscle pain   Medrol [methylprednisolone] Palpitations   Heart racing, increased BP   Tetracyclines & Related Palpitations   Heart racing, increased bp      Medication List    STOP taking these medications   acetaminophen 650 MG CR tablet Commonly known as:  TYLENOL Replaced by:  acetaminophen 325 MG tablet     TAKE these medications   acetaminophen 325 MG tablet Commonly known as:  TYLENOL Take 2 tablets (650 mg total) by mouth every 6 (six) hours as needed for mild pain (or Fever >/=  101). Replaces:  acetaminophen 650 MG CR tablet   amoxicillin-clavulanate 875-125 MG tablet Commonly known as:  AUGMENTIN Take 1 tablet by mouth every 12 (twelve) hours.   aspirin EC 81 MG tablet Take 81 mg by mouth daily.   chlorthalidone 25 MG tablet Commonly known as:  HYGROTON Take 0.5 tablets (12.5 mg total) by mouth daily.   diltiazem 180 MG 24 hr capsule Commonly known as:  CARDIZEM CD Take 1 capsule (180 mg total) by mouth daily. What changed:  when to take this   diphenoxylate-atropine 2.5-0.025 MG tablet Commonly known as:  LOMOTIL Take 1 tablet by mouth 4 (four) times daily as needed for diarrhea or loose stools.   loratadine 10 MG tablet Commonly known as:  CLARITIN Take 10 mg by mouth daily.   metoprolol tartrate 50 MG tablet Commonly known as:  LOPRESSOR Take 1 tablet (50 mg total) by mouth 2 (two) times daily. What changed:    how much to take  when to take this  additional instructions   ondansetron 4 MG tablet Commonly known as:  ZOFRAN Take 1 tablet (4 mg total) by mouth every 6 (six) hours as needed for nausea.   PROAIR HFA 108 (90 Base) MCG/ACT inhaler Generic drug:  albuterol Inhale 1-2 puffs into the lungs every 4 (four) hours as needed for wheezing or shortness of breath.       Allergies  Allergen Reactions  . Ace Inhibitors Swelling  . Azithromycin Anaphylaxis    Breathing difficulty, swelling  . Ciprofloxacin Nausea And Vomiting    Also: kidney failure   . Other Swelling, Other (See Comments) and Cough    Sneezing allergy to cats/grass/dust  . Statins Other (See Comments)    Cramping, muscle pain  . Medrol [Methylprednisolone] Palpitations    Heart racing, increased BP  . Tetracyclines & Related Palpitations    Heart racing, increased bp     Procedures/Studies:    Ct Abdomen Pelvis W Contrast  Result Date: 05/20/2018 CLINICAL DATA:  Right lower quadrant pain with nausea and vomiting that began yesterday EXAM: CT ABDOMEN  AND PELVIS WITH CONTRAST TECHNIQUE: Multidetector CT imaging of the abdomen and pelvis was performed using the standard protocol following bolus administration of intravenous contrast. CONTRAST:  132mL OMNIPAQUE IOHEXOL 300 MG/ML  SOLN COMPARISON:  05/19/2018 FINDINGS: Lower chest:  No contributory findings. Hepatobiliary: No focal liver abnormality.No evidence of biliary obstruction or stone. Pancreas: Upper normal main pancreatic duct diameter at 3 mm, also prominent before and no obstructive process is seen. Spleen: Unremarkable. Adrenals/Urinary Tract: Negative adrenals. No hydronephrosis or stone. Duplicated left urinary collecting system. There is scarring of the upper pole moiety with diffuse ureteral dilatation that is currently collapsed. A ureterocele was seen on prior imaging, not seen today. Unremarkable bladder. Stomach/Bowel: There is new mild fat stranding anterior  to the ascending colon without clear underlying bowel wall thickening. There are a few diverticula in this region without discrete thickened diverticulum. The appendix extends inferiorly from the cecum and shows no wall thickening or mesenteric edema. Vascular/Lymphatic: No acute vascular abnormality. Mild atherosclerotic calcification of the aorta. No mass or adenopathy. Reproductive:Negative Other: No ascites or pneumoperitoneum. Musculoskeletal: No acute abnormalities. Degenerative facet spurring. IMPRESSION: 1. Mild fat stranding along the ascending colon that is new from yesterday. No definitive source, question diverticulitis as there are diverticula in this region. An omental infarct is considered but given prominent leukocytosis infection seems more likely. No inflammation seen around the appendix. 2. 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 No evident active renal infection. Electronically Signed   By: Monte Fantasia M.D.   On: 05/20/2018 04:58     The results of significant diagnostics from  this hospitalization (including imaging, microbiology, ancillary and laboratory) are listed below for reference.     Microbiology: No results found for this or any previous visit (from the past 240 hour(s)).   Labs: BNP (last 3 results) No results for input(s): BNP in the last 8760 hours. Basic Metabolic Panel: Recent Labs  Lab 05/20/18 0008 05/21/18 0245 05/21/18 0907 05/22/18 0452 05/24/18 0515  NA 137 137  --  139 138  K 3.6 3.0*  --  3.4* 3.9  CL 101 102  --  105 105  CO2 24 25  --  25 22  GLUCOSE 119* 79  --  85 88  BUN 16 17  --  10 7  CREATININE 1.44* 1.38*  --  1.14* 1.18*  CALCIUM 9.8 8.9  --  9.0 9.5  MG  --   --  2.0 2.1  --    Liver Function Tests: Recent Labs  Lab 05/20/18 0008  AST 19  ALT 12  ALKPHOS 49  BILITOT 1.2  PROT 7.6  ALBUMIN 3.7   Recent Labs  Lab 05/20/18 0008  LIPASE 23   No results for input(s): AMMONIA in the last 168 hours. CBC: Recent Labs  Lab 05/20/18 0008 05/21/18 0245 05/22/18 0452  WBC 26.9* 12.4* 7.4  HGB 12.6 10.6* 11.0*  HCT 39.4 31.7* 33.7*  MCV 90.6 89.5 88.9  PLT 333 278 324   Cardiac Enzymes: No results for input(s): CKTOTAL, CKMB, CKMBINDEX, TROPONINI in the last 168 hours. BNP: Invalid input(s): POCBNP CBG: No results for input(s): GLUCAP in the last 168 hours. D-Dimer No results for input(s): DDIMER in the last 72 hours. Hgb A1c No results for input(s): HGBA1C in the last 72 hours. Lipid Profile No results for input(s): CHOL, HDL, LDLCALC, TRIG, CHOLHDL, LDLDIRECT in the last 72 hours. Thyroid function studies No results for input(s): TSH, T4TOTAL, T3FREE, THYROIDAB in the last 72 hours.  Invalid input(s): FREET3 Anemia work up No results for input(s): VITAMINB12, FOLATE, FERRITIN, TIBC, IRON, RETICCTPCT in the last 72 hours. Urinalysis    Component Value Date/Time   COLORURINE YELLOW 05/20/2018 [place] 05/20/2018 0547   APPEARANCEUR Clear 03/12/2016 1619   LABSPEC 1.028  05/20/2018 0547   PHURINE 5.0 05/20/2018 0547   GLUCOSEU NEGATIVE 05/20/2018 0547   HGBUR SMALL (A) 05/20/2018 0547   BILIRUBINUR NEGATIVE 05/20/2018 0547   BILIRUBINUR Negative 03/12/2016 [place] 05/20/2018 0547   PROTEINUR NEGATIVE 05/20/2018 0547   UROBILINOGEN 0.2 08/30/2012 1339   NITRITE NEGATIVE 05/20/2018 0547   LEUKOCYTESUR SMALL (A) 05/20/2018 0547   LEUKOCYTESUR 2+ (A) 03/12/2016  73   Sepsis Labs Invalid input(s): PROCALCITONIN,  WBC,  LACTICIDVEN Microbiology No results found for this or any previous visit (from the past 240 hour(s)).   Time coordinating discharge in minutes: 65  SIGNED:   Debbe Odea, MD  [facility] 05/24/2018, 8:23 AM Pager   If 7PM-7AM, please contact night-coverage [email or web address] Password [email or web address]

## 2018-05-24 NOTE — Progress Notes (Signed)
D/c to home.  D/c instructions and medication reviewed and pt demonstrated understanding. VSS pain 5/10 medication given for d/c to home. Breathing regular and unlabored and no nausea

## 2018-05-25 ENCOUNTER — Telehealth: Payer: Self-pay | Admitting: Cardiology

## 2018-05-25 NOTE — Telephone Encounter (Signed)
Can stay off for now, can she f/u with me in 4 weeks  [name] MD

## 2018-05-25 NOTE — Telephone Encounter (Signed)
Patient informed and verbalized understanding of plan. 

## 2018-05-25 NOTE — Telephone Encounter (Signed)
Patient was recently in [facility] and they took her off all medication except metoprolol tartrate (LOPRESSOR) 50 MG tablet.  Advised her to call and see what Dr Harl Bowie wanted her to do about her medications  She said that her BP has been rising since she has been home

## 2018-05-25 NOTE — Telephone Encounter (Signed)
D/C yesterday afternoon from [facility] and was told to contact her cardiologist regarding when to start her diltiazem and chlorthalidone. Her BP has been 141/95 & 151/99 and HR 64 & 65.

## 2018-05-27 ENCOUNTER — Other Ambulatory Visit: Payer: Self-pay

## 2018-05-27 ENCOUNTER — Emergency Department (HOSPITAL_COMMUNITY): Payer: BLUE CROSS/BLUE SHIELD

## 2018-05-27 ENCOUNTER — Encounter (HOSPITAL_COMMUNITY): Payer: Self-pay

## 2018-05-27 ENCOUNTER — Emergency Department (HOSPITAL_COMMUNITY)
Admission: EM | Admit: 2018-05-27 | Discharge: 2018-05-28 | Disposition: A | Discharge status: Home / Self Care | Payer: BLUE CROSS/BLUE SHIELD | Attending: Emergency Medicine | Admitting: Emergency Medicine

## 2018-05-27 DIAGNOSIS — N189 Chronic kidney disease, unspecified: Secondary | ICD-10-CM | POA: Diagnosis not present

## 2018-05-27 DIAGNOSIS — Z9889 Other specified postprocedural states: Secondary | ICD-10-CM | POA: Diagnosis not present

## 2018-05-27 DIAGNOSIS — R51 Headache: Secondary | ICD-10-CM | POA: Diagnosis not present

## 2018-05-27 DIAGNOSIS — Z87891 Personal history of nicotine dependence: Secondary | ICD-10-CM | POA: Diagnosis not present

## 2018-05-27 DIAGNOSIS — I129 Hypertensive chronic kidney disease with stage 1 through stage 4 chronic kidney disease, or unspecified chronic kidney disease: Secondary | ICD-10-CM | POA: Insufficient documentation

## 2018-05-27 DIAGNOSIS — R52 Pain, unspecified: Secondary | ICD-10-CM | POA: Diagnosis not present

## 2018-05-27 DIAGNOSIS — Z8679 Personal history of other diseases of the circulatory system: Secondary | ICD-10-CM | POA: Diagnosis not present

## 2018-05-27 DIAGNOSIS — R197 Diarrhea, unspecified: Secondary | ICD-10-CM

## 2018-05-27 DIAGNOSIS — R110 Nausea: Secondary | ICD-10-CM | POA: Diagnosis not present

## 2018-05-27 DIAGNOSIS — R519 Headache, unspecified: Secondary | ICD-10-CM

## 2018-05-27 DIAGNOSIS — R1031 Right lower quadrant pain: Secondary | ICD-10-CM | POA: Diagnosis not present

## 2018-05-27 DIAGNOSIS — I959 Hypotension, unspecified: Secondary | ICD-10-CM | POA: Diagnosis not present

## 2018-05-27 DIAGNOSIS — I10 Essential (primary) hypertension: Secondary | ICD-10-CM | POA: Diagnosis not present

## 2018-05-27 LAB — CBC WITH DIFFERENTIAL/PLATELET
Abs Immature Granulocytes: 0.06 10*3/uL (ref 0.00–0.07)
Basophils Absolute: 0 10*3/uL (ref 0.0–0.1)
Basophils Relative: 0 %
EOS ABS: 0.1 10*3/uL (ref 0.0–0.5)
Eosinophils Relative: 2 %
HCT: 40.1 % (ref 36.0–46.0)
Hemoglobin: 12.8 g/dL (ref 12.0–15.0)
IMMATURE GRANULOCYTES: 1 %
Lymphocytes Relative: 21 %
Lymphs Abs: 1.8 10*3/uL (ref 0.7–4.0)
MCH: 29 pg (ref 26.0–34.0)
MCHC: 31.9 g/dL (ref 30.0–36.0)
MCV: 90.9 fL (ref 80.0–100.0)
Monocytes Absolute: 0.6 10*3/uL (ref 0.1–1.0)
Monocytes Relative: 7 %
NEUTROS PCT: 69 %
NRBC: 0 % (ref 0.0–0.2)
Neutro Abs: 5.9 10*3/uL (ref 1.7–7.7)
Platelets: 420 10*3/uL — ABNORMAL HIGH (ref 150–400)
RBC: 4.41 MIL/uL (ref 3.87–5.11)
RDW: 13 % (ref 11.5–15.5)
WBC: 8.5 10*3/uL (ref 4.0–10.5)

## 2018-05-27 LAB — BASIC METABOLIC PANEL
Anion gap: 12 (ref 5–15)
BUN: 10 mg/dL (ref 6–20)
CHLORIDE: 103 mmol/L (ref 98–111)
CO2: 24 mmol/L (ref 22–32)
Calcium: 10.3 mg/dL (ref 8.9–10.3)
Creatinine, Ser: 1.09 mg/dL — ABNORMAL HIGH (ref 0.44–1.00)
GFR calc Af Amer: >60 mL/min (ref >60)
GFR calc non Af Amer: 56 mL/min — ABNORMAL LOW (ref >60)
Glucose, Bld: 98 mg/dL (ref 70–99)
Potassium: 4.1 mmol/L (ref 3.5–5.1)
Sodium: 139 mmol/L (ref 135–145)

## 2018-05-27 MED ORDER — METOCLOPRAMIDE HCL 5 MG/ML IJ SOLN
10.0000 mg | Freq: Once | INTRAMUSCULAR | Status: AC
  Administered 2018-05-27: 10 mg via INTRAVENOUS
  Filled 2018-05-27: qty 2

## 2018-05-27 MED ORDER — OXYCODONE-ACETAMINOPHEN 5-325 MG PO TABS
1.0000 | ORAL_TABLET | Freq: Once | ORAL | Status: AC
  Administered 2018-05-28: 1 via ORAL
  Filled 2018-05-27: qty 1

## 2018-05-27 MED ORDER — DIPHENHYDRAMINE HCL 50 MG/ML IJ SOLN
25.0000 mg | Freq: Once | INTRAMUSCULAR | Status: AC
  Administered 2018-05-27: 25 mg via INTRAVENOUS
  Filled 2018-05-27: qty 1

## 2018-05-27 MED ORDER — SODIUM CHLORIDE 0.9 % IV BOLUS
1000.0000 mL | Freq: Once | INTRAVENOUS | Status: AC
  Administered 2018-05-27: 1000 mL via INTRAVENOUS

## 2018-05-27 MED ORDER — LOPERAMIDE HCL 2 MG PO CAPS
2.0000 mg | ORAL_CAPSULE | Freq: Once | ORAL | Status: AC
  Administered 2018-05-28: 2 mg via ORAL
  Filled 2018-05-27: qty 1

## 2018-05-27 MED ORDER — ONDANSETRON HCL 4 MG/2ML IJ SOLN
4.0000 mg | Freq: Once | INTRAMUSCULAR | Status: AC
  Administered 2018-05-27: 4 mg via INTRAVENOUS
  Filled 2018-05-27: qty 2

## 2018-05-27 NOTE — ED Triage Notes (Signed)
[facility] reports pt laid down at 5:30 pm and woke up at 7:30 with the worst headache ever and nausea. She had a aneurysm clipped 6 yrs ago. Pt has taken her bp med. BP 150/80

## 2018-05-27 NOTE — ED Notes (Signed)
IV team at bedside 

## 2018-05-27 NOTE — ED Provider Notes (Signed)
[facility] EMERGENC[facility] DEPARTMENT Provider Note   CSN: [id number] Arrival date & time: 05/27/18  2030     History   Chief Complaint Chief Complaint  Patient presents with  . Headache    HPI [name] [name] is a 58 y.o. female past medical history of brain aneurysm, GERD, fibromyalgia, hyperlipidemia, hypertension who presents for evaluation of headache that began approximately 7 PM this evening.  Patient reports that she laid down to take a nap at about 5:30 PM.  She reports she woke up somewhere between 7:15-7:30 with a headache.  She states that this feels different than her typical headaches.  She states that headache was sudden at onset and was maximal intensity at onset.  She denies any gradual headache.  She denies trauma, injury, fall.  Patient reports that she was able to get up and felt like she had to get bathroom.  She reports that she had one bowel movement states that he felt her pain worse.  Reports some nausea but no vomiting.  Patient reports that she has not tried any medications for the pain.  Patient reports she is recently been sick with some right lower quadrant abdominal pain but denies any fevers.  Patient states she is not having any vision changes, numbness/weakness of her arms or legs, chest pain, difficulty breathing.  The history is provided by the patient.    Past Medical History:  Diagnosis Date  . Aneurysm ([place])    brain  . Chest pain    denied on 06/18/2012  . Chronic kidney disease    L - upper pole- defect, doesn't effect     . Family history of anesthesia complication    brother & neice "flat lined" during induction: neice d/t a medication given for a blood clotting problem; brother d/t "miscalculated amount of anesthesia"  . Fibromyalgia   . GERD (gastroesophageal reflux disease)    h/o colitis   . H. pylori infection    2012- stomach biopsy  . H/O hiatal hernia   . Headache(784.0)    using tylenol prn  . Hyperlipidemia   .  Hypertension   . Hypoglycemia   . Normal cardiac stress test    pt. released fr. Dr. [name] care in 10/2011, all findings w/i normal limits  . Overweight(278.02)    Obesity  . Palpitations   . Sinusitis, acute    seen by PCP- 06/17/2012, will treat /w augmentin & tussin/DM  . Uterine fibroid    pt. states that she has a "closed cervix"     Patient Active Problem List   Diagnosis Date Noted  . Acute diverticulitis 05/20/2018  . AKI (acute kidney injury) ([place]) 05/20/2018  . Neck pain 12/02/2016  . Bilateral occipital neuralgia 12/02/2016  . Cervical neuralgia 03/25/2016  . Tension headache 03/25/2016  . Chest tightness or pressure 04/03/2014  . Dizziness 04/03/2014  . Headache 04/03/2014  . Chest pain 04/02/2014  . GERD (gastroesophageal reflux disease) 04/02/2014  . Meningitis, unspecified(322.9) 08/11/2012  . 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 HLD (hyperlipidemia) 01/05/2009  . OVERWEIGHT/OBESITY 01/05/2009  . Essential hypertension 01/05/2009  . Palpitations 01/05/2009  . CHEST PAIN-UNSPECIFIED 01/05/2009    Past Surgical History:  Procedure Laterality Date  . COLONOSCOPY    . CRANIOTOMY Right 06/24/2012   Procedure: CRANIOTOMY INTRACRANIAL ANEURYSM FOR CAROTID;  Surgeon: [name], MD;  Location: [facility];  Service: Neurosurgery;  Laterality: Right;  RIGHT Pterional Craniotomy for aneurysm  . LIPOMA EXCISION  buttock 10/2010     OB History   No obstetric history on file.      Home Medications    Prior to Admission medications   Medication Sig Start Date End Date Taking? Authorizing Provider  acetaminophen (TYLENOL) 325 MG tablet Take 2 tablets (650 mg total) by mouth every 6 (six) hours as needed for mild pain (or Fever >/= 101). 05/24/18   Debbe Odea, MD  albuterol (PROAIR HFA) 108 (90 Base) MCG/ACT inhaler Inhale 1-2 puffs into the lungs every 4 (four) hours as needed for wheezing or shortness of breath.  06/06/15   [provider]  amoxicillin-clavulanate (AUGMENTIN) 875-125 MG tablet Take 1 tablet by mouth every 12 (twelve) hours. 05/24/18   Debbe Odea, MD  aspirin EC 81 MG tablet Take 81 mg by mouth daily.     [provider]  diphenoxylate-atropine (LOMOTIL) 2.5-0.025 MG tablet Take 1 tablet by mouth 4 (four) times daily as needed for diarrhea or loose stools. 05/24/18   Debbe Odea, MD  loratadine (CLARITIN) 10 MG tablet Take 10 mg by mouth daily.     [provider]  metoprolol tartrate (LOPRESSOR) 50 MG tablet Take 1 tablet (50 mg total) by mouth 2 (two) times daily. Patient taking differently: Take 25-50 mg by mouth See admin instructions. Take one tab by mouth every morning, 1/2 tab every evening, & the other 1/2 as needed for AFIB 08/11/17 05/20/18  Fay Records, MD  ondansetron (ZOFRAN) 4 MG tablet Take 1 tablet (4 mg total) by mouth every 6 (six) hours as needed for nausea. 05/24/18   Debbe Odea, MD    Family History Family History  Problem Relation Age of Onset  . Hypertension Other   . Coronary artery disease Other   . Dementia Mother   . Dementia Maternal Aunt     Social History Social History   Tobacco Use  . Smoking status: Former Smoker    Packs/day: 0.80    Years: 15.00    Pack years: 12.00    Types: Cigarettes    Start date: 02/13/1989    Last attempt to quit: 04/28/2004    Years since quitting: 14.0  . Smokeless tobacco: Never Used  Substance Use Topics  . Alcohol use: No    Alcohol/week: 0.0 standard drinks  . Drug use: No     Allergies   Ace inhibitors; Azithromycin; Ciprofloxacin; Other; Statins; Medrol [methylprednisolone]; and Tetracyclines & related   Review of Systems Review of Systems  Constitutional: Negative for fever.  Eyes: Negative for visual disturbance.  Respiratory: Negative for cough and shortness of breath.   Cardiovascular: Negative for chest pain.  Gastrointestinal: Negative for abdominal pain, nausea and vomiting.    Genitourinary: Negative for dysuria and hematuria.  Neurological: Positive for headaches. Negative for weakness and numbness.  All other systems reviewed and are negative.    Physical Exam Updated Vital Signs BP 115/74   Pulse 67   Temp 98.1 F (36.7 C) (Oral)   Resp 16   Ht 5\' 3"  (1.6 m)   Wt 87 kg   LMP 08/25/2012   SpO2 100%   BMI 33.98 kg/m   Physical Exam Vitals signs and nursing note reviewed.  Constitutional:      Appearance: Normal appearance. She is well-developed.     Comments: Appears uncomfortable  HENT:     Head: Normocephalic and atraumatic.  Eyes:     General: Lids are normal.     Conjunctiva/sclera: Conjunctivae normal.  Pupils: Pupils are equal, round, and reactive to light.  Neck:     Musculoskeletal: Full passive range of motion without pain and neck supple. No neck rigidity.     Comments: Neck is supple without rigidity. Cardiovascular:     Rate and Rhythm: Normal rate and regular rhythm.     Pulses: Normal pulses.     Heart sounds: Normal heart sounds. No murmur. No friction rub. No gallop.   Pulmonary:     Effort: Pulmonary effort is normal.     Breath sounds: Normal breath sounds.  Abdominal:     Palpations: Abdomen is soft. Abdomen is not rigid.     Tenderness: There is no abdominal tenderness. There is no guarding.  Musculoskeletal: Normal range of motion.  Skin:    General: Skin is warm and dry.     Capillary Refill: Capillary refill takes less than 2 seconds.  Neurological:     Mental Status: She is alert and oriented to person, place, and time.     Comments: Cranial nerves III-XII intact Follows commands, Moves all extremities  5/5 strength to BUE and BLE  Slight weakness in right grip strength but otherwise strength in RUE is normal Sensation intact throughout all major nerve distributions Normal coordination No pronator drift. No gait abnormalities  No slurred speech. No facial droop.   Psychiatric:        Speech: Speech  normal.      ED Treatments / Results  Labs (all labs ordered are listed, but only abnormal results are displayed) Labs Reviewed  BASIC METABOLIC PANEL - Abnormal; Notable for the following components:      Result Value   Creatinine, Ser 1.09 (*)    GFR calc non Af Amer 56 (*)    All other components within normal limits  CBC WITH DIFFERENTIAL/PLATELET - Abnormal; Notable for the following components:   Platelets 420 (*)    All other components within normal limits    EKG None  Radiology Ct Head Wo Contrast  Result Date: 05/27/2018 CLINICAL DATA:  Headache EXAM: CT HEAD WITHOUT CONTRAST TECHNIQUE: Contiguous axial images were obtained from the base of the skull through the vertex without intravenous contrast. COMPARISON:  CT 09/10/2017 FINDINGS: Brain: No acute territorial infarction, hemorrhage or intracranial mass. Ventricles are nonenlarged. Vascular: Previous right aneurysm clipping. No hyperdense vessels. No unexpected calcification. Carotid vascular calcification Skull: Prior right frontal and parietal craniotomy.  No fracture. Sinuses/Orbits: Mucous retention cyst right maxillary sinus. Other: None IMPRESSION: 1. No CT evidence for acute intracranial abnormality. 2. Prior right craniotomy and right aneurysm clipping. Electronically Signed   By: Donavan Foil M.D.   On: 05/27/2018 21:33    Procedures Procedures (including critical care time)  Medications Ordered in ED Medications  sodium chloride 0.9 % bolus 1,000 mL (1,000 mLs Intravenous New Bag/Given 05/27/18 2207)  ondansetron (ZOFRAN) injection 4 mg (4 mg Intravenous Given 05/27/18 2212)  metoCLOPramide (REGLAN) injection 10 mg (10 mg Intravenous Given 05/27/18 2211)  diphenhydrAMINE (BENADRYL) injection 25 mg (25 mg Intravenous Given 05/27/18 2209)     Initial Impression / Assessment and Plan / ED Course  I have reviewed the triage vital signs and the nursing notes.  Pertinent labs & imaging results that were  available during my care of the patient were reviewed by me and considered in my medical decision making (see chart for details).     58 year old female with past medical history of brain aneurysm, hypertension, hyperlipidemia who presents for evaluation of  headache that began approxi-7:30 PM.  Patient reports she woke up with a headache.  She states it feels different than her normal headaches.  Not associated with any nausea, vomiting, numbness/weakness, vision changes.  History of brain aneurysm but she states it was repaired approximately 6 years ago.  No recent fevers. Patient is afebrile, non-toxic appearing, sitting comfortably on examination table. Vital signs reviewed and stable.  On exam, she has slightly decreased grip strength on the right upper extremity but then strength is otherwise normal in the right upper extremity.  No other neuro deficits.  Given that she is complaining that this is not like her normal headaches that she woke up with her headache, will plan for CT head for evaluation of any intracranial hemorrhage.  We will plan to check basic labs.  CT head shows no evidence of bleeding.  Will plan to give migraine cocktail and reassess.  Patient signed out to Dr. Vallery Ridge pending reevaluation after migraine cocktail.  Final Clinical Impressions(s) / ED Diagnoses   Final diagnoses:  Bad headache  Diarrhea, unspecified type  H/O cerebral aneurysm repair    ED Discharge Orders    None       Desma Mcgregor 05/28/18 1831    Charlesetta Shanks, MD 05/30/18 1209

## 2018-05-28 DIAGNOSIS — I10 Essential (primary) hypertension: Secondary | ICD-10-CM | POA: Diagnosis not present

## 2018-05-28 DIAGNOSIS — Z6835 Body mass index (BMI) 35.0-35.9, adult: Secondary | ICD-10-CM | POA: Diagnosis not present

## 2018-05-28 DIAGNOSIS — K5792 Diverticulitis of intestine, part unspecified, without perforation or abscess without bleeding: Secondary | ICD-10-CM | POA: Diagnosis not present

## 2018-05-28 MED ORDER — LOPERAMIDE HCL 2 MG PO CAPS: 2.0000 mg | ORAL_CAPSULE | Freq: Four times a day (QID) | ORAL | 0 refills | Status: DC | PRN

## 2018-05-28 MED ORDER — OXYCODONE-ACETAMINOPHEN 5-325 MG PO TABS: 1.0000 | ORAL_TABLET | Freq: Four times a day (QID) | ORAL | 0 refills | Status: DC | PRN

## 2018-05-28 NOTE — ED Provider Notes (Signed)
Medical screening examination/treatment/procedure(s) were conducted as a shared visit with non-physician practitioner(s) and myself.  I personally evaluated the patient during the encounter.  None Patient with history of aneurysm repair.  She reports that she developed a headache after she awakened from a nap.  Headache is bitemporal.  Most predominant in his central frontal parietal backslash forehead area.  Patient is alert and appropriate.  No confusion.  Movements are coordinated purposeful.  No motor deficits.  No cognitive dysfunction.  ENT exam normal.  CT does not show any acute bleed.  Patient has CT angiogram 09/10/2017.  Do not suspect recurrence of aneurysm at this time.  Patient has good cognitive function and stable for discharge.   Charlesetta Shanks, MD 06/12/18 0040

## 2018-05-28 NOTE — Discharge Instructions (Addendum)
1.  Return to the emergency department if your symptoms are worsening or changing. 2.  You may take 1-2 Percocet every 6 hours if needed for abdominal pain or headache not relieved by plain Tylenol alone.  DO NOT take Percocet at the same time as Tylenol because Percocet contains Tylenol and you would get too much.  You may take 1 regular strength Tylenol tablet and 1 Percocet tablet together.  You may NOT take 2 Tylenol tablets and Percocet.

## 2018-06-02 ENCOUNTER — Encounter (INDEPENDENT_AMBULATORY_CARE_PROVIDER_SITE_OTHER): Payer: Self-pay | Admitting: *Deleted

## 2018-06-07 DIAGNOSIS — I10 Essential (primary) hypertension: Secondary | ICD-10-CM | POA: Diagnosis not present

## 2018-06-07 DIAGNOSIS — R1031 Right lower quadrant pain: Secondary | ICD-10-CM | POA: Diagnosis not present

## 2018-06-07 DIAGNOSIS — Z7982 Long term (current) use of aspirin: Secondary | ICD-10-CM | POA: Diagnosis not present

## 2018-06-07 DIAGNOSIS — E7800 Pure hypercholesterolemia, unspecified: Secondary | ICD-10-CM | POA: Diagnosis not present

## 2018-06-07 DIAGNOSIS — Z79899 Other long term (current) drug therapy: Secondary | ICD-10-CM | POA: Diagnosis not present

## 2018-06-07 DIAGNOSIS — R109 Unspecified abdominal pain: Secondary | ICD-10-CM | POA: Diagnosis not present

## 2018-06-07 DIAGNOSIS — Z87891 Personal history of nicotine dependence: Secondary | ICD-10-CM | POA: Diagnosis not present

## 2018-06-07 DIAGNOSIS — R5381 Other malaise: Secondary | ICD-10-CM | POA: Diagnosis not present

## 2018-06-08 ENCOUNTER — Ambulatory Visit: Payer: BLUE CROSS/BLUE SHIELD | Admitting: Nurse Practitioner

## 2018-06-09 ENCOUNTER — Telehealth (INDEPENDENT_AMBULATORY_CARE_PROVIDER_SITE_OTHER): Payer: Self-pay | Admitting: *Deleted

## 2018-06-09 ENCOUNTER — Ambulatory Visit (INDEPENDENT_AMBULATORY_CARE_PROVIDER_SITE_OTHER): Payer: BLUE CROSS/BLUE SHIELD | Admitting: Internal Medicine

## 2018-06-09 ENCOUNTER — Encounter (INDEPENDENT_AMBULATORY_CARE_PROVIDER_SITE_OTHER): Payer: Self-pay | Admitting: *Deleted

## 2018-06-09 ENCOUNTER — Encounter (INDEPENDENT_AMBULATORY_CARE_PROVIDER_SITE_OTHER): Payer: Self-pay | Admitting: Internal Medicine

## 2018-06-09 VITALS — BP 119/79 | HR 76 | Temp 98.6°F | Ht 63.0 in | Wt 187.9 lb

## 2018-06-09 DIAGNOSIS — K5732 Diverticulitis of large intestine without perforation or abscess without bleeding: Secondary | ICD-10-CM | POA: Insufficient documentation

## 2018-06-09 DIAGNOSIS — K5900 Constipation, unspecified: Secondary | ICD-10-CM

## 2018-06-09 DIAGNOSIS — Z800 Family history of malignant neoplasm of digestive organs: Secondary | ICD-10-CM

## 2018-06-09 DIAGNOSIS — I10 Essential (primary) hypertension: Secondary | ICD-10-CM | POA: Diagnosis not present

## 2018-06-09 DIAGNOSIS — G43909 Migraine, unspecified, not intractable, without status migrainosus: Secondary | ICD-10-CM | POA: Diagnosis not present

## 2018-06-09 DIAGNOSIS — I671 Cerebral aneurysm, nonruptured: Secondary | ICD-10-CM | POA: Diagnosis not present

## 2018-06-09 DIAGNOSIS — E782 Mixed hyperlipidemia: Secondary | ICD-10-CM | POA: Diagnosis not present

## 2018-06-09 MED ORDER — SUPREP BOWEL PREP KIT 17.5-3.13-1.6 GM/177ML PO SOLN: 1.0000 | Freq: Once | ORAL | 0 refills | Status: AC

## 2018-06-09 NOTE — Telephone Encounter (Signed)
Patient needs suprep 

## 2018-06-09 NOTE — Progress Notes (Addendum)
Subjective:    Patient ID: [name] [name], female    DOB: 1960/09/29, 58 y.o.   MRN: [id number]  HPI Referred by Dr. Quillian Quince for diverticulitis/colonoscopy. Recently admitted to  [facility] 05/20/2018 with diverticulitis. Presented with 2 day hx of abdominal pain. CT scan on 05/20/2018 revealedmild fat stranding along the ascending colon that is new from yesterday. No definitive source, question diverticulitis as there are diverticula in this region. An omental infarct is considered but given prominent leukocytosis infection seems more likely. No inflammation seen around the appendix. She was covered with Augmentin (allergic to Cipro). Her last colonoscopy (abdominal pain, abnormal CT of the abdomen) was in 2013 by Dr. Britta Mccreedy which revealed scattered diverticula thru out the colon. Retroflexed revealed no abnormalities.  She went to [facility] this past Sunday for headache and rt lower abdominal pain. CT scan revealed no acute findings are noted in abdomen or pelvis to acct for the patient's symptoms. Which revealed few scattered colonic diverticula are noted without inflammatory changes to suggest an acute diverticulitis at this time.  She tells she is feeling a lot better. She still has some tenderness her abdomen. She says she feels like she is constipated. She is on a soft diet.  Her last BM was this am. She feels much better. She is very concerned about her b/p being elevated (not today).    Family hx of colon cancer in a brother in his late 16s. Now in his 64s.  2.12/2018 H andH 14.3 and 43.1,  WBC ct 5.9  Review of Systems Past Medical History:  Diagnosis Date  . 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 Chest pain    denied on 06/18/2012  . Chronic kidney disease    L - upper pole- defect, doesn't effect     . Family history of anesthesia complication    brother & neice "flat lined" during induction: neice d/t a medication given for a blood clotting problem; brother d/t "miscalculated amount of anesthesia"    . Fibromyalgia   . GERD (gastroesophageal reflux disease)    h/o colitis   . H. pylori infection    2012- stomach biopsy  . H/O hiatal hernia   . Headache(784.0)    using tylenol prn  . Hyperlipidemia   . Hypertension   . Hypoglycemia   . Normal cardiac stress test    pt. released fr. Dr. [name] care in 10/2011, all findings w/i normal limits  . Overweight(278.02)    Obesity  . Palpitations   . Sinusitis, acute    seen by PCP- 06/17/2012, will treat /w augmentin & tussin/DM  . Uterine fibroid    pt. states that she has a "closed cervix"     Past Surgical History:  Procedure Laterality Date  . COLONOSCOPY    . CRANIOTOMY Right 06/24/2012   Procedure: CRANIOTOMY INTRACRANIAL ANEURYSM FOR CAROTID;  Surgeon: [name], MD;  Location: [facility];  Service: Neurosurgery;  Laterality: Right;  RIGHT Pterional Craniotomy for aneurysm  . LIPOMA EXCISION     buttock 10/2010    Allergies  Allergen Reactions  . Ace Inhibitors Swelling  . Azithromycin Anaphylaxis    Breathing difficulty, swelling  . Ciprofloxacin Nausea And Vomiting    Also: kidney failure   . Nsaids     Kidney disease  . Other Swelling, Other (See Comments) and Cough    Sneezing allergy to cats/grass/dust  . Statins Other (See Comments)    Cramping, muscle pain  . Medrol [Methylprednisolone]  Palpitations    Heart racing, increased BP  . Tetracyclines & Related Palpitations    Heart racing, increased bp    Current Outpatient Medications on File Prior to Visit  Medication Sig Dispense Refill  . acetaminophen (TYLENOL) 325 MG tablet Take 2 tablets (650 mg total) by mouth every 6 (six) hours as needed for mild pain (or Fever >/= 101).    [name] albuterol (PROAIR HFA) 108 (90 Base) MCG/ACT inhaler Inhale 1-2 puffs into the lungs every 4 (four) hours as needed for wheezing or shortness of breath.     [name] aspirin EC 81 MG tablet Take 81 mg by mouth daily.     . chlorthalidone (HYGROTON) 25 MG tablet Take 25 mg by  mouth daily.    [name] loratadine (CLARITIN) 10 MG tablet Take 10 mg by mouth daily.     . metoprolol succinate (TOPROL-XL) 50 MG 24 hr tablet Take 50 mg by mouth 2 (two) times daily. Take with or immediately following a meal.    . metoprolol tartrate (LOPRESSOR) 50 MG tablet Take 1 tablet (50 mg total) by mouth 2 (two) times daily. (Patient taking differently: Take 25-50 mg by mouth See admin instructions. Take one tab by mouth every morning, 1/2 tab every evening, & the other 1/2 as needed for AFIB) 180 tablet 3   No current facility-administered medications on file prior to visit.         Objective:   Physical Exam Blood pressure 119/79, pulse 76, temperature 98.6 F (37 C), height 5\' 3"  (1.6 m), weight 187 lb 14.4 oz (85.2 kg), last menstrual period 08/25/2012. Alert and oriented. Skin warm and dry. Oral mucosa is moist.   . Sclera anicteric, conjunctivae is pink. Thyroid not enlarged. No cervical lymphadenopathy. Lungs clear. Heart regular rate and rhythm.  Abdomen is soft. Bowel sounds are positive. No hepatomegaly. No abdominal masses felt. No tenderness.  No edema to lower extremities.           Assessment & Plan:  Diverticulitis. Will schedule a colonoscopy to be sure she does not have an underlying colon cancer. Will put out 8 weeks Family hx of colon cancer in a brother.  Constipation: Stool softener bid. Will get labs from [facility]

## 2018-06-09 NOTE — Patient Instructions (Addendum)
The risks of bleeding, perforation and infection were reviewed with patient. Get a stool softener in am and one at night.

## 2018-06-17 NOTE — Progress Notes (Deleted)
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  UPDATE: Intensity:  *** Duration:  *** Frequency:  ***  She presented to the ED on 05/27/18 after waking up from a nap.  CT of head was personally reviewed and showed no acute abnormality such as bleed.  She was treated with IVF, Zofran, Reglan and Benadryl.  Frequency of abortive medication: *** Current NSAIDS: Aspirin 81 mg daily Current analgesics: Tylenol Current triptans: None Current ergotamine: None Current anti-emetic: None Current muscle relaxants: Flexeril 5 mg Current anti-anxiolytic: None Current sleep aide: None Current Antihypertensive medications: Cardizem, Lopressor Current Antidepressant medications: Nortriptyline 25mg  at bedtime Current Anticonvulsant medications: None Current anti-CGRP: None Current Vitamins/Herbal/Supplements: None Current Antihistamines/Decongestants:  Claritin Other therapy: None  Caffeine: Drinks decaf. Diet: Hydrates Exercise: Not routine Depression: No; Anxiety: Yes Other pain: Neck pain Sleep hygiene: Poor  To further evaluate pulsatile tinnitus, carotid doppler was performed on 04/01/18 which demonstrated no hemodynamically significant stenosis.  TSH from 03/19/18 was 1.40.  HISTORY: She began experiencing headaches with neck pain in 2013.  MRI and MRA of the head showed a 4 x 5 mm terminal right ICA aneurysm.  There was also symmetric mild signal along the bilateral corticospinal tracts, findings suggestive of possible ALS but also may have been normal signal.  Findings were also seen on a repeat MRI in 2018.  MRI of the cervical spine demonstrated minimal degenerative changes but no significant foraminal or spinal stenosis.  She underwent aneurysm clipping and headaches had  resolved for several years.  Headaches returned in 2017.  She reports mild to moderate left sided pressure as well as areas of sore spots on her scalp.  Headaches occur about 3 times a week, usually at night and lasts 2 hours with Tylenol or until she falls asleep.  She also reports heart beat in her left ear.  She also reports episodes of dizziness.  It is not positional.  She describes it as a slow onset of pressure in the head and needs to sit down.  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  Past NSAIDS: None Past analgesics: None Past abortive triptans: None Past abortive ergotamine: None Past muscle relaxants: Robaxin Past anti-emetic: Zofran ODT 8 mg Past antihypertensive medications: Lopressor Past antidepressant medications: None Past anticonvulsant medications: none Past anti-CGRP: None Past vitamins/Herbal/Supplements: None Past antihistamines/decongestants: None Other past therapies: None  PAST MEDICAL HISTORY: Past Medical History:  Diagnosis Date  . 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 Chest pain    denied on 06/18/2012  . Chronic kidney disease    L - upper pole- defect, doesn't effect     . Family history of anesthesia complication    brother & neice "flat lined" during induction: neice d/t a medication given for a blood clotting problem; brother d/t "miscalculated amount of anesthesia"  . Fibromyalgia   . GERD (gastroesophageal reflux disease)    h/o colitis   . H. pylori infection    2012- stomach biopsy  . H/O hiatal hernia   . Headache(784.0)    using tylenol prn  . Hyperlipidemia   . Hypertension   .  Hypoglycemia   . Normal cardiac stress test    pt. released fr. Dr. [name] care in 10/2011, all findings w/i normal limits  .  Overweight(278.02)    Obesity  . Palpitations   . Sinusitis, acute    seen by PCP- 06/17/2012, will treat /w augmentin & tussin/DM  . Uterine fibroid    pt. states that she has a "closed cervix"     MEDICATIONS: Current Outpatient Medications on File Prior to Visit  Medication Sig Dispense Refill  . acetaminophen (TYLENOL) 325 MG tablet Take 2 tablets (650 mg total) by mouth every 6 (six) hours as needed for mild pain (or Fever >/= 101).    [name] albuterol (PROAIR HFA) 108 (90 Base) MCG/ACT inhaler Inhale 1-2 puffs into the lungs every 4 (four) hours as needed for wheezing or shortness of breath.     [name] aspirin EC 81 MG tablet Take 81 mg by mouth daily.     . chlorthalidone (HYGROTON) 25 MG tablet Take 25 mg by mouth daily.    [name] loratadine (CLARITIN) 10 MG tablet Take 10 mg by mouth daily.     . metoprolol succinate (TOPROL-XL) 50 MG 24 hr tablet Take 50 mg by mouth 2 (two) times daily. Take with or immediately following a meal.    . metoprolol tartrate (LOPRESSOR) 50 MG tablet Take 1 tablet (50 mg total) by mouth 2 (two) times daily. (Patient taking differently: Take 25-50 mg by mouth See admin instructions. Take one tab by mouth every morning, 1/2 tab every evening, & the other 1/2 as needed for AFIB) 180 tablet 3   No current facility-administered medications on file prior to visit.     ALLERGIES: Allergies  Allergen Reactions  . Ace Inhibitors Swelling  . Azithromycin Anaphylaxis    Breathing difficulty, swelling  . Ciprofloxacin Nausea And Vomiting    Also: kidney failure   . Nsaids     Kidney disease  . Other Swelling, Other (See Comments) and Cough    Sneezing allergy to cats/grass/dust  . Statins Other (See Comments)    Cramping, muscle pain  . Medrol [Methylprednisolone] Palpitations    Heart racing, increased BP  . Tetracyclines & Related Palpitations    Heart racing, increased bp    FAMILY HISTORY: Family History  Problem Relation Age of Onset  . Hypertension Other    . Coronary artery disease Other   . Dementia Mother   . Dementia Maternal Aunt    ***.  SOCIAL HISTORY: Social History   Socioeconomic History  . Marital status: Single    Spouse name: Not on file  . Number of children: Not on file  . Years of education: Not on file  . Highest education level: Not on file  Occupational History  . Occupation: Full time    Employer: [employer]  . Financial resource strain: Not on file  . Food insecurity:    Worry: Not on file    Inability: Not on file  . Transportation needs:    Medical: Not on file    Non-medical: Not on file  Tobacco Use  . Smoking status: Former Smoker    Packs/day: 0.80    Years: 15.00    Pack years: 12.00    Types: Cigarettes    Start date: 02/13/1989    Last attempt to quit: 04/28/2004    Years since quitting: 14.1  . Smokeless tobacco: Never Used  Substance and Sexual Activity  . Alcohol use: No  Alcohol/week: 0.0 standard drinks  . Drug use: No  . Sexual activity: Never    Birth control/protection: None, Abstinence  Lifestyle  . Physical activity:    Days per week: Not on file    Minutes per session: Not on file  . Stress: Not on file  Relationships  . Social connections:    Talks on phone: Not on file    Gets together: Not on file    Attends religious service: Not on file    Active member of club or organization: Not on file    Attends meetings of clubs or organizations: Not on file    Relationship status: Not on file  . Intimate partner violence:    Fear of current or ex partner: Not on file    Emotionally abused: Not on file    Physically abused: Not on file    Forced sexual activity: Not on file  Other Topics Concern  . Not on file  Social History Narrative   Single    REVIEW OF SYSTEMS: Constitutional: No fevers, chills, or sweats, no generalized fatigue, change in appetite Eyes: No visual changes, double vision, eye pain Ear, nose and throat: No hearing loss,  ear pain, nasal congestion, sore throat Cardiovascular: No chest pain, palpitations Respiratory:  No shortness of breath at rest or with exertion, wheezes GastrointestinaI: No nausea, vomiting, diarrhea, abdominal pain, fecal incontinence Genitourinary:  No dysuria, urinary retention or frequency Musculoskeletal:  No neck pain, back pain Integumentary: No rash, pruritus, skin lesions Neurological: as above Psychiatric: No depression, insomnia, anxiety Endocrine: No palpitations, fatigue, diaphoresis, mood swings, change in appetite, change in weight, increased thirst Hematologic/Lymphatic:  No purpura, petechiae. Allergic/Immunologic: no itchy/runny eyes, nasal congestion, recent allergic reactions, rashes  PHYSICAL EXAM: *** General: No acute distress.  Patient appears ***-groomed.  *** body habitus. Head:  Normocephalic/atraumatic Eyes:  Fundi examined but not visualized Neck: supple, no paraspinal tenderness, full range of motion Heart:  Regular rate and rhythm Lungs:  Clear to auscultation bilaterally Back: No paraspinal tenderness Neurological Exam: alert and oriented to person, place, and time. Attention span and concentration intact, recent and remote memory intact, fund of knowledge intact.  Speech fluent and not dysarthric, language intact.  CN II-XII intact. Bulk and tone normal, muscle strength 5/5 throughout.  Sensation to light touch, temperature and vibration intact.  Deep tendon reflexes 2+ throughout, toes downgoing.  Finger to nose and heel to shin testing intact.  Gait normal, Romberg negative.  IMPRESSION: ***  PLAN: ***  Metta Clines, DO  CC: ***

## 2018-06-19 ENCOUNTER — Ambulatory Visit: Payer: BLUE CROSS/BLUE SHIELD | Admitting: Neurology

## 2018-06-22 DIAGNOSIS — J00 Acute nasopharyngitis [common cold]: Secondary | ICD-10-CM | POA: Diagnosis not present

## 2018-06-22 DIAGNOSIS — Z6832 Body mass index (BMI) 32.0-32.9, adult: Secondary | ICD-10-CM | POA: Diagnosis not present

## 2018-06-22 DIAGNOSIS — R6889 Other general symptoms and signs: Secondary | ICD-10-CM | POA: Diagnosis not present

## 2018-06-23 ENCOUNTER — Telehealth (INDEPENDENT_AMBULATORY_CARE_PROVIDER_SITE_OTHER): Payer: Self-pay | Admitting: Internal Medicine

## 2018-06-23 NOTE — Telephone Encounter (Signed)
I advised her to call her PCP concerning this.

## 2018-06-23 NOTE — Telephone Encounter (Signed)
Patient called stated she was given tamaflu for a cough she has got - would like for you to call her regarding her medication - ph# [phone]

## 2018-06-23 NOTE — Telephone Encounter (Signed)
I did not think it would be a problem however. Has been running a fever and has a cough. Is on another antibiotic now for her fever, cough

## 2018-06-24 IMAGING — RF DG ESOPHAGUS
9 of 10 series · 14 of 24 positions shown · non-contrast
Comparison: None

CLINICAL DATA: Dysphagia, food slow to go down, feels like food
sticking in lower chest, tightness in chest, history of H pylori
infection and acid reflux, hypertension, obesity, former smoker

EXAM:
ESOPHOGRAM / BARIUM SWALLOW / BARIUM TABLET STUDY
TECHNIQUE: Combined double contrast and single contrast examination performed
using effervescent crystals, thick barium liquid, and thin barium
liquid. The patient was observed with fluoroscopy swallowing a 13 mm
barium sulphate tablet.
FLUOROSCOPY TIME:  Fluoroscopy Time:  1 minutes 30 seconds
Radiation Exposure Index (if provided by the fluoroscopic device):
24.5 mGy
Number of Acquired Spot Images: Multiple screen captures during
fluoroscopy

[Series 1: cp_standard · 0.19mm/px · 2 of 65 frames shown (1 of 9)]
[frame 5/65]
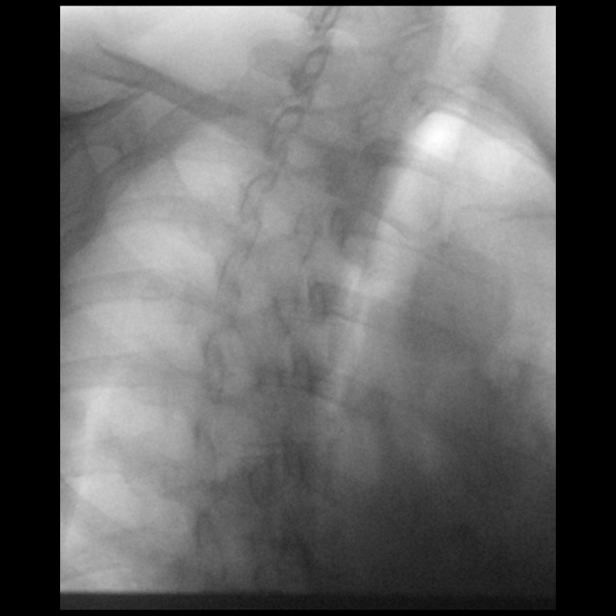
[frame 56/65]
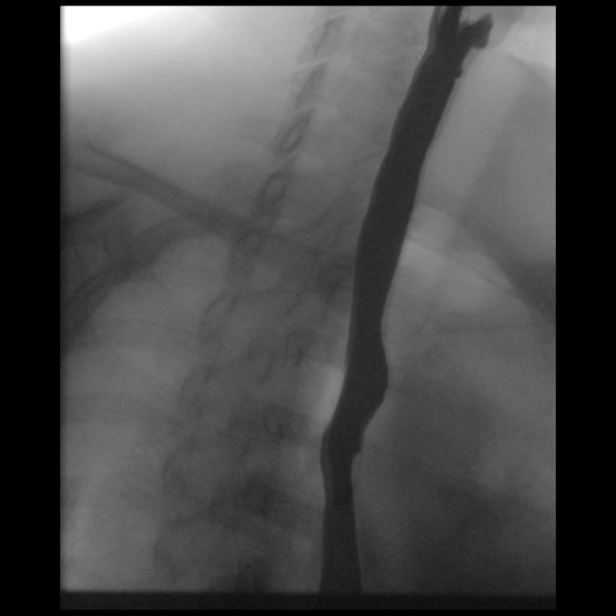

[Series 2: cp_standard · 0.19mm/px · 1 of 61 frames shown (2 of 9)]
[frame 52/61]
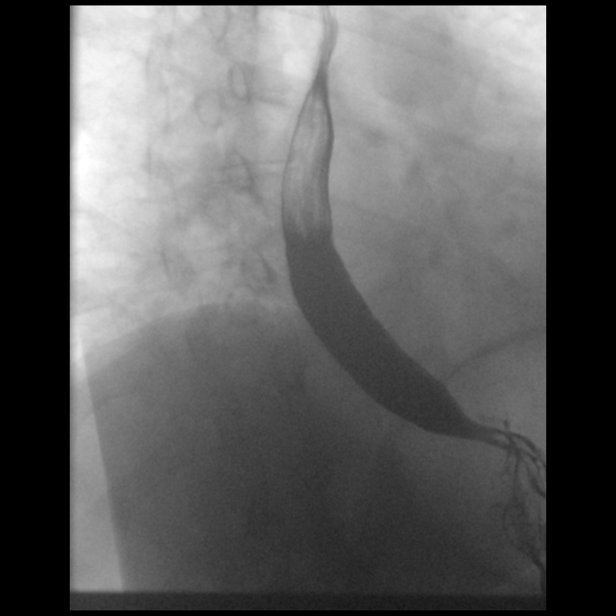

[Series 3: cp_standard · 0.19mm/px · 1 of 24 frames shown (3 of 9)]
[frame 13/24]
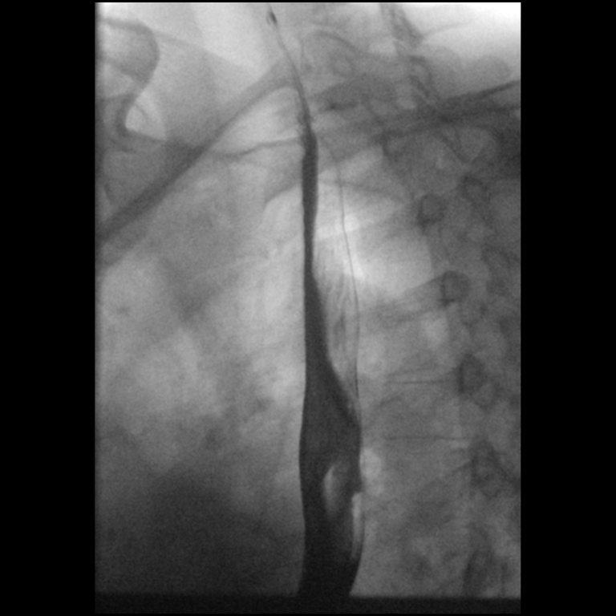

[Series 4: cp_standard · 0.19mm/px · 2 of 64 frames shown (4 of 9)]
[frame 10/64]
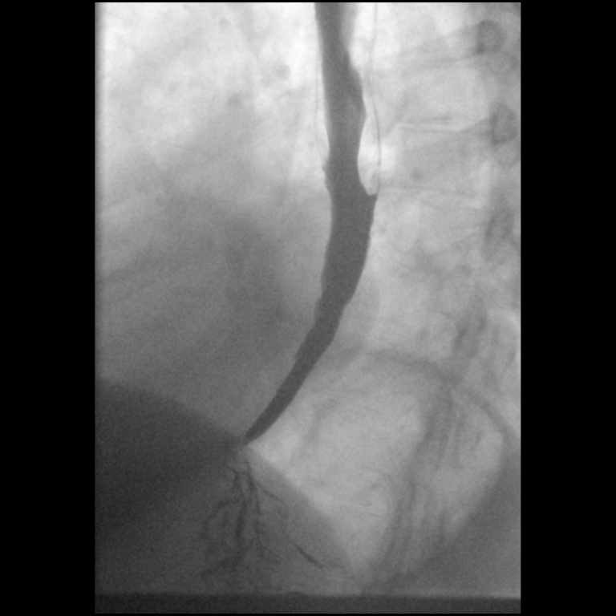
[frame 64/64]
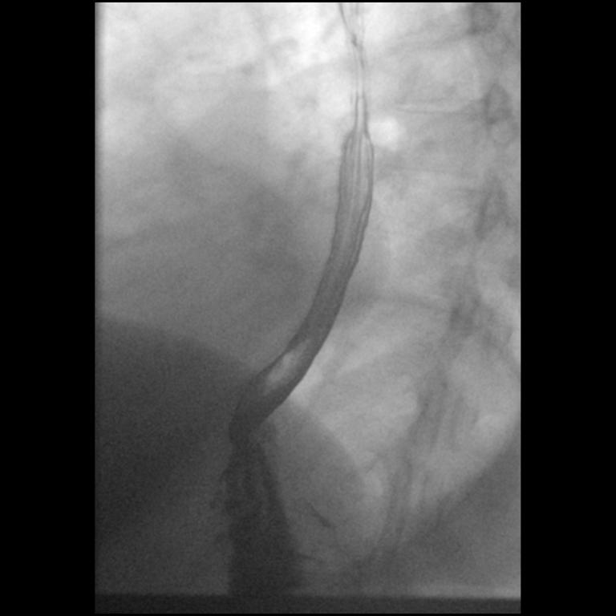

[Series 6: cp_standard · 0.20mm/px · 2 of 16 frames shown (5 of 9)]
[frame 9/16]
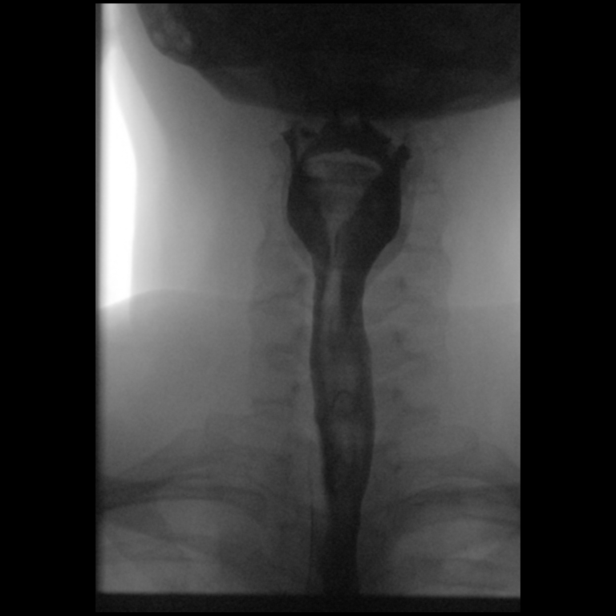
[frame 14/16]
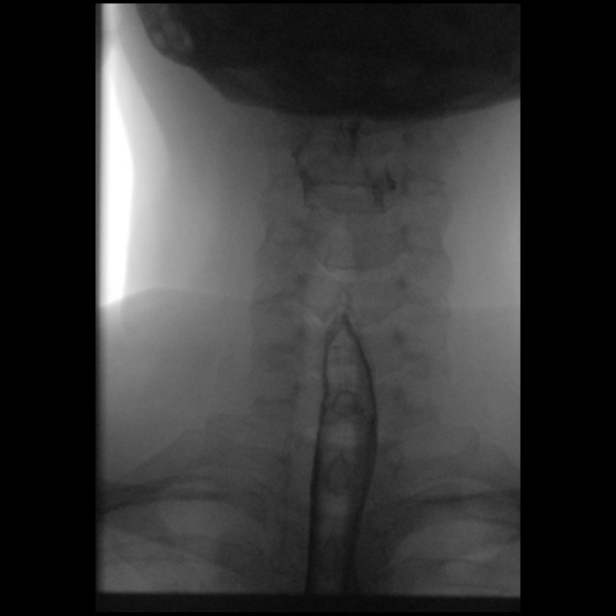

[Series 7: cp_standard · 0.18mm/px · 1 of 38 frames shown (6 of 9)]
[frame 33/38]
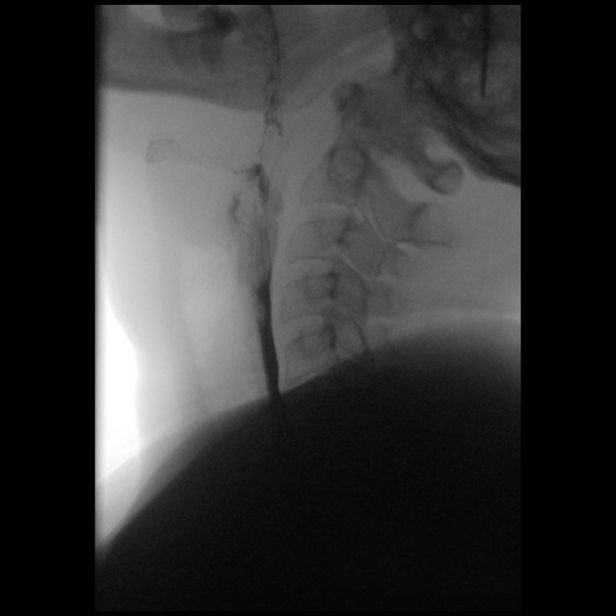

[Series 8: cp_standard · 0.19mm/px · 1 of 20 frames shown (7 of 9)]
[frame 11/20]
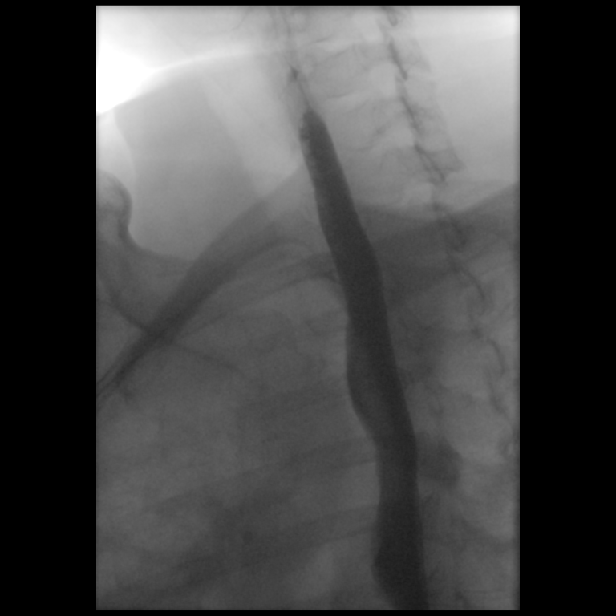

[Series 9: cp_standard · 0.19mm/px · 2 of 16 frames shown (8 of 9)]
[frame 11/16]
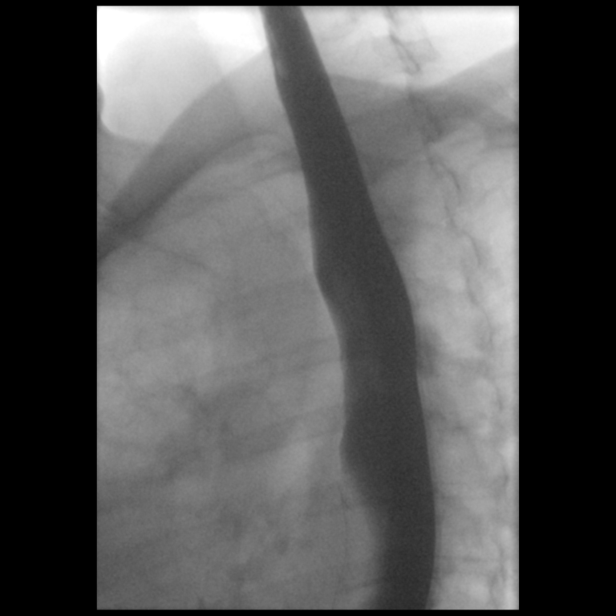
[frame 14/16]
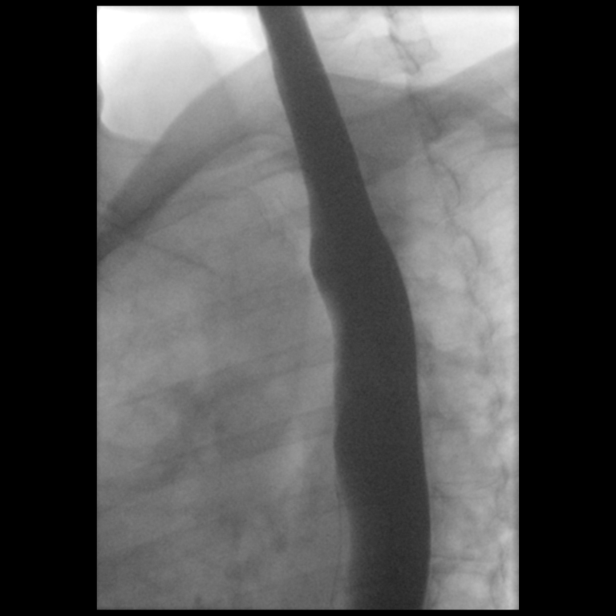

[Series 11: cp_standard · 0.19mm/px · 2 of 49 frames shown (9 of 9)]
[frame 8/49]
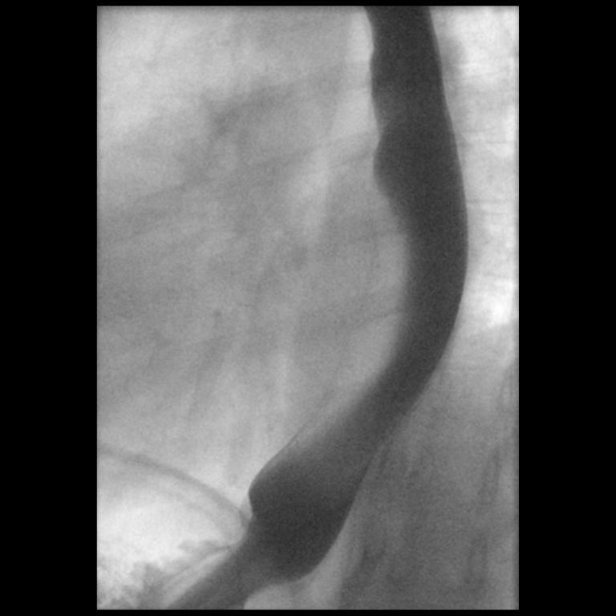
[frame 42/49]
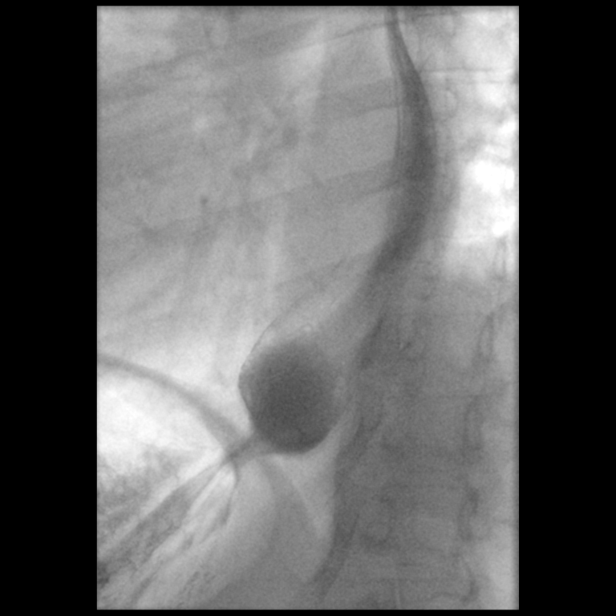

[14 of 24 positions shown; findings below may reference images not displayed]

FINDINGS: Normal esophageal distention and motility.

No esophageal mass or stricture.

12.5 mm diameter barium tablet passed from oral cavity to stomach
without delay.

Smooth appearance of esophageal mucosa on air contrast imaging
without irregularity or ulceration.

No persistent filling defects.

No hiatal hernia or gastroesophageal reflux seen during exam.

Targeted rapid sequence imaging of the cervical esophagus and
hypopharynx showed no laryngeal penetration or aspiration.
IMPRESSION: Normal exam.

## 2018-06-26 ENCOUNTER — Ambulatory Visit: Payer: BLUE CROSS/BLUE SHIELD | Admitting: Cardiology

## 2018-06-26 ENCOUNTER — Encounter: Payer: Self-pay | Admitting: Cardiology

## 2018-06-26 VITALS — BP 117/82 | HR 63 | Ht 63.0 in | Wt 187.0 lb

## 2018-06-26 DIAGNOSIS — R002 Palpitations: Secondary | ICD-10-CM | POA: Diagnosis not present

## 2018-06-26 DIAGNOSIS — I10 Essential (primary) hypertension: Secondary | ICD-10-CM | POA: Diagnosis not present

## 2018-06-26 NOTE — Progress Notes (Signed)
Clinical Summary [name] is a 58 y.o.female seen today for follow up of the following medical problems.This is a focused visit on history of palpitations and HTN, for more detailed history please refer to prior clinic notes.     1. Palpitations - admit 03/2014 with palpitations and chest pain. On that day was hypertensive and tachycardic on admission. - MPI without evidence of ischemia, LVEF 78%. Echo "normal LVEF". TSH 1.4. Event monitor with occasional PACs, no significant arrhythmias - holter 11/2016 no arrhythmias  - initially dilt stopped after recent admission with diverticulitis and hypotension. Since discharge bp's have increased, restarte back on 120mg  daily - no significant palpitaitons.     2. HTN - hydralazinepreviously stopped due to concerns for possible drug induced lupus. Symptoms better. - her dilt was prevoiusly decreased, with this significant elvation in bp's and she went back to takeing 180mg  bid - has been on fairly high dose dual av nodal agents due to history of chronic palpitations, has tolerated regimen.   - diltiazem stopped during Jan 2020 admission with acute diverticulitis due to low bp's. Had also had some prior low heart rates. Chlothalidone stopped as well - pcp restarted chlorthalidione and diltiazem for recurrent high bp's   3. Diverticulitis - admitted Jan 2020 with abdominal pain and diarrhea, CT with evidecne of possible diverticulitis - treated with abx  Past Medical History:  Diagnosis Date  . 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 Chest pain    denied on 06/18/2012  . Chronic kidney disease    L - upper pole- defect, doesn't effect     . Family history of anesthesia complication    brother & neice "flat lined" during induction: neice d/t a medication given for a blood clotting problem; brother d/t "miscalculated amount of anesthesia"  . Fibromyalgia   . GERD (gastroesophageal reflux disease)    h/o colitis   . H. pylori  infection    2012- stomach biopsy  . H/O hiatal hernia   . Headache(784.0)    using tylenol prn  . Hyperlipidemia   . Hypertension   . Hypoglycemia   . Normal cardiac stress test    pt. released fr. Dr. [name] care in 10/2011, all findings w/i normal limits  . Overweight(278.02)    Obesity  . Palpitations   . Sinusitis, acute    seen by PCP- 06/17/2012, will treat /w augmentin & tussin/DM  . Uterine fibroid    pt. states that she has a "closed cervix"      Allergies  Allergen Reactions  . Ace Inhibitors Swelling  . Azithromycin Anaphylaxis    Breathing difficulty, swelling  . Ciprofloxacin Nausea And Vomiting    Also: kidney failure   . Nsaids     Kidney disease  . Other Swelling, Other (See Comments) and Cough    Sneezing allergy to cats/grass/dust  . Statins Other (See Comments)    Cramping, muscle pain  . Medrol [Methylprednisolone] Palpitations    Heart racing, increased BP  . Tetracyclines & Related Palpitations    Heart racing, increased bp     Current Outpatient Medications  Medication Sig Dispense Refill  . acetaminophen (TYLENOL) 325 MG tablet Take 2 tablets (650 mg total) by mouth every 6 (six) hours as needed for mild pain (or Fever >/= 101).    [name] albuterol (PROAIR HFA) 108 (90 Base) MCG/ACT inhaler Inhale 1-2 puffs into the lungs every 4 (four) hours as needed for wheezing or shortness of  breath.     [name] aspirin EC 81 MG tablet Take 81 mg by mouth daily.     . chlorthalidone (HYGROTON) 25 MG tablet Take 25 mg by mouth daily.    [name] loratadine (CLARITIN) 10 MG tablet Take 10 mg by mouth daily.     . metoprolol succinate (TOPROL-XL) 50 MG 24 hr tablet Take 50 mg by mouth 2 (two) times daily. Take with or immediately following a meal.    . metoprolol tartrate (LOPRESSOR) 50 MG tablet Take 1 tablet (50 mg total) by mouth 2 (two) times daily. (Patient taking differently: Take 25-50 mg by mouth See admin instructions. Take one tab by mouth every morning, 1/2 tab  every evening, & the other 1/2 as needed for AFIB) 180 tablet 3   No current facility-administered medications for this visit.      Past Surgical History:  Procedure Laterality Date  . COLONOSCOPY    . CRANIOTOMY Right 06/24/2012   Procedure: CRANIOTOMY INTRACRANIAL ANEURYSM FOR CAROTID;  Surgeon: [name], MD;  Location: [facility];  Service: Neurosurgery;  Laterality: Right;  RIGHT Pterional Craniotomy for aneurysm  . LIPOMA EXCISION     buttock 10/2010     Allergies  Allergen Reactions  . Ace Inhibitors Swelling  . Azithromycin Anaphylaxis    Breathing difficulty, swelling  . Ciprofloxacin Nausea And Vomiting    Also: kidney failure   . Nsaids     Kidney disease  . Other Swelling, Other (See Comments) and Cough    Sneezing allergy to cats/grass/dust  . Statins Other (See Comments)    Cramping, muscle pain  . Medrol [Methylprednisolone] Palpitations    Heart racing, increased BP  . Tetracyclines & Related Palpitations    Heart racing, increased bp      Family History  Problem Relation Age of Onset  . Hypertension Other   . Coronary artery disease Other   . Dementia Mother   . Dementia Maternal Aunt      Social History [name] reports that she quit smoking about 14 years ago. Her smoking use included cigarettes. She started smoking about 29 years ago. She has a 12.00 pack-year smoking history. She has never used smokeless tobacco. Ms. Belmares reports no history of alcohol use.   Review of Systems CONSTITUTIONAL: No weight loss, fever, chills, weakness or fatigue.  HEENT: Eyes: No visual loss, blurred vision, double vision or yellow sclerae.No hearing loss, sneezing, congestion, runny nose or sore throat.  SKIN: No rash or itching.  CARDIOVASCULAR: per hpi RESPIRATORY: No shortness of breath, cough or sputum.  GASTROINTESTINAL: No anorexia, nausea, vomiting or diarrhea. No abdominal pain or blood.  GENITOURINARY: No burning on urination, no  polyuria NEUROLOGICAL: No headache, dizziness, syncope, paralysis, ataxia, numbness or tingling in the extremities. No change in bowel or bladder control.  MUSCULOSKELETAL: No muscle, back pain, joint pain or stiffness.  LYMPHATICS: No enlarged nodes. No history of splenectomy.  PSYCHIATRIC: No history of depression or anxiety.  ENDOCRINOLOGIC: No reports of sweating, cold or heat intolerance. No polyuria or polydipsia.  [name]   Physical Examination Vitals:   06/26/18 1451  BP: 117/82  Pulse: 63  SpO2: 99%   Vitals:   06/26/18 1451  Weight: 187 lb (84.8 kg)  Height: 5\' 3"  (1.6 m)    Gen: resting comfortably, no acute distress HEENT: no scleral icterus, pupils equal round and reactive, no palptable cervical adenopathy,  CV: RRR, no m/r/g, no jvd Resp: Clear to auscultation bilaterally GI:  abdomen is soft, non-tender, non-distended, normal bowel sounds, no hepatosplenomegaly MSK: extremities are warm, no edema.  Skin: warm, no rash Neuro:  no focal deficits Psych: appropriate affect   Diagnostic Studies 05/2009 Nuclear stress No ischemia  03/2014 Echo Study Conclusions  - Left ventricle: The cavity size was normal. Systolic function was normal. Wall motion was normal; there were no regional wall motion abnormalities.  03/2014 Lexiscan MPI IMPRESSION: 1. No reversible ischemia or infarction.  2. Normal left ventricular wall motion.  3. Left ventricular ejection fraction 78%  4. Low-risk stress test findings*.  03/2014 Event Monitor Symptoms correlate with NSR. One episode of fluttering showed 2 PACs. No significant arrhythmias  07/2015 Lexiscan MPI  There was no ST segment deviation noted during stress.  This is a low risk study.  The left ventricular ejection fraction is mildly decreased (45-54%). LVEF is 49% just slightly below normal, visually appears normal. Consider correlating with echo.  There is a small mild intensity distal anterior and  apical defect with mild reversibility. The apex has normal wall motion. Finding is likely secondary to apical thinning, there is also significant radiotracer uptake in the adjacent gut which may create some artifact. Cannot rule would mild apical ischemia. Overall findings are low risk.    11/2016 holter  Min HR 42, Max HR 94, Avg HR 57  No supraventricular or ventricular ectopy  Telemetry tracings show sinus bradycardia and sinus rhythm  No symptoms reported  No significant arrhythmias      Assessment and Plan  1. Palpitations - previously symptoms were not controled until on high dose dual av nodal agents with lopressor and dilt - on lower dose dilt now since recent admision, feeling good. Continue current meds   2. HTN - at goal, continue current meds   F/u 6 months      Arnoldo Lenis, M.D.

## 2018-06-26 NOTE — Patient Instructions (Signed)
Medication Instructions:  Continue all current medications.   Follow-Up: Your physician wants you to follow up in: 6 months.  You will receive a reminder letter in the mail one-two months in advance.  If you don't receive a letter, please call our office to schedule the follow up appointment   Any Other Special Instructions Will Be Listed Below (If Applicable).  If you need a refill on your cardiac medications before your next appointment, please call your pharmacy.  

## 2018-06-29 NOTE — Progress Notes (Deleted)
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  UPDATE: Intensity:  *** Duration:  *** Frequency:  ***  She presented to the ED on 05/27/18 after waking up from a nap.  CT of head was personally reviewed and showed no acute abnormality such as bleed.  She was treated with IVF, Zofran, Reglan and Benadryl.  Frequency of abortive medication: *** Current NSAIDS: Aspirin 81 mg daily Current analgesics: Tylenol Current triptans: None Current ergotamine: None Current anti-emetic: None Current muscle relaxants: Flexeril 5 mg Current anti-anxiolytic: None Current sleep aide: None Current Antihypertensive medications: Cardizem, Lopressor Current Antidepressant medications: Nortriptyline 25mg  at bedtime Current Anticonvulsant medications: None Current anti-CGRP: None Current Vitamins/Herbal/Supplements: None Current Antihistamines/Decongestants:  Claritin Other therapy: None  Caffeine: Drinks decaf. Diet: Hydrates Exercise: Not routine Depression: No; Anxiety: Yes Other pain: Neck pain Sleep hygiene: Poor  To further evaluate pulsatile tinnitus, carotid doppler was performed on 04/01/18 which demonstrated no hemodynamically significant stenosis.  TSH from 03/19/18 was 1.40.  HISTORY: She began experiencing headaches with neck pain in 2013.  MRI and MRA of the head showed a 4 x 5 mm terminal right ICA aneurysm.  There was also symmetric mild signal along the bilateral corticospinal tracts, findings suggestive of possible ALS but also may have been normal signal.  Findings were also seen on a repeat MRI in 2018.  MRI of the cervical spine demonstrated minimal degenerative changes but no significant foraminal or spinal stenosis.  She underwent aneurysm clipping and headaches had  resolved for several years.  Headaches returned in 2017.  She reports mild to moderate left sided pressure as well as areas of sore spots on her scalp.  Headaches occur about 3 times a week, usually at night and lasts 2 hours with Tylenol or until she falls asleep.  She also reports heart beat in her left ear.  She also reports episodes of dizziness.  It is not positional.  She describes it as a slow onset of pressure in the head and needs to sit down.  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  Past NSAIDS: None Past analgesics: None Past abortive triptans: None Past abortive ergotamine: None Past muscle relaxants: Robaxin Past anti-emetic: Zofran ODT 8 mg Past antihypertensive medications: Lopressor Past antidepressant medications: None Past anticonvulsant medications: none Past anti-CGRP: None Past vitamins/Herbal/Supplements: None Past antihistamines/decongestants: None Other past therapies: None  PAST MEDICAL HISTORY: Past Medical History:  Diagnosis Date  . 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 Chest pain    denied on 06/18/2012  . Chronic kidney disease    L - upper pole- defect, doesn't effect     . Family history of anesthesia complication    brother & neice "flat lined" during induction: neice d/t a medication given for a blood clotting problem; brother d/t "miscalculated amount of anesthesia"  . Fibromyalgia   . GERD (gastroesophageal reflux disease)    h/o colitis   . H. pylori infection    2012- stomach biopsy  . H/O hiatal hernia   . Headache(784.0)    using tylenol prn  . Hyperlipidemia   . Hypertension   .  Hypoglycemia   . Normal cardiac stress test    pt. released fr. Dr. [name] care in 10/2011, all findings w/i normal limits  .  Overweight(278.02)    Obesity  . Palpitations   . Sinusitis, acute    seen by PCP- 06/17/2012, will treat /w augmentin & tussin/DM  . Uterine fibroid    pt. states that she has a "closed cervix"     MEDICATIONS: Current Outpatient Medications on File Prior to Visit  Medication Sig Dispense Refill  . acetaminophen (TYLENOL) 325 MG tablet Take 2 tablets (650 mg total) by mouth every 6 (six) hours as needed for mild pain (or Fever >/= 101).    [name] albuterol (PROAIR HFA) 108 (90 Base) MCG/ACT inhaler Inhale 1-2 puffs into the lungs every 4 (four) hours as needed for wheezing or shortness of breath.     [name] amoxicillin (AMOXIL) 875 MG tablet Take 875 mg by mouth 2 (two) times daily.    [name] aspirin EC 81 MG tablet Take 81 mg by mouth daily.     . chlorthalidone (HYGROTON) 25 MG tablet Take 25 mg by mouth daily.    [name] diltiazem (CARDIZEM CD) 120 MG 24 hr capsule Take 120 mg by mouth daily.     [name] KLOR-CON M20 20 MEQ tablet Take 20 mEq by mouth daily.    [name] loratadine (CLARITIN) 10 MG tablet Take 10 mg by mouth daily.     . metoprolol tartrate (LOPRESSOR) 50 MG tablet Take 1 tablet (50 mg total) by mouth 2 (two) times daily. (Patient taking differently: Take 25-50 mg by mouth See admin instructions. Take one tab by mouth every morning, 1/2 tab every evening, & the other 1/2 as needed for AFIB) 180 tablet 3   No current facility-administered medications on file prior to visit.     ALLERGIES: Allergies  Allergen Reactions  . Ace Inhibitors Swelling  . Azithromycin Anaphylaxis    Breathing difficulty, swelling  . Ciprofloxacin Nausea And Vomiting    Also: kidney failure   . Nsaids     Kidney disease  . Other Swelling, Other (See Comments) and Cough    Sneezing allergy to cats/grass/dust  . Statins Other (See Comments)    Cramping, muscle pain  . Medrol [Methylprednisolone] Palpitations    Heart racing, increased BP  . Tetracyclines & Related Palpitations    Heart racing, increased bp     FAMILY HISTORY: Family History  Problem Relation Age of Onset  . Hypertension Other   . Coronary artery disease Other   . Dementia Mother   . Dementia Maternal Aunt    SOCIAL HISTORY: Social History   Socioeconomic History  . Marital status: Single    Spouse name: Not on file  . Number of children: Not on file  . Years of education: Not on file  . Highest education level: Not on file  Occupational History  . Occupation: Full time    Employer: [employer]  . Financial resource strain: Not on file  . Food insecurity:    Worry: Not on file    Inability: Not on file  . Transportation needs:    Medical: Not on file    Non-medical: Not on file  Tobacco Use  . Smoking status: Former Smoker    Packs/day: 0.80    Years: 15.00    Pack years: 12.00    Types: Cigarettes    Start date: 02/13/1989    Last attempt to quit: 04/28/2004  Years since quitting: 14.1  . Smokeless tobacco: Never Used  Substance and Sexual Activity  . Alcohol use: No    Alcohol/week: 0.0 standard drinks  . Drug use: No  . Sexual activity: Never    Birth control/protection: None, Abstinence  Lifestyle  . Physical activity:    Days per week: Not on file    Minutes per session: Not on file  . Stress: Not on file  Relationships  . Social connections:    Talks on phone: Not on file    Gets together: Not on file    Attends religious service: Not on file    Active member of club or organization: Not on file    Attends meetings of clubs or organizations: Not on file    Relationship status: Not on file  . Intimate partner violence:    Fear of current or ex partner: Not on file    Emotionally abused: Not on file    Physically abused: Not on file    Forced sexual activity: Not on file  Other Topics Concern  . Not on file  Social History Narrative   Single    REVIEW OF SYSTEMS: Constitutional: No fevers, chills, or sweats, no generalized fatigue, change in appetite Eyes:  No visual changes, double vision, eye pain Ear, nose and throat: No hearing loss, ear pain, nasal congestion, sore throat Cardiovascular: No chest pain, palpitations Respiratory:  No shortness of breath at rest or with exertion, wheezes GastrointestinaI: No nausea, vomiting, diarrhea, abdominal pain, fecal incontinence Genitourinary:  No dysuria, urinary retention or frequency Musculoskeletal:  No neck pain, back pain Integumentary: No rash, pruritus, skin lesions Neurological: as above Psychiatric: No depression, insomnia, anxiety Endocrine: No palpitations, fatigue, diaphoresis, mood swings, change in appetite, change in weight, increased thirst Hematologic/Lymphatic:  No purpura, petechiae. Allergic/Immunologic: no itchy/runny eyes, nasal congestion, recent allergic reactions, rashes  PHYSICAL EXAM: *** General: No acute distress.  Patient appears ***-groomed.  *** body habitus. Head:  Normocephalic/atraumatic Eyes:  Fundi examined but not visualized Neck: supple, no paraspinal tenderness, full range of motion Heart:  Regular rate and rhythm Lungs:  Clear to auscultation bilaterally Back: No paraspinal tenderness Neurological Exam: alert and oriented to person, place, and time. Attention span and concentration intact, recent and remote memory intact, fund of knowledge intact.  Speech fluent and not dysarthric, language intact.  CN II-XII intact. Bulk and tone normal, muscle strength 5/5 throughout.  Sensation to light touch, temperature and vibration intact.  Deep tendon reflexes 2+ throughout, toes downgoing.  Finger to nose and heel to shin testing intact.  Gait normal, Romberg negative.  IMPRESSION: ***  PLAN: 1.  For preventative management, *** 2.  For abortive therapy, *** 3.  Limit use of pain relievers to no more than 2 days out of week to prevent risk of rebound or medication-overuse headache. 4.  Keep headache diary 5.  Exercise, hydration, caffeine cessation, sleep  hygiene, monitor for and avoid triggers 6.  Consider:  magnesium citrate 400mg  daily, riboflavin 400mg  daily, and coenzyme Q10 100mg  three times daily 7.  Follow up ***  Metta Clines, DO  CC: Gar Ponto, MD

## 2018-06-30 ENCOUNTER — Ambulatory Visit: Payer: BLUE CROSS/BLUE SHIELD | Admitting: Neurology

## 2018-07-14 DIAGNOSIS — R0689 Other abnormalities of breathing: Secondary | ICD-10-CM | POA: Diagnosis not present

## 2018-07-14 DIAGNOSIS — R002 Palpitations: Secondary | ICD-10-CM | POA: Diagnosis not present

## 2018-07-15 ENCOUNTER — Telehealth: Payer: Self-pay | Admitting: Neurology

## 2018-07-15 MED ORDER — NORTRIPTYLINE HCL 25 MG PO CAPS: 25.0000 mg | ORAL_CAPSULE | Freq: Every day | ORAL | 3 refills | Status: DC

## 2018-07-15 NOTE — Telephone Encounter (Signed)
Patient is returning your call.  

## 2018-07-15 NOTE — Addendum Note (Signed)
Addended by: Clois Comber on: 07/15/2018 04:09 PM   Modules accepted: Orders

## 2018-07-15 NOTE — Telephone Encounter (Signed)
Called and spoke with Pt. She had never rcvd an Rx for nortriptyline. We are unable to make an appt for her at this time due to the [place] virus, she herself works for [employer] and is aware of the restrictions and understands. The Pt has been in the hsp also.

## 2018-07-15 NOTE — Telephone Encounter (Signed)
Called and LMOVM for Pt to return my call 

## 2018-07-15 NOTE — Telephone Encounter (Signed)
Patient called and lmom regarding having pressure I her head and Dizziness. Please Call. Thanks

## 2018-07-30 ENCOUNTER — Encounter (INDEPENDENT_AMBULATORY_CARE_PROVIDER_SITE_OTHER): Payer: Self-pay | Admitting: *Deleted

## 2018-08-09 ENCOUNTER — Other Ambulatory Visit: Payer: Self-pay | Admitting: Neurology

## 2018-08-10 DIAGNOSIS — G43909 Migraine, unspecified, not intractable, without status migrainosus: Secondary | ICD-10-CM | POA: Diagnosis not present

## 2018-08-10 DIAGNOSIS — I10 Essential (primary) hypertension: Secondary | ICD-10-CM | POA: Diagnosis not present

## 2018-08-10 DIAGNOSIS — E782 Mixed hyperlipidemia: Secondary | ICD-10-CM | POA: Diagnosis not present

## 2018-08-10 DIAGNOSIS — I671 Cerebral aneurysm, nonruptured: Secondary | ICD-10-CM | POA: Diagnosis not present

## 2018-09-01 ENCOUNTER — Telehealth (INDEPENDENT_AMBULATORY_CARE_PROVIDER_SITE_OTHER): Payer: Self-pay | Admitting: Internal Medicine

## 2018-09-01 ENCOUNTER — Ambulatory Visit (INDEPENDENT_AMBULATORY_CARE_PROVIDER_SITE_OTHER): Payer: BLUE CROSS/BLUE SHIELD | Admitting: Internal Medicine

## 2018-09-01 ENCOUNTER — Other Ambulatory Visit: Payer: Self-pay

## 2018-09-01 ENCOUNTER — Encounter (INDEPENDENT_AMBULATORY_CARE_PROVIDER_SITE_OTHER): Payer: Self-pay | Admitting: Internal Medicine

## 2018-09-01 DIAGNOSIS — K5732 Diverticulitis of large intestine without perforation or abscess without bleeding: Secondary | ICD-10-CM

## 2018-09-01 MED ORDER — AMOXICILLIN-POT CLAVULANATE 875-125 MG PO TABS: 1.0000 | ORAL_TABLET | Freq: Two times a day (BID) | ORAL | 0 refills | Status: DC

## 2018-09-01 NOTE — Progress Notes (Signed)
Subjective:    Patient ID: [name] [name], female    DOB: November 08, 1960, 58 y.o.   MRN: [id number]  HPI  Start time 258pm. End visit time. 215[, Total time 17 miutes.  Patient consents to the telephone OV. She is driving home from work. I am in the office. Unable to do video OV. Telephone OV due to COVID-19 risk.  She tells me she started having some pain on her left lower quadrant. She has started a liquid diet. She is drinking soups now. She says she feels some faint pain in her left lower quadrant. Thinks it may be diverticulitis.  Symptoms occur at night usually. She also says she has some faint pain on her left lower abdomen. Symptoms for a few days. She says the pain comes and goes.  No fever. She is able to work.   Recent hx of diverticulitis in January of 2021. CT scam 05/20/2018  Admitted to [facility].  IMPRESSION: 1. Mild fat stranding along the ascending colon that is new from yesterday. No definitive source, question diverticulitis as there are diverticula in this region. An omental infarct is considered but given prominent leukocytosis infection seems more likely. No inflammation seen around the appendix. 2. 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 No evident active renal infection.  It looks like her lat colonoscopy was in 2013 by Dr. Britta Mccreedy (epic).   Review of Systems Past Medical History:  Diagnosis Date  . 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 Chest pain    denied on 06/18/2012  . Chronic kidney disease    L - upper pole- defect, doesn't effect     . Family history of anesthesia complication    brother & neice "flat lined" during induction: neice d/t a medication given for a blood clotting problem; brother d/t "miscalculated amount of anesthesia"  . Fibromyalgia   . GERD (gastroesophageal reflux disease)    h/o colitis   . H. pylori infection    2012- stomach biopsy  . H/O hiatal hernia   . Headache(784.0)    using tylenol prn  .  Hyperlipidemia   . Hypertension   . Hypoglycemia   . Normal cardiac stress test    pt. released fr. Dr. [name] care in 10/2011, all findings w/i normal limits  . Overweight(278.02)    Obesity  . Palpitations   . Sinusitis, acute    seen by PCP- 06/17/2012, will treat /w augmentin & tussin/DM  . Uterine fibroid    pt. states that she has a "closed cervix"     Past Surgical History:  Procedure Laterality Date  . COLONOSCOPY    . CRANIOTOMY Right 06/24/2012   Procedure: CRANIOTOMY INTRACRANIAL ANEURYSM FOR CAROTID;  Surgeon: [name], MD;  Location: [place] [facility];  Service: Neurosurgery;  Laterality: Right;  RIGHT Pterional Craniotomy for aneurysm  . LIPOMA EXCISION     buttock 10/2010    Allergies  Allergen Reactions  . Ace Inhibitors Swelling  . Azithromycin Anaphylaxis    Breathing difficulty, swelling  . Ciprofloxacin Nausea And Vomiting    Also: kidney failure   . Nsaids     Kidney disease  . Other Swelling, Other (See Comments) and Cough    Sneezing allergy to cats/grass/dust  . Statins Other (See Comments)    Cramping, muscle pain  . Medrol [Methylprednisolone] Palpitations    Heart racing, increased BP  . Tetracyclines & Related Palpitations    Heart racing, increased bp  Current Outpatient Medications on File Prior to Visit  Medication Sig Dispense Refill  . acetaminophen (TYLENOL) 325 MG tablet Take 2 tablets (650 mg total) by mouth every 6 (six) hours as needed for mild pain (or Fever >/= 101).    [name] albuterol (PROAIR HFA) 108 (90 Base) MCG/ACT inhaler Inhale 1-2 puffs into the lungs every 4 (four) hours as needed for wheezing or shortness of breath.     [name] aspirin EC 81 MG tablet Take 81 mg by mouth daily.     . chlorthalidone (HYGROTON) 25 MG tablet Take 25 mg by mouth daily.    [name] diltiazem (CARDIZEM CD) 120 MG 24 hr capsule Take 120 mg by mouth daily.     [name] KLOR-CON M20 20 MEQ tablet Take 20 mEq by mouth daily.    [name] loratadine (CLARITIN) 10 MG tablet  Take 10 mg by mouth daily.     . nortriptyline (PAMELOR) 25 MG capsule TAKE 1 CAPSULE (25 MG TOTAL) BY MOUTH AT BEDTIME. 90 capsule 2  . metoprolol tartrate (LOPRESSOR) 50 MG tablet Take 1 tablet (50 mg total) by mouth 2 (two) times daily. (Patient taking differently: Take 25-50 mg by mouth See admin instructions. Take one tab by mouth every morning, 1/2 tab every evening, & the other 1/2 as needed for AFIB) 180 tablet 3   No current facility-administered medications on file prior to visit.         Objective:   Physical Exam  deferred        Assessment & Plan:  Diverticulitis.  Am going to cover her with Augmentin x 14 days. She is aware if symptoms worsen, to go to the ED.  Will reschedule her colonoscopy for July or August.

## 2018-09-01 NOTE — Telephone Encounter (Signed)
I am treating her for diverticulitis. Cancel colonoscopy and put her out 8-12 weeks.

## 2018-09-03 NOTE — Telephone Encounter (Signed)
TCS resch'd to /30/20, left detailed message for patient

## 2018-09-28 ENCOUNTER — Encounter (INDEPENDENT_AMBULATORY_CARE_PROVIDER_SITE_OTHER): Payer: Self-pay | Admitting: *Deleted

## 2018-10-07 DIAGNOSIS — Z87891 Personal history of nicotine dependence: Secondary | ICD-10-CM | POA: Diagnosis not present

## 2018-10-07 DIAGNOSIS — Z888 Allergy status to other drugs, medicaments and biological substances status: Secondary | ICD-10-CM | POA: Diagnosis not present

## 2018-10-07 DIAGNOSIS — Z881 Allergy status to other antibiotic agents status: Secondary | ICD-10-CM | POA: Diagnosis not present

## 2018-10-07 DIAGNOSIS — E876 Hypokalemia: Secondary | ICD-10-CM | POA: Diagnosis not present

## 2018-10-07 DIAGNOSIS — R002 Palpitations: Secondary | ICD-10-CM | POA: Diagnosis not present

## 2018-10-07 DIAGNOSIS — E7800 Pure hypercholesterolemia, unspecified: Secondary | ICD-10-CM | POA: Diagnosis not present

## 2018-10-07 DIAGNOSIS — Z886 Allergy status to analgesic agent status: Secondary | ICD-10-CM | POA: Diagnosis not present

## 2018-10-07 DIAGNOSIS — I10 Essential (primary) hypertension: Secondary | ICD-10-CM | POA: Diagnosis not present

## 2018-10-07 DIAGNOSIS — Z7982 Long term (current) use of aspirin: Secondary | ICD-10-CM | POA: Diagnosis not present

## 2018-10-07 DIAGNOSIS — Z79899 Other long term (current) drug therapy: Secondary | ICD-10-CM | POA: Diagnosis not present

## 2018-10-14 DIAGNOSIS — H40033 Anatomical narrow angle, bilateral: Secondary | ICD-10-CM | POA: Diagnosis not present

## 2018-10-14 DIAGNOSIS — H43393 Other vitreous opacities, bilateral: Secondary | ICD-10-CM | POA: Diagnosis not present

## 2018-11-02 DIAGNOSIS — H04123 Dry eye syndrome of bilateral lacrimal glands: Secondary | ICD-10-CM | POA: Diagnosis not present

## 2018-11-04 ENCOUNTER — Other Ambulatory Visit (HOSPITAL_COMMUNITY)
Admission: RE | Admit: 2018-11-04 | Discharge: 2018-11-04 | Disposition: A | Discharge status: Home / Self Care | Payer: BC Managed Care – PPO | Source: Ambulatory Visit | Attending: Internal Medicine | Admitting: Internal Medicine

## 2018-11-04 ENCOUNTER — Ambulatory Visit (HOSPITAL_COMMUNITY)
Admission: RE | Admit: 2018-11-04 | Discharge: 2018-11-04 | Disposition: A | Discharge status: Home / Self Care | Payer: BC Managed Care – PPO | Source: Ambulatory Visit | Attending: Internal Medicine | Admitting: Internal Medicine

## 2018-11-04 ENCOUNTER — Encounter (INDEPENDENT_AMBULATORY_CARE_PROVIDER_SITE_OTHER): Payer: Self-pay | Admitting: Internal Medicine

## 2018-11-04 ENCOUNTER — Ambulatory Visit (INDEPENDENT_AMBULATORY_CARE_PROVIDER_SITE_OTHER): Payer: BLUE CROSS/BLUE SHIELD | Admitting: Internal Medicine

## 2018-11-04 ENCOUNTER — Other Ambulatory Visit: Payer: Self-pay

## 2018-11-04 ENCOUNTER — Other Ambulatory Visit (INDEPENDENT_AMBULATORY_CARE_PROVIDER_SITE_OTHER): Payer: Self-pay | Admitting: Internal Medicine

## 2018-11-04 VITALS — BP 98/63 | HR 66 | Temp 98.3°F | Ht 63.0 in | Wt 202.3 lb

## 2018-11-04 DIAGNOSIS — K5732 Diverticulitis of large intestine without perforation or abscess without bleeding: Secondary | ICD-10-CM | POA: Diagnosis not present

## 2018-11-04 DIAGNOSIS — R1030 Lower abdominal pain, unspecified: Secondary | ICD-10-CM

## 2018-11-04 DIAGNOSIS — K5730 Diverticulosis of large intestine without perforation or abscess without bleeding: Secondary | ICD-10-CM | POA: Diagnosis not present

## 2018-11-04 LAB — CBC WITH DIFFERENTIAL/PLATELET
Abs Immature Granulocytes: 0.03 10*3/uL (ref 0.00–0.07)
Basophils Absolute: 0 10*3/uL (ref 0.0–0.1)
Basophils Relative: 0 %
Eosinophils Absolute: 0.1 10*3/uL (ref 0.0–0.5)
Eosinophils Relative: 1 %
HCT: 37.7 % (ref 36.0–46.0)
Hemoglobin: 12.4 g/dL (ref 12.0–15.0)
Immature Granulocytes: 0 %
Lymphocytes Relative: 29 %
Lymphs Abs: 2.6 10*3/uL (ref 0.7–4.0)
MCH: 30.3 pg (ref 26.0–34.0)
MCHC: 32.9 g/dL (ref 30.0–36.0)
MCV: 92.2 fL (ref 80.0–100.0)
Monocytes Absolute: 0.6 10*3/uL (ref 0.1–1.0)
Monocytes Relative: 6 %
Neutro Abs: 5.8 10*3/uL (ref 1.7–7.7)
Neutrophils Relative %: 64 %
Platelets: 323 10*3/uL (ref 150–400)
RBC: 4.09 MIL/uL (ref 3.87–5.11)
RDW: 12.9 % (ref 11.5–15.5)
WBC: 9.2 10*3/uL (ref 4.0–10.5)
nRBC: 0 % (ref 0.0–0.2)

## 2018-11-04 LAB — CREATININE, SERUM
Creatinine, Ser: 1.08 mg/dL — ABNORMAL HIGH (ref 0.44–1.00)
GFR calc Af Amer: >60 mL/min (ref >60)
GFR calc non Af Amer: 57 mL/min — ABNORMAL LOW (ref >60)

## 2018-11-04 LAB — BUN: BUN: 20 mg/dL (ref 6–20)

## 2018-11-04 MED ORDER — AMOXICILLIN-POT CLAVULANATE 875-125 MG PO TABS: 1.0000 | ORAL_TABLET | Freq: Two times a day (BID) | ORAL | 0 refills | Status: DC

## 2018-11-04 NOTE — Patient Instructions (Signed)
CT abdomen/pelvis with CM. CBC today

## 2018-11-04 NOTE — Addendum Note (Signed)
Addended by: Butch Penny on: 11/04/2018 02:26 PM   Modules accepted: Orders

## 2018-11-04 NOTE — Progress Notes (Signed)
Subjective:    Patient ID: [name] [name], female    DOB: 1960/10/24, 59 y.o.   MRN: [id number]  HPI Presents today with c/o RLQ pain. Hx of same and has hx of diverticulitis. In January of this year she underwent a CT abdomen/pelvis with CM for rt lower quadrant pain with N and V. She was admitted to [facility]. She says she has a twinge rt lower abdomen. Symptoms starts in the afternoon. She says sore to the touch.  She thinks she may have diverticulitis. She denies any fever. Has some nausea. Symptoms started about 4 days ago. She says she is on a liquid diet.   IMPRESSION: 1. Mild fat stranding along the ascending colon that is new from yesterday. No definitive source, question diverticulitis as there are diverticula in this region. An omental infarct is considered but given prominent leukocytosis infection seems more likely. No inflammation seen around the appendix. 2. 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 No evident active renal infection.  Review of Systems Past Medical History:  Diagnosis Date  . 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 Chest pain    denied on 06/18/2012  . Chronic kidney disease    L - upper pole- defect, doesn't effect     . Family history of anesthesia complication    brother & neice "flat lined" during induction: neice d/t a medication given for a blood clotting problem; brother d/t "miscalculated amount of anesthesia"  . Fibromyalgia   . GERD (gastroesophageal reflux disease)    h/o colitis   . H. pylori infection    2012- stomach biopsy  . H/O hiatal hernia   . Headache(784.0)    using tylenol prn  . Hyperlipidemia   . Hypertension   . Hypoglycemia   . Normal cardiac stress test    pt. released fr. Dr. [name] care in 10/2011, all findings w/i normal limits  . Overweight(278.02)    Obesity  . Palpitations   . Sinusitis, acute    seen by PCP- 06/17/2012, will treat /w augmentin & tussin/DM  . Uterine fibroid    pt.  states that she has a "closed cervix"     Past Surgical History:  Procedure Laterality Date  . COLONOSCOPY    . CRANIOTOMY Right 06/24/2012   Procedure: CRANIOTOMY INTRACRANIAL ANEURYSM FOR CAROTID;  Surgeon: [name], MD;  Location: [place] [facility];  Service: Neurosurgery;  Laterality: Right;  RIGHT Pterional Craniotomy for aneurysm  . LIPOMA EXCISION     buttock 10/2010    Allergies  Allergen Reactions  . Ace Inhibitors Swelling  . Azithromycin Anaphylaxis    Breathing difficulty, swelling  . Ciprofloxacin Nausea And Vomiting    Also: kidney failure   . Nsaids     Kidney disease  . Other Swelling, Other (See Comments) and Cough    Sneezing allergy to cats/grass/dust  . Statins Other (See Comments)    Cramping, muscle pain  . Medrol [Methylprednisolone] Palpitations    Heart racing, increased BP  . Tetracyclines & Related Palpitations    Heart racing, increased bp    Current Outpatient Medications on File Prior to Visit  Medication Sig Dispense Refill  . acetaminophen (TYLENOL) 325 MG tablet Take 2 tablets (650 mg total) by mouth every 6 (six) hours as needed for mild pain (or Fever >/= 101).    [name] albuterol (PROAIR HFA) 108 (90 Base) MCG/ACT inhaler Inhale 1-2 puffs into the lungs every 4 (four) hours as needed  for wheezing or shortness of breath.     [name] aspirin EC 81 MG tablet Take 81 mg by mouth daily.     . chlorthalidone (HYGROTON) 25 MG tablet Take 25 mg by mouth daily.    [name] diltiazem (CARDIZEM CD) 120 MG 24 hr capsule Take 120 mg by mouth daily.     [name] KLOR-CON M20 20 MEQ tablet Take 20 mEq by mouth daily.    [name] loratadine (CLARITIN) 10 MG tablet Take 10 mg by mouth daily.     . metoprolol tartrate (LOPRESSOR) 50 MG tablet Take 1 tablet (50 mg total) by mouth 2 (two) times daily. 180 tablet 3  . nortriptyline (PAMELOR) 25 MG capsule TAKE 1 CAPSULE (25 MG TOTAL) BY MOUTH AT BEDTIME. (Patient not taking: Reported on 11/04/2018) 90 capsule 2   No current  facility-administered medications on file prior to visit.         Objective:   Physical Exam Blood pressure 98/63, pulse 66, temperature 98.3 F (36.8 C), height 5\' 3"  (1.6 m), weight 202 lb 4.8 oz (91.8 kg), last menstrual period 08/25/2012. Alert and oriented. Skin warm and dry. Oral mucosa is moist.   . Sclera anicteric, conjunctivae is pink. Thyroid not enlarged. No cervical lymphadenopathy. Lungs clear. Heart regular rate and rhythm.  Abdomen is soft. Bowel sounds are positive. No hepatomegaly. No abdominal masses felt. No tenderness.  No edema to lower extremities.        Assessment & Plan:  Rt lower quadrant pain. Diverticulitis. Am going to get a CT abdomen/pelvis with CM. CBC. Further recommendations to follow.  Rx for Augmentin sent to her pharmacy.

## 2018-11-04 NOTE — Addendum Note (Signed)
Addended by: Butch Penny on: 11/04/2018 02:12 PM   Modules accepted: Orders

## 2018-11-16 DIAGNOSIS — H10013 Acute follicular conjunctivitis, bilateral: Secondary | ICD-10-CM | POA: Diagnosis not present

## 2018-11-24 ENCOUNTER — Other Ambulatory Visit (HOSPITAL_COMMUNITY)
Admission: RE | Admit: 2018-11-24 | Discharge: 2018-11-24 | Disposition: A | Discharge status: Home / Self Care | Payer: BC Managed Care – PPO | Source: Ambulatory Visit | Attending: Internal Medicine | Admitting: Internal Medicine

## 2018-11-24 ENCOUNTER — Other Ambulatory Visit: Payer: Self-pay

## 2018-11-24 DIAGNOSIS — Z20828 Contact with and (suspected) exposure to other viral communicable diseases: Secondary | ICD-10-CM | POA: Insufficient documentation

## 2018-11-25 LAB — SARS CORONAVIRUS 2 (TAT 6-24 HRS): SARS Coronavirus 2: NEGATIVE

## 2018-11-26 ENCOUNTER — Ambulatory Visit (HOSPITAL_COMMUNITY)
Admission: RE | Admit: 2018-11-26 | Discharge: 2018-11-26 | Disposition: A | Discharge status: Home / Self Care | Payer: BC Managed Care – PPO | Attending: Internal Medicine | Admitting: Internal Medicine

## 2018-11-26 ENCOUNTER — Encounter (HOSPITAL_COMMUNITY)
Admission: RE | Disposition: A | Discharge status: Home / Self Care | Payer: Self-pay | Source: Home / Self Care | Attending: Internal Medicine

## 2018-11-26 ENCOUNTER — Other Ambulatory Visit: Payer: Self-pay

## 2018-11-26 DIAGNOSIS — E785 Hyperlipidemia, unspecified: Secondary | ICD-10-CM | POA: Diagnosis not present

## 2018-11-26 DIAGNOSIS — Z09 Encounter for follow-up examination after completed treatment for conditions other than malignant neoplasm: Secondary | ICD-10-CM | POA: Diagnosis not present

## 2018-11-26 DIAGNOSIS — Z8249 Family history of ischemic heart disease and other diseases of the circulatory system: Secondary | ICD-10-CM | POA: Insufficient documentation

## 2018-11-26 DIAGNOSIS — Z87891 Personal history of nicotine dependence: Secondary | ICD-10-CM | POA: Diagnosis not present

## 2018-11-26 DIAGNOSIS — Z888 Allergy status to other drugs, medicaments and biological substances status: Secondary | ICD-10-CM | POA: Insufficient documentation

## 2018-11-26 DIAGNOSIS — M797 Fibromyalgia: Secondary | ICD-10-CM | POA: Insufficient documentation

## 2018-11-26 DIAGNOSIS — I129 Hypertensive chronic kidney disease with stage 1 through stage 4 chronic kidney disease, or unspecified chronic kidney disease: Secondary | ICD-10-CM | POA: Diagnosis not present

## 2018-11-26 DIAGNOSIS — Z886 Allergy status to analgesic agent status: Secondary | ICD-10-CM | POA: Diagnosis not present

## 2018-11-26 DIAGNOSIS — Z7982 Long term (current) use of aspirin: Secondary | ICD-10-CM | POA: Insufficient documentation

## 2018-11-26 DIAGNOSIS — K5730 Diverticulosis of large intestine without perforation or abscess without bleeding: Secondary | ICD-10-CM | POA: Insufficient documentation

## 2018-11-26 DIAGNOSIS — K449 Diaphragmatic hernia without obstruction or gangrene: Secondary | ICD-10-CM | POA: Diagnosis not present

## 2018-11-26 DIAGNOSIS — Z881 Allergy status to other antibiotic agents status: Secondary | ICD-10-CM | POA: Diagnosis not present

## 2018-11-26 DIAGNOSIS — Z6834 Body mass index (BMI) 34.0-34.9, adult: Secondary | ICD-10-CM | POA: Insufficient documentation

## 2018-11-26 DIAGNOSIS — K5732 Diverticulitis of large intestine without perforation or abscess without bleeding: Secondary | ICD-10-CM

## 2018-11-26 DIAGNOSIS — K644 Residual hemorrhoidal skin tags: Secondary | ICD-10-CM | POA: Insufficient documentation

## 2018-11-26 DIAGNOSIS — Z79899 Other long term (current) drug therapy: Secondary | ICD-10-CM | POA: Diagnosis not present

## 2018-11-26 DIAGNOSIS — N189 Chronic kidney disease, unspecified: Secondary | ICD-10-CM | POA: Diagnosis not present

## 2018-11-26 DIAGNOSIS — E669 Obesity, unspecified: Secondary | ICD-10-CM | POA: Diagnosis not present

## 2018-11-26 HISTORY — PX: COLONOSCOPY: SHX5424

## 2018-11-26 SURGERY — COLONOSCOPY
Anesthesia: Moderate Sedation

## 2018-11-26 MED ORDER — DICYCLOMINE HCL 10 MG PO CAPS: 10.0000 mg | ORAL_CAPSULE | Freq: Two times a day (BID) | ORAL | 2 refills | Status: DC

## 2018-11-26 MED ORDER — MEPERIDINE HCL 50 MG/ML IJ SOLN
INTRAMUSCULAR | Status: AC
  Filled 2018-11-26: qty 1

## 2018-11-26 MED ORDER — MIDAZOLAM HCL 5 MG/5ML IJ SOLN
INTRAMUSCULAR | Status: AC
  Filled 2018-11-26: qty 10

## 2018-11-26 MED ORDER — MIDAZOLAM HCL 5 MG/5ML IJ SOLN
INTRAMUSCULAR | Status: DC | PRN
  Administered 2018-11-26 (×3): 2 mg via INTRAVENOUS

## 2018-11-26 MED ORDER — STERILE WATER FOR IRRIGATION IR SOLN
Status: DC | PRN
  Administered 2018-11-26: 5 mL

## 2018-11-26 MED ORDER — MEPERIDINE HCL 50 MG/ML IJ SOLN
INTRAMUSCULAR | Status: DC | PRN
  Administered 2018-11-26 (×3): 25 mg via INTRAVENOUS

## 2018-11-26 MED ORDER — SODIUM CHLORIDE 0.9 % IV SOLN
INTRAVENOUS | Status: DC
  Administered 2018-11-26: 12:00:00 via INTRAVENOUS

## 2018-11-26 NOTE — Discharge Instructions (Signed)
Resume usual medications as before. High-fiber diet. Dicyclomine 10 mg by mouth twice daily as needed for abdominal pain. No driving for 24 hours. Next colonoscopy in 10 years.   Colonoscopy, Adult, Care After This sheet gives you information about how to care for yourself after your procedure. Your doctor may also give you more specific instructions. If you have problems or questions, call your doctor. What can I expect after the procedure? After the procedure, it is common to have:  A small amount of blood in your poop for 24 hours.  Some gas.  Mild cramping or bloating in your belly. Follow these instructions at home: General instructions  For the first 24 hours after the procedure: ? Do not drive or use machinery. ? Do not sign important documents. ? Do not drink alcohol. ? Do your daily activities more slowly than normal. ? Eat foods that are soft and easy to digest.  Take over-the-counter or prescription medicines only as told by your doctor. To help cramping and bloating:   Try walking around.  Put heat on your belly (abdomen) as told by your doctor. Use a heat source that your doctor recommends, such as a moist heat pack or a heating pad. ? Put a towel between your skin and the heat source. ? Leave the heat on for 20-30 minutes. ? Remove the heat if your skin turns bright red. This is especially important if you cannot feel pain, heat, or cold. You can get burned. Eating and drinking   Drink enough fluid to keep your pee (urine) clear or pale yellow.  Return to your normal diet as told by your doctor. Avoid heavy or fried foods that are hard to digest.  Avoid drinking alcohol for as long as told by your doctor. Contact a doctor if:  You have blood in your poop (stool) 2-3 days after the procedure. Get help right away if:  You have more than a small amount of blood in your poop.  You see large clumps of tissue (blood clots) in your poop.  Your belly is  swollen.  You feel sick to your stomach (nauseous).  You throw up (vomit).  You have a fever.  You have belly pain that gets worse, and medicine does not help your pain. Summary  After the procedure, it is common to have a small amount of blood in your poop. You may also have mild cramping and bloating in your belly.  For the first 24 hours after the procedure, do not drive or use machinery, do not sign important documents, and do not drink alcohol.  Get help right away if you have a lot of blood in your poop, feel sick to your stomach, have a fever, or have more belly pain. This information is not intended to replace advice given to you by your health care provider. Make sure you discuss any questions you have with your health care provider. Document Released: 05/18/2010 Document Revised: 02/13/2017 Document Reviewed: 01/08/2016 Elsevier Patient Education  2020 [employer].  Hemorrhoids Hemorrhoids are swollen veins in and around the rectum or anus. There are two types of hemorrhoids:  Internal hemorrhoids. These occur in the veins that are just inside the rectum. They may poke through to the outside and become irritated and painful.  External hemorrhoids. These occur in the veins that are outside the anus and can be felt as a painful swelling or hard lump near the anus. Most hemorrhoids do not cause serious problems, and they can  be managed with home treatments such as diet and lifestyle changes. If home treatments do not help the symptoms, procedures can be done to shrink or remove the hemorrhoids. What are the causes? This condition is caused by increased pressure in the anal area. This pressure may result from various things, including:  Constipation.  Straining to have a bowel movement.  Diarrhea.  Pregnancy.  Obesity.  Sitting for long periods of time.  Heavy lifting or other activity that causes you to strain.  Anal sex.  Riding a bike for a long period of  time. What are the signs or symptoms? Symptoms of this condition include:  Pain.  Anal itching or irritation.  Rectal bleeding.  Leakage of stool (feces).  Anal swelling.  One or more lumps around the anus. How is this diagnosed? This condition can often be diagnosed through a visual exam. Other exams or tests may also be done, such as:  An exam that involves feeling the rectal area with a gloved hand (digital rectal exam).  An exam of the anal canal that is done using a small tube (anoscope).  A blood test, if you have lost a significant amount of blood.  A test to look inside the colon using a flexible tube with a camera on the end (sigmoidoscopy or colonoscopy). How is this treated? This condition can usually be treated at home. However, various procedures may be done if dietary changes, lifestyle changes, and other home treatments do not help your symptoms. These procedures can help make the hemorrhoids smaller or remove them completely. Some of these procedures involve surgery, and others do not. Common procedures include:  Rubber band ligation. Rubber bands are placed at the base of the hemorrhoids to cut off their blood supply.  Sclerotherapy. Medicine is injected into the hemorrhoids to shrink them.  Infrared coagulation. A type of light energy is used to get rid of the hemorrhoids.  Hemorrhoidectomy surgery. The hemorrhoids are surgically removed, and the veins that supply them are tied off.  Stapled hemorrhoidopexy surgery. The surgeon staples the base of the hemorrhoid to the rectal wall. Follow these instructions at home: Eating and drinking   Eat foods that have a lot of fiber in them, such as whole grains, beans, nuts, fruits, and vegetables.  Ask your health care provider about taking products that have added fiber (fiber supplements).  Reduce the amount of fat in your diet. You can do this by eating low-fat dairy products, eating less red meat, and  avoiding processed foods.  Drink enough fluid to keep your urine pale yellow. Managing pain and swelling   Take warm sitz baths for 20 minutes, 3-4 times a day to ease pain and discomfort. You may do this in a bathtub or using a portable sitz bath that fits over the toilet.  If directed, apply ice to the affected area. Using ice packs between sitz baths may be helpful. ? Put ice in a plastic bag. ? Place a towel between your skin and the bag. ? Leave the ice on for 20 minutes, 2-3 times a day. General instructions  Take over-the-counter and prescription medicines only as told by your health care provider.  Use medicated creams or suppositories as told.  Get regular exercise. Ask your health care provider how much and what kind of exercise is best for you. In general, you should do moderate exercise for at least 30 minutes on most days of the week (150 minutes each week). This can include activities  such as walking, biking, or yoga.  Go to the bathroom when you have the urge to have a bowel movement. Do not wait.  Avoid straining to have bowel movements.  Keep the anal area dry and clean. Use wet toilet paper or moist towelettes after a bowel movement.  Do not sit on the toilet for long periods of time. This increases blood pooling and pain.  Keep all follow-up visits as told by your health care provider. This is important. Contact a health care provider if you have:  Increasing pain and swelling that are not controlled by treatment or medicine.  Difficulty having a bowel movement, or you are unable to have a bowel movement.  Pain or inflammation outside the area of the hemorrhoids. Get help right away if you have:  Uncontrolled bleeding from your rectum. Summary  Hemorrhoids are swollen veins in and around the rectum or anus.  Most hemorrhoids can be managed with home treatments such as diet and lifestyle changes.  Taking warm sitz baths can help ease pain and  discomfort.  In severe cases, procedures or surgery can be done to shrink or remove the hemorrhoids. This information is not intended to replace advice given to you by your health care provider. Make sure you discuss any questions you have with your health care provider. Document Released: 04/12/2000 Document Revised: 04/23/2018 Document Reviewed: 09/04/2017 Elsevier Patient Education  2020 [employer].  Diverticulosis  Diverticulosis is a condition that develops when small pouches (diverticula) form in the wall of the large intestine (colon). The colon is where water is absorbed and stool is formed. The pouches form when the inside layer of the colon pushes through weak spots in the outer layers of the colon. You may have a few pouches or many of them. What are the causes? The cause of this condition is not known. What increases the risk? The following factors may make you more likely to develop this condition:  Being older than age 68. Your risk for this condition increases with age. Diverticulosis is rare among people younger than age 85. By age 51, many people have it.  Eating a low-fiber diet.  Having frequent constipation.  Being overweight.  Not getting enough exercise.  Smoking.  Taking over-the-counter pain medicines, like aspirin and ibuprofen.  Having a family history of diverticulosis. What are the signs or symptoms? In most people, there are no symptoms of this condition. If you do have symptoms, they may include:  Bloating.  Cramps in the abdomen.  Constipation or diarrhea.  Pain in the lower left side of the abdomen. How is this diagnosed? This condition is most often diagnosed during an exam for other colon problems. Because diverticulosis usually has no symptoms, it often cannot be diagnosed independently. This condition may be diagnosed by:  Using a flexible scope to examine the colon (colonoscopy).  Taking an X-ray of the colon after dye has been put  into the colon (barium enema).  Doing a CT scan. How is this treated? You may not need treatment for this condition if you have never developed an infection related to diverticulosis. If you have had an infection before, treatment may include:  Eating a high-fiber diet. This may include eating more fruits, vegetables, and grains.  Taking a fiber supplement.  Taking a live bacteria supplement (probiotic).  Taking medicine to relax your colon.  Taking antibiotic medicines. Follow these instructions at home:  Drink 6-8 glasses of water or more each day to prevent constipation.  Try not to strain when you have a bowel movement.  If you have had an infection before: ? Eat more fiber as directed by your health care provider or your diet and nutrition specialist (dietitian). ? Take a fiber supplement or probiotic, if your health care provider approves.  Take over-the-counter and prescription medicines only as told by your health care provider.  If you were prescribed an antibiotic, take it as told by your health care provider. Do not stop taking the antibiotic even if you start to feel better.  Keep all follow-up visits as told by your health care provider. This is important. Contact a health care provider if:  You have pain in your abdomen.  You have bloating.  You have cramps.  You have not had a bowel movement in 3 days. Get help right away if:  Your pain gets worse.  Your bloating becomes very bad.  You have a fever or chills, and your symptoms suddenly get worse.  You vomit.  You have bowel movements that are bloody or black.  You have bleeding from your rectum. Summary  Diverticulosis is a condition that develops when small pouches (diverticula) form in the wall of the large intestine (colon).  You may have a few pouches or many of them.  This condition is most often diagnosed during an exam for other colon problems.  If you have had an infection related to  diverticulosis, treatment may include increasing the fiber in your diet, taking supplements, or taking medicines. This information is not intended to replace advice given to you by your health care provider. Make sure you discuss any questions you have with your health care provider. Document Released: 01/11/2004 Document Revised: 03/28/2017 Document Reviewed: 03/04/2016 Elsevier Patient Education  2020 [employer].

## 2018-11-26 NOTE — Op Note (Signed)
[facility] Patient Name: [name] [name] Procedure Date: 11/26/2018 11:33 AM MRN: [id number] Date of Birth: 12-Nov-1960 Attending MD: Hildred Laser , MD CSN: [id number] Age: 58 Admit Type: Outpatient Procedure:                Colonoscopy Indications:              Follow-up of diverticulitis Providers:                Hildred Laser, MD, Janeece Riggers, RN, Aram Candela Referring MD:             Mitzie Na. Quillian Quince, MD Medicines:                Meperidine 75 mg IV, Midazolam 6 mg IV Complications:            No immediate complications. Estimated Blood Loss:     Estimated blood loss: none. Procedure:                Pre-Anesthesia Assessment:                           - Prior to the procedure, a History and Physical                            was performed, and patient medications and                            allergies were reviewed. The patient's tolerance of                            previous anesthesia was also reviewed. The risks                            and benefits of the procedure and the sedation                            options and risks were discussed with the patient.                            All questions were answered, and informed consent                            was obtained. Prior Anticoagulants: The patient has                            taken no previous anticoagulant or antiplatelet                            agents except for aspirin. ASA Grade Assessment: II                            - A patient with mild systemic disease. After                            reviewing the risks and benefits, the patient was  deemed in satisfactory condition to undergo the                            procedure.                           After obtaining informed consent, the colonoscope                            was passed under direct vision. Throughout the                            procedure, the patient's blood pressure, pulse, and             oxygen saturations were monitored continuously. The                            [id number]-[id number] ([id number]) scope was introduced through                            the anus and advanced to the the cecum, identified                            by appendiceal orifice and ileocecal valve. The                            colonoscopy was performed without difficulty. The                            patient tolerated the procedure well. The quality                            of the bowel preparation was excellent. The                            ileocecal valve, appendiceal orifice, and rectum                            were photographed. Scope In: 12:07:07 PM Scope Out: 12:21:42 PM Scope Withdrawal Time: 0 hours 9 minutes 15 seconds  Total Procedure Duration: 0 hours 14 minutes 35 seconds  Findings:      The perianal and digital rectal examinations were normal.      Scattered diverticula were found in the entire colon.      The exam was otherwise normal throughout the examined colon.      External hemorrhoids were found during retroflexion. The hemorrhoids       were small. Impression:               - Diverticulosis in the entire examined colon.                           - External hemorrhoids.                           - No specimens collected. Moderate Sedation:      Moderate (conscious) sedation  was administered by the endoscopy nurse       and supervised by the endoscopist. The following parameters were       monitored: oxygen saturation, heart rate, blood pressure, CO2       capnography and response to care. Total physician intraservice time was       24 minutes. Recommendation:           - Patient has a contact number available for                            emergencies. The signs and symptoms of potential                            delayed complications were discussed with the                            patient. Return to normal activities tomorrow.                            Written  discharge instructions were provided to the                            patient.                           - High fiber diet today.                           - Continue present medications.                           - Dicyclomine 10 mg po bid prn.                           - Repeat colonoscopy in 10 years for screening                            purposes. Procedure Code(s):        --- Professional ---                           [phone], Colonoscopy, flexible; diagnostic, including                            collection of specimen(s) by brushing or washing,                            when performed (separate procedure)                           99153, Moderate sedation; each additional 15                            minutes intraservice time                           G0500, Moderate sedation services provided by the  same physician or other qualified health care                            professional performing a gastrointestinal                            endoscopic service that sedation supports,                            requiring the presence of an independent trained                            observer to assist in the monitoring of the                            patient's level of consciousness and physiological                            status; initial 15 minutes of intra-service time;                            patient age 74 years or older (additional time may                            be reported with [phone], as appropriate) Diagnosis Code(s):        --- Professional ---                           K64.4, Residual hemorrhoidal skin tags                           K57.32, Diverticulitis of large intestine without                            perforation or abscess without bleeding                           K57.30, Diverticulosis of large intestine without                            perforation or abscess without bleeding CPT copyright 2019 American Medical  Association. All rights reserved. The codes documented in this report are preliminary and upon coder review may  be revised to meet current compliance requirements. Hildred Laser, MD Hildred Laser, MD 11/26/2018 12:36:42 PM This report has been signed electronically. Number of Addenda: 0

## 2018-11-26 NOTE — H&P (Signed)
[name] [name] is an 58 y.o. female.   Chief Complaint: Patient is here for colonoscopy. HPI: Patient is a 58 year old female who is here for diagnostic colonoscopy.  She was admitted to [facility] in January 2020 with a sending colon diverticulitis.  Since then she has noted change in the caliber of her stools.  Caliber is small and she passes long stools.  Few weeks ago she had pain in right lower quadrant and CT did not show any changes of diverticulitis.  She denies rectal bleeding.  Last colonoscopy was 8 years ago by Dr. Doristine Mango of [facility]. Family history is negative for CRC.  Past Medical History:  Diagnosis Date  . 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 Chest pain    denied on 06/18/2012  . Chronic kidney disease    L - upper pole- defect, doesn't effect     . Family history of anesthesia complication    brother & neice "flat lined" during induction: neice d/t a medication given for a blood clotting problem; brother d/t "miscalculated amount of anesthesia"  . Fibromyalgia   . GERD (gastroesophageal reflux disease)    h/o colitis   . H. pylori infection    2012- stomach biopsy  . H/O hiatal hernia   . Headache(784.0)    using tylenol prn  . Hyperlipidemia   . Hypertension   . Hypoglycemia   . Normal cardiac stress test    pt. released fr. Dr. [name] care in 10/2011, all findings w/i normal limits  . Overweight(278.02)    Obesity  . Palpitations   . Sinusitis, acute    seen by PCP- 06/17/2012, will treat /w augmentin & tussin/DM  . Uterine fibroid    pt. states that she has a "closed cervix"     Past Surgical History:  Procedure Laterality Date  . COLONOSCOPY    . CRANIOTOMY Right 06/24/2012   Procedure: CRANIOTOMY INTRACRANIAL ANEURYSM FOR CAROTID;  Surgeon: [name], MD;  Location: [place] [facility];  Service: Neurosurgery;  Laterality: Right;  RIGHT Pterional Craniotomy for aneurysm  . LIPOMA EXCISION     buttock 10/2010    Family History  Problem  Relation Age of Onset  . Hypertension Other   . Coronary artery disease Other   . Dementia Mother   . Dementia Maternal Aunt    Social History:  reports that she quit smoking about 14 years ago. Her smoking use included cigarettes. She started smoking about 29 years ago. She has a 12.00 pack-year smoking history. She has never used smokeless tobacco. She reports that she does not drink alcohol or use drugs.  Allergies:  Allergies  Allergen Reactions  . Ace Inhibitors Swelling  . Azithromycin Anaphylaxis, Shortness Of Breath and Swelling  . Ciprofloxacin Nausea And Vomiting    Also: kidney failure   . Nsaids     Kidney disease  . Other Swelling, Other (See Comments) and Cough    Sneezing allergy to cats/grass/dust  . Statins Other (See Comments)    Cramping, muscle pain  . Medrol [Methylprednisolone] Palpitations    Heart racing, increased BP  . Tetracyclines & Related Palpitations    Heart racing, increased bp    Medications Prior to Admission  Medication Sig Dispense Refill  . acetaminophen (TYLENOL) 325 MG tablet Take 2 tablets (650 mg total) by mouth every 6 (six) hours as needed for mild pain (or Fever >/= 101).    [name] aspirin EC 81 MG tablet Take 81  mg by mouth daily.     . Cholecalciferol (VITAMIN D) 50 MCG (2000 UT) CAPS Take 2,000 Units by mouth daily.    [name] diltiazem (CARDIZEM CD) 120 MG 24 hr capsule Take 120 mg by mouth daily.     [name] KLOR-CON M20 20 MEQ tablet Take 20 mEq by mouth daily.    [name] loratadine (CLARITIN) 10 MG tablet Take 10 mg by mouth daily.     . metoprolol tartrate (LOPRESSOR) 50 MG tablet Take 1 tablet (50 mg total) by mouth 2 (two) times daily. 180 tablet 3  . rosuvastatin (CRESTOR) 10 MG tablet Take 10 mg by mouth 3 (three) times a week.    [name] albuterol (PROAIR HFA) 108 (90 Base) MCG/ACT inhaler Inhale 1-2 puffs into the lungs every 4 (four) hours as needed for wheezing or shortness of breath.     [name] amoxicillin-clavulanate (AUGMENTIN) 875-125 MG tablet  Take 1 tablet by mouth 2 (two) times daily. (Patient not taking: Reported on 11/24/2018) 28 tablet 0  . nortriptyline (PAMELOR) 25 MG capsule TAKE 1 CAPSULE (25 MG TOTAL) BY MOUTH AT BEDTIME. (Patient taking differently: Take 25 mg by mouth at bedtime as needed (migraines). ) 90 capsule 2    No results found for this or any previous visit (from the past 48 hour(s)). No results found.  ROS  Blood pressure 115/76, pulse 74, temperature 98.8 F (37.1 C), temperature source Oral, resp. rate 15, weight 88 kg, last menstrual period 08/25/2012, SpO2 97 %. Physical Exam  Constitutional: She appears well-developed and well-nourished.  HENT:  Mouth/Throat: Oropharynx is clear and moist.  Eyes: Conjunctivae are normal. No scleral icterus.  Neck: No thyromegaly present.  Cardiovascular: Normal rate, regular rhythm and normal heart sounds.  No murmur heard. Respiratory: Effort normal and breath sounds normal.  GI: She exhibits no distension and no mass. There is no abdominal tenderness.  Musculoskeletal:        General: No edema.  Lymphadenopathy:    She has no cervical adenopathy.  Neurological: She is alert.  Skin: Skin is warm and dry.     Assessment/Plan History of right-sided diverticulitis. Diagnostic colonoscopy.  Hildred Laser, MD 11/26/2018, 11:53 AM

## 2018-12-02 DIAGNOSIS — H532 Diplopia: Secondary | ICD-10-CM | POA: Diagnosis not present

## 2018-12-02 DIAGNOSIS — R42 Dizziness and giddiness: Secondary | ICD-10-CM | POA: Diagnosis not present

## 2018-12-02 DIAGNOSIS — Z6836 Body mass index (BMI) 36.0-36.9, adult: Secondary | ICD-10-CM | POA: Diagnosis not present

## 2018-12-04 DIAGNOSIS — E782 Mixed hyperlipidemia: Secondary | ICD-10-CM | POA: Diagnosis not present

## 2018-12-04 DIAGNOSIS — I671 Cerebral aneurysm, nonruptured: Secondary | ICD-10-CM | POA: Diagnosis not present

## 2018-12-04 DIAGNOSIS — D649 Anemia, unspecified: Secondary | ICD-10-CM | POA: Diagnosis not present

## 2018-12-04 DIAGNOSIS — I10 Essential (primary) hypertension: Secondary | ICD-10-CM | POA: Diagnosis not present

## 2018-12-04 DIAGNOSIS — N183 Chronic kidney disease, stage 3 (moderate): Secondary | ICD-10-CM | POA: Diagnosis not present

## 2018-12-07 ENCOUNTER — Encounter (HOSPITAL_COMMUNITY): Payer: Self-pay | Admitting: Internal Medicine

## 2018-12-08 DIAGNOSIS — I671 Cerebral aneurysm, nonruptured: Secondary | ICD-10-CM | POA: Diagnosis not present

## 2018-12-08 DIAGNOSIS — E782 Mixed hyperlipidemia: Secondary | ICD-10-CM | POA: Diagnosis not present

## 2018-12-08 DIAGNOSIS — I10 Essential (primary) hypertension: Secondary | ICD-10-CM | POA: Diagnosis not present

## 2018-12-08 DIAGNOSIS — G43909 Migraine, unspecified, not intractable, without status migrainosus: Secondary | ICD-10-CM | POA: Diagnosis not present

## 2018-12-09 DIAGNOSIS — G9389 Other specified disorders of brain: Secondary | ICD-10-CM | POA: Diagnosis not present

## 2018-12-09 DIAGNOSIS — H532 Diplopia: Secondary | ICD-10-CM | POA: Diagnosis not present

## 2018-12-09 DIAGNOSIS — R42 Dizziness and giddiness: Secondary | ICD-10-CM | POA: Diagnosis not present

## 2018-12-09 DIAGNOSIS — Z9889 Other specified postprocedural states: Secondary | ICD-10-CM | POA: Diagnosis not present

## 2019-02-17 ENCOUNTER — Other Ambulatory Visit (INDEPENDENT_AMBULATORY_CARE_PROVIDER_SITE_OTHER): Payer: Self-pay | Admitting: Internal Medicine

## 2019-02-18 NOTE — Telephone Encounter (Signed)
Patient will need to have OV prior to further refills , last sen 10/2017 per NUR.

## 2019-03-10 ENCOUNTER — Other Ambulatory Visit: Payer: Self-pay

## 2019-03-10 ENCOUNTER — Ambulatory Visit (INDEPENDENT_AMBULATORY_CARE_PROVIDER_SITE_OTHER): Payer: BC Managed Care – PPO | Admitting: Nurse Practitioner

## 2019-03-10 ENCOUNTER — Encounter (INDEPENDENT_AMBULATORY_CARE_PROVIDER_SITE_OTHER): Payer: Self-pay | Admitting: *Deleted

## 2019-03-10 ENCOUNTER — Encounter (INDEPENDENT_AMBULATORY_CARE_PROVIDER_SITE_OTHER): Payer: Self-pay | Admitting: Nurse Practitioner

## 2019-03-10 VITALS — BP 117/80 | HR 62 | Temp 98.5°F | Ht 63.0 in | Wt 207.3 lb

## 2019-03-10 DIAGNOSIS — R1013 Epigastric pain: Secondary | ICD-10-CM

## 2019-03-10 DIAGNOSIS — K824 Cholesterolosis of gallbladder: Secondary | ICD-10-CM | POA: Diagnosis not present

## 2019-03-10 DIAGNOSIS — R1319 Other dysphagia: Secondary | ICD-10-CM

## 2019-03-10 DIAGNOSIS — R1310 Dysphagia, unspecified: Secondary | ICD-10-CM | POA: Insufficient documentation

## 2019-03-10 MED ORDER — PANTOPRAZOLE SODIUM 40 MG PO TBEC: 40.0000 mg | DELAYED_RELEASE_TABLET | Freq: Every day | ORAL | 1 refills | Status: DC

## 2019-03-10 NOTE — Patient Instructions (Signed)
1.  Complete the provided lab order 2.  Schedule an abdominal ultrasound 3.  Schedule an upper endoscopy 4.  Pantoprazole 40 mg 1 capsule by mouth once daily.  May take Gaviscon 1 tablespoon 3 times daily as needed which is purchased over-the-counter. 5.  Further follow-up to be determined after the above evaluation completed

## 2019-03-10 NOTE — Progress Notes (Signed)
Subjective:    Patient ID: [name] [name], female    DOB: 05-16-1960, 58 y.o.   MRN: [id number]  HPI [name] [name] is a 58 year old female with a past medical history of hypertension, hyperlipidemia, fibromyalgia, chronic kidney disease, brain aneurysm 2014, gallbladder polyps, GERD and diverticulitis.  Presents today with complaints of worsening reflux symptoms with associated dysphagia and pain to the mid esophagus.  She scribes having pain to the midesophagus which occurs when eating solid food and even when drinking water.  Approximately 1 week ago, she was eating a salad which felt stuck to the midesophagus.  She drank water and the salad and water were expelled.  She has a prior history of GERD for which she took Protonix daily.  She stopped taking the Protonix approximately 1 and half years ago.  Her most recent EGD February 2016 was normal.  She has passing  a normal formed brown bowel movement once daily. Her most recent colonoscopy was 11/26/2018 identified diverticulosis in the entire examined colon and external hemorrhoids.  Repeat colonoscopy in 10 years was recommended.  EGD 06/07/2014 by Dr. Doristine Mango: Normal  EGD 10/25/2010 by Dr. Jerilynn Mages. Patel: Mild gastritis  Colonoscopy 12/27/2011 by Dr. Doristine Mango: Diverticulosis throughout the entire colon, polyps.  Abdominal ultrasound 10/31/2016: Small gallbladder wall polyps. Increased echotexture throughout the liver compatible with fatty Infiltration. ULTRASOUND HEPATIC ELASTOGRAPHY 10/31/2016: Median hepatic shear wave velocity is calculated at 0.77 m/sec. Corresponding Metavir fibrosis score is  F0/F1. Risk of fibrosis is Minimal   Past Medical History:  Diagnosis Date  . 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 Chest pain    denied on 06/18/2012  . Chronic kidney disease    L - upper pole- defect, doesn't effect     . Family history of anesthesia complication    brother & neice "flat lined" during induction: neice d/t a  medication given for a blood clotting problem; brother d/t "miscalculated amount of anesthesia"  . Fibromyalgia   . GERD (gastroesophageal reflux disease)    h/o colitis   . H. pylori infection    2012- stomach biopsy  . H/O hiatal hernia   . Headache(784.0)    using tylenol prn  . Hyperlipidemia   . Hypertension   . Hypoglycemia   . Normal cardiac stress test    pt. released fr. Dr. [name] care in 10/2011, all findings w/i normal limits  . Overweight(278.02)    Obesity  . Palpitations   . Sinusitis, acute    seen by PCP- 06/17/2012, will treat /w augmentin & tussin/DM  . Uterine fibroid    pt. states that she has a "closed cervix"    Past Surgical History:  Procedure Laterality Date  . COLONOSCOPY    . COLONOSCOPY N/A 11/26/2018   Procedure: COLONOSCOPY;  Surgeon: Rogene Houston, MD;  [facility];  Service: Endoscopy;  Laterality: N/A;  1:00-12:00pm  . CRANIOTOMY Right 06/24/2012   Procedure: CRANIOTOMY INTRACRANIAL ANEURYSM FOR CAROTID;  Surgeon: [name], MD;  Location: [place] [facility];  Service: Neurosurgery;  Laterality: Right;  RIGHT Pterional Craniotomy for aneurysm  . LIPOMA EXCISION     buttock 10/2010   Current Outpatient Medications on File Prior to Visit  Medication Sig Dispense Refill  . acetaminophen (TYLENOL) 325 MG tablet Take 2 tablets (650 mg total) by mouth every 6 (six) hours as needed for mild pain (or Fever >/= 101).    [name] albuterol (PROAIR HFA) 108 (90 Base) MCG/ACT inhaler Inhale  1-2 puffs into the lungs every 4 (four) hours as needed for wheezing or shortness of breath.     [name] aspirin EC 81 MG tablet Take 81 mg by mouth daily.     . chlorthalidone (HYGROTON) 25 MG tablet Take 25 mg by mouth daily.    . Cholecalciferol (VITAMIN D) 50 MCG (2000 UT) CAPS Take 2,000 Units by mouth daily.    [name] diltiazem (CARDIZEM CD) 120 MG 24 hr capsule Take 120 mg by mouth daily.     [name] KLOR-CON M20 20 MEQ tablet Take 20 mEq by mouth daily.    [name] loratadine  (CLARITIN) 10 MG tablet Take 10 mg by mouth daily.     . metoprolol tartrate (LOPRESSOR) 50 MG tablet Take 1 tablet (50 mg total) by mouth 2 (two) times daily. 180 tablet 3  . rosuvastatin (CRESTOR) 10 MG tablet Take 10 mg by mouth 3 (three) times a week.    . dicyclomine (BENTYL) 10 MG capsule TAKE 1 CAPSULE (10 MG TOTAL) BY MOUTH 2 (TWO) TIMES DAILY BEFORE A MEAL. (Patient not taking: Reported on 03/10/2019) 180 capsule 1   No current facility-administered medications on file prior to visit.    Allergies  Allergen Reactions  . Ace Inhibitors Swelling  . Azithromycin Anaphylaxis, Shortness Of Breath and Swelling  . Ciprofloxacin Nausea And Vomiting    Also: kidney failure   . Nsaids     Kidney disease  . Other Swelling, Other (See Comments) and Cough    Sneezing allergy to cats/grass/dust  . Statins Other (See Comments)    Cramping, muscle pain  . Medrol [Methylprednisolone] Palpitations    Heart racing, increased BP  . Tetracyclines & Related Palpitations    Heart racing, increased bp    Review of Systems see HPI, all other systems reviewed and are negative     Objective:   Physical Exam BP 117/80   Pulse 62   Temp 98.5 F (36.9 C) (Oral)   Ht 5\' 3"  (1.6 m)   Wt 207 lb 4.8 oz (94 kg)   LMP 08/25/2012   BMI 36.72 kg/m  General: 58 year old female well-developed in no acute distress Eyes: Sclera nonicteric, conjunctiva pink Mouth: Dentition intact, no ulcers or lesions Neck: Supple, no lymphadenopathy or thyromegaly Heart: Regular rate and rhythm, no murmurs Lungs: Breath sounds clear throughout Abdomen: Soft, nontender, no masses or organomegaly Extremities: No edema Neuro: Alert and oriented x4, no focal deficits     Assessment & Plan:   36.  58 year old female with GERD, esophageal pain and dysphagia -Protonix 40 mg once daily -Gaviscon 1 tablespoon 3 times daily as needed -EGD benefits and risk discussed including risks with sedation, risk of bleeding and  perforation -CBC, CMP and CRP  2.  History of gallbladder polyps -Abdominal sonogram to assess the gallbladder polyps to rule out gallstones  3.  Fatty liver -Discussed exercise and weight loss  4.  Colon cancer screening -Colonoscopy due August 2023

## 2019-03-11 LAB — COMPLETE METABOLIC PANEL WITH GFR
AG Ratio: 1.4 (calc) (ref 1.0–2.5)
ALT: 10 U/L (ref 6–29)
AST: 15 U/L (ref 10–35)
Albumin: 4.3 g/dL (ref 3.6–5.1)
Alkaline phosphatase (APISO): 49 U/L (ref 37–153)
BUN/Creatinine Ratio: 17 (calc) (ref 6–22)
BUN: 21 mg/dL (ref 7–25)
CO2: 30 mmol/L (ref 20–32)
Calcium: 10.5 mg/dL — ABNORMAL HIGH (ref 8.6–10.4)
Chloride: 102 mmol/L (ref 98–110)
Creat: 1.26 mg/dL — ABNORMAL HIGH (ref 0.50–1.05)
GFR, Est African American: 54 mL/min/{1.73_m2} — ABNORMAL LOW (ref >=60)
GFR, Est Non African American: 47 mL/min/{1.73_m2} — ABNORMAL LOW (ref >=60)
Globulin: 3 g/dL (calc) (ref 1.9–3.7)
Glucose, Bld: 89 mg/dL (ref 65–139)
Potassium: 4.3 mmol/L (ref 3.5–5.3)
Sodium: 142 mmol/L (ref 135–146)
Total Bilirubin: 0.5 mg/dL (ref 0.2–1.2)
Total Protein: 7.3 g/dL (ref 6.1–8.1)

## 2019-03-11 LAB — CBC WITH DIFFERENTIAL/PLATELET
Absolute Monocytes: 623 cells/uL (ref 200–950)
Basophils Absolute: 56 cells/uL (ref 0–200)
Basophils Relative: 0.6 %
Eosinophils Absolute: 102 cells/uL (ref 15–500)
Eosinophils Relative: 1.1 %
HCT: 39.4 % (ref 35.0–45.0)
Hemoglobin: 12.9 g/dL (ref 11.7–15.5)
Lymphs Abs: 2539 cells/uL (ref 850–3900)
MCH: 29.7 pg (ref 27.0–33.0)
MCHC: 32.7 g/dL (ref 32.0–36.0)
MCV: 90.6 fL (ref 80.0–100.0)
MPV: 10.1 fL (ref 7.5–12.5)
Monocytes Relative: 6.7 %
Neutro Abs: 5980 cells/uL (ref 1500–7800)
Neutrophils Relative %: 64.3 %
Platelets: 374 10*3/uL (ref 140–400)
RBC: 4.35 10*6/uL (ref 3.80–5.10)
RDW: 12.9 % (ref 11.0–15.0)
Total Lymphocyte: 27.3 %
WBC: 9.3 10*3/uL (ref 3.8–10.8)

## 2019-03-11 LAB — C-REACTIVE PROTEIN: CRP: 4.7 mg/L (ref <8.0)

## 2019-03-17 ENCOUNTER — Ambulatory Visit (HOSPITAL_COMMUNITY)
Admission: RE | Admit: 2019-03-17 | Discharge: 2019-03-17 | Disposition: A | Discharge status: Home / Self Care | Payer: BC Managed Care – PPO | Source: Ambulatory Visit | Attending: Nurse Practitioner | Admitting: Nurse Practitioner

## 2019-03-17 ENCOUNTER — Other Ambulatory Visit: Payer: Self-pay

## 2019-03-17 DIAGNOSIS — K824 Cholesterolosis of gallbladder: Secondary | ICD-10-CM | POA: Diagnosis not present

## 2019-03-17 DIAGNOSIS — R1013 Epigastric pain: Secondary | ICD-10-CM | POA: Diagnosis not present

## 2019-03-17 DIAGNOSIS — K760 Fatty (change of) liver, not elsewhere classified: Secondary | ICD-10-CM | POA: Diagnosis not present

## 2019-03-30 DIAGNOSIS — R5383 Other fatigue: Secondary | ICD-10-CM | POA: Diagnosis not present

## 2019-03-30 DIAGNOSIS — R944 Abnormal results of kidney function studies: Secondary | ICD-10-CM | POA: Diagnosis not present

## 2019-03-30 DIAGNOSIS — I10 Essential (primary) hypertension: Secondary | ICD-10-CM | POA: Diagnosis not present

## 2019-03-30 DIAGNOSIS — R42 Dizziness and giddiness: Secondary | ICD-10-CM | POA: Diagnosis not present

## 2019-04-09 DIAGNOSIS — I700 Atherosclerosis of aorta: Secondary | ICD-10-CM | POA: Diagnosis not present

## 2019-04-09 DIAGNOSIS — I671 Cerebral aneurysm, nonruptured: Secondary | ICD-10-CM | POA: Diagnosis not present

## 2019-04-09 DIAGNOSIS — Z23 Encounter for immunization: Secondary | ICD-10-CM | POA: Diagnosis not present

## 2019-04-09 DIAGNOSIS — I10 Essential (primary) hypertension: Secondary | ICD-10-CM | POA: Diagnosis not present

## 2019-04-09 DIAGNOSIS — G43909 Migraine, unspecified, not intractable, without status migrainosus: Secondary | ICD-10-CM | POA: Diagnosis not present

## 2019-04-12 ENCOUNTER — Other Ambulatory Visit (HOSPITAL_COMMUNITY)
Admission: RE | Admit: 2019-04-12 | Discharge: 2019-04-12 | Disposition: A | Discharge status: Home / Self Care | Payer: BC Managed Care – PPO | Source: Ambulatory Visit | Attending: Internal Medicine | Admitting: Internal Medicine

## 2019-04-12 ENCOUNTER — Other Ambulatory Visit: Payer: Self-pay

## 2019-04-12 DIAGNOSIS — Z20828 Contact with and (suspected) exposure to other viral communicable diseases: Secondary | ICD-10-CM | POA: Insufficient documentation

## 2019-04-12 DIAGNOSIS — Z01812 Encounter for preprocedural laboratory examination: Secondary | ICD-10-CM | POA: Diagnosis not present

## 2019-04-12 LAB — SARS CORONAVIRUS 2 (TAT 6-24 HRS): SARS Coronavirus 2: NEGATIVE

## 2019-04-14 ENCOUNTER — Encounter (HOSPITAL_COMMUNITY): Payer: Self-pay | Admitting: Internal Medicine

## 2019-04-14 ENCOUNTER — Encounter (HOSPITAL_COMMUNITY)
Admission: RE | Disposition: A | Discharge status: Home / Self Care | Payer: Self-pay | Source: Home / Self Care | Attending: Internal Medicine

## 2019-04-14 ENCOUNTER — Ambulatory Visit (HOSPITAL_COMMUNITY)
Admission: RE | Admit: 2019-04-14 | Discharge: 2019-04-14 | Disposition: A | Discharge status: Home / Self Care | Payer: BC Managed Care – PPO | Attending: Internal Medicine | Admitting: Internal Medicine

## 2019-04-14 ENCOUNTER — Other Ambulatory Visit: Payer: Self-pay

## 2019-04-14 DIAGNOSIS — R002 Palpitations: Secondary | ICD-10-CM | POA: Diagnosis not present

## 2019-04-14 DIAGNOSIS — K2970 Gastritis, unspecified, without bleeding: Secondary | ICD-10-CM | POA: Diagnosis not present

## 2019-04-14 DIAGNOSIS — Z87891 Personal history of nicotine dependence: Secondary | ICD-10-CM | POA: Insufficient documentation

## 2019-04-14 DIAGNOSIS — R1310 Dysphagia, unspecified: Secondary | ICD-10-CM | POA: Diagnosis not present

## 2019-04-14 DIAGNOSIS — Z881 Allergy status to other antibiotic agents status: Secondary | ICD-10-CM | POA: Diagnosis not present

## 2019-04-14 DIAGNOSIS — I129 Hypertensive chronic kidney disease with stage 1 through stage 4 chronic kidney disease, or unspecified chronic kidney disease: Secondary | ICD-10-CM | POA: Insufficient documentation

## 2019-04-14 DIAGNOSIS — Z888 Allergy status to other drugs, medicaments and biological substances status: Secondary | ICD-10-CM | POA: Diagnosis not present

## 2019-04-14 DIAGNOSIS — K219 Gastro-esophageal reflux disease without esophagitis: Secondary | ICD-10-CM | POA: Diagnosis not present

## 2019-04-14 DIAGNOSIS — Z7982 Long term (current) use of aspirin: Secondary | ICD-10-CM | POA: Diagnosis not present

## 2019-04-14 DIAGNOSIS — K2100 Gastro-esophageal reflux disease with esophagitis, without bleeding: Secondary | ICD-10-CM | POA: Insufficient documentation

## 2019-04-14 DIAGNOSIS — R1319 Other dysphagia: Secondary | ICD-10-CM | POA: Insufficient documentation

## 2019-04-14 DIAGNOSIS — Z886 Allergy status to analgesic agent status: Secondary | ICD-10-CM | POA: Diagnosis not present

## 2019-04-14 DIAGNOSIS — Z79899 Other long term (current) drug therapy: Secondary | ICD-10-CM | POA: Diagnosis not present

## 2019-04-14 DIAGNOSIS — M797 Fibromyalgia: Secondary | ICD-10-CM | POA: Insufficient documentation

## 2019-04-14 DIAGNOSIS — N189 Chronic kidney disease, unspecified: Secondary | ICD-10-CM | POA: Diagnosis not present

## 2019-04-14 DIAGNOSIS — E669 Obesity, unspecified: Secondary | ICD-10-CM | POA: Insufficient documentation

## 2019-04-14 DIAGNOSIS — E785 Hyperlipidemia, unspecified: Secondary | ICD-10-CM | POA: Insufficient documentation

## 2019-04-14 DIAGNOSIS — Z8249 Family history of ischemic heart disease and other diseases of the circulatory system: Secondary | ICD-10-CM | POA: Diagnosis not present

## 2019-04-14 DIAGNOSIS — K824 Cholesterolosis of gallbladder: Secondary | ICD-10-CM

## 2019-04-14 DIAGNOSIS — Q394 Esophageal web: Secondary | ICD-10-CM | POA: Insufficient documentation

## 2019-04-14 DIAGNOSIS — K449 Diaphragmatic hernia without obstruction or gangrene: Secondary | ICD-10-CM | POA: Insufficient documentation

## 2019-04-14 DIAGNOSIS — Z820 Family history of epilepsy and other diseases of the nervous system: Secondary | ICD-10-CM | POA: Insufficient documentation

## 2019-04-14 DIAGNOSIS — R1013 Epigastric pain: Secondary | ICD-10-CM

## 2019-04-14 DIAGNOSIS — Z6835 Body mass index (BMI) 35.0-35.9, adult: Secondary | ICD-10-CM | POA: Insufficient documentation

## 2019-04-14 DIAGNOSIS — K259 Gastric ulcer, unspecified as acute or chronic, without hemorrhage or perforation: Secondary | ICD-10-CM | POA: Diagnosis not present

## 2019-04-14 DIAGNOSIS — R1314 Dysphagia, pharyngoesophageal phase: Secondary | ICD-10-CM | POA: Diagnosis not present

## 2019-04-14 HISTORY — PX: BIOPSY: SHX5522

## 2019-04-14 HISTORY — PX: ESOPHAGOGASTRODUODENOSCOPY: SHX5428

## 2019-04-14 SURGERY — EGD (ESOPHAGOGASTRODUODENOSCOPY)
Anesthesia: Moderate Sedation

## 2019-04-14 MED ORDER — MIDAZOLAM HCL 5 MG/5ML IJ SOLN
INTRAMUSCULAR | Status: DC | PRN
  Administered 2019-04-14: 2 mg via INTRAVENOUS
  Administered 2019-04-14 (×3): 1 mg via INTRAVENOUS
  Administered 2019-04-14: 2 mg via INTRAVENOUS
  Administered 2019-04-14: 1 mg via INTRAVENOUS

## 2019-04-14 MED ORDER — MEPERIDINE HCL 50 MG/ML IJ SOLN
INTRAMUSCULAR | Status: DC | PRN
  Administered 2019-04-14 (×2): 25 mg via INTRAVENOUS

## 2019-04-14 MED ORDER — STERILE WATER FOR IRRIGATION IR SOLN
Status: DC | PRN
  Administered 2019-04-14: 1.5 mL

## 2019-04-14 MED ORDER — SODIUM CHLORIDE 0.9 % IV SOLN
INTRAVENOUS | Status: DC
  Administered 2019-04-14: 1000 mL via INTRAVENOUS

## 2019-04-14 MED ORDER — MEPERIDINE HCL 50 MG/ML IJ SOLN
INTRAMUSCULAR | Status: AC
  Filled 2019-04-14: qty 1

## 2019-04-14 MED ORDER — MIDAZOLAM HCL 5 MG/5ML IJ SOLN
INTRAMUSCULAR | Status: AC
  Filled 2019-04-14: qty 10

## 2019-04-14 MED ORDER — LIDOCAINE VISCOUS HCL 2 % MT SOLN
OROMUCOSAL | Status: DC | PRN
  Administered 2019-04-14: 1 via OROMUCOSAL

## 2019-04-14 MED ORDER — LIDOCAINE VISCOUS HCL 2 % MT SOLN
OROMUCOSAL | Status: AC
  Filled 2019-04-14: qty 15

## 2019-04-14 NOTE — Op Note (Signed)
[facility] Patient Name: [name] [name] Procedure Date: 04/14/2019 3:28 PM MRN: [id number] Date of Birth: 1961-02-21 Attending MD: Hildred Laser , MD CSN: [id number] Age: 58 Admit Type: Outpatient Procedure:                Upper GI endoscopy Indications:              Esophageal dysphagia, Follow-up of                            gastro-esophageal reflux disease Providers:                Hildred Laser, MD, Charlsie Quest. Theda Sers RN, RN,                            Raphael Gibney, Technician Referring MD:             Mitzie Na. Quillian Quince, MD Medicines:                Lidocaine spray, Meperidine 50 mg IV, Midazolam 8                            mg IV Complications:            No immediate complications. Estimated Blood Loss:     Estimated blood loss was minimal. Procedure:                Pre-Anesthesia Assessment:                           - Prior to the procedure, a History and Physical                            was performed, and patient medications and                            allergies were reviewed. The patient's tolerance of                            previous anesthesia was also reviewed. The risks                            and benefits of the procedure and the sedation                            options and risks were discussed with the patient.                            All questions were answered, and informed consent                            was obtained. Prior Anticoagulants: The patient has                            taken no previous anticoagulant or antiplatelet  agents except for aspirin. ASA Grade Assessment: II                            - A patient with mild systemic disease. After                            reviewing the risks and benefits, the patient was                            deemed in satisfactory condition to undergo the                            procedure.                           After obtaining informed consent, the  endoscope was                            passed under direct vision. Throughout the                            procedure, the patient's blood pressure, pulse, and                            oxygen saturations were monitored continuously. The                            [id number]) was introduced through the                            mouth, and advanced to the second part of duodenum.                            The upper GI endoscopy was accomplished without                            difficulty. The patient tolerated the procedure                            well. Scope In: 3:59:36 PM Scope Out: 4:17:01 PM Total Procedure Duration: 0 hours 17 minutes 25 seconds  Findings:      LA Grade A (one or more mucosal breaks less than 5 mm, not extending       between tops of 2 mucosal folds) esophagitis with no bleeding was found       at GEJ (36 cm from the incisors).      A 2 cm hiatal hernia was present.      No endoscopic abnormality was evident in the esophagus to explain the       patient's complaint of dysphagia. It was decided, however, to proceed       with dilation of the entire esophagus. The scope was withdrawn. Dilation       was performed with a Maloney dilator with no resistance at 47 Fr. 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      Localized mild inflammation characterized by congestion (edema) and       erosions was found in the gastric antrum. Biopsies were taken with a       cold forceps for histology. Biopsies were taken with a cold forceps for       histology.      The exam of the stomach was otherwise normal.      The duodenal bulb and second portion of the duodenum were normal. Impression:               - LA Grade A reflux esophagitis with no bleeding.                           - 2 cm hiatal hernia.                           - No  endoscopic esophageal abnormality to explain                            patient's dysphagia. Esophagus dilated. Dilated.                           - Gastritis. Biopsied.                           - Normal duodenal bulb and second portion of the                            duodenum.                           Comment: mucosal disruption at proximal esophagus                            post dilation secondary to esophageal web. Moderate Sedation:      Moderate (conscious) sedation was administered by the endoscopy nurse       and supervised by the endoscopist. The following parameters were       monitored: oxygen saturation, heart rate, blood pressure, CO2       capnography and response to care. Total physician intraservice time was       24 minutes. Recommendation:           - Resume previous diet today.                           - Continue present medications including                            Pantoprazole at 40 mg po qam.                           - No aspirin, ibuprofen, naproxen, or other                            non-steroidal  anti-inflammatory drugs for 3 days.                           - Await pathology results. Procedure Code(s):        --- Professional ---                           [phone], Esophagogastroduodenoscopy, flexible,                            transoral; with biopsy, single or multiple                           43450, Dilation of esophagus, by unguided sound or                            bougie, single or multiple passes                           99153, Moderate sedation; each additional 15                            minutes intraservice time                           G0500, Moderate sedation services provided by the                            same physician or other qualified health care                            professional performing a gastrointestinal                            endoscopic service that sedation supports,                            requiring the  presence of an independent trained                            observer to assist in the monitoring of the                            patient's level of consciousness and physiological                            status; initial 15 minutes of intra-service time;                            patient age 71 years or older (additional time may                            be reported with [phone], as appropriate) Diagnosis Code(s):        --- Professional ---  K21.00, Gastro-esophageal reflux disease with                            esophagitis, without bleeding                           K44.9, Diaphragmatic hernia without obstruction or                            gangrene                           K29.70, Gastritis, unspecified, without bleeding                           R13.14, Dysphagia, pharyngoesophageal phase CPT copyright 2019 American [employer] Association. All rights reserved. The codes documented in this report are preliminary and upon coder review may  be revised to meet current compliance requirements. Hildred Laser, MD Hildred Laser, MD 04/14/2019 4:36:38 PM This report has been signed electronically. Number of Addenda: 0

## 2019-04-14 NOTE — Discharge Instructions (Signed)
Resume aspirin on 04/17/2019. Resume the medications as before. Begin pantoprazole 40 mg by mouth 30 minutes before breakfast daily. Resume usual diet. No driving for 24 hours. Physician will call with biopsy results. Surgical consultation for cholecystectomy to be arranged.      Upper Endoscopy, Adult, Care After This sheet gives you information about how to care for yourself after your procedure. Your health care provider may also give you more specific instructions. If you have problems or questions, contact your health care provider. What can I expect after the procedure? After the procedure, it is common to have:  A sore throat.  Mild stomach pain or discomfort.  Bloating.  Nausea. Follow these instructions at home:   Follow instructions from your health care provider about what to eat or drink after your procedure.  Return to your normal activities as told by your health care provider. Ask your health care provider what activities are safe for you.  Take over-the-counter and prescription medicines only as told by your health care provider.  Do not drive for 24 hours if you were given a sedative during your procedure.  Keep all follow-up visits as told by your health care provider. This is important. Contact a health care provider if you have:  A sore throat that lasts longer than one day.  Trouble swallowing. Get help right away if:  You vomit blood or your vomit looks like coffee grounds.  You have: ? A fever. ? Bloody, black, or tarry stools. ? A severe sore throat or you cannot swallow. ? Difficulty breathing. ? Severe pain in your chest or abdomen. Summary  After the procedure, it is common to have a sore throat, mild stomach discomfort, bloating, and nausea.  Do not drive for 24 hours if you were given a sedative during the procedure.  Follow instructions from your health care provider about what to eat or drink after your procedure.  Return to  your normal activities as told by your health care provider. This information is not intended to replace advice given to you by your health care provider. Make sure you discuss any questions you have with your health care provider. Document Released: 10/15/2011 Document Revised: 10/07/2017 Document Reviewed: 09/15/2017 Elsevier Patient Education  2020 [employer].     Hiatal Hernia  A hiatal hernia occurs when part of the stomach slides above the muscle that separates the abdomen from the chest (diaphragm). A person can be born with a hiatal hernia (congenital), or it may develop over time. In almost all cases of hiatal hernia, only the top part of the stomach pushes through the diaphragm. Many people have a hiatal hernia with no symptoms. The larger the hernia, the more likely it is that you will have symptoms. In some cases, a hiatal hernia allows stomach acid to flow back into the tube that carries food from your mouth to your stomach (esophagus). This may cause heartburn symptoms. Severe heartburn symptoms may mean that you have developed a condition called gastroesophageal reflux disease (GERD). What are the causes? This condition is caused by a weakness in the opening (hiatus) where the esophagus passes through the diaphragm to attach to the upper part of the stomach. A person may be born with a weakness in the hiatus, or a weakness can develop over time. What increases the risk? This condition is more likely to develop in:  Older people. Age is a major risk factor for a hiatal hernia, especially if you are over the age  of 88.  Pregnant women.  People who are overweight.  People who have frequent constipation. What are the signs or symptoms? Symptoms of this condition usually develop in the form of GERD symptoms. Symptoms include:  Heartburn.  Belching.  Indigestion.  Trouble swallowing.  Coughing or wheezing.  Sore throat.  Hoarseness.  Chest pain.  Nausea and  vomiting. How is this diagnosed? This condition may be diagnosed during testing for GERD. Tests that may be done include:  X-rays of your stomach or chest.  An upper gastrointestinal (GI) series. This is an X-ray exam of your GI tract that is taken after you swallow a chalky liquid that shows up clearly on the X-ray.  Endoscopy. This is a procedure to look into your stomach using a thin, flexible tube that has a tiny camera and light on the end of it. How is this treated? This condition may be treated by:  Dietary and lifestyle changes to help reduce GERD symptoms.  Medicines. These may include: ? Over-the-counter antacids. ? Medicines that make your stomach empty more quickly. ? Medicines that block the production of stomach acid (H2 blockers). ? Stronger medicines to reduce stomach acid (proton pump inhibitors).  Surgery to repair the hernia, if other treatments are not helping. If you have no symptoms, you may not need treatment. Follow these instructions at home: Lifestyle and activity  Do not use any products that contain nicotine or tobacco, such as cigarettes and e-cigarettes. If you need help quitting, ask your health care provider.  Try to achieve and maintain a healthy body weight.  Avoid putting pressure on your abdomen. Anything that puts pressure on your abdomen increases the amount of acid that may be pushed up into your esophagus. ? Avoid bending over, especially after eating. ? Raise the head of your bed by putting blocks under the legs. This keeps your head and esophagus higher than your stomach. ? Do not wear tight clothing around your chest or stomach. ? Try not to strain when having a bowel movement, when urinating, or when lifting heavy objects. Eating and drinking  Avoid foods that can worsen GERD symptoms. These may include: ? Fatty foods, like fried foods. ? Citrus fruits, like oranges or lemon. ? Other foods and drinks that contain acid, like orange  juice or tomatoes. ? Spicy food. ? Chocolate.  Eat frequent small meals instead of three large meals a day. This helps prevent your stomach from getting too full. ? Eat slowly. ? Do not lie down right after eating. ? Do not eat 1-2 hours before bed.  Do not drink beverages with caffeine. These include cola, coffee, cocoa, and tea.  Do not drink alcohol. General instructions  Take over-the-counter and prescription medicines only as told by your health care provider.  Keep all follow-up visits as told by your health care provider. This is important. Contact a health care provider if:  Your symptoms are not controlled with medicines or lifestyle changes.  You are having trouble swallowing.  You have coughing or wheezing that will not go away. Get help right away if:  Your pain is getting worse.  Your pain spreads to your arms, neck, jaw, teeth, or back.  You have shortness of breath.  You sweat for no reason.  You feel sick to your stomach (nauseous) or you vomit.  You vomit blood.  You have bright red blood in your stools.  You have black, tarry stools. This information is not intended to replace advice given  to you by your health care provider. Make sure you discuss any questions you have with your health care provider. Document Released: 07/06/2003 Document Revised: 03/28/2017 Document Reviewed: 11/18/2016 Elsevier Patient Education  2020 [employer].

## 2019-04-14 NOTE — H&P (Signed)
[name] [name] is an 58 y.o. female.   Chief Complaint: Patient is here for esophagogastroduodenoscopy and possible esophageal dilation HPI: Patient is a 58 year old female who has symptoms of GERD for at least 10 years who presents with intermittent dysphagia to solids.  She also complains of discomfort and fullness in lower retrosternal area.  At times she has noted intrascapular pain.  She has been on pantoprazole for few years.  She is presently not taking PPI because Dr. Quillian Quince wanted to wait for results of EGD.  She also has sporadic right upper quadrant abdominal pain.  She had ultrasound which reveals multiple gallbladder polyps.  The largest measures 8.8 mm.  Her son also revealed echogenic/fatty liver.  She denies melena or hematemesis.  Past Medical History:  Diagnosis Date  . 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 Chest pain    denied on 06/18/2012  . Chronic kidney disease    L - upper pole- defect, doesn't effect     . Family history of anesthesia complication    brother & neice "flat lined" during induction: neice d/t a medication given for a blood clotting problem; brother d/t "miscalculated amount of anesthesia"  . Fibromyalgia   . GERD (gastroesophageal reflux disease)    h/o colitis   . H. pylori infection    2012- stomach biopsy  . H/O hiatal hernia   . Headache(784.0)    using tylenol prn  . Hyperlipidemia   . Hypertension   . Hypoglycemia   . Normal cardiac stress test    pt. released fr. Dr. [name] care in 10/2011, all findings w/i normal limits  . Overweight(278.02)    Obesity  . Palpitations   . Sinusitis, acute    seen by PCP- 06/17/2012, will treat /w augmentin & tussin/DM  . Uterine fibroid    pt. states that she has a "closed cervix"     Past Surgical History:  Procedure Laterality Date  . COLONOSCOPY    . COLONOSCOPY N/A 11/26/2018   Procedure: COLONOSCOPY;  Surgeon: Rogene Houston, MD;  Location: [facility];  Service: Endoscopy;  Laterality: N/A;   1:00-12:00pm  . CRANIOTOMY Right 06/24/2012   Procedure: CRANIOTOMY INTRACRANIAL ANEURYSM FOR CAROTID;  Surgeon: [name], MD;  Location: [facility];  Service: Neurosurgery;  Laterality: Right;  RIGHT Pterional Craniotomy for aneurysm  . LIPOMA EXCISION     buttock 10/2010    Family History  Problem Relation Age of Onset  . Hypertension Other   . Coronary artery disease Other   . Dementia Mother   . Dementia Maternal Aunt    Social History:  reports that she quit smoking about 14 years ago. Her smoking use included cigarettes. She started smoking about 30 years ago. She has a 12.00 pack-year smoking history. She has never used smokeless tobacco. She reports that she does not drink alcohol or use drugs.  Allergies:  Allergies  Allergen Reactions  . Ace Inhibitors Swelling  . Azithromycin Anaphylaxis, Shortness Of Breath and Swelling  . Ciprofloxacin Nausea And Vomiting    Acute kidney failure    . Nsaids     Kidney disease  . Other Swelling, Other (See Comments) and Cough    Sneezing allergy to cats/grass/dust  . Statins Other (See Comments)    Cramping, muscle pain. Tolerates Crestor   . Medrol [Methylprednisolone] Palpitations    Heart racing, increased BP  . Tetracyclines & Related Palpitations    Heart racing, increased bp  Medications Prior to Admission  Medication Sig Dispense Refill  . acetaminophen (TYLENOL) 650 MG CR tablet Take 650 mg by mouth 2 (two) times daily as needed for pain.    [name] aspirin EC 81 MG tablet Take 81 mg by mouth daily.     . chlorthalidone (HYGROTON) 25 MG tablet Take 25 mg by mouth daily.    . Cholecalciferol (VITAMIN D) 50 MCG (2000 UT) CAPS Take 2,000 Units by mouth daily.    [name] diltiazem (CARDIZEM CD) 120 MG 24 hr capsule Take 120 mg by mouth at bedtime.     [name] KLOR-CON M20 20 MEQ tablet Take 20 mEq by mouth at bedtime.     [name] loratadine (CLARITIN) 10 MG tablet Take 10 mg by mouth daily.     . Menthol-Methyl Salicylate (SALONPAS PAIN  RELIEF PATCH EX) Apply 2-3 patches topically daily as needed (back / shoulder pain).    . metoprolol tartrate (LOPRESSOR) 50 MG tablet Take 1 tablet (50 mg total) by mouth 2 (two) times daily. 180 tablet 3  . pantoprazole (PROTONIX) 40 MG tablet Take 1 tablet (40 mg total) by mouth daily. 90 tablet 1  . rosuvastatin (CRESTOR) 10 MG tablet Take 10 mg by mouth every other day.     . albuterol (PROAIR HFA) 108 (90 Base) MCG/ACT inhaler Inhale 1-2 puffs into the lungs every 4 (four) hours as needed for wheezing or shortness of breath.     . dicyclomine (BENTYL) 10 MG capsule TAKE 1 CAPSULE (10 MG TOTAL) BY MOUTH 2 (TWO) TIMES DAILY BEFORE A MEAL. 180 capsule 1    No results found for this or any previous visit (from the past 48 hour(s)). No results found.  Review of Systems  Blood pressure 132/71, pulse 65, temperature 98.9 F (37.2 C), temperature source Oral, resp. rate 15, height 5\' 3"  (1.6 m), weight 89.8 kg, last menstrual period 08/25/2012, SpO2 100 %. Physical Exam  Constitutional: She appears well-developed and well-nourished.  HENT:  Mouth/Throat: Oropharynx is clear and moist.  Eyes: Conjunctivae are normal. No scleral icterus.  Neck: No thyromegaly present.  Cardiovascular: Normal rate, regular rhythm and normal heart sounds.  No murmur heard. Respiratory: Effort normal and breath sounds normal.  GI: Soft. She exhibits no distension and no mass. There is no abdominal tenderness.  Musculoskeletal:        General: No edema.  Lymphadenopathy:    She has no cervical adenopathy.  Neurological: She is alert.  Skin: Skin is warm and dry.     Assessment/Plan  Esophageal dysphagia in a patient with chronic GERD. Esophagogastroduodenoscopy with esophageal dilation.  Hildred Laser, MD 04/14/2019, 3:46 PM

## 2019-04-14 NOTE — Progress Notes (Signed)
[name] [name] cannot drive, operate machinery or sign legal documents until after 5pm on Thursday December 17,2020.  She can return to work on Friday April 16, 2019.

## 2019-04-16 ENCOUNTER — Other Ambulatory Visit: Payer: Self-pay

## 2019-04-16 LAB — SURGICAL PATHOLOGY

## 2019-04-20 ENCOUNTER — Other Ambulatory Visit: Payer: Self-pay

## 2019-04-20 ENCOUNTER — Ambulatory Visit: Payer: BC Managed Care – PPO | Admitting: General Surgery

## 2019-04-20 ENCOUNTER — Encounter: Payer: Self-pay | Admitting: General Surgery

## 2019-04-20 VITALS — BP 128/78 | HR 59 | Temp 98.4°F | Resp 16 | Ht 63.0 in | Wt 202.0 lb

## 2019-04-20 DIAGNOSIS — K824 Cholesterolosis of gallbladder: Secondary | ICD-10-CM | POA: Diagnosis not present

## 2019-04-20 NOTE — Patient Instructions (Signed)
Laparoscopic Cholecystectomy Laparoscopic cholecystectomy is surgery to remove the gallbladder. The gallbladder is a pear-shaped organ that lies beneath the liver on the right side of the body. The gallbladder stores bile, which is a fluid that helps the body to digest fats. Cholecystectomy is often done for inflammation of the gallbladder (cholecystitis). This condition is usually caused by a buildup of gallstones (cholelithiasis) in the gallbladder. Gallstones can block the flow of bile, which can result in inflammation and pain. In severe cases, emergency surgery may be required. This procedure is done though small incisions in your abdomen (laparoscopic surgery). A thin scope with a camera (laparoscope) is inserted through one incision. Thin surgical instruments are inserted through the other incisions. In some cases, a laparoscopic procedure may be turned into a type of surgery that is done through a larger incision (open surgery). Tell a health care provider about:  Any allergies you have.  All medicines you are taking, including vitamins, herbs, eye drops, creams, and over-the-counter medicines.  Any problems you or family members have had with anesthetic medicines.  Any blood disorders you have.  Any surgeries you have had.  Any medical conditions you have.  Whether you are pregnant or may be pregnant. What are the risks? Generally, this is a safe procedure. However, problems may occur, including:  Infection.  Bleeding.  Allergic reactions to medicines.  Damage to other structures or organs.  A stone remaining in the common bile duct. The common bile duct carries bile from the gallbladder into the small intestine.  A bile leak from the cyst duct that is clipped when your gallbladder is removed. What happens before the procedure?   Medicines  Ask your health care provider about: ? Changing or stopping your regular medicines. This is especially important if you are taking  diabetes medicines or blood thinners. ? Taking medicines such as aspirin and ibuprofen. These medicines can thin your blood. Do not take these medicines before your procedure if your health care provider instructs you not to.  You may be given antibiotic medicine to help prevent infection. General instructions  Let your health care provider know if you develop a cold or an infection before surgery.  Plan to have someone take you home from the hospital or clinic.  Ask your health care provider how your surgical site will be marked or identified. What happens during the procedure?   To reduce your risk of infection: ? Your health care team will wash or sanitize their hands. ? Your skin will be washed with soap. ? Hair may be removed from the surgical area.  An IV tube may be inserted into one of your veins.  You will be given one or more of the following: ? A medicine to help you relax (sedative). ? A medicine to make you fall asleep (general anesthetic).  A breathing tube will be placed in your mouth.  Your surgeon will make several small cuts (incisions) in your abdomen.  The laparoscope will be inserted through one of the small incisions. The camera on the laparoscope will send images to a TV screen (monitor) in the operating room. This lets your surgeon see inside your abdomen.  Air-like gas will be pumped into your abdomen. This will expand your abdomen to give the surgeon more room to perform the surgery.  Other tools that are needed for the procedure will be inserted through the other incisions. The gallbladder will be removed through one of the incisions.  Your common bile duct   may be examined. If stones are found in the common bile duct, they may be removed.  After your gallbladder has been removed, the incisions will be closed with stitches (sutures), staples, or skin glue.  Your incisions may be covered with a bandage (dressing). The procedure may vary among health  care providers and hospitals. What happens after the procedure?  Your blood pressure, heart rate, breathing rate, and blood oxygen level will be monitored until the medicines you were given have worn off.  You will be given medicines as needed to control your pain.  Do not drive for 24 hours if you were given a sedative. This information is not intended to replace advice given to you by your health care provider. Make sure you discuss any questions you have with your health care provider. Document Released: 04/15/2005 Document Revised: 03/28/2017 Document Reviewed: 10/02/2015 Elsevier Patient Education  2020 [employer]  

## 2019-04-21 NOTE — H&P (Signed)
[name] [name] [id number]; October 07, 1960   HPI Patient is a 58 year old black female who was referred to my care by Dr. Laural Golden for evaluation treatment of epigastric pain and gallbladder polyps.  Patient has had intermittent episodes of epigastric pain and fatty food intolerance for the past few months.  She had an ultrasound done which revealed gallbladder polyps.  She recently had an EGD and had her esophagus stretched.  She denies any fever, chills, or jaundice.  Her symptoms seem to be more frequent in nature.  She currently has 0 out of 10 abdominal pain.  Ultrasound the gallbladder revealed multiple gallbladder polyps with a normal common bile duct. Past Medical History:  Diagnosis Date  . 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 Chest pain    denied on 06/18/2012  . Chronic kidney disease    L - upper pole- defect, doesn't effect     . Family history of anesthesia complication    brother & neice "flat lined" during induction: neice d/t a medication given for a blood clotting problem; brother d/t "miscalculated amount of anesthesia"  . Fibromyalgia   . GERD (gastroesophageal reflux disease)    h/o colitis   . H. pylori infection    2012- stomach biopsy  . H/O hiatal hernia   . Headache(784.0)    using tylenol prn  . Hyperlipidemia   . Hypertension   . Hypoglycemia   . Normal cardiac stress test    pt. released fr. Dr. [name] care in 10/2011, all findings w/i normal limits  . Overweight(278.02)    Obesity  . Palpitations   . Sinusitis, acute    seen by PCP- 06/17/2012, will treat /w augmentin & tussin/DM  . Uterine fibroid    pt. states that she has a "closed cervix"     Past Surgical History:  Procedure Laterality Date  . BIOPSY  04/14/2019   Procedure: BIOPSY;  Surgeon: Rogene Houston, MD;  [facility];  Service: Endoscopy;;  antrum  . COLONOSCOPY    . COLONOSCOPY N/A 11/26/2018   Procedure: COLONOSCOPY;  Surgeon: Rogene Houston, MD;  [facility];   Service: Endoscopy;  Laterality: N/A;  1:00-12:00pm  . CRANIOTOMY Right 06/24/2012   Procedure: CRANIOTOMY INTRACRANIAL ANEURYSM FOR CAROTID;  Surgeon: [name], MD;  Location: [place] [facility];  Service: Neurosurgery;  Laterality: Right;  RIGHT Pterional Craniotomy for aneurysm  . ESOPHAGOGASTRODUODENOSCOPY N/A 04/14/2019   Procedure: ESOPHAGOGASTRODUODENOSCOPY (EGD) WITH POSSIBLE ESOPHAGEAL DILATION;  Surgeon: Rogene Houston, MD;  [facility];  Service: Endoscopy;  Laterality: N/A;  300pm  . LIPOMA EXCISION     buttock 10/2010    Family History  Problem Relation Age of Onset  . Hypertension Other   . Coronary artery disease Other   . Dementia Mother   . Dementia Maternal Aunt     Current Outpatient Medications on File Prior to Visit  Medication Sig Dispense Refill  . acetaminophen (TYLENOL) 650 MG CR tablet Take 650 mg by mouth 2 (two) times daily as needed for pain.    [name] albuterol (PROAIR HFA) 108 (90 Base) MCG/ACT inhaler Inhale 1-2 puffs into the lungs every 4 (four) hours as needed for wheezing or shortness of breath.     [name] aspirin EC 81 MG tablet Take 1 tablet (81 mg total) by mouth daily.    . chlorthalidone (HYGROTON) 25 MG tablet Take 25 mg by mouth daily.    . Cholecalciferol (VITAMIN D) 50 MCG (2000  UT) CAPS Take 2,000 Units by mouth daily.    [name] diltiazem (CARDIZEM CD) 120 MG 24 hr capsule Take 120 mg by mouth at bedtime.     [name] KLOR-CON M20 20 MEQ tablet Take 20 mEq by mouth at bedtime.     [name] loratadine (CLARITIN) 10 MG tablet Take 10 mg by mouth daily.     . Menthol-Methyl Salicylate (SALONPAS PAIN RELIEF PATCH EX) Apply 2-3 patches topically daily as needed (back / shoulder pain).    . pantoprazole (PROTONIX) 40 MG tablet Take 1 tablet (40 mg total) by mouth daily. 90 tablet 1  . rosuvastatin (CRESTOR) 10 MG tablet Take 10 mg by mouth every other day.     . dicyclomine (BENTYL) 10 MG capsule TAKE 1 CAPSULE (10 MG TOTAL) BY MOUTH 2 (TWO) TIMES DAILY BEFORE A  MEAL. (Patient not taking: Reported on 04/20/2019) 180 capsule 1  . metoprolol tartrate (LOPRESSOR) 50 MG tablet Take 1 tablet (50 mg total) by mouth 2 (two) times daily. 180 tablet 3   No current facility-administered medications on file prior to visit.    Allergies  Allergen Reactions  . Ace Inhibitors Swelling  . Azithromycin Anaphylaxis, Shortness Of Breath and Swelling  . Ciprofloxacin Nausea And Vomiting    Acute kidney failure    . Nsaids     Kidney disease  . Other Swelling, Other (See Comments) and Cough    Sneezing allergy to cats/grass/dust  . Statins Other (See Comments)    Cramping, muscle pain. Tolerates Crestor   . Medrol [Methylprednisolone] Palpitations    Heart racing, increased BP  . Tetracyclines & Related Palpitations    Heart racing, increased bp    Social History   Substance and Sexual Activity  Alcohol Use No  . Alcohol/week: 0.0 standard drinks    Social History   Tobacco Use  Smoking Status Former Smoker  . Packs/day: 0.80  . Years: 15.00  . Pack years: 12.00  . Types: Cigarettes  . Start date: 02/13/1989  . Quit date: 04/28/2004  . Years since quitting: 14.9  Smokeless Tobacco Never Used    Review of Systems  Constitutional: Negative.   HENT: Positive for sinus pain.   Eyes: Negative.   Respiratory: Negative.   Cardiovascular: Negative.   Gastrointestinal: Positive for abdominal pain.  Genitourinary: Negative.   Musculoskeletal: Positive for neck pain.  Skin: Negative.   Neurological: Negative.   Endo/Heme/Allergies: Negative.   Psychiatric/Behavioral: Negative.     Objective   Vitals:   04/20/19 1514  BP: 128/78  Pulse: (!) 59  Resp: 16  Temp: 98.4 F (36.9 C)  SpO2: 95%    Physical Exam Vitals reviewed.  Constitutional:      Appearance: She is well-developed. She is not ill-appearing.  HENT:     Head: Normocephalic and atraumatic.  Cardiovascular:     Rate and Rhythm: Normal rate and regular rhythm.      Heart sounds: Normal heart sounds. No murmur. No friction rub. No gallop.   Pulmonary:     Effort: Pulmonary effort is normal. No respiratory distress.     Breath sounds: Normal breath sounds. No stridor. No wheezing, rhonchi or rales.  Abdominal:     General: Abdomen is protuberant. Bowel sounds are normal. There is no distension.     Palpations: Abdomen is soft.     Tenderness: There is no abdominal tenderness. Negative signs include Murphy's sign.     Hernia: No hernia is present.  Skin:  General: Skin is warm and dry.  Neurological:     Mental Status: She is alert and oriented to person, place, and time.   Dr. Olevia Perches notes reviewed  Assessment  Biliary colic, gallbladder polyps Plan   Patient is scheduled for laparoscopic cholecystectomy on 05/05/2019.  The risks and benefits of the procedure including bleeding, infection, hepatobiliary injury, and the possibility of an open procedure were fully explained to the patient, who gave informed consent.

## 2019-04-21 NOTE — Progress Notes (Signed)
[name] [name] [id number]; [birth year]   HPI Patient is a 58 year old black female who was referred to my care by Dr. Laural Golden for evaluation treatment of epigastric pain and gallbladder polyps.  Patient has had intermittent episodes of epigastric pain and fatty food intolerance for the past few months.  She had an ultrasound done which revealed gallbladder polyps.  She recently had an EGD and had her esophagus stretched.  She denies any fever, chills, or jaundice.  Her symptoms seem to be more frequent in nature.  She currently has 0 out of 10 abdominal pain.  Ultrasound the gallbladder revealed multiple gallbladder polyps with a normal common bile duct. Past Medical History:  Diagnosis Date  . 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 Chest pain    denied on 06/18/2012  . Chronic kidney disease    L - upper pole- defect, doesn't effect     . Family history of anesthesia complication    brother & neice "flat lined" during induction: neice d/t a medication given for a blood clotting problem; brother d/t "miscalculated amount of anesthesia"  . Fibromyalgia   . GERD (gastroesophageal reflux disease)    h/o colitis   . H. pylori infection    2012- stomach biopsy  . H/O hiatal hernia   . Headache(784.0)    using tylenol prn  . Hyperlipidemia   . Hypertension   . Hypoglycemia   . Normal cardiac stress test    pt. released fr. Dr. [name] care in 10/2011, all findings w/i normal limits  . Overweight(278.02)    Obesity  . Palpitations   . Sinusitis, acute    seen by PCP- 06/17/2012, will treat /w augmentin & tussin/DM  . Uterine fibroid    pt. states that she has a "closed cervix"     Past Surgical History:  Procedure Laterality Date  . BIOPSY  04/14/2019   Procedure: BIOPSY;  Surgeon: Rogene Houston, MD;  [facility];  Service: Endoscopy;;  antrum  . COLONOSCOPY    . COLONOSCOPY N/A 11/26/2018   Procedure: COLONOSCOPY;  Surgeon: Rogene Houston, MD;  [facility];   Service: Endoscopy;  Laterality: N/A;  1:00-12:00pm  . CRANIOTOMY Right 06/24/2012   Procedure: CRANIOTOMY INTRACRANIAL ANEURYSM FOR CAROTID;  Surgeon: [name], MD;  Location: [place] [facility];  Service: Neurosurgery;  Laterality: Right;  RIGHT Pterional Craniotomy for aneurysm  . ESOPHAGOGASTRODUODENOSCOPY N/A 04/14/2019   Procedure: ESOPHAGOGASTRODUODENOSCOPY (EGD) WITH POSSIBLE ESOPHAGEAL DILATION;  Surgeon: Rogene Houston, MD;  [facility];  Service: Endoscopy;  Laterality: N/A;  300pm  . LIPOMA EXCISION     buttock 10/2010    Family History  Problem Relation Age of Onset  . Hypertension Other   . Coronary artery disease Other   . Dementia Mother   . Dementia Maternal Aunt     Current Outpatient Medications on File Prior to Visit  Medication Sig Dispense Refill  . acetaminophen (TYLENOL) 650 MG CR tablet Take 650 mg by mouth 2 (two) times daily as needed for pain.    [name] albuterol (PROAIR HFA) 108 (90 Base) MCG/ACT inhaler Inhale 1-2 puffs into the lungs every 4 (four) hours as needed for wheezing or shortness of breath.     [name] aspirin EC 81 MG tablet Take 1 tablet (81 mg total) by mouth daily.    . chlorthalidone (HYGROTON) 25 MG tablet Take 25 mg by mouth daily.    . Cholecalciferol (VITAMIN D) 50 MCG (2000  UT) CAPS Take 2,000 Units by mouth daily.    [name] diltiazem (CARDIZEM CD) 120 MG 24 hr capsule Take 120 mg by mouth at bedtime.     [name] KLOR-CON M20 20 MEQ tablet Take 20 mEq by mouth at bedtime.     [name] loratadine (CLARITIN) 10 MG tablet Take 10 mg by mouth daily.     . Menthol-Methyl Salicylate (SALONPAS PAIN RELIEF PATCH EX) Apply 2-3 patches topically daily as needed (back / shoulder pain).    . pantoprazole (PROTONIX) 40 MG tablet Take 1 tablet (40 mg total) by mouth daily. 90 tablet 1  . rosuvastatin (CRESTOR) 10 MG tablet Take 10 mg by mouth every other day.     . dicyclomine (BENTYL) 10 MG capsule TAKE 1 CAPSULE (10 MG TOTAL) BY MOUTH 2 (TWO) TIMES DAILY BEFORE A  MEAL. (Patient not taking: Reported on 04/20/2019) 180 capsule 1  . metoprolol tartrate (LOPRESSOR) 50 MG tablet Take 1 tablet (50 mg total) by mouth 2 (two) times daily. 180 tablet 3   No current facility-administered medications on file prior to visit.    Allergies  Allergen Reactions  . Ace Inhibitors Swelling  . Azithromycin Anaphylaxis, Shortness Of Breath and Swelling  . Ciprofloxacin Nausea And Vomiting    Acute kidney failure    . Nsaids     Kidney disease  . Other Swelling, Other (See Comments) and Cough    Sneezing allergy to cats/grass/dust  . Statins Other (See Comments)    Cramping, muscle pain. Tolerates Crestor   . Medrol [Methylprednisolone] Palpitations    Heart racing, increased BP  . Tetracyclines & Related Palpitations    Heart racing, increased bp    Social History   Substance and Sexual Activity  Alcohol Use No  . Alcohol/week: 0.0 standard drinks    Social History   Tobacco Use  Smoking Status Former Smoker  . Packs/day: 0.80  . Years: 15.00  . Pack years: 12.00  . Types: Cigarettes  . Start date: 02/13/1989  . Quit date: 04/28/2004  . Years since quitting: 14.9  Smokeless Tobacco Never Used    Review of Systems  Constitutional: Negative.   HENT: Positive for sinus pain.   Eyes: Negative.   Respiratory: Negative.   Cardiovascular: Negative.   Gastrointestinal: Positive for abdominal pain.  Genitourinary: Negative.   Musculoskeletal: Positive for neck pain.  Skin: Negative.   Neurological: Negative.   Endo/Heme/Allergies: Negative.   Psychiatric/Behavioral: Negative.     Objective   Vitals:   04/20/19 1514  BP: 128/78  Pulse: (!) 59  Resp: 16  Temp: 98.4 F (36.9 C)  SpO2: 95%    Physical Exam Vitals reviewed.  Constitutional:      Appearance: She is well-developed. She is not ill-appearing.  HENT:     Head: Normocephalic and atraumatic.  Cardiovascular:     Rate and Rhythm: Normal rate and regular rhythm.      Heart sounds: Normal heart sounds. No murmur. No friction rub. No gallop.   Pulmonary:     Effort: Pulmonary effort is normal. No respiratory distress.     Breath sounds: Normal breath sounds. No stridor. No wheezing, rhonchi or rales.  Abdominal:     General: Abdomen is protuberant. Bowel sounds are normal. There is no distension.     Palpations: Abdomen is soft.     Tenderness: There is no abdominal tenderness. Negative signs include Murphy's sign.     Hernia: No hernia is present.  Skin:  General: Skin is warm and dry.  Neurological:     Mental Status: She is alert and oriented to person, place, and time.   Dr. Olevia Perches notes reviewed  Assessment  Biliary colic, gallbladder polyps Plan   Patient is scheduled for laparoscopic cholecystectomy on 05/05/2019.  The risks and benefits of the procedure including bleeding, infection, hepatobiliary injury, and the possibility of an open procedure were fully explained to the patient, who gave informed consent.

## 2019-04-27 ENCOUNTER — Other Ambulatory Visit (HOSPITAL_COMMUNITY)
Admission: RE | Admit: 2019-04-27 | Discharge: 2019-04-27 | Disposition: A | Discharge status: Home / Self Care | Payer: BC Managed Care – PPO | Source: Ambulatory Visit | Attending: General Surgery | Admitting: General Surgery

## 2019-04-27 ENCOUNTER — Encounter (HOSPITAL_COMMUNITY)
Admission: RE | Admit: 2019-04-27 | Discharge: 2019-04-27 | Disposition: A | Discharge status: Home / Self Care | Payer: BC Managed Care – PPO | Source: Ambulatory Visit | Attending: General Surgery | Admitting: General Surgery

## 2019-04-27 ENCOUNTER — Other Ambulatory Visit: Payer: Self-pay

## 2019-04-27 DIAGNOSIS — Z20828 Contact with and (suspected) exposure to other viral communicable diseases: Secondary | ICD-10-CM | POA: Insufficient documentation

## 2019-04-27 DIAGNOSIS — Z01812 Encounter for preprocedural laboratory examination: Secondary | ICD-10-CM | POA: Diagnosis not present

## 2019-04-27 LAB — SARS CORONAVIRUS 2 (TAT 6-24 HRS): SARS Coronavirus 2: NEGATIVE

## 2019-04-28 ENCOUNTER — Ambulatory Visit (HOSPITAL_COMMUNITY)
Admission: RE | Admit: 2019-04-28 | Discharge: 2019-04-28 | Disposition: A | Discharge status: Home / Self Care | Payer: BC Managed Care – PPO | Attending: General Surgery | Admitting: General Surgery

## 2019-04-28 ENCOUNTER — Encounter (HOSPITAL_COMMUNITY): Payer: Self-pay | Admitting: General Surgery

## 2019-04-28 ENCOUNTER — Ambulatory Visit (HOSPITAL_COMMUNITY): Payer: BC Managed Care – PPO | Admitting: Anesthesiology

## 2019-04-28 ENCOUNTER — Encounter (HOSPITAL_COMMUNITY)
Admission: RE | Disposition: A | Discharge status: Home / Self Care | Payer: Self-pay | Source: Home / Self Care | Attending: General Surgery

## 2019-04-28 DIAGNOSIS — E785 Hyperlipidemia, unspecified: Secondary | ICD-10-CM | POA: Insufficient documentation

## 2019-04-28 DIAGNOSIS — K8050 Calculus of bile duct without cholangitis or cholecystitis without obstruction: Secondary | ICD-10-CM

## 2019-04-28 DIAGNOSIS — K219 Gastro-esophageal reflux disease without esophagitis: Secondary | ICD-10-CM | POA: Diagnosis not present

## 2019-04-28 DIAGNOSIS — Z6835 Body mass index (BMI) 35.0-35.9, adult: Secondary | ICD-10-CM | POA: Insufficient documentation

## 2019-04-28 DIAGNOSIS — M797 Fibromyalgia: Secondary | ICD-10-CM | POA: Insufficient documentation

## 2019-04-28 DIAGNOSIS — K824 Cholesterolosis of gallbladder: Secondary | ICD-10-CM | POA: Diagnosis not present

## 2019-04-28 DIAGNOSIS — Z87891 Personal history of nicotine dependence: Secondary | ICD-10-CM | POA: Insufficient documentation

## 2019-04-28 DIAGNOSIS — N189 Chronic kidney disease, unspecified: Secondary | ICD-10-CM | POA: Diagnosis not present

## 2019-04-28 DIAGNOSIS — I10 Essential (primary) hypertension: Secondary | ICD-10-CM | POA: Diagnosis not present

## 2019-04-28 DIAGNOSIS — Z8249 Family history of ischemic heart disease and other diseases of the circulatory system: Secondary | ICD-10-CM | POA: Diagnosis not present

## 2019-04-28 DIAGNOSIS — I129 Hypertensive chronic kidney disease with stage 1 through stage 4 chronic kidney disease, or unspecified chronic kidney disease: Secondary | ICD-10-CM | POA: Insufficient documentation

## 2019-04-28 DIAGNOSIS — E669 Obesity, unspecified: Secondary | ICD-10-CM | POA: Insufficient documentation

## 2019-04-28 DIAGNOSIS — Z7982 Long term (current) use of aspirin: Secondary | ICD-10-CM | POA: Diagnosis not present

## 2019-04-28 DIAGNOSIS — Z79899 Other long term (current) drug therapy: Secondary | ICD-10-CM | POA: Insufficient documentation

## 2019-04-28 HISTORY — PX: CHOLECYSTECTOMY: SHX55

## 2019-04-28 SURGERY — LAPAROSCOPIC CHOLECYSTECTOMY
Anesthesia: General | Site: Abdomen

## 2019-04-28 MED ORDER — GLYCOPYRROLATE PF 0.2 MG/ML IJ SOSY
PREFILLED_SYRINGE | INTRAMUSCULAR | Status: DC | PRN
  Administered 2019-04-28: .2 mg via INTRAVENOUS

## 2019-04-28 MED ORDER — EPHEDRINE SULFATE 50 MG/ML IJ SOLN
INTRAMUSCULAR | Status: DC | PRN
  Administered 2019-04-28: 10 mg via INTRAVENOUS

## 2019-04-28 MED ORDER — BUPIVACAINE LIPOSOME 1.3 % IJ SUSP
INTRAMUSCULAR | Status: DC | PRN
  Administered 2019-04-28: 20 mL

## 2019-04-28 MED ORDER — FENTANYL CITRATE (PF) 100 MCG/2ML IJ SOLN
INTRAMUSCULAR | Status: DC | PRN
  Administered 2019-04-28: 50 ug via INTRAVENOUS
  Administered 2019-04-28: 100 ug via INTRAVENOUS
  Administered 2019-04-28: 50 ug via INTRAVENOUS

## 2019-04-28 MED ORDER — LACTATED RINGERS IV SOLN
Freq: Once | INTRAVENOUS | Status: AC
  Administered 2019-04-28: 1000 mL via INTRAVENOUS

## 2019-04-28 MED ORDER — PROMETHAZINE HCL 25 MG/ML IJ SOLN: 6.2500 mg | INTRAMUSCULAR | Status: DC | PRN

## 2019-04-28 MED ORDER — HYDROMORPHONE HCL 1 MG/ML IJ SOLN: 0.2500 mg | INTRAMUSCULAR | Status: DC | PRN

## 2019-04-28 MED ORDER — LIDOCAINE HCL (CARDIAC) PF 100 MG/5ML IV SOSY
PREFILLED_SYRINGE | INTRAVENOUS | Status: DC | PRN
  Administered 2019-04-28: 60 mg via INTRAVENOUS

## 2019-04-28 MED ORDER — ROCURONIUM BROMIDE 100 MG/10ML IV SOLN
INTRAVENOUS | Status: DC | PRN
  Administered 2019-04-28: 50 mg via INTRAVENOUS

## 2019-04-28 MED ORDER — MEPERIDINE HCL 50 MG/ML IJ SOLN: 6.2500 mg | INTRAMUSCULAR | Status: DC | PRN

## 2019-04-28 MED ORDER — CHLORHEXIDINE GLUCONATE CLOTH 2 % EX PADS: 6.0000 | MEDICATED_PAD | Freq: Once | CUTANEOUS | Status: DC

## 2019-04-28 MED ORDER — PROPOFOL 10 MG/ML IV BOLUS
INTRAVENOUS | Status: DC | PRN
  Administered 2019-04-28: 200 mg via INTRAVENOUS

## 2019-04-28 MED ORDER — CEFAZOLIN SODIUM-DEXTROSE 2-4 GM/100ML-% IV SOLN
2.0000 g | INTRAVENOUS | Status: AC
  Administered 2019-04-28: 2 g via INTRAVENOUS
  Filled 2019-04-28: qty 100

## 2019-04-28 MED ORDER — SODIUM CHLORIDE 0.9 % IR SOLN
Status: DC | PRN
  Administered 2019-04-28: 1000 mL

## 2019-04-28 MED ORDER — LACTATED RINGERS IV SOLN: INTRAVENOUS | Status: DC | PRN

## 2019-04-28 MED ORDER — BUPIVACAINE LIPOSOME 1.3 % IJ SUSP
INTRAMUSCULAR | Status: AC
  Filled 2019-04-28: qty 20

## 2019-04-28 MED ORDER — SUGAMMADEX SODIUM 200 MG/2ML IV SOLN
INTRAVENOUS | Status: DC | PRN
  Administered 2019-04-28: 358.8 mg via INTRAVENOUS

## 2019-04-28 MED ORDER — HEMOSTATIC AGENTS (NO CHARGE) OPTIME
TOPICAL | Status: DC | PRN
  Administered 2019-04-28: 1 via TOPICAL

## 2019-04-28 MED ORDER — ONDANSETRON HCL 4 MG/2ML IJ SOLN
INTRAMUSCULAR | Status: DC | PRN
  Administered 2019-04-28: 4 mg via INTRAVENOUS

## 2019-04-28 MED ORDER — SCOPOLAMINE 1 MG/3DAYS TD PT72
1.0000 | MEDICATED_PATCH | TRANSDERMAL | Status: DC
  Administered 2019-04-28: 1.5 mg via TRANSDERMAL

## 2019-04-28 MED ORDER — HYDROCODONE-ACETAMINOPHEN 5-325 MG PO TABS: 1.0000 | ORAL_TABLET | ORAL | 0 refills | Status: DC | PRN

## 2019-04-28 MED ORDER — SCOPOLAMINE 1 MG/3DAYS TD PT72
MEDICATED_PATCH | TRANSDERMAL | Status: AC
  Filled 2019-04-28: qty 1

## 2019-04-28 SURGICAL SUPPLY — 47 items
ADH SKN CLS APL DERMABOND .7 (GAUZE/BANDAGES/DRESSINGS) ×1
APL PRP STRL LF DISP 70% ISPRP (MISCELLANEOUS) ×1
APPLIER CLIP ROT 10 11.4 M/L (STAPLE) ×2
APR CLP MED LRG 11.4X10 (STAPLE) ×1
BAG RETRIEVAL 10 (BASKET) ×1
CHLORAPREP W/TINT 26 (MISCELLANEOUS) ×2 IMPLANT
CLIP APPLIE ROT 10 11.4 M/L (STAPLE) ×1 IMPLANT
CLOTH BEACON ORANGE TIMEOUT ST (SAFETY) ×2 IMPLANT
COVER LIGHT HANDLE STERIS (MISCELLANEOUS) ×4 IMPLANT
COVER WAND RF STERILE (DRAPES) ×2 IMPLANT
DERMABOND ADVANCED (GAUZE/BANDAGES/DRESSINGS) ×1
DERMABOND ADVANCED .7 DNX12 (GAUZE/BANDAGES/DRESSINGS) ×1 IMPLANT
ELECT REM PT RETURN 9FT ADLT (ELECTROSURGICAL) ×2
ELECTRODE REM PT RTRN 9FT ADLT (ELECTROSURGICAL) ×1 IMPLANT
GLOVE BIO SURGEON STRL SZ 6.5 (GLOVE) ×1 IMPLANT
GLOVE BIOGEL PI IND STRL 6.5 (GLOVE) IMPLANT
GLOVE BIOGEL PI IND STRL 7.0 (GLOVE) ×2 IMPLANT
GLOVE BIOGEL PI INDICATOR 6.5 (GLOVE) ×1
GLOVE BIOGEL PI INDICATOR 7.0 (GLOVE) ×2
GLOVE ECLIPSE 6.5 STRL STRAW (GLOVE) ×1 IMPLANT
GLOVE SURG SS PI 7.5 STRL IVOR (GLOVE) ×2 IMPLANT
GOWN STRL REUS W/TWL LRG LVL3 (GOWN DISPOSABLE) ×6 IMPLANT
HEMOSTAT SNOW SURGICEL 2X4 (HEMOSTASIS) ×2 IMPLANT
INST SET LAPROSCOPIC AP (KITS) ×2 IMPLANT
KIT TURNOVER KIT A (KITS) ×2 IMPLANT
MANIFOLD NEPTUNE II (INSTRUMENTS) ×2 IMPLANT
NDL HYPO 18GX1.5 BLUNT FILL (NEEDLE) ×1 IMPLANT
NDL INSUFFLATION 14GA 120MM (NEEDLE) ×1 IMPLANT
NEEDLE HYPO 18GX1.5 BLUNT FILL (NEEDLE) ×2 IMPLANT
NEEDLE HYPO 22GX1.5 SAFETY (NEEDLE) ×2 IMPLANT
NEEDLE INSUFFLATION 14GA 120MM (NEEDLE) ×2 IMPLANT
NS IRRIG 1000ML POUR BTL (IV SOLUTION) ×2 IMPLANT
PACK LAP CHOLE LZT030E (CUSTOM PROCEDURE TRAY) ×2 IMPLANT
PAD ARMBOARD 7.5X6 YLW CONV (MISCELLANEOUS) ×2 IMPLANT
SET BASIN LINEN APH (SET/KITS/TRAYS/PACK) ×2 IMPLANT
SET TUBE SMOKE EVAC HIGH FLOW (TUBING) ×2 IMPLANT
SLEEVE ENDOPATH XCEL 5M (ENDOMECHANICALS) ×2 IMPLANT
SUT MNCRL AB 4-0 PS2 18 (SUTURE) ×4 IMPLANT
SUT VICRYL 0 UR6 27IN ABS (SUTURE) ×2 IMPLANT
SYR 20ML LL LF (SYRINGE) ×4 IMPLANT
SYS BAG RETRIEVAL 10MM (BASKET) ×1
SYSTEM BAG RETRIEVAL 10MM (BASKET) ×1 IMPLANT
TROCAR ENDO BLADELESS 11MM (ENDOMECHANICALS) ×2 IMPLANT
TROCAR XCEL NON-BLD 5MMX100MML (ENDOMECHANICALS) ×2 IMPLANT
TROCAR XCEL UNIV SLVE 11M 100M (ENDOMECHANICALS) ×2 IMPLANT
TUBE CONNECTING 12X1/4 (SUCTIONS) ×2 IMPLANT
WARMER LAPAROSCOPE (MISCELLANEOUS) ×2 IMPLANT

## 2019-04-28 NOTE — Transfer of Care (Signed)
Immediate Anesthesia Transfer of Care Note  Patient: [name] [name]  Procedure(s) Performed: LAPAROSCOPIC CHOLECYSTECTOMY (N/A Abdomen)  Patient Location: PACU  Anesthesia Type:General  Level of Consciousness: awake, alert , oriented and patient cooperative  Airway & Oxygen Therapy: Patient Spontanous Breathing  Post-op Assessment: Report given to RN and Post -op Vital signs reviewed and stable  Post vital signs: Reviewed and stable  Last Vitals:  Vitals Value Taken Time  BP 117/82 04/28/19 0823  Temp    Pulse 57 04/28/19 0826  Resp 21 04/28/19 0826  SpO2 100 % 04/28/19 0826  Vitals shown include unvalidated device data.  Last Pain:  Vitals:   04/28/19 0657  TempSrc: Oral  PainSc: 0-No pain      Patients Stated Pain Goal: 9 ([id number])  Complications: No apparent anesthesia complications

## 2019-04-28 NOTE — Op Note (Signed)
Patient:  [name] [name]  DOB:  08/06/1960  MRN:  [id number]   Preop Diagnosis: Biliary colic, gallbladder polyps  Postop Diagnosis: Same  Procedure: Laparoscopic cholecystectomy  Surgeon: Aviva Signs, MD  Assistant: Curlene Labrum, MD  Anes: General endotracheal  Indications: Patient is a 58 year old female who presents with biliary colic and also enlarging gallbladder polyps.  The risks and benefits of the procedure including bleeding, infection, hepatobiliary injury, and the possibility of an open procedure were fully explained to the patient, who gave informed consent.  Procedure note: The patient was placed in the supine position.  After induction of general endotracheal anesthesia, the abdomen was prepped and draped using the usual sterile technique with ChloraPrep.  Surgical site confirmation was performed.  A supraumbilical incision was made down to the fascia.  A Veress needle was introduced into the abdominal cavity and confirmation of placement was done using the saline drop test.  The abdomen was then insufflated to 15 mmHg pressure.  An 11 mm trocar was introduced into the abdominal cavity under direct visualization without difficulty.  The patient was placed in reverse Trendelenburg position and an additional 1 mm trocar was placed in the epigastric region and 5 mm trochars were placed the right upper quadrant and right flank regions.  Liver was inspected and noted to be within normal limits.  The gallbladder was noted to have a very narrowed cystic duct and was floppy in nature.  The gallbladder was retracted in a dynamic fashion in order to provide a critical view of the triangle of Calot.  The cystic duct was first identified.  Its juncture to the infundibulum was fully identified.  Endoclips were placed proximally and distally on the cystic duct, and the cystic duct was divided.  This was likewise done to the cystic artery.  The gallbladder was freed away from the  gallbladder fossa using Bovie electrocautery.  The gallbladder was delivered through the epigastric trocar site using an Endo Catch bag.  The gallbladder fossa was inspected and no abnormal bleeding or bile leakage was noted.  Surgicel snow was placed into the gallbladder fossa.  All fluid and air were then evacuated from the abdominal cavity prior to removal of the trochars.  All wounds were irrigated with normal saline.  All wounds were injected with Exparel.  The skin incisions were closed using 4-0 Monocryl subcuticular sutures.  Dermabond was applied.  All tape and needle counts were correct at the end of the procedure.  The patient was extubated in the operating room and transferred to PACU in stable condition.  Complications: None  EBL: Minimal  Specimen: Gallbladder

## 2019-04-28 NOTE — Interval H&P Note (Signed)
History and Physical Interval Note:  04/28/2019 7:09 AM  Retha Hart-Lowe  has presented today for surgery, with the diagnosis of Gallbladder polyp.  The various methods of treatment have been discussed with the patient and family. After consideration of risks, benefits and other options for treatment, the patient has consented to  Procedure(s): LAPAROSCOPIC CHOLECYSTECTOMY (N/A) as a surgical intervention.  The patient's history has been reviewed, patient examined, no change in status, stable for surgery.  I have reviewed the patient's chart and labs.  Questions were answered to the patient's satisfaction.     Aviva Signs

## 2019-04-28 NOTE — Anesthesia Procedure Notes (Signed)
Procedure Name: Intubation Date/Time: 04/28/2019 7:35 AM Performed by: Andree Elk, Robbie Nangle A, CRNA Pre-anesthesia Checklist: Patient identified, Patient being monitored, Timeout performed, Emergency Drugs available and Suction available Patient Re-evaluated:Patient Re-evaluated prior to induction Oxygen Delivery Method: Circle system utilized Preoxygenation: Pre-oxygenation with 100% oxygen Induction Type: IV induction Ventilation: Mask ventilation without difficulty Laryngoscope Size: Mac and 3 Grade View: Grade II Tube type: Oral Tube size: 7.0 mm Number of attempts: 1 Airway Equipment and Method: Stylet Placement Confirmation: ETT inserted through vocal cords under direct vision,  positive ETCO2 and breath sounds checked- equal and bilateral Secured at: 21 cm Tube secured with: Tape Dental Injury: Teeth and Oropharynx as per pre-operative assessment

## 2019-04-28 NOTE — Anesthesia Preprocedure Evaluation (Addendum)
Anesthesia Evaluation  Patient identified by MRN, date of birth, ID band Patient awake    Reviewed: Allergy & Precautions, NPO status , Patient's Chart, lab work & pertinent test results, reviewed documented beta blocker date and time   History of Anesthesia Complications (+) Family history of anesthesia reactionHistory of anesthetic complications: brother had cardiac arrest during anesthesia.  Airway Mallampati: II  TM Distance: >3 FB Neck ROM: Full    Dental no notable dental hx. (+) Dental Advisory Given, Teeth Intact   Pulmonary former smoker,    Pulmonary exam normal breath sounds clear to auscultation       Cardiovascular Exercise Tolerance: Good hypertension, Pt. on medications and Pt. 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)    GI/Hepatic hiatal hernia, GERD  Medicated,  Endo/Other    Renal/GU Renal disease (acute kidney injury)     Musculoskeletal  (+) Fibromyalgia -  Abdominal   Peds  Hematology   Anesthesia Other Findings HLD (hyperlipidemia) OVERWEIGHT/OBESITY Essential hypertension Palpitations CHEST PAIN-UNSPECIFIED Aneurysm, cerebral, nonruptured Meningitis, unspecified(322.9) Chest pain GERD (gastroesophageal reflux disease) Chest tightness or pressure Dizziness Headache Cervical neuralgia Tension headache Neck pain Bilateral occipital neuralgia Acute diverticulitis AKI (acute kidney injury) ([place]) Diverticulitis of colon Dysphagia Gallbladder polyp Esophageal dysphagia NM Myocar Multi W/Spect W/Wall Motion / EF (Accession [id number]) (Order [id number]) Imaging Date: 08/17/2015 Department: [facility] Released By: Georgianne Fick Authorizing:  Lendon Colonel, NP Exam Status  Status Final (99) PACS Intelerad Image Link  Show images for NM Myocar Multi W/Spect W/Wall Motion / EF Study Result   There was no ST segment deviation noted during stress.  This is a low risk study.  The left ventricular ejection fraction is mildly decreased (45-54%). LVEF is 49% just slightly below normal, visually appears normal. Consider correlating with echo.  There is a small mild intensity distal anterior and apical defect with mild reversibility. The apex has normal wall motion. Finding is likely secondary to apical thinning, there is also significant radiotracer uptake in the adjacent gut which may create some artifact. Cannot rule would mild apical ischemia. Overall findings are low risk.   Scans on Order [id number]  Scan on 09/01/2015 11:58 AM by Donnajean Lopes, RN: [facility]  Low risk stress test. No further diagnostic testing.   MyChart Results Release  MyChart Status: Active Results Release Encounter-Level Documents - 08/17/2015:  Electronic signature on 08/17/2015 11:16 AM - 1 of 2 e-signatures recorded   Order-Level Documents - 08/17/2015:  Scan on 09/01/2015 11:58 AM by Donnajean Lopes, RN: [facility] account-Level Documents:  Nuclear Stress Findings  Isotope administration Rest isotope was administered  with an IV injection of 25 mCi Tc23m Sestamibi.  Rest SPECT images were obtained approximately 45 minutes post tracer injection.  Stress isotope was administered  with an IV injection of 30 mCi Tc61m Sestamibi  20 seconds post IV Lexiscan administration.  Stress SPECT images were obtained approximately 30 minutes post tracer injection. Nuclear Measurements Study was gated. Perfusion Summary There is a small mild intensity distal anterior and apical defct with mild reversibility. The apex has normal wall motion. Finding is likely secondary to  apical thinning, there is also significant radiotracer uptake in the adjacent gut which may create  some artifact. Cannot rule would mild apical ischemia. Overall findings are low risk. Overall Study Impression Myocardial perfusion is abnormal.   This is a low risk study.  Overall left ventricular systolic function was abnormal.    LV cavity size is normal.  The left ventricular ejection fraction is mildly decreased (45-54%).    Stress Findings  ECG Baseline ECG indicates sinus bradycardia. . Stress Findings A pharmacological stress test was performed using IV Lexiscan 0.4mg  over 10 seconds performed without concurrent submaximal exercise.   The patient reported symptoms consistent with lexiscan injection. The patient reported chest discomfort during the stress test. And Pressure in middle of back. Response to Stress There was no ST segment deviation noted during stress.  Arrhythmias during stress:  none.   Arrhythmias during recovery:  none.     There were no significant arrhythmias noted during the test.   ECG was interpretable and conclusive. Low risk stress test. No further diagnostic testing.     Reproductive/Obstetrics                            Anesthesia Physical Anesthesia Plan  ASA: III  Anesthesia Plan: General   Post-op Pain Management:    Induction: Intravenous  PONV Risk Score and Plan: 4 or greater and Midazolam, Ondansetron, Dexamethasone and Scopolamine patch - Pre-op  Airway Management Planned: Oral ETT  Additional Equipment:   Intra-op Plan:   Post-operative Plan: Extubation in OR  Informed Consent: I have reviewed the patients History and Physical, chart, labs and discussed the procedure including the risks, benefits and alternatives for the proposed anesthesia with the patient or authorized representative who has indicated his/her understanding and acceptance.     Dental advisory given  Plan Discussed with: CRNA  Anesthesia  Plan Comments:         Anesthesia Quick Evaluation

## 2019-04-28 NOTE — Anesthesia Postprocedure Evaluation (Signed)
Anesthesia Post Note  Patient: [name] [name]  Procedure(s) Performed: LAPAROSCOPIC CHOLECYSTECTOMY (N/A Abdomen)  Patient location during evaluation: PACU Anesthesia Type: General Level of consciousness: awake and alert, oriented and patient cooperative Pain management: pain level controlled Vital Signs Assessment: post-procedure vital signs reviewed and stable Respiratory status: spontaneous breathing Cardiovascular status: stable Postop Assessment: no apparent nausea or vomiting Anesthetic complications: no     Last Vitals:  Vitals:   04/28/19 0823 04/28/19 0830  BP: 117/82 101/76  Pulse: (!) 57 63  Resp: 16 18  Temp: 36.6 C   SpO2: 100% 96%    Last Pain:  Vitals:   04/28/19 0830  TempSrc:   PainSc: 0-No pain                 ADAMS, AMY A

## 2019-04-28 NOTE — Discharge Instructions (Signed)
Laparoscopic Cholecystectomy, Care After °This sheet gives you information about how to care for yourself after your procedure. Your health care provider may also give you more specific instructions. If you have problems or questions, contact your health care provider. °What can I expect after the procedure? °After the procedure, it is common to have: °· Pain at your incision sites. You will be given medicines to control this pain. °· Mild nausea or vomiting. °· Bloating and possible shoulder pain from the air-like gas that was used during the procedure. °Follow these instructions at home: °Incision care ° °· Follow instructions from your health care provider about how to take care of your incisions. Make sure you: °? Wash your hands with soap and water before you change your bandage (dressing). If soap and water are not available, use hand sanitizer. °? Change your dressing as told by your health care provider. °? Leave stitches (sutures), skin glue, or adhesive strips in place. These skin closures may need to be in place for 2 weeks or longer. If adhesive strip edges start to loosen and curl up, you may trim the loose edges. Do not remove adhesive strips completely unless your health care provider tells you to do that. °· Do not take baths, swim, or use a hot tub until your health care provider approves. Ask your health care provider if you can take showers. You may only be allowed to take sponge baths for bathing. °· Check your incision area every day for signs of infection. Check for: °? More redness, swelling, or pain. °? More fluid or blood. °? Warmth. °? Pus or a bad smell. °Activity °· Do not drive or use heavy machinery while taking prescription pain medicine. °· Do not lift anything that is heavier than 10 lb (4.5 kg) until your health care provider approves. °· Do not play contact sports until your health care provider approves. °· Do not drive for 24 hours if you were given a medicine to help you relax  (sedative). °· Rest as needed. Do not return to work or school until your health care provider approves. °General instructions °· Take over-the-counter and prescription medicines only as told by your health care provider. °· To prevent or treat constipation while you are taking prescription pain medicine, your health care provider may recommend that you: °? Drink enough fluid to keep your urine clear or pale yellow. °? Take over-the-counter or prescription medicines. °? Eat foods that are high in fiber, such as fresh fruits and vegetables, whole grains, and beans. °? Limit foods that are high in fat and processed sugars, such as fried and sweet foods. °Contact a health care provider if: °· You develop a rash. °· You have more redness, swelling, or pain around your incisions. °· You have more fluid or blood coming from your incisions. °· Your incisions feel warm to the touch. °· You have pus or a bad smell coming from your incisions. °· You have a fever. °· One or more of your incisions breaks open. °Get help right away if: °· You have trouble breathing. °· You have chest pain. °· You have increasing pain in your shoulders. °· You faint or feel dizzy when you stand. °· You have severe pain in your abdomen. °· You have nausea or vomiting that lasts for more than one day. °· You have leg pain. °This information is not intended to replace advice given to you by your health care provider. Make sure you discuss any questions you have   with your health care provider. Document Released: 04/15/2005 Document Revised: 03/28/2017 Document Reviewed: 10/02/2015 Elsevier Patient Education  2020 [employer] Anesthesia, Adult, Care After This sheet gives you information about how to care for yourself after your procedure. Your health care provider may also give you more specific instructions. If you have problems or questions, contact your health care provider. What can I expect after the procedure? After the  procedure, the following side effects are common:  Pain or discomfort at the IV site.  Nausea.  Vomiting.  Sore throat.  Trouble concentrating.  Feeling cold or chills.  Weak or tired.  Sleepiness and fatigue.  Soreness and body aches. These side effects can affect parts of the body that were not involved in surgery. Follow these instructions at home:  For at least 24 hours after the procedure:  Have a responsible adult stay with you. It is important to have someone help care for you until you are awake and alert.  Rest as needed.  Do not: ? Participate in activities in which you could fall or become injured. ? Drive. ? Use heavy machinery. ? Drink alcohol. ? Take sleeping pills or medicines that cause drowsiness. ? Make important decisions or sign legal documents. ? Take care of children on your own. Eating and drinking  Follow any instructions from your health care provider about eating or drinking restrictions.  When you feel hungry, start by eating small amounts of foods that are soft and easy to digest (bland), such as toast. Gradually return to your regular diet.  Drink enough fluid to keep your urine pale yellow.  If you vomit, rehydrate by drinking water, juice, or clear broth. General instructions  If you have sleep apnea, surgery and certain medicines can increase your risk for breathing problems. Follow instructions from your health care provider about wearing your sleep device: ? Anytime you are sleeping, including during daytime naps. ? While taking prescription pain medicines, sleeping medicines, or medicines that make you drowsy.  Return to your normal activities as told by your health care provider. Ask your health care provider what activities are safe for you.  Take over-the-counter and prescription medicines only as told by your health care provider.  If you smoke, do not smoke without supervision.  Keep all follow-up visits as told by your  health care provider. This is important. Contact a health care provider if:  You have nausea or vomiting that does not get better with medicine.  You cannot eat or drink without vomiting.  You have pain that does not get better with medicine.  You are unable to pass urine.  You develop a skin rash.  You have a fever.  You have redness around your IV site that gets worse. Get help right away if:  You have difficulty breathing.  You have chest pain.  You have blood in your urine or stool, or you vomit blood. Summary  After the procedure, it is common to have a sore throat or nausea. It is also common to feel tired.  Have a responsible adult stay with you for the first 24 hours after general anesthesia. It is important to have someone help care for you until you are awake and alert.  When you feel hungry, start by eating small amounts of foods that are soft and easy to digest (bland), such as toast. Gradually return to your regular diet.  Drink enough fluid to keep your urine pale yellow.  Return to your  normal activities as told by your health care provider. Ask your health care provider what activities are safe for you. This information is not intended to replace advice given to you by your health care provider. Make sure you discuss any questions you have with your health care provider. Document Released: 07/22/2000 Document Revised: 04/18/2017 Document Reviewed: 11/29/2016 Elsevier Patient Education  2020 [employer].     Can removed patch behind right ear Saturday, wash hands after removal No additional numbing medications until after Saturday

## 2019-04-29 ENCOUNTER — Telehealth (INDEPENDENT_AMBULATORY_CARE_PROVIDER_SITE_OTHER): Payer: Self-pay | Admitting: *Deleted

## 2019-04-29 LAB — SURGICAL PATHOLOGY

## 2019-04-29 NOTE — Telephone Encounter (Signed)
Patient called and left a message asking for a call. She had tried calling Dr.Jenkins office but did not reach anyone.  She had Gallbladder surgery yesterday, she has been experiencing some irregular heart beat. Blood sugar is normally in the 80's but this morning it was 129. Patient is not sure that this is normal.  She mentions that she takes Cardizem and she was given Hydrocodone. She stopped the Hydrocodone within 4 hours she noted that the irregulat heartbeat was better.  She is needing reassurance.  Call back number is [phone].

## 2019-04-29 NOTE — Telephone Encounter (Signed)
I called patient - had 5 hr stretch of irregular heartbeat last night, has hx of irregular heartbeat but usually shorter intervals. On cardizem. She stopped taking Norco. BP was 120s/90s. No chest pain or SOB w/ irregular HB. Feels back in regular rhythm-currently HR is 65. No abd pain currently.   She wants to take tylenol instead of hydrocodone, concerned hydrocodone may have caused irregular HB.   Blood sugar was 124 most recently. She has done clear liquids this morning.   ER alarm symptoms reviewed to present with. All questions answered.

## 2019-05-03 ENCOUNTER — Other Ambulatory Visit: Payer: Self-pay

## 2019-05-03 ENCOUNTER — Other Ambulatory Visit (HOSPITAL_COMMUNITY): Payer: BC Managed Care – PPO

## 2019-05-03 ENCOUNTER — Other Ambulatory Visit (HOSPITAL_COMMUNITY)
Admission: RE | Admit: 2019-05-03 | Discharge: 2019-05-03 | Disposition: A | Discharge status: Home / Self Care | Payer: BC Managed Care – PPO | Source: Ambulatory Visit | Attending: General Surgery | Admitting: General Surgery

## 2019-05-04 ENCOUNTER — Telehealth (INDEPENDENT_AMBULATORY_CARE_PROVIDER_SITE_OTHER): Payer: Self-pay | Admitting: General Surgery

## 2019-05-04 ENCOUNTER — Other Ambulatory Visit: Payer: Self-pay

## 2019-05-04 DIAGNOSIS — Z09 Encounter for follow-up examination after completed treatment for conditions other than malignant neoplasm: Secondary | ICD-10-CM

## 2019-05-04 NOTE — Telephone Encounter (Signed)
Telephone visit performed.  States the hydrocodone was too strong and is taking Tylenol.  Mild rash on abdsominal wall, resolving.  No fever, chills.  No nausea, vomiting.  Stopped blood pressure medication due to low blood pressure.

## 2019-05-05 NOTE — Telephone Encounter (Signed)
Duplicate charting.

## 2019-05-06 ENCOUNTER — Telehealth: Payer: Self-pay

## 2019-05-06 NOTE — Telephone Encounter (Signed)
Patient complains of sternum pressure. Denies shortness of breath. O2 sats 97 percent. Patient states she has a rash and has been using hydrocortisone cream. Incisions look well.  Denies fever, chills, nausea or vomiting. She still has some bruising. Was instructed to use benadryl for the rash and to stop the hydrocortisone. Last bowel movement three days ago. Instructed to try Miralax and take daily until bowel movements become regular. Increase water intake. May apply heat to the area. Patient states she is up and about. Call if no better.

## 2019-05-31 ENCOUNTER — Emergency Department (HOSPITAL_COMMUNITY)
Admission: EM | Admit: 2019-05-31 | Discharge: 2019-05-31 | Disposition: A | Discharge status: Home / Self Care | Payer: Self-pay | Attending: Emergency Medicine | Admitting: Emergency Medicine

## 2019-05-31 ENCOUNTER — Ambulatory Visit (HOSPITAL_COMMUNITY): Payer: Self-pay

## 2019-05-31 ENCOUNTER — Encounter (HOSPITAL_COMMUNITY): Payer: Self-pay | Admitting: Emergency Medicine

## 2019-05-31 ENCOUNTER — Emergency Department (HOSPITAL_COMMUNITY): Payer: Self-pay

## 2019-05-31 ENCOUNTER — Other Ambulatory Visit: Payer: Self-pay

## 2019-05-31 DIAGNOSIS — N189 Chronic kidney disease, unspecified: Secondary | ICD-10-CM | POA: Insufficient documentation

## 2019-05-31 DIAGNOSIS — I129 Hypertensive chronic kidney disease with stage 1 through stage 4 chronic kidney disease, or unspecified chronic kidney disease: Secondary | ICD-10-CM | POA: Insufficient documentation

## 2019-05-31 DIAGNOSIS — Z79899 Other long term (current) drug therapy: Secondary | ICD-10-CM | POA: Insufficient documentation

## 2019-05-31 DIAGNOSIS — R1013 Epigastric pain: Secondary | ICD-10-CM | POA: Insufficient documentation

## 2019-05-31 DIAGNOSIS — Z87891 Personal history of nicotine dependence: Secondary | ICD-10-CM | POA: Insufficient documentation

## 2019-05-31 DIAGNOSIS — Z7982 Long term (current) use of aspirin: Secondary | ICD-10-CM | POA: Insufficient documentation

## 2019-05-31 DIAGNOSIS — R0789 Other chest pain: Secondary | ICD-10-CM | POA: Insufficient documentation

## 2019-05-31 LAB — COMPREHENSIVE METABOLIC PANEL
ALT: 11 U/L (ref 0–44)
AST: 18 U/L (ref 15–41)
Albumin: 4 g/dL (ref 3.5–5.0)
Alkaline Phosphatase: 53 U/L (ref 38–126)
Anion gap: 11 (ref 5–15)
BUN: 14 mg/dL (ref 6–20)
CO2: 29 mmol/L (ref 22–32)
Calcium: 10.2 mg/dL (ref 8.9–10.3)
Chloride: 101 mmol/L (ref 98–111)
Creatinine, Ser: 1.17 mg/dL — ABNORMAL HIGH (ref 0.44–1.00)
GFR calc Af Amer: 59 mL/min — ABNORMAL LOW (ref >60)
GFR calc non Af Amer: 51 mL/min — ABNORMAL LOW (ref >60)
Glucose, Bld: 97 mg/dL (ref 70–99)
Potassium: 3.1 mmol/L — ABNORMAL LOW (ref 3.5–5.1)
Sodium: 141 mmol/L (ref 135–145)
Total Bilirubin: 0.9 mg/dL (ref 0.3–1.2)
Total Protein: 7.6 g/dL (ref 6.5–8.1)

## 2019-05-31 LAB — CBC WITH DIFFERENTIAL/PLATELET
Abs Immature Granulocytes: 0.01 10*3/uL (ref 0.00–0.07)
Basophils Absolute: 0 10*3/uL (ref 0.0–0.1)
Basophils Relative: 0 %
Eosinophils Absolute: 0.1 10*3/uL (ref 0.0–0.5)
Eosinophils Relative: 1 %
HCT: 40.5 % (ref 36.0–46.0)
Hemoglobin: 13.4 g/dL (ref 12.0–15.0)
Immature Granulocytes: 0 %
Lymphocytes Relative: 22 %
Lymphs Abs: 1.9 10*3/uL (ref 0.7–4.0)
MCH: 30.2 pg (ref 26.0–34.0)
MCHC: 33.1 g/dL (ref 30.0–36.0)
MCV: 91.2 fL (ref 80.0–100.0)
Monocytes Absolute: 0.5 10*3/uL (ref 0.1–1.0)
Monocytes Relative: 6 %
Neutro Abs: 6.3 10*3/uL (ref 1.7–7.7)
Neutrophils Relative %: 71 %
Platelets: 362 10*3/uL (ref 150–400)
RBC: 4.44 MIL/uL (ref 3.87–5.11)
RDW: 12.8 % (ref 11.5–15.5)
WBC: 8.8 10*3/uL (ref 4.0–10.5)
nRBC: 0 % (ref 0.0–0.2)

## 2019-05-31 LAB — TROPONIN I (HIGH SENSITIVITY)
Troponin I (High Sensitivity): 31 ng/L — ABNORMAL HIGH (ref <18)
Troponin I (High Sensitivity): 26 ng/L — ABNORMAL HIGH (ref <18)
Troponin I (High Sensitivity): 15 ng/L (ref <18)

## 2019-05-31 LAB — D-DIMER, QUANTITATIVE: D-Dimer, Quant: 0.58 ug/mL-FEU — ABNORMAL HIGH (ref 0.00–0.50)

## 2019-05-31 LAB — LIPASE, BLOOD: Lipase: 26 U/L (ref 11–51)

## 2019-05-31 MED ORDER — IOHEXOL 350 MG/ML SOLN
100.0000 mL | Freq: Once | INTRAVENOUS | Status: AC | PRN
  Administered 2019-05-31: 100 mL via INTRAVENOUS

## 2019-05-31 NOTE — ED Notes (Signed)
Transported to xray 

## 2019-05-31 NOTE — ED Triage Notes (Signed)
Pt reports having gall bladder sx on 12/30. Has not advanced to normal diet due to intolerance. For the past few days has been having epigastric pain moving into back. Given Asa 324 by ems nitro x1.

## 2019-05-31 NOTE — ED Notes (Signed)
Patient transported to ct at this time

## 2019-05-31 NOTE — ED Provider Notes (Signed)
[facility] EMERGENCY DEPARTMENT Provider Note   CSN: [id number] Arrival date & time: 05/31/19  1218     History Chief Complaint  Patient presents with  . Abdominal Pain    [name] [name] is a 59 y.o. female presenting for evaluation of upper abd/substernal chest pain.   Patient states she was awoken from sleep last night around 1230 with lower chest/upper abdominal pain.  She describes it as a dull feeling that turned into a pressure.  She sat up and drink milk of magnesia, which mildly improved her symptoms.  When she woke up this morning, discomfort returned and has been constant.  She states she has had similar episodes in the past, but none since her most recent lap chole surgery on December 30.  She reports associated mild lightheadedness and nausea without vomiting.  No associated shortness of breath.  Pain radiates up into her chest into her right upper back.  She was given aspirin and nitroglycerin with EMS, this improved her pain.  She denies fevers, chills, cough, lower abdominal pain, urinary symptoms, abnormal bowel movements.  She denies recent travel, immobilization, history of cancer, history of previous DVT/PE, or hormone use.  She denies personal cardiac history or family history of early cardiac death.  HPI     Past Medical History:  Diagnosis Date  . Aneurysm ([place])    brain  . Chest pain    denied on 06/18/2012  . Chronic kidney disease    L - upper pole- defect, doesn't effect     . Family history of anesthesia complication    brother & neice "flat lined" during induction: neice d/t a medication given for a blood clotting problem; brother d/t "miscalculated amount of anesthesia"  . Fibromyalgia   . GERD (gastroesophageal reflux disease)    h/o colitis   . H. pylori infection    2012- stomach biopsy  . H/O hiatal hernia   . Headache(784.0)    using tylenol prn  . Hyperlipidemia   . Hypertension   . Hypoglycemia   . Normal cardiac stress test    pt.  released fr. Dr. [name] care in 10/2011, all findings w/i normal limits  . Overweight(278.02)    Obesity  . Palpitations   . Sinusitis, acute    seen by PCP- 06/17/2012, will treat /w augmentin & tussin/DM  . Uterine fibroid    pt. states that she has a "closed cervix"     Patient Active Problem List   Diagnosis Date Noted  . Biliary colic   . Dysphagia 03/10/2019  . Gallbladder polyp 03/10/2019  . Esophageal dysphagia 03/10/2019  . Diverticulitis of colon 06/09/2018  . Acute diverticulitis 05/20/2018  . AKI (acute kidney injury) ([place]) 05/20/2018  . Neck pain 12/02/2016  . Bilateral occipital neuralgia 12/02/2016  . Cervical neuralgia 03/25/2016  . Tension headache 03/25/2016  . Chest tightness or pressure 04/03/2014  . Dizziness 04/03/2014  . Headache 04/03/2014  . Chest pain 04/02/2014  . GERD (gastroesophageal reflux disease) 04/02/2014  . Meningitis, unspecified(322.9) 08/11/2012  . 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 HLD (hyperlipidemia) 01/05/2009  . OVERWEIGHT/OBESITY 01/05/2009  . Essential hypertension 01/05/2009  . Palpitations 01/05/2009  . CHEST PAIN-UNSPECIFIED 01/05/2009    Past Surgical History:  Procedure Laterality Date  . BIOPSY  04/14/2019   Procedure: BIOPSY;  Surgeon: Rogene Houston, MD;  Location: [facility];  Service: Endoscopy;;  antrum  . CHOLECYSTECTOMY N/A 04/28/2019   Procedure: LAPAROSCOPIC CHOLECYSTECTOMY;  Surgeon: Aviva Signs, MD;  Location: [facility] ORS;  Service: General;  Laterality: N/A;  . COLONOSCOPY    . COLONOSCOPY N/A 11/26/2018   Procedure: COLONOSCOPY;  Surgeon: Rogene Houston, MD;  Location: [facility];  Service: Endoscopy;  Laterality: N/A;  1:00-12:00pm  . CRANIOTOMY Right 06/24/2012   Procedure: CRANIOTOMY INTRACRANIAL ANEURYSM FOR CAROTID;  Surgeon: [name], MD;  Location: [facility] OR[facility];  Service: Neurosurgery;  Laterality: Right;  RIGHT Pterional Craniotomy for aneurysm  . ESOPHAGOGASTRODUODENOSCOPY  N/A 04/14/2019   Procedure: ESOPHAGOGASTRODUODENOSCOPY (EGD) WITH POSSIBLE ESOPHAGEAL DILATION;  Surgeon: Rogene Houston, MD;  Location: [facility];  Service: Endoscopy;  Laterality: N/A;  300pm  . LIPOMA EXCISION     buttock 10/2010     OB History   No obstetric history on file.     Family History  Problem Relation Age of Onset  . Hypertension Other   . Coronary artery disease Other   . Dementia Mother   . Dementia Maternal Aunt     Social History   Tobacco Use  . Smoking status: Former Smoker    Packs/day: 0.80    Years: 15.00    Pack years: 12.00    Types: Cigarettes    Start date: 02/13/1989    Quit date: 04/28/2004    Years since quitting: 15.0  . Smokeless tobacco: Never Used  Substance Use Topics  . Alcohol use: No    Alcohol/week: 0.0 standard drinks  . Drug use: No    Home Medications Prior to Admission medications   Medication Sig Start Date End Date Taking? Authorizing Provider  acetaminophen (TYLENOL) 650 MG CR tablet Take 650 mg by mouth 2 (two) times daily as needed for pain.   Yes [provider]  albuterol (PROAIR HFA) 108 (90 Base) MCG/ACT inhaler Inhale 1-2 puffs into the lungs every 4 (four) hours as needed for wheezing or shortness of breath.  06/06/15  Yes [provider]  aspirin EC 81 MG tablet Take 81 mg by mouth daily at 12 noon.  04/17/19  Yes Rehman, Mechele Dawley, MD  chlorthalidone (HYGROTON) 25 MG tablet Take 25 mg by mouth daily at 12 noon.  12/26/18  Yes [provider]  Cholecalciferol (VITAMIN D) 50 MCG (2000 UT) CAPS Take 2,000 Units by mouth daily.   Yes [provider]  diltiazem (CARDIZEM CD) 120 MG 24 hr capsule Take 120 mg by mouth at bedtime.  06/09/18  Yes [provider]  KLOR-CON M20 20 MEQ tablet Take 20 mEq by mouth at bedtime.  06/09/18  Yes [provider]  loratadine (CLARITIN) 10 MG tablet Take 10 mg by mouth daily at 12 noon.    Yes [provider]  Menthol,  Topical Analgesic, (ICY HOT BACK EX) Apply 1 application topically daily as needed (for pain).   Yes [provider]  Menthol-Methyl Salicylate (SALONPAS PAIN RELIEF PATCH EX) Apply 2-3 patches topically daily as needed (back / shoulder pain).   Yes [provider]  metoprolol tartrate (LOPRESSOR) 50 MG tablet Take 1 tablet (50 mg total) by mouth 2 (two) times daily. 08/11/17 05/31/19 Yes Fay Records, MD  pantoprazole (PROTONIX) 40 MG tablet Take 1 tablet (40 mg total) by mouth daily. 03/10/19  Yes Noralyn Pick, NP  rosuvastatin (CRESTOR) 10 MG tablet Take 10 mg by mouth every other day. At 12 noon   Yes [provider]  dicyclomine (BENTYL) 10 MG capsule TAKE 1 CAPSULE (10 MG TOTAL) BY MOUTH 2 (TWO) TIMES  DAILY BEFORE A MEAL. Patient not taking: Reported on 05/31/2019 02/18/19   Rogene Houston, MD  HYDROcodone-acetaminophen (NORCO) 5-325 MG tablet Take 1 tablet by mouth every 4 (four) hours as needed for moderate pain. Patient not taking: Reported on 05/31/2019 04/28/19   Aviva Signs, MD    Allergies    Ace inhibitors, Azithromycin, Ciprofloxacin, Nsaids, Other, Statins, Medrol [methylprednisolone], and Tetracyclines & related  Review of Systems   Review of Systems  Gastrointestinal: Positive for nausea.       Upper abd/lower chest pain  All other systems reviewed and are negative.   Physical Exam Updated Vital Signs BP 125/65   Pulse 74   Temp 99 F (37.2 C) (Oral)   Resp 16   Ht 5\' 3"  (1.6 m)   Wt 87.1 kg   LMP 08/25/2012   SpO2 100%   BMI 34.01 kg/m   Physical Exam Vitals and nursing note reviewed.  Constitutional:      General: She is not in acute distress.    Appearance: She is well-developed.     Comments: Resting in the bed in no acute distress  HENT:     Head: Normocephalic and atraumatic.  Eyes:     Conjunctiva/sclera: Conjunctivae normal.     Pupils: Pupils are equal, round, and reactive to light.  Cardiovascular:      Rate and Rhythm: Normal rate and regular rhythm.     Pulses: Normal pulses.  Pulmonary:     Effort: Pulmonary effort is normal. No respiratory distress.     Breath sounds: Normal breath sounds. No wheezing.     Comments: Clear lung sounds in all fields.  Tenderness palpation of the anterior chest wall. Abdominal:     General: Bowel sounds are normal.     Palpations: Abdomen is soft.     Tenderness: There is abdominal tenderness in the epigastric area.     Comments: Generalized tenderness palpation of the abdomen.  No rigidity, guarding, distention.  Negative rebound.  No peritonitis.  Musculoskeletal:        General: Normal range of motion.     Cervical back: Normal range of motion and neck supple.     Right lower leg: No edema.     Left lower leg: No edema.     Comments: No leg pain or swelling  Skin:    General: Skin is warm and dry.     Capillary Refill: Capillary refill takes less than 2 seconds.  Neurological:     Mental Status: She is alert and oriented to person, place, and time.     ED Results / Procedures / Treatments   Labs (all labs ordered are listed, but only abnormal results are displayed) Labs Reviewed  COMPREHENSIVE METABOLIC PANEL - Abnormal; Notable for the following components:      Result Value   Potassium 3.1 (*)    Creatinine, Ser 1.17 (*)    GFR calc non Af Amer 51 (*)    GFR calc Af Amer 59 (*)    All other components within normal limits  D-DIMER, QUANTITATIVE (NOT AT [facility]) - Abnormal; Notable for the following components:   D-Dimer, Quant 0.58 (*)    All other components within normal limits  TROPONIN I (HIGH SENSITIVITY) - Abnormal; Notable for the following components:   Troponin I (High Sensitivity) 31 (*)    All other components within normal limits  TROPONIN I (HIGH SENSITIVITY) - Abnormal; Notable for the following components:   Troponin I (High  Sensitivity) 26 (*)    All other components within normal limits  CBC WITH DIFFERENTIAL/PLATELET   LIPASE, BLOOD  TROPONIN I (HIGH SENSITIVITY)    EKG EKG Interpretation  Date/Time:  Monday May 31 2019 20:59:53 EST Ventricular Rate:  63 PR Interval:    QRS Duration: 98 QT Interval:  441 QTC Calculation: 452 R Axis:   3 Text Interpretation: Sinus rhythm Prolonged PR interval No significant change was found Confirmed by Ezequiel Essex [phone]) on 05/31/2019 9:08:57 PM   Radiology DG Chest 2 View  Result Date: 05/31/2019 CLINICAL DATA:  Epigastric and sternal pain. EXAM: CHEST - 2 VIEW COMPARISON:  None. FINDINGS: Normal mediastinum and cardiac silhouette. Normal pulmonary vasculature. No evidence of effusion, infiltrate, or pneumothorax. No acute bony abnormality. IMPRESSION: Normal chest radiograph. Electronically Signed   By: Suzy Bouchard M.D.   On: 05/31/2019 15:41   CT Angio Chest PE W and/or Wo Contrast  Result Date: 05/31/2019 CLINICAL DATA:  Shortness of breath, chest pain, pressure, abdominal pain, recent gallbladder surgery 12 30 EXAM: CT ANGIOGRAPHY CHEST WITH CONTRAST TECHNIQUE: Multidetector CT imaging of the chest was performed using the standard protocol during bolus administration of intravenous contrast. Multiplanar CT image reconstructions and MIPs were obtained to evaluate the vascular anatomy. CONTRAST:  146mL OMNIPAQUE IOHEXOL 350 MG/ML SOLN COMPARISON:  None. FINDINGS: Cardiovascular: There is a optimal opacification of the pulmonary arteries. There is no central,segmental, or subsegmental filling defects within the pulmonary arteries. The heart is normal in size. No pericardial effusion or thickening. No evidence right heart strain. There is normal three-vessel brachiocephalic anatomy without proximal stenosis. The thoracic aorta is normal in appearance. Mediastinum/Nodes: No hilar, mediastinal, or axillary adenopathy. Thyroid gland, trachea, and esophagus demonstrate no significant findings. Lungs/Pleura: The lungs are clear. No pleural effusion or  pneumothorax. No airspace consolidation. Upper Abdomen: No acute abnormalities present in the visualized portions of the upper abdomen. Musculoskeletal: No chest wall abnormality. No acute or significant osseous findings. Review of the MIP images confirms the above findings. Abdomen/pelvis: Hepatobiliary: The liver is normal in density without focal abnormality.The main portal vein is patent. The patient is status post cholecystectomy. No biliary ductal dilation. Pancreas: Unremarkable. No pancreatic ductal dilatation or surrounding inflammatory changes. Spleen: Normal in size without focal abnormality. Adrenals/Urinary Tract: Both adrenal glands appear normal. Scattered tiny hypodense lesions seen within both kidneys. These are too small to further characterize, however likely simple renal cyst. No renal or collecting system calculi. No hydronephrosis. Bladder is unremarkable. Stomach/Bowel: The stomach, small bowel, and colon are normal in appearance. No inflammatory changes, wall thickening, or obstructive findings.The appendix is normal. Vascular/Lymphatic: There are no enlarged mesenteric, retroperitoneal, or pelvic lymph nodes. Mild scattered aortic atherosclerosis is seen. Reproductive: The uterus and adnexa are unremarkable. Other: No evidence of abdominal wall mass or hernia. Musculoskeletal: No acute or significant osseous findings. Scattered sclerotic lesion seen within the left ilium and left femoral head, likely bone islands. IMPRESSION: No central, segmental, or subsegmental pulmonary embolism. No acute intrathoracic, abdominal, or pelvic pathology to explain patient's symptoms. Status post cholecystectomy. Aortic Atherosclerosis (ICD10-I70.0). Electronically Signed   By: Prudencio Pair M.D.   On: 05/31/2019 20:38   CT ABDOMEN PELVIS W CONTRAST  Result Date: 05/31/2019 CLINICAL DATA:  Shortness of breath, chest pain, pressure, abdominal pain, recent gallbladder surgery 12 30 EXAM: CT ANGIOGRAPHY CHEST  WITH CONTRAST TECHNIQUE: Multidetector CT imaging of the chest was performed using the standard protocol during bolus administration of intravenous contrast. Multiplanar CT image  reconstructions and MIPs were obtained to evaluate the vascular anatomy. CONTRAST:  194mL OMNIPAQUE IOHEXOL 350 MG/ML SOLN COMPARISON:  None. FINDINGS: Cardiovascular: There is a optimal opacification of the pulmonary arteries. There is no central,segmental, or subsegmental filling defects within the pulmonary arteries. The heart is normal in size. No pericardial effusion or thickening. No evidence right heart strain. There is normal three-vessel brachiocephalic anatomy without proximal stenosis. The thoracic aorta is normal in appearance. Mediastinum/Nodes: No hilar, mediastinal, or axillary adenopathy. Thyroid gland, trachea, and esophagus demonstrate no significant findings. Lungs/Pleura: The lungs are clear. No pleural effusion or pneumothorax. No airspace consolidation. Upper Abdomen: No acute abnormalities present in the visualized portions of the upper abdomen. Musculoskeletal: No chest wall abnormality. No acute or significant osseous findings. Review of the MIP images confirms the above findings. Abdomen/pelvis: Hepatobiliary: The liver is normal in density without focal abnormality.The main portal vein is patent. The patient is status post cholecystectomy. No biliary ductal dilation. Pancreas: Unremarkable. No pancreatic ductal dilatation or surrounding inflammatory changes. Spleen: Normal in size without focal abnormality. Adrenals/Urinary Tract: Both adrenal glands appear normal. Scattered tiny hypodense lesions seen within both kidneys. These are too small to further characterize, however likely simple renal cyst. No renal or collecting system calculi. No hydronephrosis. Bladder is unremarkable. Stomach/Bowel: The stomach, small bowel, and colon are normal in appearance. No inflammatory changes, wall thickening, or obstructive  findings.The appendix is normal. Vascular/Lymphatic: There are no enlarged mesenteric, retroperitoneal, or pelvic lymph nodes. Mild scattered aortic atherosclerosis is seen. Reproductive: The uterus and adnexa are unremarkable. Other: No evidence of abdominal wall mass or hernia. Musculoskeletal: No acute or significant osseous findings. Scattered sclerotic lesion seen within the left ilium and left femoral head, likely bone islands. IMPRESSION: No central, segmental, or subsegmental pulmonary embolism. No acute intrathoracic, abdominal, or pelvic pathology to explain patient's symptoms. Status post cholecystectomy. Aortic Atherosclerosis (ICD10-I70.0). Electronically Signed   By: Prudencio Pair M.D.   On: 05/31/2019 20:38    Procedures Procedures (including critical care time)  Medications Ordered in ED Medications  iohexol (OMNIPAQUE) 350 MG/ML injection 100 mL (100 mLs Intravenous Contrast Given 05/31/19 1947)    ED Course  I have reviewed the triage vital signs and the nursing notes.  Pertinent labs & imaging results that were available during my care of the patient were reviewed by me and considered in my medical decision making (see chart for details).    MDM Rules/Calculators/A&P                      Patient presenting for evaluation of upper abdominal pain and lower sternal chest pain.  Physical exam reassuring, patient appears nontoxic.  Consider costochondritis, his pain is reproducible with palpation of the anterior chest wall.  Consider GERD symptoms are worse when patient is laying flat.  Consider bile leak/post surgical problem.  Consider PE due to recent surgery.  Consider ACS, although low suspicion as patient is without risk factors.  Will obtain labs, EKG, chest x-ray.  EKG without STEMI.  Labs show mild elevation in creatinine at 1.17.  Troponin 31, which is slightly elevated but not extremely elevated.  D-dimer minimally elevated at 0.58.  Will obtain CTA, CT abdomen pelvis for  further evaluation, and trend troponins.  X-ray viewed interpreted by me, normal including proximal effusion.  Repeat troponin 26.  Patient remains symptom-free.  Will continue to trend.  CT of the chest and CT abdomen pelvis reassuring, no acute or normalities noted.  Repeat troponin XV, patient remained symptom-free.  Repeat EKG without significant change. Case discussed with attending, Dr. Wyvonnia Dusky evaluated the pt. discussed findings with patient.  Discussed at this time, there does not appear to be a life-threatening or emergent condition requiring hospitalization.  Discussed continued symptomatic treatment at home, and follow-up outpatient with cardiology and GI as needed.  At this time, patient appears safe for discharge.  Return precautions given.  Patient states she understands and agrees to plan.  Final Clinical Impression(s) / ED Diagnoses Final diagnoses:  Epigastric pain  Atypical chest pain    Rx / DC Orders ED Discharge Orders    None       Franchot Heidelberg, PA-C 05/31/19 2139    Ezequiel Essex, MD 06/01/19 1322

## 2019-05-31 NOTE — Discharge Instructions (Signed)
Continue taking home medications as prescribed. Take Tums or Maalox as needed for pain returns. Follow-up with your cardiologist and GI doctor for further evaluation of your symptoms. Return to the emergency room if you develop high fevers, persistent vomiting, severe worsening pain, difficulty breathing, or any new, worsening, or concerning symptoms.

## 2019-06-03 ENCOUNTER — Encounter: Payer: Self-pay | Admitting: Cardiology

## 2019-06-03 ENCOUNTER — Telehealth (INDEPENDENT_AMBULATORY_CARE_PROVIDER_SITE_OTHER): Payer: BC Managed Care – PPO | Admitting: Cardiology

## 2019-06-03 VITALS — BP 106/72 | HR 70 | Ht 63.5 in | Wt 189.0 lb

## 2019-06-03 DIAGNOSIS — R0789 Other chest pain: Secondary | ICD-10-CM

## 2019-06-03 DIAGNOSIS — R002 Palpitations: Secondary | ICD-10-CM

## 2019-06-03 DIAGNOSIS — I10 Essential (primary) hypertension: Secondary | ICD-10-CM

## 2019-06-03 MED ORDER — CHLORTHALIDONE 25 MG PO TABS: 12.5000 mg | ORAL_TABLET | Freq: Every day | ORAL | 3 refills | Status: DC

## 2019-06-03 NOTE — Progress Notes (Signed)
Virtual Visit via Telephone Note   This visit type was conducted due to national recommendations for restrictions regarding the COVID-19 Pandemic (e.g. social distancing) in an effort to limit this patient's exposure and mitigate transmission in our community.  Due to her co-morbid illnesses, this patient is at least at moderate risk for complications without adequate follow up.  This format is felt to be most appropriate for this patient at this time.  The patient did not have access to video technology/had technical difficulties with video requiring transitioning to audio format only (telephone).  All issues noted in this document were discussed and addressed.  No physical exam could be performed with this format.  Please refer to the patient's chart for her  consent to telehealth for [facility].   Date:  06/03/2019   ID:  [name] [name], DOB December 28, 1960, MRN [id number]  Patient Location: Home Provider Location: Office  PCP:  [name], MD  Cardiologist:  Carlyle Dolly, MD  Electrophysiologist:  None   Evaluation Performed:  Follow-Up Visit  Chief Complaint:  Follow up   History of Present Illness:    [name] [name] is a 59 y.o. female with seen today for follow up of the following medical problems.    1. Palpitations - admit 03/2014 with palpitations and chest pain. On that day was hypertensive and tachycardic on admission. - MPI without evidence of ischemia, LVEF 78%. Echo "normal LVEF". TSH 1.4. Event monitor with occasional PACs, no significant arrhythmias - holter 11/2016 no arrhythmias  - initially dilt stopped after recent admission with diverticulitis and hypotension. Since discharge bp's have increased, restarte back on 120mg  daily   - no recent palpitations.     2. HTN - hydralazinepreviously stopped due to concerns for possible drug induced lupus. Symptoms better. - her dilt was prevoiusly decreased, with this significant elvation in bp's  and she went back to takeing 180mg  bid - has been on fairly high dose dual av nodal agents due to history of chronic palpitations, has tolerated regimen.   - diltiazem stopped during Jan 2020 admission with acute diverticulitis due to low bp's. Had also had some prior low heart rates. Chlothalidone stopped as well - pcp restarted chlorthalidione and diltiazem for recurrent high bp's  - compliant with meds   3. Diverticulitis - admitted Jan 2020 with abdominal pain and diarrhea, CT with evidecne of possible diverticulitis - treated with abx  4. Chest pain - long history of atypical chest pain - she completed a lexiscan 03/2014  that was overall low risk, small apical defect probable artifact. - seen at [facility] 10/17 with chest pain. CT PE negative.  07/2015 nuclear probable apical thinning, cannot exlcude mild apical ischemia - seen in ER 08/2017 with chest pain, worst with movement and cough. No evidence of ischemia by EKG or enzymes   05/31/2019 ER visit with epigastric/lower chest pain - from ER notes tender to palpation WBC 8.8 Hgb 13.4 K 3.1 Cr 1.17 hstrop 31-->26-->15 Ddimer 0.58  EKG SR no acute ishcemic changes - CT PE: no PE, no acute process - CT A/P no acute process  - recently had gallbladder removed , pain started about 1 day after - pressure epigastric and midchest, below sternum. Constant pain more than 10 hours. +tachycardia, elevated blood pressures. Pain in midback.  - has had issues with constipiation after surgery.    5. Recent cholecystetcomy  The patient does not have symptoms concerning for COVID-19 infection (fever, chills, cough, or new shortness of  breath).    Past Medical History:  Diagnosis Date  . 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 Chest pain    denied on 06/18/2012  . Chronic kidney disease    L - upper pole- defect, doesn't effect     . Family history of anesthesia complication    brother & neice "flat lined" during induction: neice d/t a  medication given for a blood clotting problem; brother d/t "miscalculated amount of anesthesia"  . Fibromyalgia   . GERD (gastroesophageal reflux disease)    h/o colitis   . H. pylori infection    2012- stomach biopsy  . H/O hiatal hernia   . Headache(784.0)    using tylenol prn  . Hyperlipidemia   . Hypertension   . Hypoglycemia   . Normal cardiac stress test    pt. released fr. Dr. [name] care in 10/2011, all findings w/i normal limits  . Overweight(278.02)    Obesity  . Palpitations   . Sinusitis, acute    seen by PCP- 06/17/2012, will treat /w augmentin & tussin/DM  . Uterine fibroid    pt. states that she has a "closed cervix"    Past Surgical History:  Procedure Laterality Date  . BIOPSY  04/14/2019   Procedure: BIOPSY;  Surgeon: Rogene Houston, MD;  Location: [facility];  Service: Endoscopy;;  antrum  . CHOLECYSTECTOMY N/A 04/28/2019   Procedure: LAPAROSCOPIC CHOLECYSTECTOMY;  Surgeon: Aviva Signs, MD;  Location: [facility] ORS;  Service: General;  Laterality: N/A;  . COLONOSCOPY    . COLONOSCOPY N/A 11/26/2018   Procedure: COLONOSCOPY;  Surgeon: Rogene Houston, MD;  Location: [facility];  Service: Endoscopy;  Laterality: N/A;  1:00-12:00pm  . CRANIOTOMY Right 06/24/2012   Procedure: CRANIOTOMY INTRACRANIAL ANEURYSM FOR CAROTID;  Surgeon: [name], MD;  Location: [facility] OR[facility];  Service: Neurosurgery;  Laterality: Right;  RIGHT Pterional Craniotomy for aneurysm  . ESOPHAGOGASTRODUODENOSCOPY N/A 04/14/2019   Procedure: ESOPHAGOGASTRODUODENOSCOPY (EGD) WITH POSSIBLE ESOPHAGEAL DILATION;  Surgeon: Rogene Houston, MD;  Location: [facility];  Service: Endoscopy;  Laterality: N/A;  300pm  . LIPOMA EXCISION     buttock 10/2010     No outpatient medications have been marked as taking for the 06/03/19 encounter (Appointment) with Arnoldo Lenis, MD.     Allergies:   Ace inhibitors, Azithromycin, Ciprofloxacin, Nsaids, Other, Statins, Medrol [methylprednisolone],  and Tetracyclines & related   Social History   Tobacco Use  . Smoking status: Former Smoker    Packs/day: 0.80    Years: 15.00    Pack years: 12.00    Types: Cigarettes    Start date: 02/13/1989    Quit date: 04/28/2004    Years since quitting: 15.1  . Smokeless tobacco: Never Used  Substance Use Topics  . Alcohol use: No    Alcohol/week: 0.0 standard drinks  . Drug use: No     Family Hx: The patient's family history includes Coronary artery disease in an other family member; Dementia in her maternal aunt and mother; Hypertension in an other family member.  ROS:   Please see the history of present illness.     All other systems reviewed and are negative.   Prior CV studies:   The following studies were reviewed today:  05/2009 Nuclear stress No ischemia  03/2014 Echo Study Conclusions  - Left ventricle: The cavity size was normal. Systolic function was normal. Wall motion was normal; there were no regional wall motion abnormalities.  03/2014 Lexiscan  MPI IMPRESSION: 1. No reversible ischemia or infarction.  2. Normal left ventricular wall motion.  3. Left ventricular ejection fraction 78%  4. Low-risk stress test findings*.  03/2014 Event Monitor Symptoms correlate with NSR. One episode of fluttering showed 2 PACs. No significant arrhythmias  07/2015 Lexiscan MPI  There was no ST segment deviation noted during stress.  This is a low risk study.  The left ventricular ejection fraction is mildly decreased (45-54%). LVEF is 49% just slightly below normal, visually appears normal. Consider correlating with echo.  There is a small mild intensity distal anterior and apical defect with mild reversibility. The apex has normal wall motion. Finding is likely secondary to apical thinning, there is also significant radiotracer uptake in the adjacent gut which may create some artifact. Cannot rule would mild apical ischemia. Overall findings are low  risk.    11/2016 holter  Min HR 42, Max HR 94, Avg HR 57  No supraventricular or ventricular ectopy  Telemetry tracings show sinus bradycardia and sinus rhythm  No symptoms reported  No significant arrhythmias   Labs/Other Tests and Data Reviewed:    EKG:  No ECG reviewed.  Recent Labs: 05/31/2019: ALT 11; BUN 14; Creatinine, Ser 1.17; Hemoglobin 13.4; Platelets 362; Potassium 3.1; Sodium 141   Recent Lipid Panel Lab Results  Component Value Date/Time   CHOL 193 12/19/2015 11:42 AM   TRIG 97 12/19/2015 11:42 AM   HDL 54 12/19/2015 11:42 AM   CHOLHDL 3.6 12/19/2015 11:42 AM   CHOLHDL 3.3 04/03/2014 05:18 AM   LDLCALC 120 (H) 12/19/2015 11:42 AM    Wt Readings from Last 3 Encounters:  05/31/19 192 lb (87.1 kg)  04/28/19 197 lb 12.8 oz (89.7 kg)  04/20/19 202 lb (91.6 kg)     Objective:    Vital Signs:  LMP 08/25/2012    Today's Vitals   06/03/19 0851  BP: 106/72  Pulse: 70  Weight: 189 lb (85.7 kg)  Height: 5' 3.5" (1.613 m)   Body mass index is 32.95 kg/m. Normal affect. Normal speech pattern and tone. Comfortable, no apparent distress. No audible signs of SOB or wheezing.   ASSESSMENT & PLAN:    1.Chest pain - recent symptoms not consistent with cardiac etiology, no further cardiac testing planned at this time  2. Palpitations - overall controlled, continue current meds   3. HTN - some recent low bp's after her recent cholecystecomy  - lower chlorthalidone to 12.5mg  daily.   COVID-19 Education: The signs and symptoms of COVID-19 were discussed with the patient and how to seek care for testing (follow up with PCP or arrange E-visit).  The importance of social distancing was discussed today.  Time:   Today, I have spent 24 minutes with the patient with telehealth technology discussing the above problems.     Medication Adjustments/Labs and Tests Ordered: Current medicines are reviewed at length with the patient today.  Concerns regarding  medicines are outlined above.   Tests Ordered: No orders of the defined types were placed in this encounter.   Medication Changes: No orders of the defined types were placed in this encounter.   Follow Up:  Either In Person or Virtual in 6 month(s)  Signed, Carlyle Dolly, MD  06/03/2019 8:08 AM    [place]

## 2019-06-03 NOTE — Patient Instructions (Signed)
Medication Instructions:  DECREASE CHLORTHALIDONE TO 12.5 MG DAILY   Labwork: NONE  Testing/Procedures: NONE  Follow-Up: Your physician wants you to follow-up in: 6 MONTHS.  You will receive a reminder letter in the mail two months in advance. If you don't receive a letter, please call our office to schedule the follow-up appointment.   Any Other Special Instructions Will Be Listed Below (If Applicable).     If you need a refill on your cardiac medications before your next appointment, please call your pharmacy.

## 2019-06-07 ENCOUNTER — Other Ambulatory Visit: Payer: Self-pay

## 2019-06-07 ENCOUNTER — Ambulatory Visit (INDEPENDENT_AMBULATORY_CARE_PROVIDER_SITE_OTHER): Payer: BC Managed Care – PPO | Admitting: Internal Medicine

## 2019-06-07 ENCOUNTER — Encounter (INDEPENDENT_AMBULATORY_CARE_PROVIDER_SITE_OTHER): Payer: Self-pay | Admitting: Internal Medicine

## 2019-06-07 VITALS — BP 110/72 | HR 67 | Temp 97.3°F | Ht 63.5 in | Wt 195.8 lb

## 2019-06-07 DIAGNOSIS — K219 Gastro-esophageal reflux disease without esophagitis: Secondary | ICD-10-CM

## 2019-06-07 NOTE — Patient Instructions (Signed)
Consider changing Pantoprazole dose to every other day after two or three months. Notify if you have another episode of chest or abdominal pain.

## 2019-06-07 NOTE — Progress Notes (Signed)
Presenting complaint;  Recent episode of epigastric and chest pain. Follow-up for GERD.  Database and subjective:  Patient is 59 year old female who was seen in November last year for symptoms of epigastric pain, GERD and dysphagia.  She also has history of gallbladder polyps and fatty liver but her LFTs have remained normal.  She underwent abdominal ultrasound on 03/17/2019 she had esophagogastroduodenoscopy on 04/14/2019 revealing grade A reflux esophagitis, small sliding hiatal hernia and erosive antral gastritis.  Biopsy revealed focal ulceration but stains were negative for H. pylori.  She was maintained on pantoprazole. Patient noted improvement in her dysphagia.  She was referred to Dr. Arnoldo Morale because of gallbladder polyps which have been increasing in size. She underwent laparoscopic cholecystectomy on 04/28/2019.  She had cholesterol polyps but no evidence of acute or chronic cholecystitis.  Dr. Arnoldo Morale noted her liver to be normal. Patient states that she has been doing well and gradually advancing her diet and not experiencing diarrhea.  However she woke up at 2 AM on 05/31/2019 with midepigastric pain.  She says when she woke up to void she noted this pain and was mild.  Later on pain started to migrate into her chest/retrosternal area.  She did not experience shortness of breath or diaphoreses.  However couple hours later pain radiated into her back and left elbow.  She noted her heart rate was rapid and blood pressure was elevated.  She she therefore became concerned that she was having anginal pain.  Therefore she called 911.  Patient was brought to emergency room for evaluation.  She had chest CT angio and was negative for embolism or other abnormalities.  CBC comprehensive chemistry panel was normal except serum potassium of 3.1 and serum creatinine of 1.17.  She says she has chronic kidney disease. Her troponin level was mildly elevated and was checked 3 times and the third 1 was normal.   Her D-dimer was also mildly elevated. Patient pain was felt to be noncardiac.  Since then she has done virtual visit with Dr. Carlyle Dolly on 06/03/2019.  He noted she has a history of chest pain going back to 2015.  He recommended better control for blood pressure and monitoring for recurrent episodes.  Patient states she has not had any more episode of chest or epigastric pain.  She has heartburn maybe once every 2 weeks.  It is mild.  She denies dysphagia.  She is watching her diet.  She does not eat meat.  She has lost 12 pounds in the last 10 weeks. She remains with constipation.  She is taking milk of magnesia twice a week.  She did take MiraLAX only once but then stopped it.  She is gradually increasing fiber in her diet.  She says she eats oatmeal and vegetables every day.  She is also trying to eat apples daily.  She denies melena or rectal bleeding.  She does not take OTC NSAIDs. She is up-to-date on screening for CRC.  Last colonoscopy was in July 2020.  No polyps were found but she had pancolonic diverticulosis and external hemorrhoids.   Current Medications: Outpatient Encounter Medications as of 06/07/2019  Medication Sig  . acetaminophen (TYLENOL) 650 MG CR tablet Take 650 mg by mouth 2 (two) times daily as needed for pain.  [name] aspirin EC 81 MG tablet Take 81 mg by mouth daily at 12 noon.   . chlorthalidone (HYGROTON) 25 MG tablet Take 0.5 tablets (12.5 mg total) by mouth daily at 12 noon.  [name]  Cholecalciferol (VITAMIN D) 50 MCG (2000 UT) CAPS Take 2,000 Units by mouth daily.  [name] diltiazem (CARDIZEM CD) 120 MG 24 hr capsule Take 120 mg by mouth at bedtime.   [name] KLOR-CON M20 20 MEQ tablet Take 20 mEq by mouth at bedtime.   [name] loratadine (CLARITIN) 10 MG tablet Take 10 mg by mouth daily at 12 noon.   . Menthol-Methyl Salicylate (SALONPAS PAIN RELIEF PATCH EX) Apply 2-3 patches topically daily as needed (back / shoulder pain).  . metoprolol tartrate (LOPRESSOR) 50 MG tablet Take 1 tablet (50  mg total) by mouth 2 (two) times daily.  . pantoprazole (PROTONIX) 40 MG tablet Take 1 tablet (40 mg total) by mouth daily.  . rosuvastatin (CRESTOR) 10 MG tablet Take 10 mg by mouth every other day. At 12 noon  . albuterol (PROAIR HFA) 108 (90 Base) MCG/ACT inhaler Inhale 1-2 puffs into the lungs every 4 (four) hours as needed for wheezing or shortness of breath.   . [DISCONTINUED] Menthol, Topical Analgesic, (ICY HOT BACK EX) Apply 1 application topically daily as needed (for pain).   No facility-administered encounter medications on file as of 06/07/2019.     Objective: Blood pressure 110/72, pulse 67, temperature (!) 97.3 F (36.3 C), temperature source Temporal, height 5' 3.5" (1.613 m), weight 195 lb 12.8 oz (88.8 kg), last menstrual period 08/25/2012. Patient is alert and in no acute distress. Patient is wearing a facial mask. Conjunctiva is pink. Sclera is nonicteric Oropharyngeal mucosa is normal. No neck masses or thyromegaly noted. Cardiac exam with regular rhythm normal S1 and S2. No murmur or gallop noted. Lungs are clear to auscultation. Abdomen is symmetrical.  She has well-healed laparoscopy scars in upper abdomen.  Bowel sounds are normal.  On palpation abdomen is soft and nontender with organomegaly or masses. No LE edema or clubbing noted.  Labs/studies Results:  CBC Latest Ref Rng & Units 05/31/2019 03/10/2019 11/04/2018  WBC 4.0 - 10.5 K/uL 8.8 9.3 9.2  Hemoglobin 12.0 - 15.0 g/dL 13.4 12.9 12.4  Hematocrit 36.0 - 46.0 % 40.5 39.4 37.7  Platelets 150 - 400 K/uL 362 374 323    CMP Latest Ref Rng & Units 05/31/2019 03/10/2019 11/04/2018  Glucose 70 - 99 mg/dL 97 89 -  BUN 6 - 20 mg/dL _0 Creatinine 0.44 - 1.00 mg/dL 1.17(H) 1.26(H) 1.08(H)  Sodium 135 - 145 mmol/L 141 142 -  Potassium 3.5 - 5.1 mmol/L 3.1(L) 4.3 -  Chloride 98 - 111 mmol/L 101 102 -  CO2 22 - 32 mmol/L 29 30 -  Calcium 8.9 - 10.3 mg/dL 10.2 10.5(H) -  Total Protein 6.5 - 8.1 g/dL 7.6 7.3 -   Total Bilirubin 0.3 - 1.2 mg/dL 0.9 0.5 -  Alkaline Phos 38 - 126 U/L 53 - -  AST 15 - 41 U/L 18 15 -  ALT 0 - 44 U/L 11 10 -    Hepatic Function Latest Ref Rng & Units 05/31/2019 03/10/2019 05/20/2018  Total Protein 6.5 - 8.1 g/dL 7.6 7.3 7.6  Albumin 3.5 - 5.0 g/dL 4.0 - 3.7  AST 15 - 41 U/L _1 ALT 0 - 44 U/L _2 Alk Phosphatase 38 - 126 U/L 53 - 49  Total Bilirubin 0.3 - 1.2 mg/dL 0.9 0.5 1.2  Bilirubin, Direct 0.1 - 0.5 mg/dL - - -     Assessment:  #1.  GERD.  She had EGD in mid December 2020 revealing grade a reflux  esophagitis with small sliding hiatal hernia.  She is doing well with dietary measures and a PPI.  PPI dose will be decreased in future.  I do not believe she is having any side effects.  She does have underlying chronic kidney disease possibly related to hypertension.  #2.  Recent ER visit for epigastric and chest pain.  Extensive evaluation in emergency room was negative including normal LFTs and serum lipase.  Given history of dysphagia 1 has to wonder if chest pain was due to esophageal spasm.  She has been evaluated by Dr. Carlyle Dolly who felt pain was noncardiac.   Plan:  Patient will continue pantoprazole at the current dose of 40 mg by mouth every morning but after 2 months she will drop it to every other day and she could take famotidine OTC 20 mg on off to his for breakthrough heartburn. Patient will call office if epigastric or chest pain recurs. Office visit in 6 months.

## 2019-06-14 DIAGNOSIS — Z1231 Encounter for screening mammogram for malignant neoplasm of breast: Secondary | ICD-10-CM | POA: Diagnosis not present

## 2019-08-06 DIAGNOSIS — E782 Mixed hyperlipidemia: Secondary | ICD-10-CM | POA: Diagnosis not present

## 2019-08-06 DIAGNOSIS — N183 Chronic kidney disease, stage 3 unspecified: Secondary | ICD-10-CM | POA: Diagnosis not present

## 2019-08-06 DIAGNOSIS — I10 Essential (primary) hypertension: Secondary | ICD-10-CM | POA: Diagnosis not present

## 2019-08-06 DIAGNOSIS — R944 Abnormal results of kidney function studies: Secondary | ICD-10-CM | POA: Diagnosis not present

## 2019-08-09 DIAGNOSIS — J4520 Mild intermittent asthma, uncomplicated: Secondary | ICD-10-CM | POA: Diagnosis not present

## 2019-08-09 DIAGNOSIS — Z1331 Encounter for screening for depression: Secondary | ICD-10-CM | POA: Diagnosis not present

## 2019-08-09 DIAGNOSIS — Z1389 Encounter for screening for other disorder: Secondary | ICD-10-CM | POA: Diagnosis not present

## 2019-08-09 DIAGNOSIS — I671 Cerebral aneurysm, nonruptured: Secondary | ICD-10-CM | POA: Diagnosis not present

## 2019-08-09 DIAGNOSIS — I10 Essential (primary) hypertension: Secondary | ICD-10-CM | POA: Diagnosis not present

## 2019-08-09 DIAGNOSIS — I700 Atherosclerosis of aorta: Secondary | ICD-10-CM | POA: Diagnosis not present

## 2019-08-24 DIAGNOSIS — M79671 Pain in right foot: Secondary | ICD-10-CM | POA: Diagnosis not present

## 2019-08-24 DIAGNOSIS — M722 Plantar fascial fibromatosis: Secondary | ICD-10-CM | POA: Diagnosis not present

## 2019-08-26 DIAGNOSIS — H43811 Vitreous degeneration, right eye: Secondary | ICD-10-CM | POA: Diagnosis not present

## 2019-08-27 DIAGNOSIS — H2513 Age-related nuclear cataract, bilateral: Secondary | ICD-10-CM | POA: Diagnosis not present

## 2019-08-27 DIAGNOSIS — H43813 Vitreous degeneration, bilateral: Secondary | ICD-10-CM | POA: Diagnosis not present

## 2019-08-27 DIAGNOSIS — H35033 Hypertensive retinopathy, bilateral: Secondary | ICD-10-CM | POA: Diagnosis not present

## 2019-08-27 DIAGNOSIS — H35373 Puckering of macula, bilateral: Secondary | ICD-10-CM | POA: Diagnosis not present

## 2019-10-11 DIAGNOSIS — H35373 Puckering of macula, bilateral: Secondary | ICD-10-CM | POA: Diagnosis not present

## 2019-10-11 DIAGNOSIS — H43813 Vitreous degeneration, bilateral: Secondary | ICD-10-CM | POA: Diagnosis not present

## 2019-10-11 DIAGNOSIS — H2513 Age-related nuclear cataract, bilateral: Secondary | ICD-10-CM | POA: Diagnosis not present

## 2019-10-11 DIAGNOSIS — H35033 Hypertensive retinopathy, bilateral: Secondary | ICD-10-CM | POA: Diagnosis not present

## 2019-10-28 ENCOUNTER — Other Ambulatory Visit (INDEPENDENT_AMBULATORY_CARE_PROVIDER_SITE_OTHER): Payer: Self-pay | Admitting: Nurse Practitioner

## 2019-10-28 DIAGNOSIS — R1319 Other dysphagia: Secondary | ICD-10-CM

## 2019-10-28 NOTE — Telephone Encounter (Signed)
HI Tammy pls have Thayer Headings or Dr. Laural Golden review RX request thx

## 2019-11-10 ENCOUNTER — Emergency Department (HOSPITAL_COMMUNITY): Payer: BC Managed Care – PPO

## 2019-11-10 ENCOUNTER — Emergency Department (HOSPITAL_COMMUNITY)
Admission: EM | Admit: 2019-11-10 | Discharge: 2019-11-10 | Disposition: A | Discharge status: Home / Self Care | Payer: BC Managed Care – PPO | Attending: Emergency Medicine | Admitting: Emergency Medicine

## 2019-11-10 ENCOUNTER — Other Ambulatory Visit: Payer: Self-pay

## 2019-11-10 ENCOUNTER — Encounter (HOSPITAL_COMMUNITY): Payer: Self-pay | Admitting: Emergency Medicine

## 2019-11-10 DIAGNOSIS — I129 Hypertensive chronic kidney disease with stage 1 through stage 4 chronic kidney disease, or unspecified chronic kidney disease: Secondary | ICD-10-CM | POA: Diagnosis not present

## 2019-11-10 DIAGNOSIS — Z87891 Personal history of nicotine dependence: Secondary | ICD-10-CM | POA: Insufficient documentation

## 2019-11-10 DIAGNOSIS — M549 Dorsalgia, unspecified: Secondary | ICD-10-CM

## 2019-11-10 DIAGNOSIS — N189 Chronic kidney disease, unspecified: Secondary | ICD-10-CM | POA: Diagnosis not present

## 2019-11-10 DIAGNOSIS — R21 Rash and other nonspecific skin eruption: Secondary | ICD-10-CM | POA: Diagnosis not present

## 2019-11-10 DIAGNOSIS — R079 Chest pain, unspecified: Secondary | ICD-10-CM | POA: Diagnosis not present

## 2019-11-10 DIAGNOSIS — Z79899 Other long term (current) drug therapy: Secondary | ICD-10-CM | POA: Insufficient documentation

## 2019-11-10 DIAGNOSIS — R0789 Other chest pain: Secondary | ICD-10-CM | POA: Diagnosis not present

## 2019-11-10 DIAGNOSIS — R9431 Abnormal electrocardiogram [ECG] [EKG]: Secondary | ICD-10-CM | POA: Diagnosis not present

## 2019-11-10 DIAGNOSIS — Z6835 Body mass index (BMI) 35.0-35.9, adult: Secondary | ICD-10-CM | POA: Diagnosis not present

## 2019-11-10 DIAGNOSIS — Z7982 Long term (current) use of aspirin: Secondary | ICD-10-CM | POA: Insufficient documentation

## 2019-11-10 LAB — BASIC METABOLIC PANEL
Anion gap: 11 (ref 5–15)
BUN: 17 mg/dL (ref 6–20)
CO2: 25 mmol/L (ref 22–32)
Calcium: 9.9 mg/dL (ref 8.9–10.3)
Chloride: 103 mmol/L (ref 98–111)
Creatinine, Ser: 1.15 mg/dL — ABNORMAL HIGH (ref 0.44–1.00)
GFR calc Af Amer: >60 mL/min (ref >60)
GFR calc non Af Amer: 52 mL/min — ABNORMAL LOW (ref >60)
Glucose, Bld: 93 mg/dL (ref 70–99)
Potassium: 3.2 mmol/L — ABNORMAL LOW (ref 3.5–5.1)
Sodium: 139 mmol/L (ref 135–145)

## 2019-11-10 LAB — HEPATIC FUNCTION PANEL
ALT: 12 U/L (ref 0–44)
AST: 20 U/L (ref 15–41)
Albumin: 4.3 g/dL (ref 3.5–5.0)
Alkaline Phosphatase: 54 U/L (ref 38–126)
Bilirubin, Direct: 0.1 mg/dL (ref 0.0–0.2)
Indirect Bilirubin: 0.5 mg/dL (ref 0.3–0.9)
Total Bilirubin: 0.6 mg/dL (ref 0.3–1.2)
Total Protein: 7.8 g/dL (ref 6.5–8.1)

## 2019-11-10 LAB — D-DIMER, QUANTITATIVE: D-Dimer, Quant: 0.27 ug/mL-FEU (ref 0.00–0.50)

## 2019-11-10 LAB — CBC
HCT: 38.1 % (ref 36.0–46.0)
Hemoglobin: 12.2 g/dL (ref 12.0–15.0)
MCH: 29.1 pg (ref 26.0–34.0)
MCHC: 32 g/dL (ref 30.0–36.0)
MCV: 90.9 fL (ref 80.0–100.0)
Platelets: 355 10*3/uL (ref 150–400)
RBC: 4.19 MIL/uL (ref 3.87–5.11)
RDW: 12.9 % (ref 11.5–15.5)
WBC: 9.1 10*3/uL (ref 4.0–10.5)
nRBC: 0 % (ref 0.0–0.2)

## 2019-11-10 LAB — LIPASE, BLOOD: Lipase: 38 U/L (ref 11–51)

## 2019-11-10 LAB — TROPONIN I (HIGH SENSITIVITY)
Troponin I (High Sensitivity): 3 ng/L (ref <18)
Troponin I (High Sensitivity): 5 ng/L (ref <18)

## 2019-11-10 MED ORDER — POTASSIUM CHLORIDE ER 10 MEQ PO TBCR
20.0000 meq | EXTENDED_RELEASE_TABLET | Freq: Every day | ORAL | 0 refills | Status: DC
Start: 2019-11-10 — End: 2019-12-07

## 2019-11-10 MED ORDER — FAMOTIDINE 20 MG PO TABS
20.0000 mg | ORAL_TABLET | Freq: Every day | ORAL | 0 refills | Status: DC
Start: 2019-11-10 — End: 2020-03-28

## 2019-11-10 MED ORDER — ALUM & MAG HYDROXIDE-SIMETH 200-200-20 MG/5ML PO SUSP
30.0000 mL | Freq: Once | ORAL | Status: AC
  Administered 2019-11-10: 30 mL via ORAL
  Filled 2019-11-10: qty 30

## 2019-11-10 MED ORDER — SUCRALFATE 1 GM/10ML PO SUSP
1.0000 g | Freq: Three times a day (TID) | ORAL | 0 refills | Status: DC
Start: 2019-11-10 — End: 2019-12-07

## 2019-11-10 MED ORDER — LIDOCAINE 5 % EX PTCH: 1.0000 | MEDICATED_PATCH | CUTANEOUS | 0 refills | Status: AC

## 2019-11-10 MED ORDER — LIDOCAINE VISCOUS HCL 2 % MT SOLN
15.0000 mL | Freq: Once | OROMUCOSAL | Status: AC
  Administered 2019-11-10: 15 mL via ORAL
  Filled 2019-11-10: qty 15

## 2019-11-10 MED ORDER — PANTOPRAZOLE SODIUM 40 MG IV SOLR
40.0000 mg | Freq: Once | INTRAVENOUS | Status: AC
  Administered 2019-11-10: 40 mg via INTRAVENOUS
  Filled 2019-11-10: qty 40

## 2019-11-10 NOTE — ED Triage Notes (Signed)
Pt has had chest pain that radiates to her back for the past 2 wks. Chest pressure began today at 1400. Patient c/o pain in the epigastric area as well.

## 2019-11-10 NOTE — Discharge Instructions (Addendum)
At this time there does not appear to be the presence of an emergent medical condition, however there is always the potential for conditions to change. Please read and follow the below instructions.  Please return to the Emergency Department immediately for any new or worsening symptoms. Please be sure to follow up with your Primary Care Provider within one week regarding your visit today; please call their office to schedule an appointment even if you are feeling better for a follow-up visit. You may begin taking the medications Carafate and Pepcid as prescribed to help with acid reflux symptoms.  Please drink plenty water and get plenty of rest.  Please see your primary care provider for follow-up this week. You may use the Lidoderm patch as prescribed to help with your symptoms.  Lidoderm may be expensive so you may speak with your pharmacist about finding over-the-counter medications that work similarly. Your potassium level was slightly low today, please take the potassium supplements as prescribed and have your potassium level rechecked by your primary care provider at your follow-up appointment.  Get help right away if: Your chest pain is worse. You have a cough that gets worse, or you cough up blood. You have very bad (severe) pain in your belly (abdomen). You pass out (faint). You have either of these for no clear reason: Sudden chest discomfort. Sudden discomfort in your arms, back, neck, or jaw. You have shortness of breath at any time. You suddenly start to sweat, or your skin gets clammy. You feel sick to your stomach (nauseous). You throw up (vomit). You suddenly feel lightheaded or dizzy. You feel very weak or tired. You develop new bowel or bladder control problems. You have unusual weakness or numbness in your arms or legs. You develop nausea or vomiting. You develop abdominal pain. You feel faint. Your heart starts to beat fast, or it feels like it is skipping beats. You  have any new/concerning or worsening of symptoms These symptoms may be an emergency. Do not wait to see if the symptoms will go away. Get medical help right away. Call your local emergency services (911 in the [place] Do not drive yourself to the hospital.  Please read the additional information packets attached to your discharge summary.  Do not take your medicine if  develop an itchy rash, swelling in your mouth or lips, or difficulty breathing; call 911 and seek immediate emergency medical attention if this occurs.  You may review your lab tests and imaging results in their entirety on your MyChart account.  Please discuss all results of fully with your primary care provider and other specialist at your follow-up visit.  Note: Portions of this text may have been transcribed using voice recognition software. Every effort was made to ensure accuracy; however, inadvertent computerized transcription errors may still be present.

## 2019-11-10 NOTE — ED Provider Notes (Signed)
[facility] EMERGENCY DEPARTMENT Provider Note   CSN: [id number] Arrival date & time: 11/10/19  1937     History Chief Complaint  Patient presents with  . Chest Pain    [name] [name] is a 59 y.o. female history of cerebral aneurysm, CKD, fibromyalgia, GERD, hypertension, hyperlipidemia, obesity.  Patient presents today from urgent care for concern of ACS.  Patient reports that she has had upper back pain for the last 2 weeks, she reports soreness worsened with movement and palpation improved with rest, pain does not radiate she feels this is musculoskeletal.  She reports that this did not initially concern her but earlier today she developed some chest pain which she reports is epigastric mild nonradiating constant no clear aggravating or alleviating factors.  She reports that this burning chest pain feels similar to her acid reflux symptoms but she went to the urgent care to be evaluated.  She reports they did an EKG and were concerned for her heart and sent her to the emergency department for further evaluation.  She denies fever/chills, fall/injury, neck pain, cough/hemoptysis, vomiting/diarrhea, nominal pain, extremity swelling/color change or any additional concerns.  HPI     Past Medical History:  Diagnosis Date  . Aneurysm ([place])    brain  . Chest pain    denied on 06/18/2012  . Chronic kidney disease    L - upper pole- defect, doesn't effect     . Family history of anesthesia complication    brother & neice "flat lined" during induction: neice d/t a medication given for a blood clotting problem; brother d/t "miscalculated amount of anesthesia"  . Fibromyalgia   . GERD (gastroesophageal reflux disease)    h/o colitis   . H. pylori infection    2012- stomach biopsy  . H/O hiatal hernia   . Headache(784.0)    using tylenol prn  . Hyperlipidemia   . Hypertension   . Hypoglycemia   . Normal cardiac stress test    pt. released fr. Dr. [name] care in 10/2011, all  findings w/i normal limits  . Overweight(278.02)    Obesity  . Palpitations   . Sinusitis, acute    seen by PCP- 06/17/2012, will treat /w augmentin & tussin/DM  . Uterine fibroid    pt. states that she has a "closed cervix"     Patient Active Problem List   Diagnosis Date Noted  . Biliary colic   . Dysphagia 03/10/2019  . Gallbladder polyp 03/10/2019  . Esophageal dysphagia 03/10/2019  . Diverticulitis of colon 06/09/2018  . Acute diverticulitis 05/20/2018  . AKI (acute kidney injury) ([place]) 05/20/2018  . Neck pain 12/02/2016  . Bilateral occipital neuralgia 12/02/2016  . Cervical neuralgia 03/25/2016  . Tension headache 03/25/2016  . Chest tightness or pressure 04/03/2014  . Dizziness 04/03/2014  . Headache 04/03/2014  . Chest pain, non-cardiac 04/02/2014  . GERD (gastroesophageal reflux disease) 04/02/2014  . Meningitis, unspecified(322.9) 08/11/2012  . 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 HLD (hyperlipidemia) 01/05/2009  . OVERWEIGHT/OBESITY 01/05/2009  . Essential hypertension 01/05/2009  . Palpitations 01/05/2009  . CHEST PAIN-UNSPECIFIED 01/05/2009    Past Surgical History:  Procedure Laterality Date  . BIOPSY  04/14/2019   Procedure: BIOPSY;  Surgeon: Rogene Houston, MD;  Location: [facility];  Service: Endoscopy;;  antrum  . CHOLECYSTECTOMY N/A 04/28/2019   Procedure: LAPAROSCOPIC CHOLECYSTECTOMY;  Surgeon: Aviva Signs, MD;  Location: [facility] ORS;  Service: General;  Laterality: N/A;  . COLONOSCOPY    . COLONOSCOPY N/A  11/26/2018   Procedure: COLONOSCOPY;  Surgeon: Rogene Houston, MD;  Location: [facility];  Service: Endoscopy;  Laterality: N/A;  1:00-12:00pm  . CRANIOTOMY Right 06/24/2012   Procedure: CRANIOTOMY INTRACRANIAL ANEURYSM FOR CAROTID;  Surgeon: [name], MD;  Location: [facility];  Service: Neurosurgery;  Laterality: Right;  RIGHT Pterional Craniotomy for aneurysm  . ESOPHAGOGASTRODUODENOSCOPY N/A 04/14/2019   Procedure:  ESOPHAGOGASTRODUODENOSCOPY (EGD) WITH POSSIBLE ESOPHAGEAL DILATION;  Surgeon: Rogene Houston, MD;  Location: [facility];  Service: Endoscopy;  Laterality: N/A;  300pm  . LIPOMA EXCISION     buttock 10/2010     OB History   No obstetric history on file.     Family History  Problem Relation Age of Onset  . Hypertension Other   . Coronary artery disease Other   . Dementia Mother   . Dementia Maternal Aunt     Social History   Tobacco Use  . Smoking status: Former Smoker    Packs/day: 0.80    Years: 15.00    Pack years: 12.00    Types: Cigarettes    Start date: 02/13/1989    Quit date: 04/28/2004    Years since quitting: 15.5  . Smokeless tobacco: Never Used  Vaping Use  . Vaping Use: Never used  Substance Use Topics  . Alcohol use: No    Alcohol/week: 0.0 standard drinks  . Drug use: No    Home Medications Prior to Admission medications   Medication Sig Start Date End Date Taking? Authorizing Provider  acetaminophen (TYLENOL) 650 MG CR tablet Take 650 mg by mouth 2 (two) times daily as needed for pain.    [provider]  albuterol (PROAIR HFA) 108 (90 Base) MCG/ACT inhaler Inhale 1-2 puffs into the lungs every 4 (four) hours as needed for wheezing or shortness of breath.  06/06/15   [provider]  aspirin EC 81 MG tablet Take 81 mg by mouth daily at 12 noon.  04/17/19   Rehman, Mechele Dawley, MD  chlorthalidone (HYGROTON) 25 MG tablet Take 0.5 tablets (12.5 mg total) by mouth daily at 12 noon. 06/03/19   Arnoldo Lenis, MD  Cholecalciferol (VITAMIN D) 50 MCG (2000 UT) CAPS Take 2,000 Units by mouth daily.    [provider]  diltiazem (CARDIZEM CD) 120 MG 24 hr capsule Take 120 mg by mouth at bedtime.  06/09/18   [provider]  KLOR-CON M20 20 MEQ tablet Take 20 mEq by mouth at bedtime.  06/09/18   [provider]  loratadine (CLARITIN) 10 MG tablet Take 10 mg by mouth daily at 12 noon.     [provider]    Menthol-Methyl Salicylate (SALONPAS PAIN RELIEF PATCH EX) Apply 2-3 patches topically daily as needed (back / shoulder pain).    [provider]  metoprolol tartrate (LOPRESSOR) 50 MG tablet Take 1 tablet (50 mg total) by mouth 2 (two) times daily. 08/11/17 06/07/19  Fay Records, MD  pantoprazole (PROTONIX) 40 MG tablet TAKE 1 TABLET BY MOUTH EVERY DAY 10/28/19   Laurine Blazer A, PA-C  rosuvastatin (CRESTOR) 10 MG tablet Take 10 mg by mouth every other day. At 12 noon    [provider]    Allergies    Ace inhibitors, Azithromycin, Ciprofloxacin, Nsaids, Other, Statins, Medrol [methylprednisolone], and Tetracyclines & related  Review of Systems   Review of Systems Ten systems are reviewed and are negative for acute change except as noted in the HPI  Physical  Exam Updated Vital Signs BP (!) 134/55   Pulse 65   Temp 98.7 F (37.1 C) (Oral)   Resp 19   Ht 5\' 3"  (1.6 m)   Wt 88 kg   LMP 08/25/2012   SpO2 97%   BMI 34.37 kg/m   Physical Exam Constitutional:      General: She is not in acute distress.    Appearance: Normal appearance. She is well-developed. She is not ill-appearing or diaphoretic.  HENT:     Head: Normocephalic and atraumatic.  Eyes:     General: Vision grossly intact. Gaze aligned appropriately.     Pupils: Pupils are equal, round, and reactive to light.  Neck:     Trachea: Trachea and phonation normal.  Cardiovascular:     Rate and Rhythm: Normal rate and regular rhythm.     Pulses:          Radial pulses are 2+ on the right side and 2+ on the left side.       Dorsalis pedis pulses are 2+ on the right side and 2+ on the left side.  Pulmonary:     Effort: Pulmonary effort is normal. No respiratory distress.  Chest:     Chest wall: Tenderness present. No deformity or crepitus.  Abdominal:     General: There is no distension.     Palpations: Abdomen is soft.     Tenderness: There is no abdominal tenderness. There is no guarding or rebound.   Musculoskeletal:        General: Normal range of motion.     Cervical back: Normal range of motion.     Right lower leg: No tenderness. No edema.     Left lower leg: No tenderness. No edema.     Comments: No midline C/T/L spinal tenderness to palpation, no deformity, crepitus, or step-off noted. No sign of injury to the neck or back. - Bilateral muscular upper back pain without overlying skin changes.  Skin:    General: Skin is warm and dry.  Neurological:     Mental Status: She is alert.     GCS: GCS eye subscore is 4. GCS verbal subscore is 5. GCS motor subscore is 6.     Comments: Speech is clear and goal oriented, follows commands Major Cranial nerves without deficit, no facial droop Moves extremities without ataxia, coordination intact  Psychiatric:        Behavior: Behavior normal.     ED Results / Procedures / Treatments   Labs (all labs ordered are listed, but only abnormal results are displayed) Labs Reviewed  CBC  BASIC METABOLIC PANEL  TROPONIN I (HIGH SENSITIVITY)    EKG EKG Interpretation  Date/Time:  Wednesday November 10 2019 19:49:18 EDT Ventricular Rate:  76 PR Interval:    QRS Duration: 102 QT Interval:  409 QTC Calculation: 460 R Axis:   14 Text Interpretation: Sinus rhythm Low voltage, precordial leads Abnormal R-wave progression, early transition no significant change since Feb 2021 Confirmed by Sherwood Gambler [phone]) on 11/10/2019 8:19:53 PM   Radiology DG Chest 2 View  Result Date: 11/10/2019 CLINICAL DATA:  59 year old female with chest pain. EXAM: CHEST - 2 VIEW COMPARISON:  Chest radiograph dated 05/31/2019. FINDINGS: The heart size and mediastinal contours are within normal limits. Both lungs are clear. The visualized skeletal structures are unremarkable. IMPRESSION: No active cardiopulmonary disease. Electronically Signed   By: Anner Crete M.D.   On: 11/10/2019 20:46    Procedures Procedures (including critical care  time)  Medications  Ordered in ED Medications - No data to display  ED Course  I have reviewed the triage vital signs and the nursing notes.  Pertinent labs & imaging results that were available during my care of the patient were reviewed by me and considered in my medical decision making (see chart for details).    MDM Rules/Calculators/A&P                          Additional History Obtained: 1. Nursing notes from this visit. 2. EMR reviewed, reviewed urgent care visit from today.  Patient diagnosed with burning chest pain, chest wall pain and abnormal EKG.  I reviewed their assessment and plan, it appears they suspected musculoskeletal discomfort but since EKG was abnormal she was referred to the emergency room for evaluation of acute coronary syndrome.  I cannot review their EKG through EMR. --------------------- 59 year old female arrives well-appearing in no acute distress she reports musculoskeletal upper back pain for the past 2 weeks and some epigastric abdominal pain which she feels is related to her GERD that began today.  Cranial nerves intact, no meningeal signs, cardiopulmonary exam unremarkable, abdomen soft nontender without peritoneal signs, some mild chest wall tenderness to palpation without overlying skin changes, she has reproducible muscular tenderness of the upper back as well without overlying skin changes, no evidence of osseous injury.  She is neurovascular tact to all 4 extremities without evidence of DVT.  I have ordered chest pain work-up with addition a D-dimer as well as abdominal labs. - EKG: Sinus rhythm Low voltage, precordial leads Abnormal R-wave progression, early transition no significant change since Feb 2021 Confirmed by Sherwood Gambler [phone]) on 11/10/2019 8:19:53 PM  I ordered, reviewed and interpreted labs which include: CBC within normal limits, no leukocytosis to suggest infection and no evidence of anemia. BMP shows mild hypokalemia of 3.2, creatinine appears baseline,  no emergent electrolyte derangement, AKI or gap. Initial and delta high-sensitivity troponins within normal limits without acute elevations. Lipase within normal limits, doubt pancreatitis. LFTs within normal limits, reassuring. D-dimer within normal limits, doubt pulmonary embolism.  CXR:  IMPRESSION:  No active cardiopulmonary disease.  I personally reviewed patient's chest x-ray and agree with radiologist interpretation above. ---- Patient was treated with Protonix and GI cocktail.  Patient reports that shortly after receiving GI cocktail her symptoms completely resolved.  She reports that she is feeling well and having no pain.  On reassessment she is well-appearing in no acute distress and requesting discharge.  Vital signs stable, patient is symptom-free.  Cardiopulmonary examination within normal limits, abdomen soft nontender without peritoneal signs.  Suspect patient's symptoms may be due to reflux disease today, additionally suspect back pain x2 weeks is musculoskeletal in nature. Heart Score <4. Doubt ACS, dissection, PE, pneumonia or other emergent cardiopulmonary etiology of her symptoms today.  Additionally with soft nontender abdomen and tolerating p.o. doubt perforation, SBO or other acute intra-abdominal or pelvic etiologies at this time.  Patient does take Protonix 40 mg twice daily we will add Carafate/Pepcid to patient's pain medications.  Additionally will have patient take potassium supplements for the next 3 days and have her blood work rechecked with her PCP.  Lidoderm prescribed for muscular pain of the back.  At this time there does not appear to be any evidence of an acute emergency medical condition and the patient appears stable for discharge with appropriate outpatient follow up. Diagnosis was discussed with patient who verbalizes  understanding of care plan and is agreeable to discharge. I have discussed return precautions with patient who verbalizes understanding. Patient  encouraged to follow-up with their PCP. All questions answered.  Patient's case discussed with Dr. Regenia Skeeter who agrees with plan to discharge with follow-up.   Note: Portions of this report may have been transcribed using voice recognition software. Every effort was made to ensure accuracy; however, inadvertent computerized transcription errors may still be present. Final Clinical Impression(s) / ED Diagnoses Final diagnoses:  Atypical chest pain  Musculoskeletal back pain    Rx / DC Orders ED Discharge Orders         Ordered    potassium chloride (KLOR-CON) 10 MEQ tablet  Daily     Discontinue  Reprint     11/10/19 2343    famotidine (PEPCID) 20 MG tablet  Daily     Discontinue  Reprint     11/10/19 2343    sucralfate (CARAFATE) 1 GM/10ML suspension  3 times daily with meals & bedtime     Discontinue  Reprint     11/10/19 2343    lidocaine (LIDODERM) 5 %  Every 24 hours     Discontinue  Reprint     11/10/19 2344           Deliah Boston, PA-C 11/10/19 2345    Sherwood Gambler, MD 11/11/19 1736

## 2019-11-23 ENCOUNTER — Ambulatory Visit: Payer: BC Managed Care – PPO | Admitting: Cardiology

## 2019-11-23 ENCOUNTER — Encounter: Payer: Self-pay | Admitting: Cardiology

## 2019-11-23 VITALS — BP 131/85 | HR 57 | Ht 63.0 in | Wt 198.0 lb

## 2019-11-23 DIAGNOSIS — R002 Palpitations: Secondary | ICD-10-CM | POA: Diagnosis not present

## 2019-11-23 DIAGNOSIS — R0789 Other chest pain: Secondary | ICD-10-CM

## 2019-11-23 DIAGNOSIS — I10 Essential (primary) hypertension: Secondary | ICD-10-CM

## 2019-11-23 NOTE — Patient Instructions (Addendum)

## 2019-11-23 NOTE — Progress Notes (Signed)
Clinical Summary [name] [name] is a 59 y.o.female seen today for follow up of the following medical problems.    1. Palpitations - admit 03/2014 with palpitations and chest pain. On that day was hypertensive and tachycardic on admission. - MPI without evidence of ischemia, LVEF 78%. Echo "normal LVEF". TSH 1.4. Event monitor with occasional PACs, no significant arrhythmias - holter 11/2016 no arrhythmias  - initially dilt stopped after recent admission with diverticulitis and hypotension. Since discharge bp's have increased, restarte back on 120mg  daily   - no recent palpitations.     2. HTN - hydralazinepreviously stopped due to concerns for possible drug induced lupus. Symptoms better. - her dilt was prevoiusly decreased, with this significant elvation in bp's and she went back to takeing 180mg  bid - has been on fairly high dose dual av nodal agents due to history of chronic palpitations, has tolerated regimen.   - diltiazem stopped during Jan 2020 admission with acute diverticulitis due to low bp's. Had also had some prior low heart rates. Chlothalidone stopped as well - pcp restarted chlorthalidione and diltiazem forrecurrenthigh bp's  - she is compliant with med   3. Diverticulitis - admitted Jan 2020 with abdominal pain and diarrhea, CT with evidecne of possible diverticulitis - treated with abx  4. Chest pain - long history of atypical chest pain - she completed a lexiscan12/2015that was overall low risk, small apical defect probable artifact. - seen at [facility] 10/17 with chest pain. CT PE negative.  07/2015 nuclear probable apical thinning, cannot exlcude mild apical ischemia - seen in ER 08/2017 with chest pain, worst with movement and cough. No evidence of ischemia by EKG or enzymes   05/31/2019 ER visit with epigastric/lower chest pain - from ER notes tender to palpation WBC 8.8 Hgb 13.4 K 3.1 Cr 1.17 hstrop 31-->26-->15 Ddimer  0.58  EKG SR no acute ishcemic changes - CT PE: no PE, no acute process - CT A/P no acute process  - recently had gallbladder removed , pain started about 1 day after - pressure epigastric and midchest, below sternum. Constant pain more than 10 hours. +tachycardia, elevated blood pressures. Pain in midback.  - has had issues with constipiation after surgery.    11/10/19 seen in ER with back pain, some radiation into chest - from ER notes symptoms resolved with GI cocktail - trop neg x 2, Ddimer neg. EKG SR, no ischeimc changes  -chronic stable symptoms.    5. Hypokalemia - K was 3.2 during recent ER visit - started on KCl 58mEq daily.    5. Recent cholecystetcomy   Past Medical History:  Diagnosis Date  . 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 Chest pain    denied on 06/18/2012  . Chronic kidney disease    L - upper pole- defect, doesn't effect     . Family history of anesthesia complication    brother & neice "flat lined" during induction: neice d/t a medication given for a blood clotting problem; brother d/t "miscalculated amount of anesthesia"  . Fibromyalgia   . GERD (gastroesophageal reflux disease)    h/o colitis   . H. pylori infection    2012- stomach biopsy  . H/O hiatal hernia   . Headache(784.0)    using tylenol prn  . Hyperlipidemia   . Hypertension   . Hypoglycemia   . Normal cardiac stress test    pt. released fr. Dr. [name] care in 10/2011, all findings w/i normal limits  .  Overweight(278.02)    Obesity  . Palpitations   . Sinusitis, acute    seen by PCP- 06/17/2012, will treat /w augmentin & tussin/DM  . Uterine fibroid    pt. states that she has a "closed cervix"      Allergies  Allergen Reactions  . Ace Inhibitors Swelling  . Azithromycin Anaphylaxis, Shortness Of Breath and Swelling  . Ciprofloxacin Nausea And Vomiting    Acute kidney failure    . Nsaids     Kidney disease  . Other Swelling, Other (See Comments) and Cough    Sneezing  allergy to cats/grass/dust  . Statins Other (See Comments)    Cramping, muscle pain. Tolerates Crestor   . Medrol [Methylprednisolone] Palpitations    Heart racing, increased BP  . Tetracyclines & Related Palpitations    Heart racing, increased bp     Current Outpatient Medications  Medication Sig Dispense Refill  . acetaminophen (TYLENOL) 650 MG CR tablet Take 650 mg by mouth 2 (two) times daily as needed for pain.    [name] albuterol (PROAIR HFA) 108 (90 Base) MCG/ACT inhaler Inhale 1-2 puffs into the lungs every 4 (four) hours as needed for wheezing or shortness of breath.     [name] aspirin EC 81 MG tablet Take 81 mg by mouth daily at 12 noon.     . chlorthalidone (HYGROTON) 25 MG tablet Take 0.5 tablets (12.5 mg total) by mouth daily at 12 noon. 45 tablet 3  . Cholecalciferol (VITAMIN D) 50 MCG (2000 UT) CAPS Take 2,000 Units by mouth daily.    [name] diltiazem (CARDIZEM CD) 120 MG 24 hr capsule Take 120 mg by mouth at bedtime.     . famotidine (PEPCID) 20 MG tablet Take 1 tablet (20 mg total) by mouth daily. 20 tablet 0  . KLOR-CON M20 20 MEQ tablet Take 20 mEq by mouth at bedtime.     . lidocaine (LIDODERM) 5 % Place 1 patch onto the skin daily for 15 days. Remove & Discard patch within 12 hours or as directed by MD 15 patch 0  . loratadine (CLARITIN) 10 MG tablet Take 10 mg by mouth daily at 12 noon.     . Menthol-Methyl Salicylate (SALONPAS PAIN RELIEF PATCH EX) Apply 2-3 patches topically daily as needed (back / shoulder pain).    . metoprolol tartrate (LOPRESSOR) 50 MG tablet Take 1 tablet (50 mg total) by mouth 2 (two) times daily. 180 tablet 3  . pantoprazole (PROTONIX) 40 MG tablet TAKE 1 TABLET BY MOUTH EVERY DAY 90 tablet 1  . potassium chloride (KLOR-CON) 10 MEQ tablet Take 2 tablets (20 mEq total) by mouth daily. 6 tablet 0  . rosuvastatin (CRESTOR) 10 MG tablet Take 10 mg by mouth every other day. At 12 noon    . sucralfate (CARAFATE) 1 GM/10ML suspension Take 10 mLs (1 g total) by  mouth 4 (four) times daily -  with meals and at bedtime. 420 mL 0   No current facility-administered medications for this visit.     Past Surgical History:  Procedure Laterality Date  . BIOPSY  04/14/2019   Procedure: BIOPSY;  Surgeon: Rogene Houston, MD;  Location: [facility];  Service: Endoscopy;;  antrum  . CHOLECYSTECTOMY N/A 04/28/2019   Procedure: LAPAROSCOPIC CHOLECYSTECTOMY;  Surgeon: Aviva Signs, MD;  Location: [facility] ORS;  Service: General;  Laterality: N/A;  . COLONOSCOPY    . COLONOSCOPY N/A 11/26/2018   Procedure: COLONOSCOPY;  Surgeon: Rogene Houston, MD;  Location: [facility]  ENDO SUITE;  Service: Endoscopy;  Laterality: N/A;  1:00-12:00pm  . CRANIOTOMY Right 06/24/2012   Procedure: CRANIOTOMY INTRACRANIAL ANEURYSM FOR CAROTID;  Surgeon: [name], MD;  Location: [place] [facility]O ORS;  Service: Neurosurgery;  Laterality: Right;  RIGHT Pterional Craniotomy for aneurysm  . ESOPHAGOGASTRODUODENOSCOPY N/A 04/14/2019   Procedure: ESOPHAGOGASTRODUODENOSCOPY (EGD) WITH POSSIBLE ESOPHAGEAL DILATION;  Surgeon: Rogene Houston, MD;  Location: [facility];  Service: Endoscopy;  Laterality: N/A;  300pm  . LIPOMA EXCISION     buttock 10/2010     Allergies  Allergen Reactions  . Ace Inhibitors Swelling  . Azithromycin Anaphylaxis, Shortness Of Breath and Swelling  . Ciprofloxacin Nausea And Vomiting    Acute kidney failure    . Nsaids     Kidney disease  . Other Swelling, Other (See Comments) and Cough    Sneezing allergy to cats/grass/dust  . Statins Other (See Comments)    Cramping, muscle pain. Tolerates Crestor   . Medrol [Methylprednisolone] Palpitations    Heart racing, increased BP  . Tetracyclines & Related Palpitations    Heart racing, increased bp      Family History  Problem Relation Age of Onset  . Hypertension Other   . Coronary artery disease Other   . Dementia Mother   . Dementia Maternal Aunt      Social History [name] [name] reports that she quit  smoking about 15 years ago. Her smoking use included cigarettes. She started smoking about 30 years ago. She has a 12.00 pack-year smoking history. She has never used smokeless tobacco. [name] [name] reports no history of alcohol use.   Review of Systems CONSTITUTIONAL: No weight loss, fever, chills, weakness or fatigue.  HEENT: Eyes: No visual loss, blurred vision, double vision or yellow sclerae.No hearing loss, sneezing, congestion, runny nose or sore throat.  SKIN: No rash or itching.  CARDIOVASCULAR: per hpi RESPIRATORY: No shortness of breath, cough or sputum.  GASTROINTESTINAL: No anorexia, nausea, vomiting or diarrhea. No abdominal pain or blood.  GENITOURINARY: No burning on urination, no polyuria NEUROLOGICAL: No headache, dizziness, syncope, paralysis, ataxia, numbness or tingling in the extremities. No change in bowel or bladder control.  MUSCULOSKELETAL: No muscle, back pain, joint pain or stiffness.  LYMPHATICS: No enlarged nodes. No history of splenectomy.  PSYCHIATRIC: No history of depression or anxiety.  ENDOCRINOLOGIC: No reports of sweating, cold or heat intolerance. No polyuria or polydipsia.  [name]   Physical Examination Today's Vitals   11/23/19 0846  BP: (!) 131/85  Pulse: 57  SpO2: 95%  Weight: 198 lb (89.8 kg)  Height: 5\' 3"  (1.6 m)   Body mass index is 35.07 kg/m.  Gen: resting comfortably, no acute distress HEENT: no scleral icterus, pupils equal round and reactive, no palptable cervical adenopathy,  CV: RRR, no m/r/g, no jvd Resp: Clear to auscultation bilaterally GI: abdomen is soft, non-tender, non-distended, normal bowel sounds, no hepatosplenomegaly MSK: extremities are warm, no edema.  Skin: warm, no rash Neuro:  no focal deficits Psych: appropriate affect   Diagnostic Studies 05/2009 Nuclear stress No ischemia  03/2014 Echo Study Conclusions  - Left ventricle: The cavity size was normal. Systolic function was normal. Wall motion was  normal; there were no regional wall motion abnormalities.  03/2014 Lexiscan MPI IMPRESSION: 1. No reversible ischemia or infarction.  2. Normal left ventricular wall motion.  3. Left ventricular ejection fraction 78%  4. Low-risk stress test findings*.  03/2014 Event Monitor Symptoms correlate with NSR. One episode of fluttering showed  2 PACs. No significant arrhythmias  07/2015 Lexiscan MPI  There was no ST segment deviation noted during stress.  This is a low risk study.  The left ventricular ejection fraction is mildly decreased (45-54%). LVEF is 49% just slightly below normal, visually appears normal. Consider correlating with echo.  There is a small mild intensity distal anterior and apical defect with mild reversibility. The apex has normal wall motion. Finding is likely secondary to apical thinning, there is also significant radiotracer uptake in the adjacent gut which may create some artifact. Cannot rule would mild apical ischemia. Overall findings are low risk.    11/2016 holter  Min HR 42, Max HR 94, Avg HR 57  No supraventricular or ventricular ectopy  Telemetry tracings show sinus bradycardia and sinus rhythm  No symptoms reported  No significant arrhythmias    Assessment and Plan  1.Chest pain - long history of symptoms with negative prior cardiac workups - continue to monitor  2. Palpitations - doing well, continue currentmeds   3. HTN - at goal, continue current meds      Arnoldo Lenis, M.D.

## 2019-12-02 ENCOUNTER — Ambulatory Visit: Payer: Self-pay | Admitting: Cardiology

## 2019-12-03 DIAGNOSIS — R300 Dysuria: Secondary | ICD-10-CM | POA: Diagnosis not present

## 2019-12-03 DIAGNOSIS — M545 Low back pain: Secondary | ICD-10-CM | POA: Diagnosis not present

## 2019-12-07 ENCOUNTER — Ambulatory Visit (INDEPENDENT_AMBULATORY_CARE_PROVIDER_SITE_OTHER): Payer: BC Managed Care – PPO | Admitting: Internal Medicine

## 2019-12-07 ENCOUNTER — Other Ambulatory Visit: Payer: Self-pay

## 2019-12-07 ENCOUNTER — Encounter (INDEPENDENT_AMBULATORY_CARE_PROVIDER_SITE_OTHER): Payer: Self-pay | Admitting: Internal Medicine

## 2019-12-07 VITALS — BP 105/72 | HR 58 | Temp 99.0°F | Ht 63.0 in | Wt 196.0 lb

## 2019-12-07 DIAGNOSIS — R079 Chest pain, unspecified: Secondary | ICD-10-CM | POA: Diagnosis not present

## 2019-12-07 DIAGNOSIS — K219 Gastro-esophageal reflux disease without esophagitis: Secondary | ICD-10-CM | POA: Diagnosis not present

## 2019-12-07 DIAGNOSIS — H43811 Vitreous degeneration, right eye: Secondary | ICD-10-CM | POA: Diagnosis not present

## 2019-12-07 MED ORDER — GI COCKTAIL ~~LOC~~: 30.0000 mL | Freq: Two times a day (BID) | ORAL | 0 refills | Status: DC | PRN

## 2019-12-07 NOTE — Patient Instructions (Addendum)
Please symptom diary for the next 4 weeks and call with progress report. Can take GI cocktail up to twice daily and no more. Can take Pepcid/famotidine OTC 20 mg in the evening for heartburn or chest pain as needed basis.

## 2019-12-08 ENCOUNTER — Telehealth (INDEPENDENT_AMBULATORY_CARE_PROVIDER_SITE_OTHER): Payer: Self-pay | Admitting: *Deleted

## 2019-12-08 NOTE — Telephone Encounter (Signed)
Patient's pharmacy called and advised that they can no longer get the [place] was also called, and they had been told the same thing.  Dr.Rehman has been sent a message asking what  He may want to change the Rx to. Once we [place] and patient will be notified.

## 2019-12-08 NOTE — Progress Notes (Signed)
Presenting complaint;  Chest and intrascapular pain. History of GERD.  Database and subjective:  Patient is 59 year old female who has a history of GERD and history of gallbladder polyps for which she eventually underwent laparoscopic cholecystectomy in December 2020.  She has been maintained on pantoprazole every morning with famotidine on as-needed basis. She states she was doing well until about 3 weeks ago when she noted intrascapular pain while driving.  She also only noted chest pain laterally on either side.  She says that morning she had decaf coffee and a diet soda but did not eat lunch.  She says she had mild pain 3 days earlier but she did not pay any attention.  She was seen at urgent care.  Her EKG was abnormal and she was therefore referred to emergency room at [facility]  She was evaluated and felt to have noncardiac pain.  She was given GI cocktail and pain was relieved in a matter of few minutes. Since then she has had bilateral chest pain along the sides.  She is also having intermittent intrascapular pain.  She is not sure if it is related to meals or physical activity.  Intrascapular pain occurs at least 3 times a week.  She has history of costochondritis and fibromyalgia and she is not sure what is causing this pain.  While in the emergency room she was given prescription for sucralfate and advised to take Gaviscon and she has done neither. She says she is on Cardizem for tachycardia. She was seen by Dr. Evalyn Casco on 11/23/2019.  He noted negative cardiac work-up in the past.  He did not feel that this pain was anginal pain. She denies nausea vomiting dysphagia melena or rectal bleeding.  Her bowels move daily but her consistency varies.  She had EGD in December 2020 prior to her cholecystectomy.  She had grade a reflux esophagitis small sliding hiatal hernia and few antral erosions but biopsy was negative for H. pylori infection.  Current Medications: Outpatient  Encounter Medications as of 12/07/2019  Medication Sig  . acetaminophen (TYLENOL) 650 MG CR tablet Take 650 mg by mouth 2 (two) times daily as needed for pain.  [name] albuterol (PROAIR HFA) 108 (90 Base) MCG/ACT inhaler Inhale 1-2 puffs into the lungs every 4 (four) hours as needed for wheezing or shortness of breath.   [name] aspirin EC 81 MG tablet Take 81 mg by mouth daily at 12 noon.   . Cholecalciferol (VITAMIN D) 50 MCG (2000 UT) CAPS Take 2,000 Units by mouth daily.  [name] diltiazem (CARDIZEM CD) 120 MG 24 hr capsule Take 120 mg by mouth at bedtime.   . famotidine (PEPCID) 20 MG tablet Take 1 tablet (20 mg total) by mouth daily.  [name] KLOR-CON M20 20 MEQ tablet Take 20 mEq by mouth at bedtime.   [name] loratadine (CLARITIN) 10 MG tablet Take 10 mg by mouth daily at 12 noon.   . Menthol-Methyl Salicylate (SALONPAS PAIN RELIEF PATCH EX) Apply 2-3 patches topically daily as needed (back / shoulder pain).  . metoprolol tartrate (LOPRESSOR) 50 MG tablet Take 1 tablet (50 mg total) by mouth 2 (two) times daily.  . pantoprazole (PROTONIX) 40 MG tablet TAKE 1 TABLET BY MOUTH EVERY DAY  . rosuvastatin (CRESTOR) 10 MG tablet Take 10 mg by mouth every other day. At 12 noon  . Alum & Mag Hydroxide-Simeth (GI COCKTAIL) SUSP suspension Take 30 mLs by mouth 2 (two) times daily as needed for indigestion. Shake well.  [name]  chlorthalidone (HYGROTON) 25 MG tablet Take 0.5 tablets (12.5 mg total) by mouth daily at 12 noon.  . [DISCONTINUED] potassium chloride (KLOR-CON) 10 MEQ tablet Take 2 tablets (20 mEq total) by mouth daily. (Patient not taking: Reported on 12/07/2019)  . [DISCONTINUED] sucralfate (CARAFATE) 1 GM/10ML suspension Take 10 mLs (1 g total) by mouth 4 (four) times daily -  with meals and at bedtime. (Patient not taking: Reported on 12/07/2019)   No facility-administered encounter medications on file as of 12/07/2019.     Objective: Blood pressure 105/72, pulse (!) 58, temperature 99 F (37.2 C), temperature source  Oral, height '5\' 3"'$  (1.6 m), weight 196 lb (88.9 kg), last menstrual period 08/25/2012. Patient is alert and in no acute distress. She is wearing a mask. Conjunctiva is pink. Sclera is nonicteric Oropharyngeal mucosa is normal. No neck masses or thyromegaly noted. Cardiac exam with regular rhythm normal S1 and S2. No murmur or gallop noted. Lungs are clear to auscultation. Abdomen is symmetrical soft and nontender with organomegaly or masses. No LE edema or clubbing noted.  Labs/studies Results:  CBC Latest Ref Rng & Units 11/10/2019 05/31/2019 03/10/2019  WBC 4.0 - 10.5 K/uL 9.1 8.8 9.3  Hemoglobin 12.0 - 15.0 g/dL 12.2 13.4 12.9  Hematocrit 36 - 46 % 38.1 40.5 39.4  Platelets 150 - 400 K/uL 355 362 374    CMP Latest Ref Rng & Units 11/10/2019 05/31/2019 03/10/2019  Glucose 70 - 99 mg/dL 93 97 89  BUN 6 - 20 mg/dL '17 14 21  '$ Creatinine 0.44 - 1.00 mg/dL 1.15(H) 1.17(H) 1.26(H)  Sodium 135 - 145 mmol/L 139 141 142  Potassium 3.5 - 5.1 mmol/L 3.2(L) 3.1(L) 4.3  Chloride 98 - 111 mmol/L 103 101 102  CO2 22 - 32 mmol/L '25 29 30  '$ Calcium 8.9 - 10.3 mg/dL 9.9 10.2 10.5(H)  Total Protein 6.5 - 8.1 g/dL 7.8 7.6 7.3  Total Bilirubin 0.3 - 1.2 mg/dL 0.6 0.9 0.5  Alkaline Phos 38 - 126 U/L 54 53 -  AST 15 - 41 U/L '20 18 15  '$ ALT 0 - 44 U/L '12 11 10    '$ Hepatic Function Latest Ref Rng & Units 11/10/2019 05/31/2019 03/10/2019  Total Protein 6.5 - 8.1 g/dL 7.8 7.6 7.3  Albumin 3.5 - 5.0 g/dL 4.3 4.0 -  AST 15 - 41 U/L '20 18 15  '$ ALT 0 - 44 U/L '12 11 10  '$ Alk Phosphatase 38 - 126 U/L 54 53 -  Total Bilirubin 0.3 - 1.2 mg/dL 0.6 0.9 0.5  Bilirubin, Direct 0.0 - 0.2 mg/dL 0.1 - -      Assessment:  #1.  Chest pain.  Chest pain is atypical.  She is having bilateral side pain as well as interscapular pain but no retrosternal pain.  This is most likely musculoskeletal pain.  Location does not favor costochondritis.  She does not have any alarm symptoms.  I believe keeping symptom diary may help sort this  pain out.  #2.  History of erosive reflux esophagitis.  She is doing well with therapy.  I wonder if intrascapular pain is due to GERD.   Plan:  Patient reassured. She will continue pantoprazole 40 mg by mouth 30 minutes before breakfast daily. She can take famotidine OTC 20 mg in the evening for heartburn or chest pain on as-needed basis. GI cocktail 1 dose once or twice daily as needed. She will keep symptom diary for the next 4 weeks and call office with progress report. Office visit  in 6 months.

## 2019-12-09 ENCOUNTER — Telehealth (INDEPENDENT_AMBULATORY_CARE_PROVIDER_SITE_OTHER): Payer: Self-pay | Admitting: *Deleted

## 2019-12-09 NOTE — Telephone Encounter (Signed)
We had talked with the pharmacy day after yesterday. We were made aware that they could not get [place] any longer. Reviewed with Dr.Rehman , he ask that we call and ask that they use the following medication for the GI Cocktail. 12 ml of the Maalox , 12 mL of the Lidocaine , and 5 mL of the Bentyl.  I have tried several times to reach the pharmacy and my call will not go through. I will try again to reach them to give prescription.

## 2019-12-21 ENCOUNTER — Ambulatory Visit: Payer: Self-pay | Admitting: Cardiology

## 2019-12-27 DIAGNOSIS — N183 Chronic kidney disease, stage 3 unspecified: Secondary | ICD-10-CM | POA: Diagnosis not present

## 2019-12-27 DIAGNOSIS — R5383 Other fatigue: Secondary | ICD-10-CM | POA: Diagnosis not present

## 2019-12-27 DIAGNOSIS — E782 Mixed hyperlipidemia: Secondary | ICD-10-CM | POA: Diagnosis not present

## 2019-12-27 DIAGNOSIS — R944 Abnormal results of kidney function studies: Secondary | ICD-10-CM | POA: Diagnosis not present

## 2019-12-27 DIAGNOSIS — I10 Essential (primary) hypertension: Secondary | ICD-10-CM | POA: Diagnosis not present

## 2020-01-06 DIAGNOSIS — R7301 Impaired fasting glucose: Secondary | ICD-10-CM | POA: Diagnosis not present

## 2020-01-06 DIAGNOSIS — J4520 Mild intermittent asthma, uncomplicated: Secondary | ICD-10-CM | POA: Diagnosis not present

## 2020-01-06 DIAGNOSIS — Z23 Encounter for immunization: Secondary | ICD-10-CM | POA: Diagnosis not present

## 2020-01-06 DIAGNOSIS — G43909 Migraine, unspecified, not intractable, without status migrainosus: Secondary | ICD-10-CM | POA: Diagnosis not present

## 2020-01-06 DIAGNOSIS — I10 Essential (primary) hypertension: Secondary | ICD-10-CM | POA: Diagnosis not present

## 2020-01-17 DIAGNOSIS — H35372 Puckering of macula, left eye: Secondary | ICD-10-CM | POA: Diagnosis not present

## 2020-01-26 DIAGNOSIS — R0789 Other chest pain: Secondary | ICD-10-CM | POA: Diagnosis not present

## 2020-01-26 DIAGNOSIS — G43909 Migraine, unspecified, not intractable, without status migrainosus: Secondary | ICD-10-CM | POA: Diagnosis not present

## 2020-03-06 DIAGNOSIS — G43909 Migraine, unspecified, not intractable, without status migrainosus: Secondary | ICD-10-CM | POA: Diagnosis not present

## 2020-03-07 DIAGNOSIS — R253 Fasciculation: Secondary | ICD-10-CM | POA: Diagnosis not present

## 2020-03-07 DIAGNOSIS — R42 Dizziness and giddiness: Secondary | ICD-10-CM | POA: Diagnosis not present

## 2020-03-07 DIAGNOSIS — R519 Headache, unspecified: Secondary | ICD-10-CM | POA: Diagnosis not present

## 2020-03-07 DIAGNOSIS — G43809 Other migraine, not intractable, without status migrainosus: Secondary | ICD-10-CM | POA: Diagnosis not present

## 2020-03-07 DIAGNOSIS — R490 Dysphonia: Secondary | ICD-10-CM | POA: Diagnosis not present

## 2020-03-28 ENCOUNTER — Ambulatory Visit: Payer: BC Managed Care – PPO | Admitting: Neurology

## 2020-03-28 ENCOUNTER — Other Ambulatory Visit: Payer: Self-pay

## 2020-03-28 ENCOUNTER — Encounter: Payer: Self-pay | Admitting: Neurology

## 2020-03-28 VITALS — BP 135/74 | HR 56 | Ht 63.0 in | Wt 200.0 lb

## 2020-03-28 DIAGNOSIS — Z8679 Personal history of other diseases of the circulatory system: Secondary | ICD-10-CM | POA: Diagnosis not present

## 2020-03-28 DIAGNOSIS — R519 Headache, unspecified: Secondary | ICD-10-CM

## 2020-03-28 MED ORDER — TIZANIDINE HCL 4 MG PO TABS
4.0000 mg | ORAL_TABLET | Freq: Four times a day (QID) | ORAL | 6 refills | Status: DC | PRN
Start: 2020-03-28 — End: 2020-09-19

## 2020-03-28 MED ORDER — ONDANSETRON 4 MG PO TBDP: 4.0000 mg | ORAL_TABLET | Freq: Three times a day (TID) | ORAL | 6 refills | Status: DC | PRN

## 2020-03-28 MED ORDER — NORTRIPTYLINE HCL 10 MG PO CAPS
20.0000 mg | ORAL_CAPSULE | Freq: Every day | ORAL | 11 refills | Status: DC
Start: 2020-03-28 — End: 2020-04-25

## 2020-03-28 NOTE — Progress Notes (Signed)
Chief Complaint  Patient presents with  . Headache    She has started having headaches nearly everyday. Some are worse than others. Her PCP offered her a prescription for sumatriptan but she declined trying it until she was seen here. She recently had a normal MRI brain at [facility] (on 03/08/20). She felt her headache fequency worsened after her second dose of the Shingles vaccination (on 01/06/20). She also continues to have neck pain along with discomfort in her left arm.     HISTORICAL  [name] [name], seen in request by  I reviewed and summarized the referring note. PMH HTN,  HLD,  right ICA terminus aneurysm s/p clipping in 2013   Patient was following with Dr.penumalli in 2013 for headache. At that time, she complained of intermittent headache, right or left-sided, associated with neck pain, left shoulder pain, nausea, blurred vision, and dizziness. Headaches were proceeded by feeding of sore spots over the scalp. She took Tylenol for headache. However she had MRI and MRA at that time showed 45 mm terminal right ICA aneurysm. MRI C-spine showed minimal degenerative changes without significant spinal stenosis. She underwent aneurysm clipping by Dr. Cyndy Freeze and after that her headache was gone for several years.  Since 2019, her headache gradually coming back, her headache was very similar to previous headache, starting with lower neck pain, bilateral shoulder pain, up to the back of her head, either left or right-sided with neck tightness. Headaches are more pressure like, dull feeling, sometimes lasting 2-3 days. Tylenol works if takes early. For the last 2 weeks, headache seems getting worse. At that same time she had right hand cramping after overexerting the right hand in a party, as well as sometimes left arm tingling. Went to see PCP, had a stress test which was negative and also had MRI/MRA showed previous aneurysm clipping without change, however, also showed symmetric mild signal along  bilateral corticospinal tracts, progressed from 2015. It reported this finding is nonspecific however may be seen associated with ALS. Due to this abnormal findings, patient was referred here for further evaluation.  She has history of hypertension, on Cardizem, hydralazine, and metoprolol. Recently her blood pressure was on the lower side and her PCP is trying to taper off hydralazine. Today blood pressure 99/60. Questionable history of fibromyalgia. He is also on aspirin daily. Denies any alcohol smoking or using illicit drugs.  03/25/16 follow up- she has had some improvement with PT/OT. However, she still has intermittent mild HA at back of head and neck pain and tight. She sometimes has tenderness along the occipital nerve distribution. She did not start amitriptyline as she had confusion with dosing. She uses lidocaine cream PRN. She admitted that in the morning seems less symptoms and as the day goes by, she may feel more symptoms. She told me that she also has fibromyalgia. BP today 115/75.   07/02/16 follow up- pt has been doing well. HA intermittent but improved. Currently no HA. However, on 06/19/16, she was at work, suddenly she had one sharp pain at right side of face, lasted several min, and she also started to have right arm and right chest pain, BP up, she got up and walking out to hallway, about to pass out, her co-worker called EMS and she was sent to ED. At ED, her right facial pain gone, but started to have right dull HA, and still has some mild right arm and chest pain, EKG negative. She had MRI again demonstrated b/l corticospinal tract hyper  T2 signal, unchanged. MRA neck negative but MRA head showed left supraclinoid ICA 57mm, concerning for small aneurysm. She followed with NSG Dr. Christella Noa and no intervention needed and will consider CTA head if needed. However, when comparing with her CTA head in 2014, it was unchanged. Pt came in today want to seek my opinion on that.   UPDATE Mar 28 2020: She lost follow up since Feb 2019. Followed by her primary care physician for her headaches. It has happened a lot lately.  She complains of increased headache following her second shingles vaccination in September 2021, 4-5 times a week, Tylenol provide partial help, She also complains of bilateral shoulder pain, neck tension, often spreading forward to become mild pressure headaches, She complains of difficulty sleeping, also complains of dry eyes, was seen by ophthalmologist recently, she denies focal signs.  I personally reviewed CT head with contrast in January 2018, no acute abnormality, prior right craniotomy and right aneurysm clipping  CT angiogram of the head in February 2019, no evidence of secondary aneurysm  REVIEW OF SYSTEMS: Full 14 system review of systems performed and notable only for as above All other review of systems were negative.  ALLERGIES: Allergies  Allergen Reactions  . Ace Inhibitors Swelling  . Azithromycin Anaphylaxis, Shortness Of Breath and Swelling    Breathing difficulty, swelling  . Nsaids     Kidney disease  . Other Swelling, Other (See Comments) and Cough    Sneezing allergy to cats/grass/dust  . Statins Other (See Comments)    Cramping, muscle pain. Tolerates Crestor  Cramping, muscle pain Cramping, muscle pain. Tolerates Crestor   . Ciprofloxacin Nausea And Vomiting    Acute kidney failure   Also: kidney failure Acute kidney failure   . Medrol [Methylprednisolone] Palpitations    Heart racing, increased BP  . Tetracyclines & Related Palpitations    Heart racing, increased bp    HOME MEDICATIONS: Current Outpatient Medications  Medication Sig Dispense Refill  . acetaminophen (TYLENOL) 650 MG CR tablet Take 650 mg by mouth 2 (two) times daily as needed for pain.    [name] albuterol (PROAIR HFA) 108 (90 Base) MCG/ACT inhaler Inhale 1-2 puffs into the lungs every 4 (four) hours as needed for wheezing or shortness of breath.     [name]  aspirin EC 81 MG tablet Take 81 mg by mouth daily at 12 noon.     . chlorthalidone (HYGROTON) 25 MG tablet Take 0.5 tablets (12.5 mg total) by mouth daily at 12 noon. 45 tablet 3  . Cholecalciferol (VITAMIN D) 50 MCG (2000 UT) CAPS Take 2,000 Units by mouth daily.    [name] diltiazem (CARDIZEM CD) 120 MG 24 hr capsule Take 120 mg by mouth at bedtime.     [name] KLOR-CON M20 20 MEQ tablet Take 20 mEq by mouth at bedtime.     [name] loratadine (CLARITIN) 10 MG tablet Take 10 mg by mouth daily at 12 noon.     . Menthol-Methyl Salicylate (SALONPAS PAIN RELIEF PATCH EX) Apply 2-3 patches topically daily as needed (back / shoulder pain).    . pantoprazole (PROTONIX) 40 MG tablet TAKE 1 TABLET BY MOUTH EVERY DAY 90 tablet 1  . rosuvastatin (CRESTOR) 10 MG tablet Take 10 mg by mouth every other day. At 12 noon    . metoprolol tartrate (LOPRESSOR) 50 MG tablet Take 1 tablet (50 mg total) by mouth 2 (two) times daily. 180 tablet 3   No current facility-administered medications for this visit.  PAST MEDICAL HISTORY: Past Medical History:  Diagnosis Date  . Aneurysm ([place])    brain  . Chest pain    denied on 06/18/2012  . Chronic kidney disease    L - upper pole- defect, doesn't effect     . Family history of anesthesia complication    brother & neice "flat lined" during induction: neice d/t a medication given for a blood clotting problem; brother d/t "miscalculated amount of anesthesia"  . Fibromyalgia   . GERD (gastroesophageal reflux disease)    h/o colitis   . H. pylori infection    2012- stomach biopsy  . H/O hiatal hernia   . Headache(784.0)    using tylenol prn  . Hyperlipidemia   . Hypertension   . Hypoglycemia   . Normal cardiac stress test    pt. released fr. Dr. [name] care in 10/2011, all findings w/i normal limits  . Overweight(278.02)    Obesity  . Palpitations   . Sinusitis, acute    seen by PCP- 06/17/2012, will treat /w augmentin & tussin/DM  . Uterine fibroid    pt. states that she  has a "closed cervix"     PAST SURGICAL HISTORY: Past Surgical History:  Procedure Laterality Date  . BIOPSY  04/14/2019   Procedure: BIOPSY;  Surgeon: Rogene Houston, MD;  Location: [facility];  Service: Endoscopy;;  antrum  . CHOLECYSTECTOMY N/A 04/28/2019   Procedure: LAPAROSCOPIC CHOLECYSTECTOMY;  Surgeon: Aviva Signs, MD;  Location: [facility];  Service: General;  Laterality: N/A;  . COLONOSCOPY    . COLONOSCOPY N/A 11/26/2018   Procedure: COLONOSCOPY;  Surgeon: Rogene Houston, MD;  Location: [facility];  Service: Endoscopy;  Laterality: N/A;  1:00-12:00pm  . CRANIOTOMY Right 06/24/2012   Procedure: CRANIOTOMY INTRACRANIAL ANEURYSM FOR CAROTID;  Surgeon: [name], MD;  Location: [facility];  Service: Neurosurgery;  Laterality: Right;  RIGHT Pterional Craniotomy for aneurysm  . ESOPHAGOGASTRODUODENOSCOPY N/A 04/14/2019   Procedure: ESOPHAGOGASTRODUODENOSCOPY (EGD) WITH POSSIBLE ESOPHAGEAL DILATION;  Surgeon: Rogene Houston, MD;  Location: [facility];  Service: Endoscopy;  Laterality: N/A;  300pm  . LIPOMA EXCISION     buttock 10/2010    FAMILY HISTORY: Family History  Problem Relation Age of Onset  . Hypertension Other   . Coronary artery disease Other   . Dementia Mother   . Dementia Maternal Aunt     SOCIAL HISTORY: Social History   Socioeconomic History  . Marital status: Single    Spouse name: Not on file  . Number of children: Not on file  . Years of education: Not on file  . Highest education level: Not on file  Occupational History  . Occupation: Full time    Employer: [employer]  Tobacco Use  . Smoking status: Former Smoker    Packs/day: 0.80    Years: 15.00    Pack years: 12.00    Types: Cigarettes    Start date: 02/13/1989    Quit date: 04/28/2004    Years since quitting: 15.9  . Smokeless tobacco: Never Used  Vaping Use  . Vaping Use: Never used  Substance and Sexual Activity  . Alcohol use: No    Alcohol/week: 0.0  standard drinks  . Drug use: No  . Sexual activity: Never    Birth control/protection: None, Abstinence  Other Topics Concern  . Not on file  Social History Narrative   Single   Social Determinants of Health   Financial Resource Strain:   .  Difficulty of Paying Living Expenses: Not on file  Food Insecurity:   . Worried About Charity fundraiser in the Last Year: Not on file  . Ran Out of Food in the Last Year: Not on file  Transportation Needs:   . Lack of Transportation (Medical): Not on file  . Lack of Transportation (Non-Medical): Not on file  Physical Activity:   . Days of Exercise per Week: Not on file  . Minutes of Exercise per Session: Not on file  Stress:   . Feeling of Stress : Not on file  Social Connections:   . Frequency of Communication with Friends and Family: Not on file  . Frequency of Social Gatherings with Friends and Family: Not on file  . Attends Religious Services: Not on file  . Active Member of Clubs or Organizations: Not on file  . Attends Archivist Meetings: Not on file  . Marital Status: Not on file  Intimate Partner Violence:   . Fear of Current or Ex-Partner: Not on file  . Emotionally Abused: Not on file  . Physically Abused: Not on file  . Sexually Abused: Not on file     PHYSICAL EXAM   Vitals:   03/28/20 0937  BP: 135/74  Pulse: (!) 56  Weight: 200 lb (90.7 kg)  Height: 5\' 3"  (1.6 m)   Not recorded     Body mass index is 35.43 kg/m.  PHYSICAL EXAMNIATION:  Gen: NAD, conversant, well nourised, well groomed                     Cardiovascular: Regular rate rhythm, no peripheral edema, warm, nontender. Eyes: Conjunctivae clear without exudates or hemorrhage Neck: Supple, no carotid bruits. Pulmonary: Clear to auscultation bilaterally   NEUROLOGICAL EXAM:  MENTAL STATUS: Speech/cognition: Awake, alert, oriented to history taking and casual conversation   CRANIAL NERVES: CN II: Visual fields are full to  confrontation. Pupils are round equal and briskly reactive to light. CN III, IV, VI: extraocular movement are normal. No ptosis. CN V: Facial sensation is intact to light touch CN VII: Face is symmetric with normal eye closure  CN VIII: Hearing is normal to causal conversation. CN IX, X: Phonation is normal. CN XI: Head turning and shoulder shrug are intact  MOTOR: There is no pronator drift of out-stretched arms. Muscle bulk and tone are normal. Muscle strength is normal.  REFLEXES: Reflexes are 2+ and symmetric at the biceps, triceps, knees, and ankles. Plantar responses are flexor.  SENSORY: Intact to light touch, pinprick and vibratory sensation are intact in fingers and toes.  COORDINATION: There is no trunk or limb dysmetria noted.  GAIT/STANCE: Posture is normal. Gait is steady with normal steps, base, arm swing, and turning. Heel and toe walking are normal. Tandem gait is normal.  Romberg is absent.   DIAGNOSTIC DATA (LABS, IMAGING, TESTING) - I reviewed patient records, labs, notes, testing and imaging myself where available.   ASSESSMENT AND PLAN  [name] [name] is a 59 y.o. female   History of right internal carotid artery terminus aneurysm clipping Frequent headaches with migraine features  Start preventive medication nortriptyline 10 mg titrating to 20 mg every night  Continue Tylenol as needed, may combine with Zofran and tizanidine for moderate to severe headache   Marcial Pacas, M.D. Ph.D.  [facility] [address], [place], [place] [address] Ph: [phone] Fax: [phone]  CC:  [name], MD [place],  [place] [address]  [name], MD

## 2020-04-20 ENCOUNTER — Telehealth (INDEPENDENT_AMBULATORY_CARE_PROVIDER_SITE_OTHER): Payer: Self-pay | Admitting: Internal Medicine

## 2020-04-20 NOTE — Telephone Encounter (Signed)
I spoke with the patient she states she has been having a tightness and heaviness in her mid back between her shoulder blades office and on since 12/07/2019. She states she has woke this am with the pain that seems worse, an achy pain and tightness.She has been bloated and had gas and no bowel movement since day before yesterday. She states she had been using Gavison and it had helped, but not helping much any longer states the symptoms have worsened over the last five days. No nausea,vomiting,diarrhea, no fever. Has been checked a number of time for covid and has been negative every time. States she had some Tachycardia on Sunday night and her Bp at that time was 190/84. Today her vitals are 99% sat level Temp 98.4 Bp today while on the phone was 137/82 pulse of 64. Please advise the patient on what she should do. Thanks

## 2020-04-20 NOTE — Telephone Encounter (Signed)
I called the patient again and left another message stating if she still needed our assistance, if so please call the office back.

## 2020-04-20 NOTE — Telephone Encounter (Signed)
I called and left a vm asked that the patient call us back so we can get more information.

## 2020-04-20 NOTE — Telephone Encounter (Signed)
Patient's call returned. She can increase Pamelor to 30 mg at bedtime if 20 is not helping with her chest and abdominal symptoms or back pain I asked patient keep symptom diary until office visit on 06/14/2019. If she has severe pain she will need to go to emergency room where she can have emergency testing or studies done

## 2020-04-20 NOTE — Telephone Encounter (Signed)
Please advise 

## 2020-04-20 NOTE — Telephone Encounter (Signed)
Patient called the office states she is having a lot of pain - seems to think she may need a GI cocktail - - has an appointment with NUR in February - please advise - ph# [phone]

## 2020-04-25 ENCOUNTER — Other Ambulatory Visit: Payer: Self-pay | Admitting: Neurology

## 2020-04-28 DIAGNOSIS — R059 Cough, unspecified: Secondary | ICD-10-CM | POA: Diagnosis not present

## 2020-04-28 DIAGNOSIS — R52 Pain, unspecified: Secondary | ICD-10-CM | POA: Diagnosis not present

## 2020-04-28 DIAGNOSIS — J029 Acute pharyngitis, unspecified: Secondary | ICD-10-CM | POA: Diagnosis not present

## 2020-04-28 DIAGNOSIS — R509 Fever, unspecified: Secondary | ICD-10-CM | POA: Diagnosis not present

## 2020-05-29 ENCOUNTER — Ambulatory Visit: Payer: BC Managed Care – PPO | Admitting: Cardiology

## 2020-05-30 ENCOUNTER — Encounter: Payer: Self-pay | Admitting: Cardiology

## 2020-05-30 ENCOUNTER — Encounter: Payer: Self-pay | Admitting: *Deleted

## 2020-05-30 ENCOUNTER — Ambulatory Visit: Payer: BC Managed Care – PPO | Admitting: Cardiology

## 2020-05-30 VITALS — BP 110/74 | HR 82 | Ht 63.0 in | Wt 195.0 lb

## 2020-05-30 DIAGNOSIS — I10 Essential (primary) hypertension: Secondary | ICD-10-CM | POA: Diagnosis not present

## 2020-05-30 DIAGNOSIS — R002 Palpitations: Secondary | ICD-10-CM | POA: Diagnosis not present

## 2020-05-30 DIAGNOSIS — R0789 Other chest pain: Secondary | ICD-10-CM | POA: Diagnosis not present

## 2020-05-30 NOTE — Patient Instructions (Signed)
Your physician recommends that you schedule a follow-up appointment in: 6 MONTHS WITH DR BRANCH  Your physician recommends that you continue on your current medications as directed. Please refer to the Current Medication list given to you today.  Thank you for choosing [facility]!!    

## 2020-05-30 NOTE — Progress Notes (Signed)
Clinical Summary [name] is a 60 y.o.female seen today for follow up of the following medical problems.   1. Palpitations - admit 03/2014 with palpitations and chest pain. On that day was hypertensive and tachycardic on admission. - MPI without evidence of ischemia, LVEF 78%. Echo "normal LVEF". TSH 1.4. Event monitor with occasional PACs, no significant arrhythmias - holter 11/2016 no arrhythmias  - initially dilt stopped after recent admission with diverticulitis and hypotension. Since discharge bp's have increased, restarte back on 120mg  daily  - 3 episodes of palpitations she relates to stress since last visit -     2. HTN - hydralazinepreviously stopped due to concerns for possible drug induced lupus. Symptoms better. - her dilt was prevoiusly decreased, with this significant elvation in bp's and she went back to takeing 180mg  bid - has been on fairly high dose dual av nodal agents due to history of chronic palpitations, has tolerated regimen.   - diltiazem stopped during Jan 2020 admission with acute diverticulitis due to low bp's. Had also had some prior low heart rates. Chlothalidone stopped as well - pcp restarted chlorthalidione and diltiazem forrecurrenthigh bp's  - compliant with meds   3. Diverticulitis - admitted Jan 2020 with abdominal pain and diarrhea, CT with evidecne of possible diverticulitis - treated with abx  4. Chest pain - long history of atypical chest pain - she completed a lexiscan12/2015that was overall low risk, small apical defect probable artifact. - seen at [facility] 10/17 with chest pain. CT PE negative.  07/2015 nuclear probable apical thinning, cannot exlcude mild apical ischemia - seen in ER 08/2017 with chest pain, worst with movement and cough. No evidence of ischemia by EKG or enzymes   05/31/2019 ER visit with epigastric/lower chest pain - from ER notes tender to palpation WBC 8.8 Hgb 13.4 K 3.1  Cr 1.17 hstrop 31-->26-->15 Ddimer 0.58  EKG SR no acute ishcemic changes - CT PE: no PE, no acute process - CT A/P no acute process  - recently had gallbladder removed , pain started about 1 day after - pressure epigastric and midchest, below sternum. Constant pain more than 10 hours. +tachycardia, elevated blood pressures. Pain in midback.  - has had issues with constipiation after surgery.   11/10/19 seen in ER with back pain, some radiation into chest - from ER notes symptoms resolved with GI cocktail - trop neg x 2, Ddimer neg. EKG SR, no ischeimc changes  -chronic stable symptoms overall unchanged.         SH: mom with recent stroke, currently at [facility].    Past Medical History:  Diagnosis Date  . 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 Chest pain    denied on 06/18/2012  . Chronic kidney disease    L - upper pole- defect, doesn't effect     . Family history of anesthesia complication    brother & neice "flat lined" during induction: neice d/t a medication given for a blood clotting problem; brother d/t "miscalculated amount of anesthesia"  . Fibromyalgia   . GERD (gastroesophageal reflux disease)    h/o colitis   . H. pylori infection    2012- stomach biopsy  . H/O hiatal hernia   . Headache(784.0)    using tylenol prn  . Hyperlipidemia   . Hypertension   . Hypoglycemia   . Normal cardiac stress test    pt. released fr. Dr. [name] care in 10/2011, all findings w/i normal limits  .  Overweight(278.02)    Obesity  . Palpitations   . Sinusitis, acute    seen by PCP- 06/17/2012, will treat /w augmentin & tussin/DM  . Uterine fibroid    pt. states that she has a "closed cervix"      Allergies  Allergen Reactions  . Ace Inhibitors Swelling  . Azithromycin Anaphylaxis, Shortness Of Breath and Swelling    Breathing difficulty, swelling  . Nsaids     Kidney disease  . Other Swelling, Other (See Comments) and Cough    Sneezing allergy to  cats/grass/dust  . Statins Other (See Comments)    Cramping, muscle pain. Tolerates Crestor  Cramping, muscle pain Cramping, muscle pain. Tolerates Crestor   . Ciprofloxacin Nausea And Vomiting    Acute kidney failure   Also: kidney failure Acute kidney failure   . Medrol [Methylprednisolone] Palpitations    Heart racing, increased BP  . Tetracyclines & Related Palpitations    Heart racing, increased bp     Current Outpatient Medications  Medication Sig Dispense Refill  . acetaminophen (TYLENOL) 650 MG CR tablet Take 650 mg by mouth 2 (two) times daily as needed for pain.    [name] albuterol (PROAIR HFA) 108 (90 Base) MCG/ACT inhaler Inhale 1-2 puffs into the lungs every 4 (four) hours as needed for wheezing or shortness of breath.     [name] aspirin EC 81 MG tablet Take 81 mg by mouth daily at 12 noon.     . chlorthalidone (HYGROTON) 25 MG tablet Take 0.5 tablets (12.5 mg total) by mouth daily at 12 noon. 45 tablet 3  . Cholecalciferol (VITAMIN D) 50 MCG (2000 UT) CAPS Take 2,000 Units by mouth daily.    [name] diltiazem (CARDIZEM CD) 120 MG 24 hr capsule Take 120 mg by mouth at bedtime.     [name] KLOR-CON M20 20 MEQ tablet Take 20 mEq by mouth at bedtime.     [name] loratadine (CLARITIN) 10 MG tablet Take 10 mg by mouth daily at 12 noon.     . Menthol-Methyl Salicylate (SALONPAS PAIN RELIEF PATCH EX) Apply 2-3 patches topically daily as needed (back / shoulder pain).    . metoprolol tartrate (LOPRESSOR) 50 MG tablet Take 1 tablet (50 mg total) by mouth 2 (two) times daily. 180 tablet 3  . nortriptyline (PAMELOR) 10 MG capsule TAKE 2 CAPSULES BY MOUTH AT BEDTIME. 180 capsule 3  . ondansetron (ZOFRAN ODT) 4 MG disintegrating tablet Take 1 tablet (4 mg total) by mouth every 8 (eight) hours as needed. 20 tablet 6  . pantoprazole (PROTONIX) 40 MG tablet TAKE 1 TABLET BY MOUTH EVERY DAY 90 tablet 1  . rosuvastatin (CRESTOR) 10 MG tablet Take 10 mg by mouth every other day. At 12 noon    . tiZANidine (ZANAFLEX)  4 MG tablet Take 1 tablet (4 mg total) by mouth every 6 (six) hours as needed for muscle spasms. 30 tablet 6   No current facility-administered medications for this visit.     Past Surgical History:  Procedure Laterality Date  . BIOPSY  04/14/2019   Procedure: BIOPSY;  Surgeon: Rogene Houston, MD;  Location: [facility];  Service: Endoscopy;;  antrum  . CHOLECYSTECTOMY N/A 04/28/2019   Procedure: LAPAROSCOPIC CHOLECYSTECTOMY;  Surgeon: Aviva Signs, MD;  Location: [facility];  Service: General;  Laterality: N/A;  . COLONOSCOPY    . COLONOSCOPY N/A 11/26/2018   Procedure: COLONOSCOPY;  Surgeon: Rogene Houston, MD;  Location: [facility];  Service: Endoscopy;  Laterality:  N/A;  1:00-12:00pm  . CRANIOTOMY Right 06/24/2012   Procedure: CRANIOTOMY INTRACRANIAL ANEURYSM FOR CAROTID;  Surgeon: [name], MD;  Location: [facility];  Service: Neurosurgery;  Laterality: Right;  RIGHT Pterional Craniotomy for aneurysm  . ESOPHAGOGASTRODUODENOSCOPY N/A 04/14/2019   Procedure: ESOPHAGOGASTRODUODENOSCOPY (EGD) WITH POSSIBLE ESOPHAGEAL DILATION;  Surgeon: Rogene Houston, MD;  Location: [facility];  Service: Endoscopy;  Laterality: N/A;  300pm  . LIPOMA EXCISION     buttock 10/2010     Allergies  Allergen Reactions  . Ace Inhibitors Swelling  . Azithromycin Anaphylaxis, Shortness Of Breath and Swelling    Breathing difficulty, swelling  . Nsaids     Kidney disease  . Other Swelling, Other (See Comments) and Cough    Sneezing allergy to cats/grass/dust  . Statins Other (See Comments)    Cramping, muscle pain. Tolerates Crestor  Cramping, muscle pain Cramping, muscle pain. Tolerates Crestor   . Ciprofloxacin Nausea And Vomiting    Acute kidney failure   Also: kidney failure Acute kidney failure   . Medrol [Methylprednisolone] Palpitations    Heart racing, increased BP  . Tetracyclines & Related Palpitations    Heart racing, increased bp      Family History  Problem  Relation Age of Onset  . Hypertension Other   . Coronary artery disease Other   . Dementia Mother   . Dementia Maternal Aunt      Social History [name] reports that she quit smoking about 16 years ago. Her smoking use included cigarettes. She started smoking about 31 years ago. She has a 12.00 pack-year smoking history. She has never used smokeless tobacco. Ms. Fetterly reports no history of alcohol use.   Review of Systems CONSTITUTIONAL: No weight loss, fever, chills, weakness or fatigue.  HEENT: Eyes: No visual loss, blurred vision, double vision or yellow sclerae.No hearing loss, sneezing, congestion, runny nose or sore throat.  SKIN: No rash or itching.  CARDIOVASCULAR: per hpi RESPIRATORY: No shortness of breath, cough or sputum.  GASTROINTESTINAL: No anorexia, nausea, vomiting or diarrhea. No abdominal pain or blood.  GENITOURINARY: No burning on urination, no polyuria NEUROLOGICAL: No headache, dizziness, syncope, paralysis, ataxia, numbness or tingling in the extremities. No change in bowel or bladder control.  MUSCULOSKELETAL: No muscle, back pain, joint pain or stiffness.  LYMPHATICS: No enlarged nodes. No history of splenectomy.  PSYCHIATRIC: No history of depression or anxiety.  ENDOCRINOLOGIC: No reports of sweating, cold or heat intolerance. No polyuria or polydipsia.  [name]   Physical Examination Today's Vitals   05/30/20 1539  BP: 110/74  Pulse: 82  SpO2: 98%  Weight: 195 lb (88.5 kg)  Height: 5\' 3"  (1.6 m)   Body mass index is 34.54 kg/m.  Gen: resting comfortably, no acute distress HEENT: no scleral icterus, pupils equal round and reactive, no palptable cervical adenopathy,  CV: RRR, no m/r/g, no jvd Resp: Clear to auscultation bilaterally GI: abdomen is soft, non-tender, non-distended, normal bowel sounds, no hepatosplenomegaly MSK: extremities are warm, no edema.  Skin: warm, no rash Neuro:  no focal deficits Psych: appropriate  affect   Diagnostic Studies 05/2009 Nuclear stress No ischemia  03/2014 Echo Study Conclusions  - Left ventricle: The cavity size was normal. Systolic function was normal. Wall motion was normal; there were no regional wall motion abnormalities.  03/2014 Lexiscan MPI IMPRESSION: 1. No reversible ischemia or infarction.  2. Normal left ventricular wall motion.  3. Left ventricular ejection fraction 78%  4. Low-risk stress test  findings*.  03/2014 Event Monitor Symptoms correlate with NSR. One episode of fluttering showed 2 PACs. No significant arrhythmias  07/2015 Lexiscan MPI  There was no ST segment deviation noted during stress.  This is a low risk study.  The left ventricular ejection fraction is mildly decreased (45-54%). LVEF is 49% just slightly below normal, visually appears normal. Consider correlating with echo.  There is a small mild intensity distal anterior and apical defect with mild reversibility. The apex has normal wall motion. Finding is likely secondary to apical thinning, there is also significant radiotracer uptake in the adjacent gut which may create some artifact. Cannot rule would mild apical ischemia. Overall findings are low risk.    11/2016 holter  Min HR 42, Max HR 94, Avg HR 57  No supraventricular or ventricular ectopy  Telemetry tracings show sinus bradycardia and sinus rhythm  No symptoms reported  No significant arrhythmias     Assessment and Plan  1.Chest pain - long history of symptoms with negative prior cardiac workups - no significant change in symptoms, continue to monitor.   2. Palpitations - infrequent symptoms, continue current meds   3. HTN - bp at goal, continue current meds    Arnoldo Lenis, M.D.

## 2020-06-13 ENCOUNTER — Other Ambulatory Visit: Payer: Self-pay

## 2020-06-13 ENCOUNTER — Telehealth (INDEPENDENT_AMBULATORY_CARE_PROVIDER_SITE_OTHER): Payer: BC Managed Care – PPO | Admitting: Internal Medicine

## 2020-09-19 ENCOUNTER — Ambulatory Visit (INDEPENDENT_AMBULATORY_CARE_PROVIDER_SITE_OTHER): Payer: BC Managed Care – PPO | Admitting: Internal Medicine

## 2020-09-19 ENCOUNTER — Other Ambulatory Visit: Payer: Self-pay

## 2020-09-19 ENCOUNTER — Other Ambulatory Visit (HOSPITAL_COMMUNITY): Payer: Self-pay | Admitting: Nephrology

## 2020-09-19 ENCOUNTER — Other Ambulatory Visit: Payer: Self-pay | Admitting: Nephrology

## 2020-09-19 ENCOUNTER — Encounter (INDEPENDENT_AMBULATORY_CARE_PROVIDER_SITE_OTHER): Payer: Self-pay | Admitting: Internal Medicine

## 2020-09-19 VITALS — BP 106/71 | HR 60 | Temp 98.7°F | Ht 63.0 in | Wt 194.2 lb

## 2020-09-19 DIAGNOSIS — K219 Gastro-esophageal reflux disease without esophagitis: Secondary | ICD-10-CM | POA: Diagnosis not present

## 2020-09-19 DIAGNOSIS — R0789 Other chest pain: Secondary | ICD-10-CM | POA: Diagnosis not present

## 2020-09-19 DIAGNOSIS — E6609 Other obesity due to excess calories: Secondary | ICD-10-CM

## 2020-09-19 DIAGNOSIS — N1831 Chronic kidney disease, stage 3a: Secondary | ICD-10-CM

## 2020-09-19 DIAGNOSIS — I5022 Chronic systolic (congestive) heart failure: Secondary | ICD-10-CM

## 2020-09-19 DIAGNOSIS — Z79899 Other long term (current) drug therapy: Secondary | ICD-10-CM

## 2020-09-19 DIAGNOSIS — I129 Hypertensive chronic kidney disease with stage 1 through stage 4 chronic kidney disease, or unspecified chronic kidney disease: Secondary | ICD-10-CM

## 2020-09-19 MED ORDER — FAMOTIDINE 20 MG PO TABS: 20.0000 mg | ORAL_TABLET | Freq: Two times a day (BID) | ORAL | 5 refills | Status: DC | PRN

## 2020-09-19 NOTE — Progress Notes (Signed)
Presenting complaint;  Follow-up for GERD and history of atypical chest pain.  Database and subjective:  Patient is 60 year old female who has a history of GERD and atypical chest pain who is here for scheduled visit.  She was last seen in August 2021. Patient feels heartburn is well controlled with pantoprazole but she is worried because of rising GFR.  She states her GFR was 68 2 months ago and 4 weeks ago was 43.  She has an appointment with a nephrologist in near future. She continues to have chest pain but she says it has migrated from anterior chest to intrascapular area.  She feels Ronis.  She says she has stopped drinking sodas.  He feels almond milk and cold drinks seem to help.  Lately she has noted regurgitation but has not had any nausea or vomiting.  She denies dysphagia.  She also complains of discomfort in left upper quadrant.  She feels as if she is having muscle contractions.  Other times she feels as if something is pushing.  She does not have diarrhea and/or constipation.  She generally has 1-2 stools per day.  She denies melena or rectal bleeding. She had EGD back in December 2020 revealing grade a reflux esophagitis and a small sliding hiatal hernia.  She was also complaining of dysphagia and therefore esophagus was dilated and there was mucosal disruption to proximal esophagus suggestive of a esophageal web.  Gastric biopsy revealed focal ulceration with mild inflammation but no evidence of H. pylori.  Please EGD only revealed antral erosion and not an ulcer. She had laparoscopic cholecystectomy in December 2020 but it did not affect her chest symptoms.    Current Medications: Outpatient Encounter Medications as of 09/19/2020  Medication Sig  . acetaminophen (TYLENOL) 650 MG CR tablet Take 650 mg by mouth 2 (two) times daily as needed for pain.  [name] albuterol (VENTOLIN HFA) 108 (90 Base) MCG/ACT inhaler Inhale 1-2 puffs into the lungs every 4 (four) hours as needed for wheezing  or shortness of breath.   [name] aspirin EC 81 MG tablet Take 81 mg by mouth daily at 12 noon.   . chlorthalidone (HYGROTON) 25 MG tablet Take 0.5 tablets (12.5 mg total) by mouth daily at 12 noon.  . Cholecalciferol (VITAMIN D) 50 MCG (2000 UT) CAPS Take 2,000 Units by mouth daily.  [name] diltiazem (CARDIZEM CD) 120 MG 24 hr capsule Take 120 mg by mouth at bedtime.   [name] KLOR-CON M20 20 MEQ tablet Take 20 mEq by mouth at bedtime.   [name] loratadine (CLARITIN) 10 MG tablet Take 10 mg by mouth daily at 12 noon.  . Menthol-Methyl Salicylate (SALONPAS PAIN RELIEF PATCH EX) Apply 2-3 patches topically daily as needed (back / shoulder pain).  . metoprolol tartrate (LOPRESSOR) 50 MG tablet Take 50 mg by mouth 2 (two) times daily.  . nortriptyline (PAMELOR) 10 MG capsule TAKE 2 CAPSULES BY MOUTH AT BEDTIME.  . pantoprazole (PROTONIX) 40 MG tablet TAKE 1 TABLET BY MOUTH EVERY DAY  . rosuvastatin (CRESTOR) 10 MG tablet Take 10 mg by mouth every other day. At 12 noon  . tiZANidine (ZANAFLEX) 4 MG tablet Take 1 tablet (4 mg total) by mouth every 6 (six) hours as needed for muscle spasms. (Patient not taking: Reported on 09/19/2020)   No facility-administered encounter medications on file as of 09/19/2020.     Objective: Blood pressure 106/71, pulse 60, temperature 98.7 F (37.1 C), temperature source Oral, height 5\' 3"  (1.6 m), weight 194 lb  3.2 oz (88.1 kg), last menstrual period 08/25/2012. Patient is alert and in no acute distress. She is wearing a mask. Conjunctiva is pink. Sclera is nonicteric Oropharyngeal mucosa is normal. No neck masses or thyromegaly noted. Cardiac exam with regular rhythm normal S1 and S2. No murmur or gallop noted. Lungs are clear to auscultation. Abdomen No LE edema or clubbing noted.   Assessment:  #1.  History of erosive reflux esophagitis.  Heartburn has been reasonably well controlled with pantoprazole and dietary measures.  Her renal function is decreasing.  Therefore it would  be appropriate to switch her from proton pump inhibitor to histamine 2 blocker and and hopefully it would control her GERD symptoms.  #2.  Atypical chest pain.  She seems to have retrosternal pain and now it is intrascapular.  She has had cardiac evaluation by Dr. Lucianne Lei.  She already has had her gallbladder removed.  I do not believe this pain is due to GERD.  It most likely is a musculoskeletal pain.  She certainly could have esophageal spasm but it is less likely as she is not having dysphagia.  Plan:  Discontinue pantoprazole. Begin famotidine 20 mg p.o. twice daily as needed. Patient advised to maintain good posture at work. Patient will call with progress report in 1 month or earlier if necessary. Office visit in 6 months.

## 2020-09-19 NOTE — Patient Instructions (Signed)
Notify if you have symptom frequency and severity increases with change in medication. Please call office with progress report in 1 month.

## 2020-09-20 ENCOUNTER — Other Ambulatory Visit (HOSPITAL_COMMUNITY): Payer: Self-pay | Admitting: Nephrology

## 2020-09-20 DIAGNOSIS — I5022 Chronic systolic (congestive) heart failure: Secondary | ICD-10-CM

## 2020-09-22 ENCOUNTER — Telehealth: Payer: Self-pay | Admitting: Cardiology

## 2020-09-22 NOTE — Telephone Encounter (Signed)
Pt was told by Dr. Gaston Islam that she had CHF and it was diagnosed on a test from 2017. She is very concerned about this because she's never been told she has CHF and would like to know if this is true.   Please call [phone]

## 2020-09-22 NOTE — Telephone Encounter (Signed)
I spoke with patient and discussed Dr.Branch's office note from 05/2020 and told her he has not diagnosed her CHF.She is diagnosed with HTN and palpitations. She does have echo scheduled on 10/05/20.

## 2020-09-26 ENCOUNTER — Other Ambulatory Visit: Payer: Self-pay

## 2020-09-26 ENCOUNTER — Ambulatory Visit (HOSPITAL_COMMUNITY)
Admission: RE | Admit: 2020-09-26 | Discharge: 2020-09-26 | Disposition: A | Discharge status: Home / Self Care | Payer: BC Managed Care – PPO | Source: Ambulatory Visit | Attending: Nephrology | Admitting: Nephrology

## 2020-09-26 DIAGNOSIS — I5022 Chronic systolic (congestive) heart failure: Secondary | ICD-10-CM | POA: Insufficient documentation

## 2020-09-26 DIAGNOSIS — Z79899 Other long term (current) drug therapy: Secondary | ICD-10-CM | POA: Insufficient documentation

## 2020-09-26 DIAGNOSIS — N1831 Chronic kidney disease, stage 3a: Secondary | ICD-10-CM | POA: Insufficient documentation

## 2020-09-26 DIAGNOSIS — E6609 Other obesity due to excess calories: Secondary | ICD-10-CM

## 2020-09-26 DIAGNOSIS — I129 Hypertensive chronic kidney disease with stage 1 through stage 4 chronic kidney disease, or unspecified chronic kidney disease: Secondary | ICD-10-CM | POA: Diagnosis present

## 2020-09-27 ENCOUNTER — Encounter: Payer: Self-pay | Admitting: Neurology

## 2020-09-27 ENCOUNTER — Ambulatory Visit: Payer: BC Managed Care – PPO | Admitting: Neurology

## 2020-09-27 VITALS — BP 127/80 | HR 64 | Ht 63.0 in | Wt 190.0 lb

## 2020-09-27 DIAGNOSIS — Z8679 Personal history of other diseases of the circulatory system: Secondary | ICD-10-CM

## 2020-09-27 DIAGNOSIS — R519 Headache, unspecified: Secondary | ICD-10-CM

## 2020-09-27 NOTE — Progress Notes (Signed)
Chief Complaint  Patient presents with  . Follow-up    New rm, alone, reports no changes, 1-2 headaches a week     HISTORICAL  [name] [name], seen in request by  I reviewed and summarized the referring note. PMH HTN,  HLD,  right ICA terminus aneurysm s/p clipping in 2013   Patient was following with Dr.penumalli in 2013 for headache. At that time, she complained of intermittent headache, right or left-sided, associated with neck pain, left shoulder pain, nausea, blurred vision, and dizziness. Headaches were proceeded by feeding of sore spots over the scalp. She took Tylenol for headache. However she had MRI and MRA at that time showed 45 mm terminal right ICA aneurysm. MRI C-spine showed minimal degenerative changes without significant spinal stenosis. She underwent aneurysm clipping by Dr. Cyndy Freeze and after that her headache was gone for several years.  Since 2019, her headache gradually coming back, her headache was very similar to previous headache, starting with lower neck pain, bilateral shoulder pain, up to the back of her head, either left or right-sided with neck tightness. Headaches are more pressure like, dull feeling, sometimes lasting 2-3 days. Tylenol works if takes early. For the last 2 weeks, headache seems getting worse. At that same time she had right hand cramping after overexerting the right hand in a party, as well as sometimes left arm tingling. Went to see PCP, had a stress test which was negative and also had MRI/MRA showed previous aneurysm clipping without change, however, also showed symmetric mild signal along bilateral corticospinal tracts, progressed from 2015. It reported this finding is nonspecific however may be seen associated with ALS. Due to this abnormal findings, patient was referred here for further evaluation.  She has history of hypertension, on Cardizem, hydralazine, and metoprolol. Recently her blood pressure was on the lower side and her PCP is  trying to taper off hydralazine. Today blood pressure 99/60. Questionable history of fibromyalgia. He is also on aspirin daily. Denies any alcohol smoking or using illicit drugs.  03/25/16 follow up- she has had some improvement with PT/OT. However, she still has intermittent mild HA at back of head and neck pain and tight. She sometimes has tenderness along the occipital nerve distribution. She did not start amitriptyline as she had confusion with dosing. She uses lidocaine cream PRN. She admitted that in the morning seems less symptoms and as the day goes by, she may feel more symptoms. She told me that she also has fibromyalgia. BP today 115/75.   07/02/16 follow up- pt has been doing well. HA intermittent but improved. Currently no HA. However, on 06/19/16, she was at work, suddenly she had one sharp pain at right side of face, lasted several min, and she also started to have right arm and right chest pain, BP up, she got up and walking out to hallway, about to pass out, her co-worker called EMS and she was sent to ED. At ED, her right facial pain gone, but started to have right dull HA, and still has some mild right arm and chest pain, EKG negative. She had MRI again demonstrated b/l corticospinal tract hyper T2 signal, unchanged. MRA neck negative but MRA head showed left supraclinoid ICA 60mm, concerning for small aneurysm. She followed with NSG Dr. Christella Noa and no intervention needed and will consider CTA head if needed. However, when comparing with her CTA head in 2014, it was unchanged. Pt came in today want to seek my opinion on that.   UPDATE  Mar 28 2020: She lost follow up since Feb 2019. Followed by her primary care physician for her headaches. It has happened a lot lately.  She complains of increased headache following her second shingles vaccination in September 2021, 4-5 times a week, Tylenol provide partial help, She also complains of bilateral shoulder pain, neck tension, often spreading  forward to become mild pressure headaches, She complains of difficulty sleeping, also complains of dry eyes, was seen by ophthalmologist recently, she denies focal signs.  I personally reviewed CT head with contrast in January 2018, no acute abnormality, prior right craniotomy and right aneurysm clipping  CT angiogram of the head in February 2019, no evidence of secondary aneurysm  Update September 27, 2020 SS: Headaches have always been right and left sided neck radiating forward, right now stage 3 CKD, is hesitant to try medications. She didn't take the nortriptyline, because things haven't been bad enough, careful with kidneys. In ER in August 03, 2020  For acute headache, BP was up, and creatinine was 1.31. Nothing since then. Current headaches have been tolerable, will try meditate, relax, doesn't even take medications. 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 Seeing [facility].  MRI of the brain 08/03/20 IMPRESSION: 1. No evidence of acute intracranial abnormality. 2. Similar postsurgical changes of right paraclinoid ICA clipping.  REVIEW OF SYSTEMS: Full 14 system review of systems performed and notable only for as above  See HPI ALLERGIES: Allergies  Allergen Reactions  . Ace Inhibitors Swelling  . Azithromycin Anaphylaxis, Shortness Of Breath and Swelling    Breathing difficulty, swelling  . Nsaids     Kidney disease  . Other Swelling, Other (See Comments) and Cough    Sneezing allergy to cats/grass/dust  . Statins Other (See Comments)    Cramping, muscle pain. Tolerates Crestor  Cramping, muscle pain Cramping, muscle pain. Tolerates Crestor   . Ciprofloxacin Nausea And Vomiting    Acute kidney failure   Also: kidney failure Acute kidney failure   . Medrol [Methylprednisolone] Palpitations    Heart racing, increased BP  . Tetracyclines & Related Palpitations    Heart racing, increased bp    HOME MEDICATIONS: Current Outpatient Medications  Medication Sig Dispense Refill   . acetaminophen (TYLENOL) 650 MG CR tablet Take 650 mg by mouth 2 (two) times daily as needed for pain.    [name] albuterol (VENTOLIN HFA) 108 (90 Base) MCG/ACT inhaler Inhale 1-2 puffs into the lungs every 4 (four) hours as needed for wheezing or shortness of breath.     [name] aspirin EC 81 MG tablet Take 81 mg by mouth daily at 12 noon.     . chlorthalidone (HYGROTON) 25 MG tablet Take 0.5 tablets (12.5 mg total) by mouth daily at 12 noon. 45 tablet 3  . Cholecalciferol (VITAMIN D) 50 MCG (2000 UT) CAPS Take 2,000 Units by mouth daily.    [name] diltiazem (CARDIZEM CD) 120 MG 24 hr capsule Take 120 mg by mouth at bedtime.     . famotidine (PEPCID) 20 MG tablet Take 1 tablet (20 mg total) by mouth 2 (two) times daily as needed for heartburn or indigestion. 60 tablet 5  . KLOR-CON M20 20 MEQ tablet Take 20 mEq by mouth at bedtime.     [name] loratadine (CLARITIN) 10 MG tablet Take 10 mg by mouth daily at 12 noon.    . Menthol-Methyl Salicylate (SALONPAS PAIN RELIEF PATCH EX) Apply 2-3 patches topically daily as needed (back / shoulder pain).    [name]  metoprolol tartrate (LOPRESSOR) 50 MG tablet Take 50 mg by mouth 2 (two) times daily.    . rosuvastatin (CRESTOR) 10 MG tablet Take 10 mg by mouth every other day. At 12 noon     No current facility-administered medications for this visit.    PAST MEDICAL HISTORY: Past Medical History:  Diagnosis Date  . Aneurysm ([place])    brain  . Chest pain    denied on 06/18/2012  . Chronic kidney disease    L - upper pole- defect, doesn't effect     . Family history of anesthesia complication    brother & neice "flat lined" during induction: neice d/t a medication given for a blood clotting problem; brother d/t "miscalculated amount of anesthesia"  . Fibromyalgia   . GERD (gastroesophageal reflux disease)    h/o colitis   . H. pylori infection    2012- stomach biopsy  . H/O hiatal hernia   . Headache(784.0)    using tylenol prn  . Hyperlipidemia   . Hypertension   .  Hypoglycemia   . Normal cardiac stress test    pt. released fr. Dr. [name] care in 10/2011, all findings w/i normal limits  . Overweight(278.02)    Obesity  . Palpitations   . Sinusitis, acute    seen by PCP- 06/17/2012, will treat /w augmentin & tussin/DM  . Uterine fibroid    pt. states that she has a "closed cervix"     PAST SURGICAL HISTORY: Past Surgical History:  Procedure Laterality Date  . BIOPSY  04/14/2019   Procedure: BIOPSY;  Surgeon: Rogene Houston, MD;  Location: [facility];  Service: Endoscopy;;  antrum  . CHOLECYSTECTOMY N/A 04/28/2019   Procedure: LAPAROSCOPIC CHOLECYSTECTOMY;  Surgeon: Aviva Signs, MD;  Location: [facility];  Service: General;  Laterality: N/A;  . COLONOSCOPY    . COLONOSCOPY N/A 11/26/2018   Procedure: COLONOSCOPY;  Surgeon: Rogene Houston, MD;  Location: [facility];  Service: Endoscopy;  Laterality: N/A;  1:00-12:00pm  . CRANIOTOMY Right 06/24/2012   Procedure: CRANIOTOMY INTRACRANIAL ANEURYSM FOR CAROTID;  Surgeon: [name], MD;  Location: [facility];  Service: Neurosurgery;  Laterality: Right;  RIGHT Pterional Craniotomy for aneurysm  . ESOPHAGOGASTRODUODENOSCOPY N/A 04/14/2019   Procedure: ESOPHAGOGASTRODUODENOSCOPY (EGD) WITH POSSIBLE ESOPHAGEAL DILATION;  Surgeon: Rogene Houston, MD;  Location: [facility];  Service: Endoscopy;  Laterality: N/A;  300pm  . LIPOMA EXCISION     buttock 10/2010    FAMILY HISTORY: Family History  Problem Relation Age of Onset  . Hypertension Other   . Coronary artery disease Other   . Dementia Mother   . Dementia Maternal Aunt     SOCIAL HISTORY: Social History   Socioeconomic History  . Marital status: Single    Spouse name: Not on file  . Number of children: Not on file  . Years of education: Not on file  . Highest education level: Not on file  Occupational History  . Occupation: Full time    Employer: [employer]  Tobacco Use  . Smoking status: Former Smoker     Packs/day: 0.80    Years: 15.00    Pack years: 12.00    Types: Cigarettes    Start date: 02/13/1989    Quit date: 04/28/2004    Years since quitting: 16.4  . Smokeless tobacco: Never Used  Vaping Use  . Vaping Use: Never used  Substance and Sexual Activity  . Alcohol use: No    Alcohol/week:  0.0 standard drinks  . Drug use: No  . Sexual activity: Never    Birth control/protection: None, Abstinence  Other Topics Concern  . Not on file  Social History Narrative   Single   Social Determinants of Health   Financial Resource Strain: Not on file  Food Insecurity: Not on file  Transportation Needs: Not on file  Physical Activity: Not on file  Stress: Not on file  Social Connections: Not on file  Intimate Partner Violence: Not on file   PHYSICAL EXAM   Vitals:   09/27/20 0934  BP: 127/80  Pulse: 64  Weight: 190 lb (86.2 kg)  Height: 5\' 3"  (1.6 m)   Not recorded     Body mass index is 33.66 kg/m.  Physical Exam  General: The patient is alert and cooperative at the time of the examination.  Skin: No significant peripheral edema is noted.  Neurologic Exam  Mental status: The patient is alert and oriented x 3 at the time of the examination. The patient has apparent normal recent and remote memory, with an apparently normal attention span and concentration ability.  Cranial nerves: Facial symmetry is present. Speech is normal, no aphasia or dysarthria is noted. Extraocular movements are full. Visual fields are full.  Motor: The patient has good strength in all 4 extremities.  Sensory examination: Soft touch sensation is symmetric on the face, arms, and legs.  Coordination: The patient has good finger-nose-finger and heel-to-shin bilaterally.  Gait and station: The patient has a normal gait.   Reflexes: Deep tendon reflexes are symmetric and normal  DIAGNOSTIC DATA (LABS, IMAGING, TESTING) - I reviewed patient records, labs, notes, testing and imaging myself  where available.  ASSESSMENT AND PLAN  [name] [name] is a 60 y.o. female   1. History of right internal carotid artery terminus aneurysm clipping 2. Frequent headaches with migraine features -Never started nortriptyline, does not desire preventative medication -Seeing nephrology, stage III kidney disease -For now, doing well off preventative medication, utilizing meditation for acute headache  -MRI of the brain in April 2022 was overall stable -Follow-up in 1 year or sooner if needed  Butler Denmark, Laqueta Jean, [facility] [address], [place], [place] [address] [phone]

## 2020-09-27 NOTE — Patient Instructions (Addendum)
Will hold off on medication for now  Call if headaches worsen  See you back as needed

## 2020-10-05 ENCOUNTER — Ambulatory Visit (HOSPITAL_COMMUNITY)
Admission: RE | Admit: 2020-10-05 | Discharge: 2020-10-05 | Disposition: A | Discharge status: Home / Self Care | Payer: BC Managed Care – PPO | Source: Ambulatory Visit | Attending: Nephrology | Admitting: Nephrology

## 2020-10-05 ENCOUNTER — Other Ambulatory Visit: Payer: Self-pay

## 2020-10-05 DIAGNOSIS — I5022 Chronic systolic (congestive) heart failure: Secondary | ICD-10-CM

## 2020-10-05 LAB — ECHOCARDIOGRAM COMPLETE
Area-P 1/2: 2.61 cm2
S' Lateral: 2.4 cm

## 2020-10-05 NOTE — Progress Notes (Signed)
*  PRELIMINARY RESULTS* Echocardiogram 2D Echocardiogram has been performed.  [name] [name] 10/05/2020, 2:06 PM

## 2020-11-28 ENCOUNTER — Ambulatory Visit: Payer: BC Managed Care – PPO | Admitting: Cardiology

## 2020-11-28 NOTE — Progress Notes (Deleted)
Clinical Summary [name] [name] is a 60 y.o.femaleseen today for follow up of the following medical problems.      1. Palpitations -  admit 03/2014 with palpitations and chest pain. On that day was hypertensive and tachycardic on admission. - MPI without evidence of ischemia, LVEF 78%. Echo "normal LVEF". TSH 1.4. Event monitor with occasional PACs, no significant arrhythmias  - holter 11/2016 no arrhythmias   - initially dilt stopped after recent admission with diverticulitis and hypotension. Since discharge bp's have increased, restarte back on '120mg'$  daily    - 3 episodes of palpitations she relates to stress since last visit -        2. HTN - hydralazine previously stopped due to concerns for possible drug induced lupus. Symptoms better.   - her dilt was prevoiusly decreased, with this significant elvation in bp's and she went back to takeing '180mg'$  bid - has been on fairly high dose dual av nodal agents due to history of chronic palpitations, has tolerated regimen.      - diltiazem stopped during Jan 2020 admission with acute diverticulitis due to low bp's. Had also had some prior low heart rates. Chlothalidone stopped as well - pcp restarted chlorthalidione and diltiazem for recurrent high bp's   - compliant with meds     3. Diverticulitis - admitted Jan 2020 with abdominal pain and diarrhea, CT with evidecne of possible diverticulitis - treated with abx   4. Chest pain - long history of atypical chest pain  -  she completed a lexiscan 03/2014  that was overall low risk, small apical defect probable artifact. - seen at [facility] 10/17 with chest pain. CT PE negative.  07/2015 nuclear probable apical thinning, cannot exlcude mild apical ischemia  - seen in ER 08/2017 with chest pain, worst with movement and cough. No evidence of ischemia by EKG or enzymes     05/31/2019 ER visit with epigastric/lower chest pain - from ER notes tender to palpation WBC 8.8 Hgb 13.4 K 3.1  Cr 1.17 hstrop 31-->26-->15 Ddimer 0.58 EKG SR no acute ishcemic changes - CT PE: no PE, no acute process - CT A/P no acute process   - recently had gallbladder removed , pain started about 1 day after - pressure epigastric and midchest, below sternum. Constant pain more than 10 hours. +tachycardia, elevated blood pressures. Pain in midback. - has had issues with constipiation after surgery.      11/10/19 seen in ER with back pain, some radiation into chest - from ER notes symptoms resolved with GI cocktail - trop neg x 2, Ddimer neg. EKG SR, no ischeimc changes   -chronic stable symptoms overall unchanged.                SH: mom with recent stroke, currently at [facility].   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 pylori infection    2012- stomach biopsy   H/O hiatal hernia    Headache(784.0)  using tylenol prn   Hyperlipidemia    Hypertension    Hypoglycemia    Normal cardiac stress test    pt. released fr. Dr. [name] care in 10/2011, all findings w/i normal limits   Overweight(278.02)    Obesity   Palpitations    Sinusitis, acute    seen by PCP- 06/17/2012, will treat /w augmentin & tussin/DM   Uterine fibroid    pt. states that she has a "closed cervix"      Allergies  Allergen Reactions   Ace Inhibitors Swelling   Azithromycin Anaphylaxis, Shortness Of Breath and Swelling    Breathing difficulty, swelling   Nsaids     Kidney disease   Other Swelling, Other (See Comments) and Cough    Sneezing allergy to cats/grass/dust   Statins Other  (See Comments)    Cramping, muscle pain. Tolerates Crestor  Cramping, muscle pain Cramping, muscle pain. Tolerates Crestor    Ciprofloxacin Nausea And Vomiting    Acute kidney failure   Also: kidney failure Acute kidney failure    Medrol [Methylprednisolone] Palpitations    Heart racing, increased BP   Tetracyclines & Related Palpitations    Heart racing, increased bp     Current Outpatient Medications  Medication Sig Dispense Refill   acetaminophen (TYLENOL) 650 MG CR tablet Take 650 mg by mouth 2 (two) times daily as needed for pain.     albuterol (VENTOLIN HFA) 108 (90 Base) MCG/ACT inhaler Inhale 1-2 puffs into the lungs every 4 (four) hours as needed for wheezing or shortness of breath.      aspirin EC 81 MG tablet Take 81 mg by mouth daily at 12 noon.      chlorthalidone (HYGROTON) 25 MG tablet Take 0.5 tablets (12.5 mg total) by mouth daily at 12 noon. 45 tablet 3   Cholecalciferol (VITAMIN D) 50 MCG (2000 UT) CAPS Take 2,000 Units by mouth daily.     diltiazem (CARDIZEM CD) 120 MG 24 hr capsule Take 120 mg by mouth at bedtime.      famotidine (PEPCID) 20 MG tablet Take 1 tablet (20 mg total) by mouth 2 (two) times daily as needed for heartburn or indigestion. 60 tablet 5   KLOR-CON M20 20 MEQ tablet Take 20 mEq by mouth at bedtime.      loratadine (CLARITIN) 10 MG tablet Take 10 mg by mouth daily at 12 noon.     Menthol-Methyl Salicylate (SALONPAS PAIN RELIEF PATCH EX) Apply 2-3 patches topically daily as needed (back / shoulder pain).     metoprolol tartrate (LOPRESSOR) 50 MG tablet Take 50 mg by mouth 2 (two) times daily.     rosuvastatin (CRESTOR) 10 MG tablet Take 10 mg by mouth every other day. At 12 noon     No current facility-administered medications for this visit.     Past Surgical History:  Procedure Laterality Date   BIOPSY  04/14/2019   Procedure: BIOPSY;  Surgeon: Rogene Houston, MD;  [facility]: [facility];  Service: Endoscopy;;  antrum    CHOLECYSTECTOMY N/A 04/28/2019   Procedure: LAPAROSCOPIC CHOLECYSTECTOMY;  Surgeon: Aviva Signs, MD;  Location: [facility];  Service: General;  Laterality: N/A;   COLONOSCOPY     COLONOSCOPY N/A 11/26/2018   Procedure: COLONOSCOPY;  Surgeon: Rogene Houston, MD;  Location: [facility];  Service: Endoscopy;  Laterality: N/A;  1:00-12:00pm   CRANIOTOMY Right 06/24/2012   Procedure: CRANIOTOMY INTRACRANIAL ANEURYSM FOR CAROTID;  Surgeon: [name], MD;  Location: [facility]  NEURO ORS;  Service: Neurosurgery;  Laterality: Right;  RIGHT Pterional Craniotomy for aneurysm   ESOPHAGOGASTRODUODENOSCOPY N/A 04/14/2019   Procedure: ESOPHAGOGASTRODUODENOSCOPY (EGD) WITH POSSIBLE ESOPHAGEAL DILATION;  Surgeon: Rogene Houston, MD;  [facility]: [facility];  Service: Endoscopy;  Laterality: N/A;  300pm   LIPOMA EXCISION     buttock 10/2010     Allergies  Allergen Reactions   Ace Inhibitors Swelling   Azithromycin Anaphylaxis, Shortness Of Breath and Swelling    Breathing difficulty, swelling   Nsaids     Kidney disease   Other Swelling, Other (See Comments) and Cough    Sneezing allergy to cats/grass/dust   Statins Other (See Comments)    Cramping, muscle pain. Tolerates Crestor  Cramping, muscle pain Cramping, muscle pain. Tolerates Crestor    Ciprofloxacin Nausea And Vomiting    Acute kidney failure   Also: kidney failure Acute kidney failure    Medrol [Methylprednisolone] Palpitations    Heart racing, increased BP   Tetracyclines & Related Palpitations    Heart racing, increased bp      Family History  Problem Relation Age of Onset   Hypertension Other    Coronary artery disease Other    Dementia Mother    Dementia Maternal Aunt      Social History [name] [name] reports that she quit smoking about 16 years ago. Her smoking use included cigarettes. She started smoking about 31 years ago. She has a 12.00 pack-year smoking history. She has never used smokeless tobacco. Ms. Melott  reports no history of alcohol use.   Review of Systems CONSTITUTIONAL: No weight loss, fever, chills, weakness or fatigue.  HEENT: Eyes: No visual loss, blurred vision, double vision or yellow sclerae.No hearing loss, sneezing, congestion, runny nose or sore throat.  SKIN: No rash or itching.  CARDIOVASCULAR:  RESPIRATORY: No shortness of breath, cough or sputum.  GASTROINTESTINAL: No anorexia, nausea, vomiting or diarrhea. No abdominal pain or blood.  GENITOURINARY: No burning on urination, no polyuria NEUROLOGICAL: No headache, dizziness, syncope, paralysis, ataxia, numbness or tingling in the extremities. No change in bowel or bladder control.  MUSCULOSKELETAL: No muscle, back pain, joint pain or stiffness.  LYMPHATICS: No enlarged nodes. No history of splenectomy.  PSYCHIATRIC: No history of depression or anxiety.  ENDOCRINOLOGIC: No reports of sweating, cold or heat intolerance. No polyuria or polydipsia.  [name]   Physical Examination There were no vitals filed for this visit. There were no vitals filed for this visit.  Gen: resting comfortably, no acute distress HEENT: no scleral icterus, pupils equal round and reactive, no palptable cervical adenopathy,  CV Resp: Clear to auscultation bilaterally GI: abdomen is soft, non-tender, non-distended, normal bowel sounds, no hepatosplenomegaly MSK: extremities are warm, no edema.  Skin: warm, no rash Neuro:  no focal deficits Psych: appropriate affect   Diagnostic Studies  05/2009 Nuclear stress No ischemia   03/2014 Echo Study Conclusions  - Left ventricle: The cavity size was normal. Systolic function was   normal. Wall motion was normal; there were no regional wall   motion abnormalities.   03/2014 Lexiscan MPI IMPRESSION: 1. No reversible ischemia or infarction.   2. Normal left ventricular wall motion.   3. Left ventricular ejection fraction 78%   4. Low-risk stress test findings*.   03/2014 Event  Monitor Symptoms correlate with NSR. One episode of fluttering showed 2 PACs. No significant arrhythmias   07/2015 Lexiscan MPI There was no ST segment deviation noted during stress. This is a low risk study.  The left ventricular ejection fraction is mildly decreased (45-54%). LVEF is 49% just slightly below normal, visually appears normal. Consider correlating with echo. There is a small mild intensity distal anterior and apical defect with mild reversibility. The apex has normal wall motion. Finding is likely secondary to apical thinning, there is also significant radiotracer uptake in the adjacent gut which may create some artifact. Cannot rule would mild apical ischemia. Overall findings are low risk.       11/2016 holter Min HR 42, Max HR 94, Avg HR 57 No supraventricular or ventricular ectopy Telemetry tracings show sinus bradycardia and sinus rhythm No symptoms reported No significant arrhythmias   09/2020 echo IMPRESSIONS     1. Left ventricular ejection fraction, by estimation, is 65 to 70%. The  left ventricle has normal function. The left ventricle has no regional  wall motion abnormalities. Left ventricular diastolic parameters are  indeterminate.   2. Right ventricular systolic function is normal. The right ventricular  size is normal. There is normal pulmonary artery systolic pressure. The  estimated right ventricular systolic pressure is 0000000 mmHg.   3. The mitral valve is grossly normal. Trivial mitral valve  regurgitation.   4. The aortic valve is tricuspid. Aortic valve regurgitation is not  visualized.   5. The inferior vena cava is normal in size with greater than 50%  respiratory variability, suggesting right atrial pressure of 3 mmHg.   Assessment and Plan   1.Chest pain - long history of symptoms with negative prior cardiac workups - no significant change in symptoms, continue to monitor.    2. Palpitations - infrequent symptoms, continue current meds      3. HTN - bp at goal, continue current meds     Arnoldo Lenis, M.D., F.A.C.C.

## 2020-11-30 ENCOUNTER — Encounter: Payer: Self-pay | Admitting: Cardiology

## 2020-12-11 ENCOUNTER — Other Ambulatory Visit: Payer: Self-pay

## 2020-12-11 ENCOUNTER — Ambulatory Visit: Payer: BC Managed Care – PPO | Admitting: Cardiology

## 2020-12-11 ENCOUNTER — Encounter: Payer: Self-pay | Admitting: Cardiology

## 2020-12-11 VITALS — BP 144/70 | HR 60 | Ht 63.0 in | Wt 195.0 lb

## 2020-12-11 DIAGNOSIS — R0789 Other chest pain: Secondary | ICD-10-CM | POA: Diagnosis not present

## 2020-12-11 DIAGNOSIS — R002 Palpitations: Secondary | ICD-10-CM

## 2020-12-11 DIAGNOSIS — I10 Essential (primary) hypertension: Secondary | ICD-10-CM | POA: Diagnosis not present

## 2020-12-11 NOTE — Patient Instructions (Addendum)
Medication Instructions:  Continue all current medications.   Labwork: none  Testing/Procedures: none  Follow-Up: 6 months   Any Other Special Instructions Will Be Listed Below (If Applicable).   If you need a refill on your cardiac medications before your next appointment, please call your pharmacy.  

## 2020-12-11 NOTE — Progress Notes (Signed)
Clinical Summary [name] is a 60 y.o.female seen today for follow up of the following medical problems.      1. Palpitations -  admit 03/2014 with palpitations and chest pain. On that day was hypertensive and tachycardic on admission. - MPI without evidence of ischemia, LVEF 78%. Echo "normal LVEF". TSH 1.4. Event monitor with occasional PACs, no significant arrhythmias  - holter 11/2016 no arrhythmias   - initially dilt stopped after recent admission with diverticulitis and hypotension. Since discharge bp's have increased, restarte back on '120mg'$  daily    - still palpitations at times. Over 3 months has had 4 episodes, 2 episodes awoke her from sleep.   - limiting caffeine, no EtoH -      2. HTN - hydralazine previously stopped due to concerns for possible drug induced lupus. Symptoms better.   - her dilt was prevoiusly decreased, with this significant elvation in bp's and she went back to takeing '180mg'$  bid - has been on fairly high dose dual av nodal agents due to history of chronic palpitations, has tolerated regimen.      - diltiazem stopped during Jan 2020 admission with acute diverticulitis due to low bp's. Had also had some prior low heart rates. Chlothalidone stopped as well - pcp restarted chlorthalidione and diltiazem for recurrent high bp's   - compliant with meds  - home bp's 120s/70s. HR ususlaly in 50s to 60s     3. Diverticulitis - admitted Jan 2020 with abdominal pain and diarrhea, CT with evidecne of possible diverticulitis - treated with abx   4. Chest pain - long history of atypical chest pain  -  she completed a lexiscan 03/2014  that was overall low risk, small apical defect probable artifact. - seen at [facility] 10/17 with chest pain. CT PE negative.   07/2015 nuclear probable apical thinning, cannot exlcude mild apical ischemia  - seen in ER 08/2017 with chest pain, worst with movement and cough. No evidence of ischemia by EKG or enzymes      05/31/2019 ER visit with epigastric/lower chest pain - from ER notes tender to palpation WBC 8.8 Hgb 13.4 K 3.1 Cr 1.17 hstrop 31-->26-->15 Ddimer 0.58  EKG SR no acute ishcemic changes - CT PE: no PE, no acute process - CT A/P no acute process   - recently had gallbladder removed , pain started about 1 day after - pressure epigastric and midchest, below sternum. Constant pain more than 10 hours. +tachycardia, elevated blood pressures. Pain in midback.  - has had issues with constipiation after surgery.      11/10/19 seen in ER with back pain, some radiation into chest - from ER notes symptoms resolved with GI cocktail - trop neg x 2, Ddimer neg. EKG SR, no ischeimc changes       09/2020 echo: LVEF 65-70%, no WMAs, indet diastolic function, normal RV  - chronic stable symptoms         SH: mom with recent stroke, currently at [facility]. Mother has come home and living with her, ongoing weakness on left side. Also has Alzheimers.    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 pylori infection    2012- stomach biopsy   H/O hiatal hernia    Headache(784.0)    using tylenol prn   Hyperlipidemia    Hypertension    Hypoglycemia    Normal cardiac stress test    pt. released fr. Dr. [name] care in 10/2011, all findings w/i normal limits   Overweight(278.02)    Obesity   Palpitations    Sinusitis, acute    seen by PCP- 06/17/2012, will treat /w augmentin & tussin/DM   Uterine fibroid    pt. states that she has a "closed cervix"      Allergies  Allergen  Reactions   Ace Inhibitors Swelling   Azithromycin Anaphylaxis, Shortness Of Breath and Swelling    Breathing difficulty, swelling   Nsaids     Kidney disease   Other Swelling, Other (See Comments) and Cough    Sneezing allergy to cats/grass/dust   Statins Other (See Comments)    Cramping, muscle pain. Tolerates Crestor  Cramping, muscle pain Cramping, muscle pain. Tolerates Crestor    Ciprofloxacin Nausea And Vomiting    Acute kidney failure   Also: kidney failure Acute kidney failure    Medrol [Methylprednisolone] Palpitations    Heart racing, increased BP   Tetracyclines & Related Palpitations    Heart racing, increased bp     Current Outpatient Medications  Medication Sig Dispense Refill   acetaminophen (TYLENOL) 650 MG CR tablet Take 650 mg by mouth 2 (two) times daily as needed for pain.     albuterol (VENTOLIN HFA) 108 (90 Base) MCG/ACT inhaler Inhale 1-2 puffs into the lungs every 4 (four) hours as needed for wheezing or shortness of breath.      aspirin EC 81 MG tablet Take 81 mg by mouth daily at 12 noon.      chlorthalidone (HYGROTON) 25 MG tablet Take 0.5 tablets (12.5 mg total) by mouth daily at 12 noon. 45 tablet 3   Cholecalciferol (VITAMIN D) 50 MCG (2000 UT) CAPS Take 2,000 Units by mouth daily.     diltiazem (CARDIZEM CD) 120 MG 24 hr capsule Take 120 mg by mouth at bedtime.      famotidine (PEPCID) 20 MG tablet Take 1 tablet (20 mg total) by mouth 2 (two) times daily as needed for heartburn or indigestion. 60 tablet 5   KLOR-CON M20 20 MEQ tablet Take 20 mEq by mouth at bedtime.      loratadine (CLARITIN) 10 MG tablet Take 10 mg by mouth daily at 12 noon.     Menthol-Methyl Salicylate (SALONPAS PAIN RELIEF PATCH EX) Apply 2-3 patches topically daily as needed (back / shoulder pain).     metoprolol tartrate (LOPRESSOR) 50 MG tablet Take 50 mg by mouth 2 (two) times daily.     rosuvastatin (CRESTOR) 10 MG tablet Take 10 mg by mouth every other day. At 12 noon      No current facility-administered medications for this visit.     Past Surgical History:  Procedure Laterality Date   BIOPSY  04/14/2019   Procedure: BIOPSY;  Surgeon: Rogene Houston, MD;  [facility];  Service: Endoscopy;;  antrum   CHOLECYSTECTOMY N/A 04/28/2019   Procedure: LAPAROSCOPIC CHOLECYSTECTOMY;  Surgeon: Aviva Signs, MD;  Location: [facility];  Service: General;  Laterality: N/A;   COLONOSCOPY  COLONOSCOPY N/A 11/26/2018   Procedure: COLONOSCOPY;  Surgeon: Rogene Houston, MD;  [facility];  Service: Endoscopy;  Laterality: N/A;  1:00-12:00pm   CRANIOTOMY Right 06/24/2012   Procedure: CRANIOTOMY INTRACRANIAL ANEURYSM FOR CAROTID;  Surgeon: [name], MD;  Location: [place] [facility]UR[facility] ORS;  Service: Neurosurgery;  Laterality: Right;  RIGHT Pterional Craniotomy for aneurysm   ESOPHAGOGASTRODUODENOSCOPY N/A 04/14/2019   Procedure: ESOPHAGOGASTRODUODENOSCOPY (EGD) WITH POSSIBLE ESOPHAGEAL DILATION;  Surgeon: Rogene Houston, MD;  [facility];  Service: Endoscopy;  Laterality: N/A;  300pm   LIPOMA EXCISION     buttock 10/2010     Allergies  Allergen Reactions   Ace Inhibitors Swelling   Azithromycin Anaphylaxis, Shortness Of Breath and Swelling    Breathing difficulty, swelling   Nsaids     Kidney disease   Other Swelling, Other (See Comments) and Cough    Sneezing allergy to cats/grass/dust   Statins Other (See Comments)    Cramping, muscle pain. Tolerates Crestor  Cramping, muscle pain Cramping, muscle pain. Tolerates Crestor    Ciprofloxacin Nausea And Vomiting    Acute kidney failure   Also: kidney failure Acute kidney failure    Medrol [Methylprednisolone] Palpitations    Heart racing, increased BP   Tetracyclines & Related Palpitations    Heart racing, increased bp      Family History  Problem Relation Age of Onset   Hypertension Other    Coronary artery disease Other    Dementia Mother    Dementia Maternal Aunt       Social History [name] reports that she quit smoking about 16 years ago. Her smoking use included cigarettes. She started smoking about 31 years ago. She has a 12.00 pack-year smoking history. She has never used smokeless tobacco. Ms. Zogg reports no history of alcohol use.   Review of Systems CONSTITUTIONAL: No weight loss, fever, chills, weakness or fatigue.  HEENT: Eyes: No visual loss, blurred vision, double vision or yellow sclerae.No hearing loss, sneezing, congestion, runny nose or sore throat.  SKIN: No rash or itching.  CARDIOVASCULAR: per hpi RESPIRATORY: No shortness of breath, cough or sputum.  GASTROINTESTINAL: No anorexia, nausea, vomiting or diarrhea. No abdominal pain or blood.  GENITOURINARY: No burning on urination, no polyuria NEUROLOGICAL: No headache, dizziness, syncope, paralysis, ataxia, numbness or tingling in the extremities. No change in bowel or bladder control.  MUSCULOSKELETAL: No muscle, back pain, joint pain or stiffness.  LYMPHATICS: No enlarged nodes. No history of splenectomy.  PSYCHIATRIC: No history of depression or anxiety.  ENDOCRINOLOGIC: No reports of sweating, cold or heat intolerance. No polyuria or polydipsia.  [name]   Physical Examination Today's Vitals   12/11/20 0912  BP: (!) 144/70  Pulse: 60  Weight: 195 lb (88.5 kg)  Height: '5\' 3"'$  (1.6 m)   Body mass index is 34.54 kg/m.  Gen: resting comfortably, no acute distress HEENT: no scleral icterus, pupils equal round and reactive, no palptable cervical adenopathy,  CV: RRR, no mr/g, no jvd Resp: Clear to auscultation bilaterally GI: abdomen is soft, non-tender, non-distended, normal bowel sounds, no hepatosplenomegaly MSK: extremities are warm, no edema.  Skin: warm, no rash Neuro:  no focal deficits Psych: appropriate affect   Diagnostic Studies 05/2009 Nuclear stress No ischemia   03/2014 Echo Study Conclusions  - Left ventricle: The cavity size was normal.  Systolic function was   normal. Wall motion was normal; there were no regional wall   motion abnormalities.   03/2014 Lexiscan  MPI IMPRESSION: 1. No reversible ischemia or infarction.   2. Normal left ventricular wall motion.   3. Left ventricular ejection fraction 78%   4. Low-risk stress test findings*.   03/2014 Event Monitor Symptoms correlate with NSR. One episode of fluttering showed 2 PACs. No significant arrhythmias   07/2015 Lexiscan MPI There was no ST segment deviation noted during stress. This is a low risk study. The left ventricular ejection fraction is mildly decreased (45-54%). LVEF is 49% just slightly below normal, visually appears normal. Consider correlating with echo. There is a small mild intensity distal anterior and apical defect with mild reversibility. The apex has normal wall motion. Finding is likely secondary to apical thinning, there is also significant radiotracer uptake in the adjacent gut which may create some artifact. Cannot rule would mild apical ischemia. Overall findings are low risk.       11/2016 holter Min HR 42, Max HR 94, Avg HR 57 No supraventricular or ventricular ectopy Telemetry tracings show sinus bradycardia and sinus rhythm No symptoms reported No significant arrhythmias      Assessment and Plan   1.Chest pain - long history of symptoms with negative prior cardiac workups - chronic stable symptoms, continue to monitor - EKG today shows NSR   2. Palpitations - overall infrequnt symptoms, essentially on highest dosing of av nodal agents she can tolerate based on HRs. Prior monitor was benign, continue current meds     3. HTN -home bp's overlal at goal, continue current meds   F/u 6 months     Arnoldo Lenis, M.D.

## 2020-12-22 ENCOUNTER — Telehealth (INDEPENDENT_AMBULATORY_CARE_PROVIDER_SITE_OTHER): Payer: Self-pay | Admitting: *Deleted

## 2020-12-22 NOTE — Telephone Encounter (Signed)
Pt left on vm that she ate a fruit platter 3 days ago then the next day started having nausea and diarrhea. Vomited one time that day none since. Had fever that first day. Highest 100.5. states she feels much better. No fever for 2 days, no abdominal pain, eating a bland diet, keeping liquids down, no blood in stool, no dark stools, no mucus in stool. Called because she was concerned her stool was green today. She did take pepto that helped with diarrhea. States diarrhea is only when she eats, drinking fluids good, no more nausea since first day. Feeling better. Wanted to know if green stool was ok. Advised it was ok and should go back to normal if not let us know. Also call pcp if having fever, vomiting, frequent diarrhea, blood in stool, dark stools, or mucus in stool, abdominal pain, or dizziness. Pt verbalized understanding.

## 2021-03-20 ENCOUNTER — Other Ambulatory Visit (INDEPENDENT_AMBULATORY_CARE_PROVIDER_SITE_OTHER): Payer: Self-pay

## 2021-03-20 ENCOUNTER — Other Ambulatory Visit (INDEPENDENT_AMBULATORY_CARE_PROVIDER_SITE_OTHER): Payer: Self-pay | Admitting: Internal Medicine

## 2021-03-27 ENCOUNTER — Encounter (INDEPENDENT_AMBULATORY_CARE_PROVIDER_SITE_OTHER): Payer: Self-pay | Admitting: Internal Medicine

## 2021-03-27 ENCOUNTER — Ambulatory Visit (INDEPENDENT_AMBULATORY_CARE_PROVIDER_SITE_OTHER): Payer: BC Managed Care – PPO | Admitting: Internal Medicine

## 2021-06-19 ENCOUNTER — Encounter: Payer: Self-pay | Admitting: Cardiology

## 2021-06-19 ENCOUNTER — Ambulatory Visit: Payer: 59 | Admitting: Cardiology

## 2021-06-19 VITALS — BP 120/82 | HR 68 | Ht 63.0 in | Wt 199.6 lb

## 2021-06-19 DIAGNOSIS — R002 Palpitations: Secondary | ICD-10-CM

## 2021-06-19 DIAGNOSIS — I10 Essential (primary) hypertension: Secondary | ICD-10-CM | POA: Diagnosis not present

## 2021-06-19 DIAGNOSIS — E782 Mixed hyperlipidemia: Secondary | ICD-10-CM

## 2021-06-19 DIAGNOSIS — R0789 Other chest pain: Secondary | ICD-10-CM | POA: Diagnosis not present

## 2021-06-19 NOTE — Progress Notes (Signed)
Clinical Summary [name] [name] is a 61 y.o.female seen today for follow up of the following medical problems.      1. Palpitations -  admit 03/2014 with palpitations and chest pain. On that day was hypertensive and tachycardic on admission. - MPI without evidence of ischemia, LVEF 78%. Echo "normal LVEF". TSH 1.4. Event monitor with occasional PACs, no significant arrhythmias  - holter 11/2016 no arrhythmias  - symptoms at times, overall tolerable. Av nodal agents have been limited by low normal HRs at times 50s to 60s     2. HTN - hydralazine previously stopped due to concerns for possible drug induced lupus. Symptoms better.   - her dilt was prevoiusly decreased, with this significant elvation in bp's and she went back to takeing 180mg  bid - has been on fairly high dose dual av nodal agents due to history of chronic palpitations, has tolerated regimen.      - diltiazem stopped during Jan 2020 admission with acute diverticulitis due to low bp's. Had also had some prior low heart rates. Chlothalidone stopped as well - pcp restarted chlorthalidione and diltiazem for recurrent high bp's     - home bp's usually around 110s-120s/70s-80s   3. Diverticulitis - admitted Jan 2020 with abdominal pain and diarrhea, CT with evidecne of possible diverticulitis - treated with abx   4. Chest pain - long history of atypical chest pain  -  she completed a lexiscan 03/2014  that was overall low risk, small apical defect probable artifact. - seen at [facility] 10/17 with chest pain. CT PE negative.   07/2015 nuclear probable apical thinning, cannot exlcude mild apical ischemia  - seen in ER 08/2017 with chest pain, worst with movement and cough. No evidence of ischemia by EKG or enzymes     05/31/2019 ER visit with epigastric/lower chest pain - from ER notes tender to palpation WBC 8.8 Hgb 13.4 K 3.1 Cr 1.17 hstrop 31-->26-->15 Ddimer 0.58  EKG SR no acute ishcemic changes - CT PE: no PE, no  acute process - CT A/P no acute process   - recently had gallbladder removed , pain started about 1 day after - pressure epigastric and midchest, below sternum. Constant pain more than 10 hours. +tachycardia, elevated blood pressures. Pain in midback.  - has had issues with constipiation after surgery.      11/10/19 seen in ER with back pain, some radiation into chest - from ER notes symptoms resolved with GI cocktail - trop neg x 2, Ddimer neg. EKG SR, no ischeimc changes       09/2020 echo: LVEF 65-70%, no WMAs, indet diastolic function, normal RV  - chronic stable symptoms. Can be tender to palpation      5. Hyperlpidemia - labs followed by pcp - she is on crestor   SH: mom with recent stroke, currently at [facility]. Mother has come home and living with her, ongoing weakness on left side. Also has Alzheimers. Just turned 38, she is hospice.    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 pylori infection    2012- stomach biopsy   H/O hiatal hernia    Headache(784.0)    using tylenol prn   Hyperlipidemia    Hypertension    Hypoglycemia    Normal cardiac stress test    pt. released fr. Dr. [name] care in 10/2011, all findings w/i normal limits   Overweight(278.02)    Obesity   Palpitations    Sinusitis, acute    seen by PCP- 06/17/2012, will treat /w augmentin & tussin/DM   Uterine fibroid    pt. states that she has a "closed cervix"      Allergies  Allergen Reactions   Ace Inhibitors Swelling   Azithromycin Anaphylaxis, Shortness Of Breath and  Swelling    Breathing difficulty, swelling   Nsaids     Kidney disease   Other Swelling, Other (See Comments) and Cough    Sneezing allergy to cats/grass/dust   Statins Other (See Comments)    Cramping, muscle pain. Tolerates Crestor  Cramping, muscle pain Cramping, muscle pain. Tolerates Crestor    Ciprofloxacin Nausea And Vomiting    Acute kidney failure   Also: kidney failure Acute kidney failure    Medrol [Methylprednisolone] Palpitations    Heart racing, increased BP   Tetracyclines & Related Palpitations    Heart racing, increased bp     Current Outpatient Medications  Medication Sig Dispense Refill   acetaminophen (TYLENOL) 650 MG CR tablet Take 650 mg by mouth 2 (two) times daily as needed for pain.     albuterol (VENTOLIN HFA) 108 (90 Base) MCG/ACT inhaler Inhale 1-2 puffs into the lungs every 4 (four) hours as needed for wheezing or shortness of breath.      aspirin EC 81 MG tablet Take 81 mg by mouth daily at 12 noon.      chlorthalidone (HYGROTON) 25 MG tablet Take 0.5 tablets (12.5 mg total) by mouth daily at 12 noon. 45 tablet 3   Cholecalciferol (VITAMIN D) 50 MCG (2000 UT) CAPS Take 2,000 Units by mouth daily.     diltiazem (CARDIZEM CD) 120 MG 24 hr capsule Take 120 mg by mouth at bedtime.      famotidine (PEPCID) 20 MG tablet TAKE 1 TABLET (20 MG TOTAL) BY MOUTH 2 (TWO) TIMES DAILY AS NEEDED FOR HEARTBURN OR INDIGESTION. 180 tablet 1   KLOR-CON M20 20 MEQ tablet Take 20 mEq by mouth at bedtime.      loratadine (CLARITIN) 10 MG tablet Take 10 mg by mouth daily at 12 noon.     Menthol-Methyl Salicylate (SALONPAS PAIN RELIEF PATCH EX) Apply 2-3 patches topically daily as needed (back / shoulder pain).     metoprolol tartrate (LOPRESSOR) 50 MG tablet Take 50 mg by mouth 2 (two) times daily.     rosuvastatin (CRESTOR) 10 MG tablet Take 10 mg by mouth every other day. At 12 noon     No current facility-administered medications for this visit.     Past Surgical  History:  Procedure Laterality Date   BIOPSY  04/14/2019   Procedure: BIOPSY;  Surgeon: Rogene Houston, MD;  Location: [facility];  Service: Endoscopy;;  antrum   CHOLECYSTECTOMY N/A 04/28/2019   Procedure: LAPAROSCOPIC CHOLECYSTECTOMY;  Surgeon: Aviva Signs, MD;  Location: [facility];  Service: General;  Laterality: N/A;   COLONOSCOPY     COLONOSCOPY N/A 11/26/2018   Procedure: COLONOSCOPY;  Surgeon: Rogene Houston, MD;  Location:  [facility];  Service: Endoscopy;  Laterality: N/A;  1:00-12:00pm   CRANIOTOMY Right 06/24/2012   Procedure: CRANIOTOMY INTRACRANIAL ANEURYSM FOR CAROTID;  Surgeon: [name], MD;  Location: [place] [facility] [facility];  Service: Neurosurgery;  Laterality: Right;  RIGHT Pterional Craniotomy for aneurysm   ESOPHAGOGASTRODUODENOSCOPY N/A 04/14/2019   Procedure: ESOPHAGOGASTRODUODENOSCOPY (EGD) WITH POSSIBLE ESOPHAGEAL DILATION;  Surgeon: Rogene Houston, MD;  Location: [facility];  Service: Endoscopy;  Laterality: N/A;  300pm   LIPOMA EXCISION     buttock 10/2010     Allergies  Allergen Reactions   Ace Inhibitors Swelling   Azithromycin Anaphylaxis, Shortness Of Breath and Swelling    Breathing difficulty, swelling   Nsaids     Kidney disease   Other Swelling, Other (See Comments) and Cough    Sneezing allergy to cats/grass/dust   Statins Other (See Comments)    Cramping, muscle pain. Tolerates Crestor  Cramping, muscle pain Cramping, muscle pain. Tolerates Crestor    Ciprofloxacin Nausea And Vomiting    Acute kidney failure   Also: kidney failure Acute kidney failure    Medrol [Methylprednisolone] Palpitations    Heart racing, increased BP   Tetracyclines & Related Palpitations    Heart racing, increased bp      Family History  Problem Relation Age of Onset   Hypertension Other    Coronary artery disease Other    Dementia Mother    Dementia Maternal Aunt      Social History [name] [name] reports that she quit smoking about 17 years ago.  Her smoking use included cigarettes. She started smoking about 32 years ago. She has a 12.00 pack-year smoking history. She has never used smokeless tobacco. Ms. Escareno reports no history of alcohol use.   Review of Systems CONSTITUTIONAL: No weight loss, fever, chills, weakness or fatigue.  HEENT: Eyes: No visual loss, blurred vision, double vision or yellow sclerae.No hearing loss, sneezing, congestion, runny nose or sore throat.  SKIN: No rash or itching.  CARDIOVASCULAR: per hpi RESPIRATORY: No shortness of breath, cough or sputum.  GASTROINTESTINAL: No anorexia, nausea, vomiting or diarrhea. No abdominal pain or blood.  GENITOURINARY: No burning on urination, no polyuria NEUROLOGICAL: No headache, dizziness, syncope, paralysis, ataxia, numbness or tingling in the extremities. No change in bowel or bladder control.  MUSCULOSKELETAL: No muscle, back pain, joint pain or stiffness.  LYMPHATICS: No enlarged nodes. No history of splenectomy.  PSYCHIATRIC: No history of depression or anxiety.  ENDOCRINOLOGIC: No reports of sweating, cold or heat intolerance. No polyuria or polydipsia.  [name]   Physical Examination Today's Vitals   06/19/21 1536  BP: 120/82  Pulse: 68  SpO2: 98%  Weight: 199 lb 9.6 oz (90.5 kg)  Height: 5\' 3"  (1.6 m)   Body mass index is 35.36 kg/m.  Gen: resting comfortably, no acute distress HEENT: no scleral icterus, pupils equal round and reactive, no palptable cervical adenopathy,  CV: RRR, no m/r/g no jvd Resp: Clear to auscultation bilaterally GI: abdomen is soft, non-tender, non-distended, normal bowel sounds, no hepatosplenomegaly MSK: extremities are warm, no edema.  Skin: warm, no rash Neuro:  no focal deficits Psych: appropriate affect   Diagnostic Studies  05/2009 Nuclear stress No ischemia   03/2014 Echo Study Conclusions  - Left ventricle: The cavity size was normal. Systolic function was   normal. Wall motion was normal; there were no  regional wall   motion abnormalities.   03/2014 Lexiscan MPI IMPRESSION: 1. No reversible ischemia or infarction.  2. Normal left ventricular wall motion.   3. Left ventricular ejection fraction 78%   4. Low-risk stress test findings*.   03/2014 Event Monitor Symptoms correlate with NSR. One episode of fluttering showed 2 PACs. No significant arrhythmias   07/2015 Lexiscan MPI There was no ST segment deviation noted during stress. This is a low risk study. The left ventricular ejection fraction is mildly decreased (45-54%). LVEF is 49% just slightly below normal, visually appears normal. Consider correlating with echo. There is a small mild intensity distal anterior and apical defect with mild reversibility. The apex has normal wall motion. Finding is likely secondary to apical thinning, there is also significant radiotracer uptake in the adjacent gut which may create some artifact. Cannot rule would mild apical ischemia. Overall findings are low risk.       11/2016 holter Min HR 42, Max HR 94, Avg HR 57 No supraventricular or ventricular ectopy Telemetry tracings show sinus bradycardia and sinus rhythm No symptoms reported No significant arrhythmias         Assessment and Plan   1.Chest pain - long history of symptoms with negative prior cardiac workups - chronic symptoms, atypical recently in the fact can be tender to palpation - continue to monitor.    2. Palpitations - tolerable fairly mild symptoms. AV nodal agent dosing limited due to low HRs at times 50s to 60s - if progression of symptoms would repeat monitor, last study 5 years ago was benign.      3. HTN -at goal, continue current meds  4. Hyperlipidemia - request pcp labs, continue statin.     Arnoldo Lenis, M.D.

## 2021-06-19 NOTE — Patient Instructions (Signed)
Medication Instructions:  °Your physician recommends that you continue on your current medications as directed. Please refer to the Current Medication list given to you today.  ° °Labwork: °none ° °Testing/Procedures: °none ° °Follow-Up: °Your physician recommends that you schedule a follow-up appointment in: 6 month follow up ° °Any Other Special Instructions Will Be Listed Below (If Applicable). ° °If you need a refill on your cardiac medications before your next appointment, please call your pharmacy.  °

## 2021-06-21 ENCOUNTER — Encounter: Payer: Self-pay | Admitting: *Deleted

## 2021-08-16 ENCOUNTER — Telehealth: Payer: Self-pay | Admitting: Neurology

## 2021-08-16 NOTE — Telephone Encounter (Signed)
VM box full, sent mychart message informing pt of r/s needed for 6/1 appt- Judson Roch out of the office. ?

## 2021-08-28 ENCOUNTER — Emergency Department (HOSPITAL_COMMUNITY): Admission: EM | Admit: 2021-08-28 | Discharge: 2021-08-28 | Discharge status: Against Medical Advice | Payer: 59

## 2021-08-28 NOTE — ED Notes (Signed)
Pt called in lobby and no answer. Registration states pt left.  ?

## 2021-09-27 ENCOUNTER — Ambulatory Visit: Payer: BC Managed Care – PPO | Admitting: Neurology

## 2021-11-06 ENCOUNTER — Encounter: Payer: Self-pay | Admitting: Neurology

## 2021-11-06 ENCOUNTER — Ambulatory Visit: Payer: 59 | Admitting: Neurology

## 2021-11-06 VITALS — BP 121/77 | HR 60 | Ht 63.0 in | Wt 188.0 lb

## 2021-11-06 DIAGNOSIS — I671 Cerebral aneurysm, nonruptured: Secondary | ICD-10-CM

## 2021-11-06 DIAGNOSIS — R519 Headache, unspecified: Secondary | ICD-10-CM

## 2021-11-06 NOTE — Progress Notes (Signed)
Chief Complaint  Patient presents with   Follow-up    Room 18,  Reports Right sided headache yesterday, went to ED, c/o dull headache today    HISTORICAL  [name] [name], seen in request by  I reviewed and summarized the referring note. PMH HTN,  HLD,  right ICA terminus aneurysm s/p clipping in 2013   Patient was following with Dr.penumalli in 2013 for headache. At that time, she complained of intermittent headache, right or left-sided, associated with neck pain, left shoulder pain, nausea, blurred vision, and dizziness. Headaches were proceeded by feeding of sore spots over the scalp. She took Tylenol for headache. However she had MRI and MRA at that time showed 45 mm terminal right ICA aneurysm. MRI C-spine showed minimal degenerative changes without significant spinal stenosis. She underwent aneurysm clipping by Dr. Cyndy Freeze and after that her headache was gone for several years.   Since 2019, her headache gradually coming back, her headache was very similar to previous headache, starting with lower neck pain, bilateral shoulder pain, up to the back of her head, either left or right-sided with neck tightness. Headaches are more pressure like, dull feeling, sometimes lasting 2-3 days. Tylenol works if takes early. For the last 2 weeks, headache seems getting worse. At that same time she had right hand cramping after overexerting the right hand in a party, as well as sometimes left arm tingling. Went to see PCP, had a stress test which was negative and also had MRI/MRA showed previous aneurysm clipping without change, however, also showed symmetric mild signal along bilateral corticospinal tracts, progressed from 2015. It reported this finding is nonspecific however may be seen associated with ALS. Due to this abnormal findings, patient was referred here for further evaluation.   She has history of hypertension, on Cardizem, hydralazine, and metoprolol. Recently her blood pressure was on  the lower side and her PCP is trying to taper off hydralazine. Today blood pressure 99/60. Questionable history of fibromyalgia. He is also on aspirin daily. Denies any alcohol smoking or using illicit drugs.   03/25/16 follow up - she has had some improvement with PT/OT. However, she still has intermittent mild HA at back of head and neck pain and tight. She sometimes has tenderness along the occipital nerve distribution. She did not start amitriptyline as she had confusion with dosing. She uses lidocaine cream PRN. She admitted that in the morning seems less symptoms and as the day goes by, she may feel more symptoms. She told me that she also has fibromyalgia. BP today 115/75.    07/02/16 follow up - pt has been doing well. HA intermittent but improved. Currently no HA. However, on 06/19/16, she was at work, suddenly she had one sharp pain at right side of face, lasted several min, and she also started to have right arm and right chest pain, BP up, she got up and walking out to hallway, about to pass out, her co-worker called EMS and she was sent to ED. At ED, her right facial pain gone, but started to have right dull HA, and still has some mild right arm and chest pain, EKG negative. She had MRI again demonstrated b/l corticospinal tract hyper T2 signal, unchanged. MRA neck negative but MRA head showed left supraclinoid ICA 29m, concerning for small aneurysm. She followed with NSG Dr. CChristella Noaand no intervention needed and will consider CTA head if needed. However, when comparing with her CTA head in 2014, it was unchanged. Pt came in today  want to seek my opinion on that.   UPDATE Mar 28 2020: She lost follow up since Feb 2019. Followed by her primary care physician for her headaches. It has happened a lot lately.  She complains of increased headache following her second shingles vaccination in September 2021, 4-5 times a week, Tylenol provide partial help, She also complains of bilateral shoulder pain,  neck tension, often spreading forward to become mild pressure headaches, She complains of difficulty sleeping, also complains of dry eyes, was seen by ophthalmologist recently, she denies focal signs.  I personally reviewed CT head with contrast in January 2018, no acute abnormality, prior right craniotomy and right aneurysm clipping  CT angiogram of the head in February 2019, no evidence of secondary aneurysm  Update September 27, 2020 SS: Headaches have always been right and left sided neck radiating forward, right now stage 3 CKD, is hesitant to try medications. She didn't take the nortriptyline, because things haven't been bad enough, careful with kidneys. In ER in August 03, 2020  For acute headache, BP was up, and creatinine was 1.31. Nothing since then. Current headaches have been tolerable, will try meditate, relax, doesn't even take medications. 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 Seeing [facility].  MRI of the brain 08/03/20 IMPRESSION: 1. No evidence of acute intracranial abnormality. 2. Similar postsurgical changes of right paraclinoid ICA clipping.  Update November 06, 2021 SS: In ER for right sided headache yesterday with nausea, CT head showed no acute abnormality.  Couldn't get IV access, given oral benadryl and Reglan.  Her BP was up 156/90, HR 100. She was worried if her kidney disease was contributing. CBC was normal, creatinine was 1.16. Her pattern is 6 headaches a month, sore spots to touch, around her eyes. Uses meditation, ice packs to hands for acute headaches. Took Tylenol yesterday, with benefit. MRI brain with and without contrast in April 2022 was stable, Redemonstrated right paraclinoid ICA aneurysm clip. In the past tried Imitrex, couldn't sleep. Today VS are 121/77, HR 60.  REVIEW OF SYSTEMS: Full 14 system review of systems performed and notable only for as above  See HPI  ALLERGIES: Allergies  Allergen Reactions   Ace Inhibitors Swelling   Azithromycin  Anaphylaxis, Shortness Of Breath and Swelling    Breathing difficulty, swelling   Nsaids     Kidney disease   Other Swelling, Other (See Comments) and Cough    Sneezing allergy to cats/grass/dust   Statins Other (See Comments)    Cramping, muscle pain. Tolerates Crestor  Cramping, muscle pain Cramping, muscle pain. Tolerates Crestor    Ciprofloxacin Nausea And Vomiting    Acute kidney failure   Also: kidney failure Acute kidney failure    Medrol [Methylprednisolone] Palpitations    Heart racing, increased BP   Tetracyclines & Related Palpitations    Heart racing, increased bp    HOME MEDICATIONS: Current Outpatient Medications  Medication Sig Dispense Refill   acetaminophen (TYLENOL) 650 MG CR tablet Take 650 mg by mouth 2 (two) times daily as needed for pain.     albuterol (VENTOLIN HFA) 108 (90 Base) MCG/ACT inhaler Inhale 1-2 puffs into the lungs every 4 (four) hours as needed for wheezing or shortness of breath.      aspirin EC 81 MG tablet Take 81 mg by mouth daily at 12 noon.      chlorthalidone (HYGROTON) 25 MG tablet Take 0.5 tablets (12.5 mg total) by mouth daily at 12 noon. (Patient taking differently: Take  12.5 mg by mouth every other day.) 45 tablet 3   Cholecalciferol (VITAMIN D) 50 MCG (2000 UT) CAPS Take 2,000 Units by mouth daily.     diltiazem (CARDIZEM CD) 120 MG 24 hr capsule Take 120 mg by mouth at bedtime.      famotidine (PEPCID) 20 MG tablet TAKE 1 TABLET (20 MG TOTAL) BY MOUTH 2 (TWO) TIMES DAILY AS NEEDED FOR HEARTBURN OR INDIGESTION. 180 tablet 1   KLOR-CON M20 20 MEQ tablet Take 40 mEq by mouth at bedtime.     loratadine (CLARITIN) 10 MG tablet Take 10 mg by mouth daily at 12 noon.     Menthol-Methyl Salicylate (SALONPAS PAIN RELIEF PATCH EX) Apply 2-3 patches topically daily as needed (back / shoulder pain).     metoprolol tartrate (LOPRESSOR) 50 MG tablet Take 50 mg by mouth 2 (two) times daily.     pantoprazole (PROTONIX) 40 MG tablet Take 40 mg by  mouth daily.     rosuvastatin (CRESTOR) 10 MG tablet Take 10 mg by mouth every other day. At 12 noon     No current facility-administered medications for this visit.    PAST MEDICAL HISTORY: Past Medical History:  Diagnosis Date   Aneurysm ([place])    brain   Chest pain    denied on 06/18/2012   Chronic kidney disease    L - upper pole- defect, doesn't effect      Family history of anesthesia complication    brother & neice "flat lined" during induction: neice d/t a medication given for a blood clotting problem; brother d/t "miscalculated amount of anesthesia"   Fibromyalgia    GERD (gastroesophageal reflux disease)    h/o colitis    H. pylori infection    2012- stomach biopsy   H/O hiatal hernia    Headache(784.0)    using tylenol prn   Hyperlipidemia    Hypertension    Hypoglycemia    Normal cardiac stress test    pt. released fr. Dr. [name] care in 10/2011, all findings w/i normal limits   Overweight(278.02)    Obesity   Palpitations    Sinusitis, acute    seen by PCP- 06/17/2012, will treat /w augmentin & tussin/DM   Uterine fibroid    pt. states that she has a "closed cervix"     PAST SURGICAL HISTORY: Past Surgical History:  Procedure Laterality Date   BIOPSY  04/14/2019   Procedure: BIOPSY;  Surgeon: Rogene Houston, MD;  Location: [facility];  Service: Endoscopy;;  antrum   CHOLECYSTECTOMY N/A 04/28/2019   Procedure: LAPAROSCOPIC CHOLECYSTECTOMY;  Surgeon: Aviva Signs, MD;  Location: [facility];  Service: General;  Laterality: N/A;   COLONOSCOPY     COLONOSCOPY N/A 11/26/2018   Procedure: COLONOSCOPY;  Surgeon: Rogene Houston, MD;  Location: [facility];  Service: Endoscopy;  Laterality: N/A;  1:00-12:00pm   CRANIOTOMY Right 06/24/2012   Procedure: CRANIOTOMY INTRACRANIAL ANEURYSM FOR CAROTID;  Surgeon: [name], MD;  Location: [facility];  Service: Neurosurgery;  Laterality: Right;  RIGHT Pterional Craniotomy for aneurysm    ESOPHAGOGASTRODUODENOSCOPY N/A 04/14/2019   Procedure: ESOPHAGOGASTRODUODENOSCOPY (EGD) WITH POSSIBLE ESOPHAGEAL DILATION;  Surgeon: Rogene Houston, MD;  Location: [facility];  Service: Endoscopy;  Laterality: N/A;  300pm   LIPOMA EXCISION     buttock 10/2010    FAMILY HISTORY: Family History  Problem Relation Age of Onset   Hypertension Other    Coronary artery disease Other  Dementia Mother    Dementia Maternal Aunt     SOCIAL HISTORY: Social History   Socioeconomic History   Marital status: Single    Spouse name: Not on file   Number of children: Not on file   Years of education: Not on file   Highest education level: Not on file  Occupational History   Occupation: Full time    Employer: [employer]  Tobacco Use   Smoking status: Former    Packs/day: 0.80    Years: 15.00    Total pack years: 12.00    Types: Cigarettes    Start date: 02/13/1989    Quit date: 04/28/2004    Years since quitting: 17.5   Smokeless tobacco: Never  Vaping Use   Vaping Use: Never used  Substance and Sexual Activity   Alcohol use: No    Alcohol/week: 0.0 standard drinks of alcohol   Drug use: No   Sexual activity: Never    Birth control/protection: None, Abstinence  Other Topics Concern   Not on file  Social History Narrative   Single   Social Determinants of Health   Financial Resource Strain: Not on file  Food Insecurity: Not on file  Transportation Needs: Not on file  Physical Activity: Not on file  Stress: Not on file  Social Connections: Not on file  Intimate Partner Violence: Not on file   PHYSICAL EXAM   Vitals:   11/06/21 1355  BP: 121/77  Pulse: 60  Weight: 188 lb (85.3 kg)  Height: '5\' 3"'$  (1.6 m)    Not recorded     Body mass index is 33.3 kg/m.  Physical Exam  General: The patient is alert and cooperative at the time of the examination.  Skin: No significant peripheral edema is noted.  Neurologic Exam  Mental status: The patient is  alert and oriented x 3 at the time of the examination. The patient has apparent normal recent and remote memory, with an apparently normal attention span and concentration ability.  Cranial nerves: Facial symmetry is present. Speech is normal, no aphasia or dysarthria is noted. Extraocular movements are full. Visual fields are full.  Motor: The patient has good strength in all 4 extremities.  Sensory examination: Soft touch sensation is symmetric on the face, arms, and legs.  Coordination: The patient has good finger-nose-finger and heel-to-shin bilaterally.  Gait and station: The patient has a normal gait.   Reflexes: Deep tendon reflexes are symmetric and normal  DIAGNOSTIC DATA (LABS, IMAGING, TESTING) - I reviewed patient records, labs, notes, testing and imaging myself where available.  ASSESSMENT AND PLAN  Samary [name] is a 61 y.o. female   1. History of right internal carotid artery terminus aneurysm clipping 2. Frequent headaches with migraine features  -CT head in the ER yesterday showed no acute abnormality, showed stable right prior aneurysm clipping -Does not desire preventative or acute medications due to concern for effects on kidneys, has been given nortriptyline but did not take -For acute headache may consider Tylenol or Excedrin Migraine, could also consider Fioricet, is very sparing with medications, prefers to utilize meditation, wants to discuss with nephrology before making any changes -Follow-up in 1 year or sooner if needed, encouraged to reach out for any medication adjustment  Evangeline Dakin, DNP  [facility] [address], [place], [place] [address] [phone]

## 2021-11-06 NOTE — Patient Instructions (Signed)
Call for any worsening headaches Discuss with your kidney doctor, consider excedrin migraine or Tylenol for acute headache  See you back in 1 year virtually

## 2021-12-24 ENCOUNTER — Ambulatory Visit: Payer: 59 | Attending: Cardiology | Admitting: Cardiology

## 2021-12-24 ENCOUNTER — Encounter: Payer: Self-pay | Admitting: *Deleted

## 2021-12-24 ENCOUNTER — Encounter: Payer: Self-pay | Admitting: Cardiology

## 2021-12-24 VITALS — BP 110/88 | HR 68 | Ht 63.0 in | Wt 189.8 lb

## 2021-12-24 DIAGNOSIS — Z79899 Other long term (current) drug therapy: Secondary | ICD-10-CM

## 2021-12-24 DIAGNOSIS — I10 Essential (primary) hypertension: Secondary | ICD-10-CM | POA: Diagnosis not present

## 2021-12-24 DIAGNOSIS — R0789 Other chest pain: Secondary | ICD-10-CM

## 2021-12-24 DIAGNOSIS — R002 Palpitations: Secondary | ICD-10-CM

## 2021-12-24 MED ORDER — POTASSIUM CHLORIDE CRYS ER 20 MEQ PO TBCR: 20.0000 meq | EXTENDED_RELEASE_TABLET | Freq: Every day | ORAL | 6 refills | Status: DC

## 2021-12-24 NOTE — Progress Notes (Signed)
Clinical Summary [name] is a 61 y.o.female seen today for follow up of the following medical problems.      1. Palpitations -  admit 03/2014 with palpitations and chest pain. On that day was hypertensive and tachycardic on admission. - MPI without evidence of ischemia, LVEF 78%. Echo "normal LVEF". TSH 1.4. Event monitor with occasional PACs, no significant arrhythmias  - holter 11/2016 no arrhythmias  - symptoms at times, overall tolerable. Av nodal agents have been limited by low normal HRs at times 50s to 60s    - admit 11/2021 with palpitations, AKI, hypoglyecemia to [facility] - K 3.1 Cr 1.21 BUN 18 Gluc 60 - EKG read as NSR rate 93 - 11/2021 echo [facility]: LVEF 60-65%, mild LAE - had been taking chlorthalidone every other day '25mg'$       2. HTN - hydralazine previously stopped due to concerns for possible drug induced lupus. Symptoms better.   - her dilt was prevoiusly decreased, with this significant elvation in bp's and she went back to takeing '180mg'$  bid - has been on fairly high dose dual av nodal agents due to history of chronic palpitations, has tolerated regimen.      - diltiazem stopped during Jan 2020 admission with acute diverticulitis due to low bp's. Had also had some prior low heart rates. Chlothalidone stopped as well - pcp restarted chlorthalidione and diltiazem for recurrent high bp's     - home bp's usually around    3. Diverticulitis - admitted Jan 2020 with abdominal pain and diarrhea, CT with evidecne of possible diverticulitis - treated with abx   4. Chest pain - long history of atypical chest pain  -  she completed a lexiscan 03/2014  that was overall low risk, small apical defect probable artifact. - seen at [facility] 10/17 with chest pain. CT PE negative.   07/2015 nuclear probable apical thinning, cannot exlcude mild apical ischemia  - seen in ER 08/2017 with chest pain, worst with movement and cough. No evidence of ischemia by EKG or enzymes      05/31/2019 ER visit with epigastric/lower chest pain - from ER notes tender to palpation WBC 8.8 Hgb 13.4 K 3.1 Cr 1.17 hstrop 31-->26-->15 Ddimer 0.58  EKG SR no acute ishcemic changes - CT PE: no PE, no acute process - CT A/P no acute process   - recently had gallbladder removed , pain started about 1 day after - pressure epigastric and midchest, below sternum. Constant pain more than 10 hours. +tachycardia, elevated blood pressures. Pain in midback.  - has had issues with constipiation after surgery.      11/10/19 seen in ER with back pain, some radiation into chest - from ER notes symptoms resolved with GI cocktail - trop neg x 2, Ddimer neg. EKG SR, no ischeimc changes       09/2020 echo: LVEF 65-70%, no WMAs, indet diastolic function, normal RV  - chronic stable symptoms. Can be tender to palpation      5. Hyperlpidemia - labs followed by pcp - she is on crestor   SH: mom with recent stroke, currently at [facility]. Mother has come home and living with her, ongoing weakness on left side. Also has Alzheimers. Just turned 65, she is hospice. 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 pylori infection    2012- stomach biopsy   H/O hiatal hernia    Headache(784.0)    using tylenol prn   Hyperlipidemia    Hypertension    Hypoglycemia    Normal cardiac stress test    pt. released fr. Dr. [name] care in 10/2011, all findings w/i normal limits   Overweight(278.02)    Obesity   Palpitations    Sinusitis, acute    seen by PCP- 06/17/2012, will treat /w  augmentin & tussin/DM   Uterine fibroid    pt. states that she has a "closed cervix"      Allergies  Allergen Reactions   Ace Inhibitors Swelling   Azithromycin Anaphylaxis, Shortness Of Breath and Swelling    Breathing difficulty, swelling   Nsaids     Kidney disease   Other Swelling, Other (See Comments) and Cough    Sneezing allergy to cats/grass/dust   Statins Other (See Comments)    Cramping, muscle pain. Tolerates Crestor  Cramping, muscle pain Cramping, muscle pain. Tolerates Crestor    Ciprofloxacin Nausea And Vomiting    Acute kidney failure   Also: kidney failure Acute kidney failure    Medrol [Methylprednisolone] Palpitations    Heart racing, increased BP   Tetracyclines & Related Palpitations    Heart racing, increased bp     Current Outpatient Medications  Medication Sig Dispense Refill   acetaminophen (TYLENOL) 650 MG CR tablet Take 650 mg by mouth 2 (two) times daily as needed for pain.     albuterol (VENTOLIN HFA) 108 (90 Base) MCG/ACT inhaler Inhale 1-2 puffs into the lungs every 4 (four) hours as needed for wheezing or shortness of breath.      aspirin EC 81 MG tablet Take 81 mg by mouth daily at 12 noon.      chlorthalidone (HYGROTON) 25 MG tablet Take 0.5 tablets (12.5 mg total) by mouth daily at 12 noon. (Patient taking differently: Take 12.5 mg by mouth every other day.) 45 tablet 3   diltiazem (CARDIZEM CD) 120 MG 24 hr capsule Take 120 mg by mouth at bedtime.      famotidine (PEPCID) 20 MG tablet TAKE 1 TABLET (20 MG TOTAL) BY MOUTH 2 (TWO) TIMES DAILY AS NEEDED FOR HEARTBURN OR INDIGESTION. 180 tablet 1   KLOR-CON M20 20 MEQ tablet Take 40 mEq by mouth at bedtime.     loratadine (CLARITIN) 10 MG tablet Take 10 mg by mouth daily at 12 noon.     Menthol-Methyl Salicylate (SALONPAS PAIN RELIEF PATCH EX) Apply 2-3 patches topically daily as needed (back / shoulder pain).     metoprolol tartrate (LOPRESSOR) 50 MG tablet Take 50 mg by mouth 2 (two) times  daily.     pantoprazole (PROTONIX) 40 MG tablet Take 40 mg by mouth daily.     rosuvastatin (CRESTOR) 10 MG tablet Take 10 mg by mouth every other day. At 12 noon     Cholecalciferol (VITAMIN D) 50 MCG (2000 UT) CAPS Take 2,000 Units by mouth daily. (Patient not taking: Reported on 12/24/2021)     No current facility-administered medications for this visit.     Past Surgical  History:  Procedure Laterality Date   BIOPSY  04/14/2019   Procedure: BIOPSY;  Surgeon: Rogene Houston, MD;  Location: [facility];  Service: Endoscopy;;  antrum   CHOLECYSTECTOMY N/A 04/28/2019   Procedure: LAPAROSCOPIC CHOLECYSTECTOMY;  Surgeon: Aviva Signs, MD;  Location: [facility] ORS;  Service: General;  Laterality: N/A;   COLONOSCOPY     COLONOSCOPY N/A 11/26/2018   Procedure: COLONOSCOPY;  Surgeon: Rogene Houston, MD;  Location: [facility];  Service: Endoscopy;  Laterality: N/A;  1:00-12:00pm   CRANIOTOMY Right 06/24/2012   Procedure: CRANIOTOMY INTRACRANIAL ANEURYSM FOR CAROTID;  Surgeon: [name], MD;  Location: [place] [facility];  Service: Neurosurgery;  Laterality: Right;  RIGHT Pterional Craniotomy for aneurysm   ESOPHAGOGASTRODUODENOSCOPY N/A 04/14/2019   Procedure: ESOPHAGOGASTRODUODENOSCOPY (EGD) WITH POSSIBLE ESOPHAGEAL DILATION;  Surgeon: Rogene Houston, MD;  Location: [facility];  Service: Endoscopy;  Laterality: N/A;  300pm   LIPOMA EXCISION     buttock 10/2010     Allergies  Allergen Reactions   Ace Inhibitors Swelling   Azithromycin Anaphylaxis, Shortness Of Breath and Swelling    Breathing difficulty, swelling   Nsaids     Kidney disease   Other Swelling, Other (See Comments) and Cough    Sneezing allergy to cats/grass/dust   Statins Other (See Comments)    Cramping, muscle pain. Tolerates Crestor  Cramping, muscle pain Cramping, muscle pain. Tolerates Crestor    Ciprofloxacin Nausea And Vomiting    Acute kidney failure   Also: kidney failure Acute kidney failure    Medrol  [Methylprednisolone] Palpitations    Heart racing, increased BP   Tetracyclines & Related Palpitations    Heart racing, increased bp      Family History  Problem Relation Age of Onset   Hypertension Other    Coronary artery disease Other    Dementia Mother    Dementia Maternal Aunt      Social History [name] reports that she quit smoking about 17 years ago. Her smoking use included cigarettes. She started smoking about 32 years ago. She has a 12.00 pack-year smoking history. She has never used smokeless tobacco. Ms. Boys reports no history of alcohol use.   Review of Systems CONSTITUTIONAL: No weight loss, fever, chills, weakness or fatigue.  HEENT: Eyes: No visual loss, blurred vision, double vision or yellow sclerae.No hearing loss, sneezing, congestion, runny nose or sore throat.  SKIN: No rash or itching.  CARDIOVASCULAR: per hpi RESPIRATORY: per hpi GASTROINTESTINAL: No anorexia, nausea, vomiting or diarrhea. No abdominal pain or blood.  GENITOURINARY: No burning on urination, no polyuria NEUROLOGICAL: No headache, dizziness, syncope, paralysis, ataxia, numbness or tingling in the extremities. No change in bowel or bladder control.  MUSCULOSKELETAL: No muscle, back pain, joint pain or stiffness.  LYMPHATICS: No enlarged nodes. No history of splenectomy.  PSYCHIATRIC: No history of depression or anxiety.  ENDOCRINOLOGIC: No reports of sweating, cold or heat intolerance. No polyuria or polydipsia.  [name]   Physical Examination Today's Vitals   12/24/21 1556  BP: 110/88  Pulse: 68  SpO2: 98%  Weight: 189 lb 12.8 oz (86.1 kg)  Height: '5\' 3"'$  (1.6 m)   Body mass index is 33.62 kg/m.  Gen: resting comfortably, no acute distress HEENT: no scleral icterus, pupils equal round and reactive, no palptable cervical adenopathy,  CV: RRR, no m/r,g no jvd Resp: Clear to auscultation bilaterally GI: abdomen is soft, non-tender, non-distended, normal bowel sounds, no  hepatosplenomegaly MSK: extremities are warm, no edema.  Skin: warm, no rash Neuro:  no focal deficits Psych: appropriate affect   Diagnostic Studies 05/2009 Nuclear stress No ischemia   03/2014 Echo Study Conclusions  - Left ventricle: The cavity size was normal. Systolic function was   normal. Wall motion was normal; there were no regional wall   motion abnormalities.   03/2014 Lexiscan MPI IMPRESSION: 1. No reversible ischemia or infarction.   2. Normal left ventricular wall motion.   3. Left ventricular ejection fraction 78%   4. Low-risk stress test findings*.   03/2014 Event Monitor Symptoms correlate with NSR. One episode of fluttering showed 2 PACs. No significant arrhythmias   07/2015 Lexiscan MPI There was no ST segment deviation noted during stress. This is a low risk study. The left ventricular ejection fraction is mildly decreased (45-54%). LVEF is 49% just slightly below normal, visually appears normal. Consider correlating with echo. There is a small mild intensity distal anterior and apical defect with mild reversibility. The apex has normal wall motion. Finding is likely secondary to apical thinning, there is also significant radiotracer uptake in the adjacent gut which may create some artifact. Cannot rule would mild apical ischemia. Overall findings are low risk.       11/2016 holter Min HR 42, Max HR 94, Avg HR 57 No supraventricular or ventricular ectopy Telemetry tracings show sinus bradycardia and sinus rhythm No symptoms reported No significant arrhythmias    Assessment and Plan  1.Chest pain - long history of symptoms with negative prior cardiac workups - continue to monitor    2. Palpitations - tolerable fairly mild symptoms. AV nodal agent dosing limited due to low HRs at times 50s to 60s - continue current meds     3. HTN -low K and elected Cr, we will stop chlorthalidone and recheck bmet       Arnoldo Lenis, M.D.

## 2021-12-24 NOTE — Patient Instructions (Addendum)
Medication Instructions:  Stop Chlorthalidone.  Decrease Potassium to 27mq daily  Continue all other medications.     Labwork: BMET, Mg - orders given today Please do in 1 week (around 12/31/2021) Office will contact with results via phone, letter or mychart.     Testing/Procedures: none  Follow-Up: 6 months   Any Other Special Instructions Will Be Listed Below (If Applicable).   If you need a refill on your cardiac medications before your next appointment, please call your pharmacy.

## 2022-01-09 ENCOUNTER — Telehealth: Payer: Self-pay | Admitting: Cardiology

## 2022-01-09 NOTE — Telephone Encounter (Signed)
Patient states that she was seen at [facility] ER on Sunday, 9/10 because her BP was elevated (175/112). States that last night her BP was 188/87. States that she contacted Dr. Quillian Quince (PCP) who told her to stop taking the potassium and prescribed her losartan pot 50 mg once a day. States that his morning her BP was 158/94 and her oxygen has been around 97/98/99.  States that she has not taken the losartan yet because she wanted to see if Dr. Harl Bowie thinks that this is something that she should be taking. Please advise.

## 2022-01-09 NOTE — Telephone Encounter (Signed)
Patient called to say she got blood work done recently she wants to make sure Dr. Harl Bowie takes a look at it.   Pt c/o BP issue: STAT if pt c/o blurred vision, one-sided weakness or slurred speech  1. What are your last 5 BP readings? 188/87  2. Are you having any other symptoms (ex. Dizziness, headache, blurred vision, passed out)? no  3. What is your BP issue? She stated her BP is running high.  She called her PCP and he put her on another blood pressure medication.  He stop her potassium, and put her on losartan potassium.  She wants to make sure Dr. Harl Bowie is okay with that.

## 2022-01-10 ENCOUNTER — Encounter: Payer: Self-pay | Admitting: Cardiology

## 2022-01-10 NOTE — Telephone Encounter (Signed)
Pt is calling back to clarify if she should start taking the new medication prescribed to her. She needs to know if she should go to ED or take meds.

## 2022-01-10 NOTE — Telephone Encounter (Signed)
Error

## 2022-01-10 NOTE — Telephone Encounter (Signed)
Pt c/o BP issue: STAT if pt c/o blurred vision, one-sided weakness or slurred speech  1. What are your last 5 BP readings?  135/84 - last taken 133/82 147/91 141/92 147/94  193/98 - this A.M.   2. Are you having any other symptoms (ex. Dizziness, headache, blurred vision, passed out)? Pt states she feels weird and out of place and shaky.   3. What is your BP issue?  Pt states that her bp is having issues going down and would like to know if she should go to ED or start medication given. Please advise.

## 2022-01-10 NOTE — Telephone Encounter (Signed)
Losartan is a good option, I think fine to start and update [place] on bp's in 1 week   [name] MD

## 2022-01-10 NOTE — Telephone Encounter (Signed)
Patient informed and verbalized understanding of plan. 

## 2022-01-25 ENCOUNTER — Telehealth (INDEPENDENT_AMBULATORY_CARE_PROVIDER_SITE_OTHER): Payer: Self-pay | Admitting: *Deleted

## 2022-01-25 NOTE — Telephone Encounter (Signed)
Patient last seen may 2022. She wanted to make appt for trouble swallowing. I explained to her we are merging with [facility] and she is ok having an appointment with any provider since she saw Dr. Laural Golden and she is aware he has retired. She is aware no scheduler here today and I told her she would get a call back on Monday.  [phone].

## 2022-01-29 NOTE — Telephone Encounter (Signed)
I called and left patient a message on voicemail to call back to schedule appointment.

## 2022-02-19 ENCOUNTER — Other Ambulatory Visit (INDEPENDENT_AMBULATORY_CARE_PROVIDER_SITE_OTHER): Payer: Self-pay

## 2022-02-19 ENCOUNTER — Ambulatory Visit (INDEPENDENT_AMBULATORY_CARE_PROVIDER_SITE_OTHER): Payer: 59 | Admitting: Gastroenterology

## 2022-02-19 ENCOUNTER — Encounter (INDEPENDENT_AMBULATORY_CARE_PROVIDER_SITE_OTHER): Payer: Self-pay

## 2022-02-19 ENCOUNTER — Encounter (INDEPENDENT_AMBULATORY_CARE_PROVIDER_SITE_OTHER): Payer: Self-pay | Admitting: Gastroenterology

## 2022-02-19 VITALS — BP 126/75 | HR 73 | Temp 98.7°F | Ht 63.0 in | Wt 195.6 lb

## 2022-02-19 DIAGNOSIS — K449 Diaphragmatic hernia without obstruction or gangrene: Secondary | ICD-10-CM

## 2022-02-19 DIAGNOSIS — R1013 Epigastric pain: Secondary | ICD-10-CM

## 2022-02-19 DIAGNOSIS — K219 Gastro-esophageal reflux disease without esophagitis: Secondary | ICD-10-CM | POA: Diagnosis not present

## 2022-02-19 MED ORDER — OMEPRAZOLE 40 MG PO CPDR: 40.0000 mg | DELAYED_RELEASE_CAPSULE | Freq: Every day | ORAL | 3 refills | Status: DC

## 2022-02-19 NOTE — Patient Instructions (Signed)
It was nice to meet you! We will stop protonix and start omeprazole '40mg'$  once daily We will get you scheduled for EGD for further evaluation of your symptoms  Follow up 4 months

## 2022-02-19 NOTE — Progress Notes (Signed)
Referring Provider: [name], MD Primary Care Physician:  [name], MD Primary GI Physician: previously [facility]  Chief Complaint  Patient presents with   Chest Pain    Has some aching in epi gastric area that goes through back. Comes and goes. Feels like a raw, nagging ache, has gas, a little nausea every now and then,    epi gastric pain   HPI:   [name] [name] is a 61 y.o. female with past medical history of brain aneurysm, CKD, fibromyalgia, GERD, h pylori, hiatal hernia, HLD, HTN.   Patient presenting today for epigastric pain.  Last seen may 2022, at that time having some atypical chest pain and LUQ pain. She was switched from PPI to H2B at that time due to declining renal function. Chest pain not felt to be related to GERD at that time. She was following with cardiology, suspected possible esophageal spasm.   Present:  Patient reports a heaviness in her epigastric area, states that sometimes it almost feels raw in that area. Feels achy at times, discomfort is intermittent. Reports it will occur sometimes for a few days then improve though it has lasted as long as 2 weeks. Pain sometimes radiates into her back. She does feel that pain sometimes improves some with moving around. She does report history of costochondritis. She notes that she has had previous pain similar to this in the past but not as severe. She also reports a cough with thick white phlegm that began when this pain began. She denies specific heartburn or acid regurgitation. No sore throat or hoarseness. She denies nausea or vomiting. No changes in appetite. She does endorse some early satiety and sometimes feeling bloated. sometimes feeling like foods do not pass down well and get stuck in lower chest area. She is currently maintained on protonix '40mg'$  daily, she does not think she every switched off of PPI to H2B as previously recommended by Dr. Laural Golden. Has been on protonix for many years, per patient. Denies  rectal bleeding, melena, weight loss.     Last Colonoscopy:11/26/18- Diverticulosis in the entire examined colon. - External hemorrhoids. - No specimens collected. Last Endoscopy:04/14/19 - LA Grade A reflux esophagitis with no bleeding. - 2 cm hiatal hernia. - No endoscopic esophageal abnormality to explain patient's dysphagia. Esophagus dilated. Dilated. - Gastritis. Biopsied-focal uilceration and mild inflammation, no h pylori - Normal duodenal bulb and second portion of the duodenum.  Recommendations:  Repeat colonoscopy in 10 years   Past Medical History:  Diagnosis Date   Aneurysm ([place])    brain   Chest pain    denied on 06/18/2012   Chronic kidney disease    L - upper pole- defect, doesn't effect      Family history of anesthesia complication    brother & neice "flat lined" during induction: neice d/t a medication given for a blood clotting problem; brother d/t "miscalculated amount of anesthesia"   Fibromyalgia    GERD (gastroesophageal reflux disease)    h/o colitis    H. pylori infection    2012- stomach biopsy   H/O hiatal hernia    Headache(784.0)    using tylenol prn   Hyperlipidemia    Hypertension    Hypoglycemia    Normal cardiac stress test    pt. released fr. Dr. [name] care in 10/2011, all findings w/i normal limits   Overweight(278.02)    Obesity   Palpitations    Sinusitis, acute    seen by PCP-  06/17/2012, will treat /w augmentin & tussin/DM   Uterine fibroid    pt. states that she has a "closed cervix"     Past Surgical History:  Procedure Laterality Date   BIOPSY  04/14/2019   Procedure: BIOPSY;  Surgeon: Rogene Houston, MD;  Location: [facility];  Service: Endoscopy;;  antrum   CHOLECYSTECTOMY N/A 04/28/2019   Procedure: LAPAROSCOPIC CHOLECYSTECTOMY;  Surgeon: Aviva Signs, MD;  Location: [facility] ORS;  Service: General;  Laterality: N/A;   COLONOSCOPY     COLONOSCOPY N/A 11/26/2018   Procedure: COLONOSCOPY;  Surgeon: Rogene Houston, MD;   Location: [facility];  Service: Endoscopy;  Laterality: N/A;  1:00-12:00pm   CRANIOTOMY Right 06/24/2012   Procedure: CRANIOTOMY INTRACRANIAL ANEURYSM FOR CAROTID;  Surgeon: [name], MD;  Location: [facility];  Service: Neurosurgery;  Laterality: Right;  RIGHT Pterional Craniotomy for aneurysm   ESOPHAGOGASTRODUODENOSCOPY N/A 04/14/2019   Procedure: ESOPHAGOGASTRODUODENOSCOPY (EGD) WITH POSSIBLE ESOPHAGEAL DILATION;  Surgeon: Rogene Houston, MD;  Location: [facility];  Service: Endoscopy;  Laterality: N/A;  300pm   LIPOMA EXCISION     buttock 10/2010    Current Outpatient Medications  Medication Sig Dispense Refill   acetaminophen (TYLENOL) 650 MG CR tablet Take 650 mg by mouth 2 (two) times daily as needed for pain.     albuterol (VENTOLIN HFA) 108 (90 Base) MCG/ACT inhaler Inhale 1-2 puffs into the lungs every 4 (four) hours as needed for wheezing or shortness of breath.      aspirin EC 81 MG tablet Take 81 mg by mouth daily at 12 noon.      diltiazem (CARDIZEM CD) 120 MG 24 hr capsule Take 120 mg by mouth at bedtime.      famotidine (PEPCID) 20 MG tablet TAKE 1 TABLET (20 MG TOTAL) BY MOUTH 2 (TWO) TIMES DAILY AS NEEDED FOR HEARTBURN OR INDIGESTION. 180 tablet 1   loratadine (CLARITIN) 10 MG tablet Take 10 mg by mouth daily at 12 noon.     losartan (COZAAR) 50 MG tablet Take 50 mg by mouth daily.     Menthol-Methyl Salicylate (SALONPAS PAIN RELIEF PATCH EX) Apply 2-3 patches topically daily as needed (back / shoulder pain).     metoprolol tartrate (LOPRESSOR) 50 MG tablet Take 50 mg by mouth 2 (two) times daily.     pantoprazole (PROTONIX) 40 MG tablet Take 40 mg by mouth daily.     potassium chloride SA (KLOR-CON M20) 20 MEQ tablet Take 1 tablet (20 mEq total) by mouth at bedtime. 30 tablet 6   rosuvastatin (CRESTOR) 10 MG tablet Take 10 mg by mouth every other day. At 12 noon     No current facility-administered medications for this visit.    Allergies as of 02/19/2022  - Review Complete 12/24/2021  Allergen Reaction Noted   Ace inhibitors Swelling    Azithromycin Anaphylaxis, Shortness Of Breath, and Swelling 01/01/2011   Nsaids  06/09/2018   Other Swelling, Other (See Comments), and Cough 08/07/2012   Statins Other (See Comments) 09/10/2017   Ciprofloxacin Nausea And Vomiting 12/11/2011   Medrol [methylprednisolone] Palpitations 01/01/2011   Tetracyclines & related Palpitations 01/01/2011    Family History  Problem Relation Age of Onset   Hypertension Other    Coronary artery disease Other    Dementia Mother    Dementia Maternal Aunt     Social History   Socioeconomic History   Marital status: Single    Spouse name: Not on file  Number of children: Not on file   Years of education: Not on file   Highest education level: Not on file  Occupational History   Occupation: Full time    Employer: [employer]  Tobacco Use   Smoking status: Former    Packs/day: 0.80    Years: 15.00    Total pack years: 12.00    Types: Cigarettes    Start date: 02/13/1989    Quit date: 04/28/2004    Years since quitting: 17.8   Smokeless tobacco: Never  Vaping Use   Vaping Use: Never used  Substance and Sexual Activity   Alcohol use: No    Alcohol/week: 0.0 standard drinks of alcohol   Drug use: No   Sexual activity: Never    Birth control/protection: None, Abstinence  Other Topics Concern   Not on file  Social History Narrative   Single   Social Determinants of Health   Financial Resource Strain: Not on file  Food Insecurity: Not on file  Transportation Needs: Not on file  Physical Activity: Not on file  Stress: Not on file  Social Connections: Not on file   Review of systems General: negative for malaise, night sweats, fever, chills, weight loss Neck: Negative for lumps, goiter, pain and significant neck swelling Resp: Negative for cough, wheezing, dyspnea at rest CV: Negative for chest pain, leg swelling, palpitations,  orthopnea GI: denies melena, hematochezia, nausea, vomiting, diarrhea, constipation, dysphagia, odyonophagia, early satiety or unintentional weight loss. +epigastric discomfort +productive cough  MSK: Negative for joint pain or swelling, back pain, and muscle pain. Derm: Negative for itching or rash Psych: Denies depression, anxiety, memory loss, confusion. No homicidal or suicidal ideation.  Heme: Negative for prolonged bleeding, bruising easily, and swollen nodes. Endocrine: Negative for cold or heat intolerance, polyuria, polydipsia and goiter. Neuro: negative for tremor, gait imbalance, syncope and seizures. The remainder of the review of systems is noncontributory.  Physical Exam: LMP 08/25/2012  General:   Alert and oriented. No distress noted. Pleasant and cooperative.  Head:  Normocephalic and atraumatic. Eyes:  Conjuctiva clear without scleral icterus. Mouth:  Oral mucosa pink and moist. Good dentition. No lesions. Heart: Normal rate and rhythm, s1 and s2 heart sounds present.  Lungs: Clear lung sounds in all lobes. Respirations equal and unlabored. Abdomen:  +BS, soft, non-tender and non-distended. No rebound or guarding. No HSM or masses noted. Derm: No palmar erythema or jaundice Msk:  Symmetrical without gross deformities. Normal posture. Extremities:  Without edema. Neurologic:  Alert and  oriented x4 Psych:  Alert and cooperative. Normal mood and affect.  Invalid input(s): "6 MONTHS"   ASSESSMENT: [name] [name] is a 61 y.o. female presenting today for epigastric pain.  Patient with history of GERD presenting with atypical symptoms in the past, also with history of atypical chest/epigastric pain. She notes epigastric pain is intermittent, seems to improve some with moving around. Has some early satiety and occasionally feels that food stick in lower chest, no heartburn or acid regurgitation. She denies nausea or vomiting. Some bloating at times and notes recent  productive cough since pain began.  Has been maintained on protonix for many years. Also with hx of 2cm hiatal hernia on EGD in 2020. Symptoms potentially musculoskeletal related given she feels they improve some with movement, though Query if symptoms are related to her hiatal hernia, potentially, atypical GERD. Will stop protonix and start omeprazole '40mg'$  daily as well as proceed with EGD for further evaluation. Should also practice reflux precautions to  include Avoiding greasy, spicy, fried, citrus foods, caffeine, carbonated drinks, chocolate and alcohol. Stay upright 2-3 hours after eating, prior to lying down and avoid eating late in the evenings.Indications, risks and benefits of procedure discussed in detail with patient. Patient verbalized understanding and is in agreement to proceed with EGD at this time.    PLAN:  Schedule EGD, ASA II, ENDO 1 2. Stop protonix  3. Start omeprazole '40mg'$  daily  4. Reflux precautions.   All questions were answered, patient verbalized understanding and is in agreement with plan as outlined above.   Follow Up: 4 months   Anjelika Ausburn L. Alver Sorrow, MSN, APRN, AGNP-C Adult-Gerontology Nurse Practitioner [facility]  I have reviewed the note and agree with the APP's assessment as described in this progress note  Maylon Peppers, MD Gastroenterology and Hepatology [facility]astro[facility]ology

## 2022-03-04 ENCOUNTER — Telehealth: Payer: Self-pay | Admitting: Cardiology

## 2022-03-04 NOTE — Telephone Encounter (Signed)
Pt calling to f/u on call from this morning due to not having received a callback yet. Please advise

## 2022-03-04 NOTE — Telephone Encounter (Signed)
Pt c/o medication issue:  1. Name of Medication: metoprolol tartrate 50 mg 24 hour tablet   2. How are you currently taking this medication (dosage and times per day)? Is taking 12 hour tablet two times daily   3. Are you having a reaction (difficulty breathing--STAT)? No   4. What is your medication issue? Patient is calling in stating her PCP is out of office as this time and the provider covering for him called in this medication for 24 hour tablets instead of 12 hour tablets as Dr. Harl Bowie prescribes. She reports her pharmacist would not give her the new prescription due to being concerned that she was on too much medication and recommended she call our office regarding this. Patient also states her PCP took her off of potassium and her water pill back in August due to her being dehydrated. Please advise.

## 2022-03-05 MED ORDER — METOPROLOL TARTRATE 50 MG PO TABS: 50.0000 mg | ORAL_TABLET | Freq: Two times a day (BID) | ORAL | 1 refills | Status: DC

## 2022-03-05 NOTE — Addendum Note (Signed)
Addended by: Sung Amabile on: 03/05/2022 02:54 PM   Modules accepted: Orders

## 2022-03-05 NOTE — Telephone Encounter (Signed)
We can refill the short acting metoprolol she was on, lopressor. As far as the tingling would need either pcp or ER evalaution as not a specific cardiac symptoms  [name] MD

## 2022-03-05 NOTE — Telephone Encounter (Signed)
I have not seen that side effect to losartan but could be possible. Her renal function is not so bad it would prohibit using this medication. Can we clarify she was started on '50mg'$  of losartan, it could be that it was a bit too high as a start dose and perhaps starting at a lower dose of '25mg'$  daily would be better tolerated. May give a try and if ongoing side effects then we could try a different bp med but she has had some side effects to others so a little challenging to fine alternatives but there are some available  [name] MD

## 2022-03-05 NOTE — Telephone Encounter (Signed)
Pt calling back regarding call yesterday. Pt states that she has tingling in her right hand. Pt is currently on the way to the hospital to be seen. Pt would like a callback as soon as possible. Please advise

## 2022-03-05 NOTE — Telephone Encounter (Signed)
Patient came in the office stating that she was started on losartan by her PCP about a month ago. States that her BP readings have been in the 160's over 90's. States that 7-8 after taking, she started having a tingling sensation in her right hand (1st three fingers and palm). States that she went to get a refill and the pharmacist informed her that the losartan is "too much for her to be taking." States that she did research on losartan and found out that it was not good with people who have kidney disease. States that this morning, she started having a tingling sensation in the lower part of her head and still in her fingers and now in the left lower part of her head. Patient made aware that I would send in lopressor but she wants to know if Dr. Harl Bowie feels the need to up her lopressor and if so, is she able to stop the losartan since she seems to not tolerate well. BP today 130/88 LA P 64 O2 94 %  Please advise

## 2022-03-05 NOTE — Telephone Encounter (Signed)
Patient made aware. Prescription for lopressor 50 mg twice a day sent to [name] [name]. Instructed to take losartan 25 mg daily, per Dr. Harl Bowie. Also instructed patient to take BP readings for the next 2 weeks (same arm, time and cuff) and report these readings to [place] via phone or Mychart. Verbalized understanding.

## 2022-03-07 ENCOUNTER — Other Ambulatory Visit: Payer: Self-pay

## 2022-03-07 ENCOUNTER — Emergency Department (HOSPITAL_COMMUNITY)
Admission: EM | Admit: 2022-03-07 | Discharge: 2022-03-07 | Disposition: A | Discharge status: Home / Self Care | Payer: 59 | Attending: Emergency Medicine | Admitting: Emergency Medicine

## 2022-03-07 DIAGNOSIS — I159 Secondary hypertension, unspecified: Secondary | ICD-10-CM | POA: Diagnosis not present

## 2022-03-07 DIAGNOSIS — R209 Unspecified disturbances of skin sensation: Secondary | ICD-10-CM | POA: Insufficient documentation

## 2022-03-07 DIAGNOSIS — Z79899 Other long term (current) drug therapy: Secondary | ICD-10-CM | POA: Insufficient documentation

## 2022-03-07 DIAGNOSIS — I129 Hypertensive chronic kidney disease with stage 1 through stage 4 chronic kidney disease, or unspecified chronic kidney disease: Secondary | ICD-10-CM | POA: Insufficient documentation

## 2022-03-07 DIAGNOSIS — N183 Chronic kidney disease, stage 3 unspecified: Secondary | ICD-10-CM | POA: Diagnosis not present

## 2022-03-07 DIAGNOSIS — Z7982 Long term (current) use of aspirin: Secondary | ICD-10-CM | POA: Diagnosis not present

## 2022-03-07 DIAGNOSIS — R202 Paresthesia of skin: Secondary | ICD-10-CM

## 2022-03-07 MED ORDER — AMLODIPINE BESYLATE 5 MG PO TABS: 5.0000 mg | ORAL_TABLET | Freq: Once | ORAL | Status: DC

## 2022-03-07 NOTE — ED Provider Notes (Signed)
[facility] EMERGENCY DEPARTMENT Provider Note   CSN: [id number] Arrival date & time: 03/07/22  1323     History  Chief Complaint  Patient presents with   Tingling    [name] [name] is a 61 y.o. female.  Patient presents to the emergency department complaining of intermittent right hand tingling/burning pain and intermittent high blood pressure.  Patient states that they have been changing her blood pressure medication recently due to reactions to lisinopril and due to a recent possible reaction to losartan.  She was seen at [facility] yesterday for the same complaints.  She states that her blood pressure has been reaching 188 systolic over 416-6 10 diastolic.  She currently takes metoprolol for her blood pressure.  She was given amlodipine yesterday while at [facility] which did seem to somewhat improve her blood pressure readings.  She denies any complaints other than hypertension and right hand numbness/pain at this time.  She states that her right hand issues have been ongoing for 2 to 3 weeks, since starting losartan.  Past medical history significant for stage III chronic kidney disease, GERD, palpitations, hypertension, fibromyalgia  HPI     Home Medications Prior to Admission medications   Medication Sig Start Date End Date Taking? Authorizing Provider  acetaminophen (TYLENOL) 650 MG CR tablet Take 650 mg by mouth 2 (two) times daily as needed for pain.    [provider]  albuterol (VENTOLIN HFA) 108 (90 Base) MCG/ACT inhaler Inhale 1-2 puffs into the lungs every 4 (four) hours as needed for wheezing or shortness of breath.  06/06/15   [provider]  aspirin EC 81 MG tablet Take 81 mg by mouth daily at 12 noon.  04/17/19   Rehman, Mechele Dawley, MD  diltiazem (CARDIZEM CD) 120 MG 24 hr capsule Take 120 mg by mouth at bedtime.  06/09/18   [provider]  loratadine (CLARITIN) 10 MG tablet Take 10 mg by mouth daily at 12 noon.    [provider]   losartan (COZAAR) 50 MG tablet Take 50 mg by mouth daily. 01/09/22   [provider]  Menthol-Methyl Salicylate (SALONPAS PAIN RELIEF PATCH EX) Apply 2-3 patches topically daily as needed (back / shoulder pain).    [provider]  metoprolol tartrate (LOPRESSOR) 50 MG tablet Take 1 tablet (50 mg total) by mouth 2 (two) times daily. 03/05/22   Arnoldo Lenis, MD  omeprazole (PRILOSEC) 40 MG capsule Take 1 capsule (40 mg total) by mouth daily. 02/19/22   Carlan, Chelsea L, NP  potassium chloride SA (KLOR-CON M20) 20 MEQ tablet Take 1 tablet (20 mEq total) by mouth at bedtime. Patient not taking: Reported on 02/19/2022 12/24/21   Arnoldo Lenis, MD  rosuvastatin (CRESTOR) 10 MG tablet Take 10 mg by mouth every other day. At 12 noon    [provider]      Allergies    Ace inhibitors, Azithromycin, Nsaids, Other, Statins, Ciprofloxacin, Medrol [methylprednisolone], and Tetracyclines & related    Review of Systems   Review of Systems  Respiratory:  Negative for shortness of breath.   Cardiovascular:  Negative for chest pain.  Gastrointestinal:  Negative for abdominal pain.  Genitourinary:  Negative for dysuria.  Musculoskeletal:  Positive for myalgias.  Neurological:  Negative for light-headedness and headaches.    Physical Exam Updated Vital Signs BP (!) 140/75 (BP Location: Right Arm)   Pulse 63   Temp 98.6 F (37 C) (Oral)   Resp 18   Ht  $'5\' 3"'z$  (1.6 m)   Wt 89.8 kg   LMP 08/25/2012   SpO2 96%   BMI 35.07 kg/m  Physical Exam Vitals and nursing note reviewed.  Constitutional:      General: She is not in acute distress.    Appearance: She is well-developed.  HENT:     Head: Normocephalic and atraumatic.  Eyes:     Conjunctiva/sclera: Conjunctivae normal.  Cardiovascular:     Rate and Rhythm: Normal rate and regular rhythm.     Heart sounds: No murmur heard. Pulmonary:     Effort: Pulmonary effort is normal. No respiratory distress.      Breath sounds: Normal breath sounds.  Abdominal:     Palpations: Abdomen is soft.     Tenderness: There is no abdominal tenderness.  Musculoskeletal:        General: No swelling, tenderness or deformity. Normal range of motion.     Cervical back: Neck supple.     Comments: Negative Tinel's test, right hand  Skin:    General: Skin is warm and dry.     Capillary Refill: Capillary refill takes less than 2 seconds.  Neurological:     Mental Status: She is alert.  Psychiatric:        Mood and Affect: Mood normal.     ED Results / Procedures / Treatments   Labs (all labs ordered are listed, but only abnormal results are displayed) Labs Reviewed - No data to display  EKG None  Radiology No results found.  Procedures Procedures    Medications Ordered in ED Medications - No data to display  ED Course/ Medical Decision Making/ A&P                           Medical Decision Making  Patient presents with a chief complaint of hypertension.  Patient has been relatively normotensive since arrival at the emergency department with most recent blood pressure being 140/75.  The patient did just stop her losartan and took amlodipine yesterday at the emergency department with no apparent ill effects.  I considered adding amlodipine but the patient is currently take Cardizem and I do not want to double her calcium channel blockers.  Plan to discharge patient home with recommendations to continue metoprolol and follow-up with her primary care provider.  There are no signs of endorgan damage due to hypertension at this time and no indication for emergent blood pressure reduction.  I see no indication for labs at this time.  Patient had labs completed yesterday while at [facility] which showed kidney function at baseline  The patient's right hand issues may be due to her underlying fibromyalgia, or possibly due to carpal tunnel syndrome.  There is no indication for emergent imaging at this  time.  The pain and numbness has been ongoing for 2 to 3 weeks.  I recommend the patient follow-up with her primary care provider for further evaluation and management.  She may need to have nerve testing scheduled at some future date.  I see no reason for admission at this time.  Exam at this time is overall benign with normal vital signs.  Discharge home with outpatient follow-up with primary care        Final Clinical Impression(s) / ED Diagnoses Final diagnoses:  Secondary hypertension  Numbness and tingling in right hand    Rx / DC Orders ED Discharge Orders     None  Dorothyann Peng, PA-C 03/07/22 1502    Wyvonnia Dusky, MD 03/07/22 [id number]

## 2022-03-07 NOTE — ED Triage Notes (Signed)
Pt arrived via [facility] c/o high BP and R hand tingling and numbness intermittently x 2-3 weeks ago. Was seen yesterday at [facility] for high BP as well. Pt states the tingling and numbness started when she was started on a new BP medication. Per EMS, BP 131/79, all other vitals WDL

## 2022-03-07 NOTE — Discharge Instructions (Addendum)
You were seen today for concerns of a high blood pressure and right hand numbness.  Your blood pressure was mildly elevated while in the emergency department.  I recommend that you continue your home medications with consideration of stopping losartan as recommended yesterday by a different provider.  Please schedule a follow-up with your primary care provider soon as possible for further evaluation and management.  I did consider adding amlodipine 2 prescriptions but it is also a calcium channel blocker and this may be too much when combined with your current Cardizem.  I also recommend follow-up with your primary care provider for the right hand numbness, fever.

## 2022-03-12 ENCOUNTER — Encounter (INDEPENDENT_AMBULATORY_CARE_PROVIDER_SITE_OTHER): Payer: Self-pay | Admitting: Gastroenterology

## 2022-03-25 ENCOUNTER — Telehealth (INDEPENDENT_AMBULATORY_CARE_PROVIDER_SITE_OTHER): Payer: Self-pay | Admitting: *Deleted

## 2022-03-25 NOTE — Telephone Encounter (Signed)
Pt called in needing to r/s procedure on for Wednesday. She has been moved to 12/22 at [facility] will send new instructions to mychart.

## 2022-04-01 ENCOUNTER — Telehealth (INDEPENDENT_AMBULATORY_CARE_PROVIDER_SITE_OTHER): Payer: Self-pay | Admitting: *Deleted

## 2022-04-01 NOTE — Telephone Encounter (Signed)
Pt called in. She is on for procedure 12/22 and she needed to r/s for the next week as she will be off.  Moved to 12/27 at 1215pm. Aware will send new instructions via mychart.

## 2022-04-18 ENCOUNTER — Encounter (HOSPITAL_COMMUNITY): Payer: Self-pay

## 2022-04-19 ENCOUNTER — Encounter (HOSPITAL_COMMUNITY)
Admission: RE | Admit: 2022-04-19 | Discharge: 2022-04-19 | Disposition: A | Discharge status: Home / Self Care | Payer: No Typology Code available for payment source | Source: Ambulatory Visit | Attending: Gastroenterology | Admitting: Gastroenterology

## 2022-04-19 HISTORY — DX: Unspecified asthma, uncomplicated: J45.909

## 2022-04-19 HISTORY — DX: Cardiac arrhythmia, unspecified: I49.9

## 2022-04-24 ENCOUNTER — Encounter (HOSPITAL_COMMUNITY)
Admission: RE | Disposition: A | Discharge status: Home / Self Care | Payer: Self-pay | Source: Home / Self Care | Attending: Gastroenterology

## 2022-04-24 ENCOUNTER — Ambulatory Visit (HOSPITAL_COMMUNITY)
Admission: RE | Admit: 2022-04-24 | Discharge: 2022-04-24 | Disposition: A | Discharge status: Home / Self Care | Payer: No Typology Code available for payment source | Attending: Gastroenterology | Admitting: Gastroenterology

## 2022-04-24 ENCOUNTER — Ambulatory Visit (HOSPITAL_COMMUNITY): Payer: No Typology Code available for payment source | Admitting: Anesthesiology

## 2022-04-24 ENCOUNTER — Encounter (HOSPITAL_COMMUNITY): Payer: Self-pay | Admitting: Gastroenterology

## 2022-04-24 ENCOUNTER — Other Ambulatory Visit: Payer: Self-pay

## 2022-04-24 ENCOUNTER — Ambulatory Visit (HOSPITAL_BASED_OUTPATIENT_CLINIC_OR_DEPARTMENT_OTHER): Payer: No Typology Code available for payment source | Admitting: Anesthesiology

## 2022-04-24 DIAGNOSIS — Z87891 Personal history of nicotine dependence: Secondary | ICD-10-CM

## 2022-04-24 DIAGNOSIS — K219 Gastro-esophageal reflux disease without esophagitis: Secondary | ICD-10-CM | POA: Insufficient documentation

## 2022-04-24 DIAGNOSIS — M797 Fibromyalgia: Secondary | ICD-10-CM | POA: Diagnosis not present

## 2022-04-24 DIAGNOSIS — K449 Diaphragmatic hernia without obstruction or gangrene: Secondary | ICD-10-CM | POA: Insufficient documentation

## 2022-04-24 DIAGNOSIS — J45909 Unspecified asthma, uncomplicated: Secondary | ICD-10-CM | POA: Insufficient documentation

## 2022-04-24 DIAGNOSIS — E785 Hyperlipidemia, unspecified: Secondary | ICD-10-CM | POA: Diagnosis not present

## 2022-04-24 DIAGNOSIS — N189 Chronic kidney disease, unspecified: Secondary | ICD-10-CM | POA: Insufficient documentation

## 2022-04-24 DIAGNOSIS — R1013 Epigastric pain: Secondary | ICD-10-CM | POA: Diagnosis not present

## 2022-04-24 DIAGNOSIS — I10 Essential (primary) hypertension: Secondary | ICD-10-CM

## 2022-04-24 DIAGNOSIS — E669 Obesity, unspecified: Secondary | ICD-10-CM | POA: Insufficient documentation

## 2022-04-24 DIAGNOSIS — I129 Hypertensive chronic kidney disease with stage 1 through stage 4 chronic kidney disease, or unspecified chronic kidney disease: Secondary | ICD-10-CM | POA: Insufficient documentation

## 2022-04-24 DIAGNOSIS — Z79899 Other long term (current) drug therapy: Secondary | ICD-10-CM | POA: Diagnosis not present

## 2022-04-24 DIAGNOSIS — G709 Myoneural disorder, unspecified: Secondary | ICD-10-CM | POA: Insufficient documentation

## 2022-04-24 DIAGNOSIS — Z6833 Body mass index (BMI) 33.0-33.9, adult: Secondary | ICD-10-CM | POA: Insufficient documentation

## 2022-04-24 HISTORY — PX: BIOPSY: SHX5522

## 2022-04-24 HISTORY — PX: ESOPHAGOGASTRODUODENOSCOPY (EGD) WITH PROPOFOL: SHX5813

## 2022-04-24 SURGERY — ESOPHAGOGASTRODUODENOSCOPY (EGD) WITH PROPOFOL
Anesthesia: General

## 2022-04-24 MED ORDER — LIDOCAINE HCL (CARDIAC) PF 100 MG/5ML IV SOSY
PREFILLED_SYRINGE | INTRAVENOUS | Status: DC | PRN
  Administered 2022-04-24: 50 mg via INTRAVENOUS

## 2022-04-24 MED ORDER — PROPOFOL 10 MG/ML IV BOLUS
INTRAVENOUS | Status: DC | PRN
  Administered 2022-04-24: 50 mg via INTRAVENOUS
  Administered 2022-04-24: 30 mg via INTRAVENOUS
  Administered 2022-04-24: 100 mg via INTRAVENOUS
  Administered 2022-04-24: 50 mg via INTRAVENOUS

## 2022-04-24 MED ORDER — LACTATED RINGERS IV SOLN: INTRAVENOUS | Status: DC

## 2022-04-24 NOTE — H&P (Signed)
[name] [name] is an 61 y.o. female.   Chief Complaint: Abdominal pain and nausea HPI: 61 year old female with past medical history of fibromyalgia, GERD, brain aneurysm, asthma, hypertension, hyperlipidemia, hiatal hernia, who comes to the evaluation of abdominal pain.  Patient has presented intermittent epigastric abdominal pain for the last 7 years intermittently.  He states that for the last 3 months this has worsened.  No melena, hematochezia, fever, chills.  Has presented some nausea but no vomiting.  sHe is currently on pantoprazole.  Past Medical History:  Diagnosis Date   Aneurysm ([place])    brain   Asthma    Chest pain    denied on 06/18/2012   Chronic kidney disease    L - upper pole- defect, doesn't effect      Dysrhythmia    Family history of anesthesia complication    brother & neice "flat lined" during induction: neice d/t a medication given for a blood clotting problem; brother d/t "miscalculated amount of anesthesia"   Fibromyalgia    GERD (gastroesophageal reflux disease)    h/o colitis    H. pylori infection    2012- stomach biopsy   H/O hiatal hernia    Headache(784.0)    using tylenol prn   Hyperlipidemia    Hypertension    Hypoglycemia    Normal cardiac stress test    pt. released fr. Dr. [name] care in 10/2011, all findings w/i normal limits   Overweight(278.02)    Obesity   Palpitations    Sinusitis, acute    seen by PCP- 06/17/2012, will treat /w augmentin & tussin/DM   Uterine fibroid    pt. states that she has a "closed cervix"     Past Surgical History:  Procedure Laterality Date   BIOPSY  04/14/2019   Procedure: BIOPSY;  Surgeon: Rogene Houston, MD;  Location: [facility];  Service: Endoscopy;;  antrum   CHOLECYSTECTOMY N/A 04/28/2019   Procedure: LAPAROSCOPIC CHOLECYSTECTOMY;  Surgeon: Aviva Signs, MD;  Location: [facility];  Service: General;  Laterality: N/A;   COLONOSCOPY     COLONOSCOPY N/A 11/26/2018   Procedure: COLONOSCOPY;   Surgeon: Rogene Houston, MD;  Location: [facility];  Service: Endoscopy;  Laterality: N/A;  1:00-12:00pm   CRANIOTOMY Right 06/24/2012   Procedure: CRANIOTOMY INTRACRANIAL ANEURYSM FOR CAROTID;  Surgeon: [name], MD;  Location: [facility];  Service: Neurosurgery;  Laterality: Right;  RIGHT Pterional Craniotomy for aneurysm   ESOPHAGOGASTRODUODENOSCOPY N/A 04/14/2019   Procedure: ESOPHAGOGASTRODUODENOSCOPY (EGD) WITH POSSIBLE ESOPHAGEAL DILATION;  Surgeon: Rogene Houston, MD;  Location: [facility];  Service: Endoscopy;  Laterality: N/A;  300pm   LIPOMA EXCISION     buttock 10/2010    Family History  Problem Relation Age of Onset   Hypertension Other    Coronary artery disease Other    Dementia Mother    Dementia Maternal Aunt    Social History:  reports that she quit smoking about 18 years ago. Her smoking use included cigarettes. She started smoking about 33 years ago. She has a 12.00 pack-year smoking history. She has never used smokeless tobacco. She reports that she does not drink alcohol and does not use drugs.  Allergies:  Allergies  Allergen Reactions   Ace Inhibitors Swelling   Azithromycin Anaphylaxis, Shortness Of Breath and Swelling    Breathing difficulty, swelling   Nsaids     Kidney disease   Other Swelling, Other (See Comments) and Cough    Sneezing allergy to  cats/grass/dust   Statins Other (See Comments)    Cramping, muscle pain. Tolerates Crestor     Ciprofloxacin Nausea And Vomiting    Acute kidney failure    Medrol [Methylprednisolone] Palpitations    Heart racing, increased BP   Tetracyclines & Related Palpitations    Heart racing, increased bp    Medications Prior to Admission  Medication Sig Dispense Refill   acetaminophen (TYLENOL) 650 MG CR tablet Take 650 mg by mouth 2 (two) times daily as needed for pain.     albuterol (VENTOLIN HFA) 108 (90 Base) MCG/ACT inhaler Inhale 1-2 puffs into the lungs every 4 (four) hours as needed for  wheezing or shortness of breath.      aspirin EC 81 MG tablet Take 81 mg by mouth daily at 12 noon.      DILT-XR 180 MG 24 hr capsule Take 180 mg by mouth at bedtime.     Doxylamine-Phenylephrine-APAP (VICKS SINEX DAYQUIL/NYQUIL PO) Take 1 Dose by mouth 2 (two) times daily as needed (congestion).     loratadine (CLARITIN) 10 MG tablet Take 10 mg by mouth daily at 12 noon.     Menthol-Methyl Salicylate (SALONPAS PAIN RELIEF PATCH EX) Apply 2-3 patches topically daily as needed (back / shoulder pain).     metoprolol succinate (TOPROL-XL) 50 MG 24 hr tablet Take 50 mg by mouth 2 (two) times daily.     pantoprazole (PROTONIX) 40 MG tablet Take 40 mg by mouth daily.     Polyethyl Glycol-Propyl Glycol (SYSTANE OP) Place 1 drop into both eyes daily.     rosuvastatin (CRESTOR) 10 MG tablet Take 10 mg by mouth every other day. At 12 noon     metoprolol tartrate (LOPRESSOR) 50 MG tablet Take 1 tablet (50 mg total) by mouth 2 (two) times daily. (Patient not taking: Reported on 04/23/2022) 90 tablet 1   omeprazole (PRILOSEC) 40 MG capsule Take 1 capsule (40 mg total) by mouth daily. (Patient not taking: Reported on 04/23/2022) 30 capsule 3   potassium chloride SA (KLOR-CON M20) 20 MEQ tablet Take 1 tablet (20 mEq total) by mouth at bedtime. (Patient not taking: Reported on 02/19/2022) 30 tablet 6    No results found for this or any previous visit (from the past 48 hour(s)). No results found.  Review of Systems  Constitutional: Negative.   HENT: Negative.    Eyes: Negative.   Respiratory: Negative.    Cardiovascular: Negative.   Gastrointestinal:  Positive for abdominal pain.  Endocrine: Negative.   Genitourinary: Negative.   Musculoskeletal: Negative.   Skin: Negative.   Allergic/Immunologic: Negative.   Neurological: Negative.   Hematological: Negative.   Psychiatric/Behavioral: Negative.      Blood pressure (!) 156/86, pulse 71, temperature 99.6 F (37.6 C), temperature source Oral, resp.  rate 15, last menstrual period 08/25/2012, SpO2 100 %. Physical Exam  GENERAL: The patient is AO x3, in no acute distress. HEENT: Head is normocephalic and atraumatic. EOMI are intact. Mouth is well hydrated and without lesions. NECK: Supple. No masses LUNGS: Clear to auscultation. No presence of rhonchi/wheezing/rales. Adequate chest expansion HEART: RRR, normal s1 and s2. ABDOMEN: Soft, nontender, no guarding, no peritoneal signs, and nondistended. BS +. No masses. EXTREMITIES: Without any cyanosis, clubbing, rash, lesions or edema. NEUROLOGIC: AOx3, no focal motor deficit. SKIN: no jaundice, no rashes  Assessment/Plan  61 year old female with past medical history of fibromyalgia, GERD, brain aneurysm, asthma, hypertension, hyperlipidemia, hiatal hernia, who comes to the evaluation of abdominal pain.  Will proceed with EGD.  Harvel Quale, MD 04/24/2022, 9:07 AM

## 2022-04-24 NOTE — Transfer of Care (Signed)
Immediate Anesthesia Transfer of Care Note  Patient: [name] [name]  Procedure(s) Performed: ESOPHAGOGASTRODUODENOSCOPY (EGD) WITH PROPOFOL BIOPSY  Patient Location: Short Stay  Anesthesia Type:General  Level of Consciousness: awake  Airway & Oxygen Therapy: Patient Spontanous Breathing  Post-op Assessment: Report given to RN and Post -op Vital signs reviewed and stable  Post vital signs: Reviewed and stable  Last Vitals:  Vitals Value Taken Time  BP 130/66 04/24/22 0930  Temp 36.7 C 04/24/22 0930  Pulse 60 04/24/22 0930  Resp 18 04/24/22 0930  SpO2 95 % 04/24/22 0930    Last Pain:  Vitals:   04/24/22 0930  TempSrc: Axillary  PainSc: 0-No pain      Patients Stated Pain Goal: 10 (92/95/[id number])  Complications: No notable events documented.

## 2022-04-24 NOTE — Anesthesia Preprocedure Evaluation (Signed)
Anesthesia Evaluation  Patient identified by MRN, date of birth, ID band Patient awake    Reviewed: Allergy & Precautions, H&P , NPO status , Patient's Chart, lab work & pertinent test results, reviewed documented beta blocker date and time   History of Anesthesia Complications (+) Family history of anesthesia reaction and history of anesthetic complications  Airway Mallampati: II  TM Distance: >3 FB Neck ROM: Full    Dental  (+) Dental Advisory Given, Teeth Intact   Pulmonary asthma , former smoker   Pulmonary exam normal breath sounds clear to auscultation       Cardiovascular hypertension, Pt. on medications and Pt. on home beta blockers Normal cardiovascular exam+ dysrhythmias  Rhythm:Regular Rate:Normal  1. Left ventricular ejection fraction, by estimation, is 65 to 70%. The  left ventricle has normal function. The left ventricle has no regional  wall motion abnormalities. Left ventricular diastolic parameters are  indeterminate.   2. Right ventricular systolic function is normal. The right ventricular  size is normal. There is normal pulmonary artery systolic pressure. The  estimated right ventricular systolic pressure is 15.8 mmHg.   3. The mitral valve is grossly normal. Trivial mitral valve  regurgitation.   4. The aortic valve is tricuspid. Aortic valve regurgitation is not  visualized.   5. The inferior vena cava is normal in size with greater than 50%  respiratory variability, suggesting right atrial pressure of 3 mmHg.     Neuro/Psych  Headaches  Neuromuscular disease CVA (h/o intracranial aneurysm, craniotomy - 2014)  negative psych ROS   GI/Hepatic Neg liver ROS, hiatal hernia,GERD  Medicated,,  Endo/Other  negative endocrine ROS    Renal/GU Renal InsufficiencyRenal disease  negative genitourinary   Musculoskeletal  (+)  Fibromyalgia -  Abdominal   Peds negative pediatric ROS (+)   Hematology negative hematology ROS (+)   Anesthesia Other Findings   Reproductive/Obstetrics negative OB ROS                             Anesthesia Physical Anesthesia Plan  ASA: 3  Anesthesia Plan: General   Post-op Pain Management: Minimal or no pain anticipated   Induction: Intravenous  PONV Risk Score and Plan: 1 and Propofol infusion  Airway Management Planned: Nasal Cannula and Natural Airway  Additional Equipment:   Intra-op Plan:   Post-operative Plan:   Informed Consent: I have reviewed the patients History and Physical, chart, labs and discussed the procedure including the risks, benefits and alternatives for the proposed anesthesia with the patient or authorized representative who has indicated his/her understanding and acceptance.     Dental advisory given  Plan Discussed with: CRNA and Surgeon  Anesthesia Plan Comments:         Anesthesia Quick Evaluation

## 2022-04-24 NOTE — Anesthesia Postprocedure Evaluation (Signed)
Anesthesia Post Note  Patient: [name] [name]  Procedure(s) Performed: ESOPHAGOGASTRODUODENOSCOPY (EGD) WITH PROPOFOL BIOPSY  Patient location during evaluation: Phase II Anesthesia Type: General Level of consciousness: awake and alert and oriented Pain management: pain level controlled Vital Signs Assessment: post-procedure vital signs reviewed and stable Respiratory status: spontaneous breathing, nonlabored ventilation and respiratory function stable Cardiovascular status: blood pressure returned to baseline and stable Postop Assessment: no apparent nausea or vomiting Anesthetic complications: no  No notable events documented.   Last Vitals:  Vitals:   04/24/22 0900 04/24/22 0930  BP: (!) 156/86 130/66  Pulse: 71 60  Resp: 15 18  Temp: 37.6 C 36.7 C  SpO2: 100% 95%    Last Pain:  Vitals:   04/24/22 0930  TempSrc: Axillary  PainSc: 0-No pain                 Cisco Kindt C Yann Biehn

## 2022-04-24 NOTE — Discharge Instructions (Signed)
You are being discharged to home.  Resume your previous diet.  We are waiting for your pathology results.  Consider switching to omeprazole 40 mg (recommended in previous appointment).

## 2022-04-24 NOTE — Op Note (Signed)
[facility] Patient Name: [name] [name] Procedure Date: 04/24/2022 9:00 AM MRN: [id number] Date of Birth: [birth year] Attending MD: Maylon Peppers , , [id number] CSN: [id number] Age: 61 Admit Type: Outpatient Procedure:                Upper GI endoscopy Indications:              Epigastric abdominal pain Providers:                Maylon Peppers, Janeece Riggers, RN, Lambert Mody, Raphael Gibney, Technician Referring MD:             Maylon Peppers Medicines:                Monitored Anesthesia Care Complications:            No immediate complications. Estimated Blood Loss:     Estimated blood loss: none. Procedure:                Pre-Anesthesia Assessment:                           - Prior to the procedure, a History and Physical                            was performed, and patient medications, allergies                            and sensitivities were reviewed. The patient's                            tolerance of previous anesthesia was reviewed.                           - The risks and benefits of the procedure and the                            sedation options and risks were discussed with the                            patient. All questions were answered and informed                            consent was obtained.                           - ASA Grade Assessment: II - A patient with mild                            systemic disease.                           After obtaining informed consent, the endoscope was                            passed under direct vision. Throughout the  procedure, the patient's blood pressure, pulse, and                            oxygen saturations were monitored continuously. The                            [id number]-H190 ([id number]) scope was introduced through the                            mouth, and advanced to the second part of duodenum.                            The upper GI  endoscopy was accomplished without                            difficulty. The patient tolerated the procedure                            well. Scope In: 9:20:10 AM Scope Out: 9:27:35 AM Total Procedure Duration: 0 hours 7 minutes 25 seconds  Findings:      A 1 cm hiatal hernia was present.      The entire examined stomach was normal. Biopsies were taken with a cold       forceps for Helicobacter pylori testing.      The examined duodenum was normal. Biopsies were taken with a cold       forceps for histology. Impression:               - 1 cm hiatal hernia.                           - Normal stomach. Biopsied.                           - Normal examined duodenum. Biopsied. Moderate Sedation:      Per Anesthesia Care Recommendation:           - Discharge patient to home (ambulatory).                           - Resume previous diet.                           - Await pathology results.                           - Consider switching to omeprazole 40 mg                            (recommended in previous appointment).                           - If no improvement, will consider low dose TCA for                            functional abdominal pain. Procedure Code(s):        --- Professional ---  95093, Esophagogastroduodenoscopy, flexible,                            transoral; with biopsy, single or multiple Diagnosis Code(s):        --- Professional ---                           K44.9, Diaphragmatic hernia without obstruction or                            gangrene                           R10.13, Epigastric pain CPT copyright 2022 American Medical Association. All rights reserved. The codes documented in this report are preliminary and upon coder review may  be revised to meet current compliance requirements. Maylon Peppers, MD Maylon Peppers,  04/24/2022 9:36:30 AM This report has been signed electronically. Number of Addenda: 0

## 2022-04-25 LAB — SURGICAL PATHOLOGY

## 2022-05-02 ENCOUNTER — Encounter (HOSPITAL_COMMUNITY): Payer: Self-pay | Admitting: Gastroenterology

## 2022-05-08 ENCOUNTER — Encounter (INDEPENDENT_AMBULATORY_CARE_PROVIDER_SITE_OTHER): Payer: Self-pay | Admitting: *Deleted

## 2022-05-23 ENCOUNTER — Telehealth (INDEPENDENT_AMBULATORY_CARE_PROVIDER_SITE_OTHER): Payer: Self-pay | Admitting: *Deleted

## 2022-05-23 NOTE — Telephone Encounter (Signed)
Spoke with the patient today, recommended to try Benefiber daily.  If this does not help with her bowel movements she will have to try MiraLAX on a daily basis.

## 2022-05-23 NOTE — Telephone Encounter (Signed)
Last seen 02/19/22. And had EGD on 04/24/22. Patient called today because she is having some small hard stools about the size of a dime. Reports going on for a while. Did not have abdominal pain til yesterday when she ate [place] food. Feels better today.  Has been eating fiber rich foods and drinks a lot of water. Would like to know if its normal for stools to change. It started when she changed her diet to eating less meat and more vegetables about one month ago. Also cardizem and metoprolol was change to extended release about the same time.   [phone]

## 2022-05-24 ENCOUNTER — Other Ambulatory Visit (INDEPENDENT_AMBULATORY_CARE_PROVIDER_SITE_OTHER): Payer: Self-pay | Admitting: Gastroenterology

## 2022-05-28 ENCOUNTER — Telehealth (INDEPENDENT_AMBULATORY_CARE_PROVIDER_SITE_OTHER): Payer: Self-pay

## 2022-05-28 NOTE — Telephone Encounter (Signed)
I returned the call to the patient at the number she left and asked that she please return call to the office if she still needs assistance.    Patient called today and left a message on the nurse line in the clinic, that she was not sick, and did not feel bad, but she is having some issues with her stools being loose and having large bm's and her stools color was yellowish to light brown. She states he has a history or diverticulitis.

## 2022-05-28 NOTE — Telephone Encounter (Signed)
You spoke with patient last week when she was having pebble like stool and you recommended benefiber. She reports her stools went to normal before she had a chance to start benefiber. Today she had one BM and it was yellowish. She is has abdominal cramping off and on under left rib. Reports it does not happen often. She checked temp and it was normal 98.8. she is concerned because she has history of diverticulitis and she has never had a yellowish stool. She is starting to get a headache behind her eye today.

## 2022-05-28 NOTE — Telephone Encounter (Signed)
Yellow stool is not a concerning issue for diverticulitis (usually is diarrhea with blood), she can keep monitoring her stools for now and we can follow in clinic. Needs to follow with PCP for eye pain

## 2022-05-29 NOTE — Telephone Encounter (Signed)
Discussed with patient and she verbalized understanding.  

## 2022-06-24 ENCOUNTER — Encounter (INDEPENDENT_AMBULATORY_CARE_PROVIDER_SITE_OTHER): Payer: Self-pay | Admitting: Gastroenterology

## 2022-06-24 ENCOUNTER — Ambulatory Visit (INDEPENDENT_AMBULATORY_CARE_PROVIDER_SITE_OTHER): Payer: Self-pay | Admitting: Gastroenterology

## 2022-06-24 VITALS — BP 132/82 | HR 70 | Temp 98.0°F | Ht 63.0 in | Wt 191.2 lb

## 2022-06-24 DIAGNOSIS — R194 Change in bowel habit: Secondary | ICD-10-CM

## 2022-06-24 NOTE — Patient Instructions (Signed)
Continue dietary changes  If abdominal discomfort comes back, start omeprazole

## 2022-06-24 NOTE — Progress Notes (Signed)
Maylon Peppers, M.D. Gastroenterology & Hepatology [facility] [address] [place], [place] [address]  Primary Care Physician: [name], MD [place] [id number]  I will communicate my assessment and recommendations to the referring MD via EMR.  Problems: Change in bowel movements, possibly related to IBS  History of Present Illness: [name] [name] is a 62 y.o. female with past medical history of asthma, brain aneurysm, fibromyalgia, history of H. pylori, hyperlipidemia, hypertension, who presents for follow up of change in bowel movements.  The patient was last seen on 02/19/2022. At that time, the patient was scheduled for EGD and was advised to switch to omeprazole 40 mg every day.  EGD was performed on 04/24/2022, found to have a 1 cm hiatal hernia.  Stomach and duodenum were within normal limits.  Biopsies showed normal gastric and small bowel biopsies.  Patient states that she was having pebble like stools a few days before her EGD. She started using more fiber and Miralax but did not have any significant improvement in her symptoms. Due to this she started using "baby food" and more fiber in diet, mainly with fruits and vegetables. Has had regular formed stools for the last 3 days.  She has not started omeprazole yet.  Reported her abdominal complaints resolved and she is not having any abdominal discomfort or other complaints such as having any nausea, vomiting, fever, chills, hematochezia, melena, hematemesis, abdominal distention, diarrhea, jaundice, pruritus or weight loss.  Last CT of the abdomen pelvis with IV contrast on 05/31/2019 was within normal limits.  Last EGD: As above Last Colonoscopy: :11/26/18- Diverticulosis in the entire examined colon. - External hemorrhoids. - No specimens collected.  Advised to repeat in 10 years.  Past Medical History: Past Medical History:  Diagnosis Date   Aneurysm ([place])     brain   Asthma    Chest pain    denied on 06/18/2012   Chronic kidney disease    L - upper pole- defect, doesn't effect      Dysrhythmia    Family history of anesthesia complication    brother & neice "flat lined" during induction: neice d/t a medication given for a blood clotting problem; brother d/t "miscalculated amount of anesthesia"   Fibromyalgia    GERD (gastroesophageal reflux disease)    h/o colitis    H. pylori infection    2012- stomach biopsy   H/O hiatal hernia    Headache(784.0)    using tylenol prn   Hyperlipidemia    Hypertension    Hypoglycemia    Normal cardiac stress test    pt. released fr. Dr. [name] care in 10/2011, all findings w/i normal limits   Overweight(278.02)    Obesity   Palpitations    Sinusitis, acute    seen by PCP- 06/17/2012, will treat /w augmentin & tussin/DM   Uterine fibroid    pt. states that she has a "closed cervix"     Past Surgical History: Past Surgical History:  Procedure Laterality Date   BIOPSY  04/14/2019   Procedure: BIOPSY;  Surgeon: Rogene Houston, MD;  [facility];  Service: Endoscopy;;  antrum   BIOPSY  04/24/2022   Procedure: BIOPSY;  Surgeon: Harvel Quale, MD;  [facility];  Service: Gastroenterology;;   CHOLECYSTECTOMY N/A 04/28/2019   Procedure: LAPAROSCOPIC CHOLECYSTECTOMY;  Surgeon: Aviva Signs, MD;  Location: [facility];  Service: General;  Laterality: N/A;   COLONOSCOPY  COLONOSCOPY N/A 11/26/2018   Procedure: COLONOSCOPY;  Surgeon: Rogene Houston, MD;  [facility];  Service: Endoscopy;  Laterality: N/A;  1:00-12:00pm   CRANIOTOMY Right 06/24/2012   Procedure: CRANIOTOMY INTRACRANIAL ANEURYSM FOR CAROTID;  Surgeon: [name], MD;  Location: [facility];  Service: Neurosurgery;  Laterality: Right;  RIGHT Pterional Craniotomy for aneurysm   ESOPHAGOGASTRODUODENOSCOPY N/A 04/14/2019   Procedure: ESOPHAGOGASTRODUODENOSCOPY (EGD) WITH POSSIBLE ESOPHAGEAL  DILATION;  Surgeon: Rogene Houston, MD;  [facility];  Service: Endoscopy;  Laterality: N/A;  300pm   ESOPHAGOGASTRODUODENOSCOPY (EGD) WITH PROPOFOL N/A 04/24/2022   Procedure: ESOPHAGOGASTRODUODENOSCOPY (EGD) WITH PROPOFOL;  Surgeon: Harvel Quale, MD;  [facility];  Service: Gastroenterology;  Laterality: N/A;  1245 ASA 2   LIPOMA EXCISION     buttock 10/2010    Family History: Family History  Problem Relation Age of Onset   Hypertension Other    Coronary artery disease Other    Dementia Mother    Dementia Maternal Aunt     Social History: Social History   Tobacco Use  Smoking Status Former   Packs/day: 0.80   Years: 15.00   Total pack years: 12.00   Types: Cigarettes   Start date: 02/13/1989   Quit date: 04/28/2004   Years since quitting: 18.1  Smokeless Tobacco Never   Social History   Substance and Sexual Activity  Alcohol Use No   Alcohol/week: 0.0 standard drinks of alcohol   Social History   Substance and Sexual Activity  Drug Use No    Allergies: Allergies  Allergen Reactions   Ace Inhibitors Swelling   Azithromycin Anaphylaxis, Shortness Of Breath and Swelling    Breathing difficulty, swelling   Nsaids     Kidney disease   Other Swelling, Other (See Comments) and Cough    Sneezing allergy to cats/grass/dust   Statins Other (See Comments)    Cramping, muscle pain. Tolerates Crestor     Ciprofloxacin Nausea And Vomiting    Acute kidney failure    Medrol [Methylprednisolone] Palpitations    Heart racing, increased BP   Tetracyclines & Related Palpitations    Heart racing, increased bp    Medications: Current Outpatient Medications  Medication Sig Dispense Refill   acetaminophen (TYLENOL) 650 MG CR tablet Take 650 mg by mouth 2 (two) times daily as needed for pain.     albuterol (VENTOLIN HFA) 108 (90 Base) MCG/ACT inhaler Inhale 1-2 puffs into the lungs every 4 (four) hours as needed for wheezing or  shortness of breath.      aspirin EC 81 MG tablet Take 81 mg by mouth daily at 12 noon.      DILT-XR 180 MG 24 hr capsule Take 180 mg by mouth at bedtime.     Doxylamine-Phenylephrine-APAP (VICKS SINEX DAYQUIL/NYQUIL PO) Take 1 Dose by mouth 2 (two) times daily as needed (congestion).     loratadine (CLARITIN) 10 MG tablet Take 10 mg by mouth daily at 12 noon.     Menthol-Methyl Salicylate (SALONPAS PAIN RELIEF PATCH EX) Apply 2-3 patches topically daily as needed (back / shoulder pain).     metoprolol succinate (TOPROL-XL) 50 MG 24 hr tablet Take 50 mg by mouth 2 (two) times daily.     omeprazole (PRILOSEC) 40 MG capsule TAKE 1 CAPSULE (40 MG TOTAL) BY MOUTH DAILY. 90 capsule 1   Polyethyl Glycol-Propyl Glycol (SYSTANE OP) Place 1 drop into both eyes daily.     rosuvastatin (CRESTOR) 10 MG  tablet Take 10 mg by mouth every other day. At 12 noon     No current facility-administered medications for this visit.    Review of Systems: GENERAL: negative for malaise, night sweats HEENT: No changes in hearing or vision, no nose bleeds or other nasal problems. NECK: Negative for lumps, goiter, pain and significant neck swelling RESPIRATORY: Negative for cough, wheezing CARDIOVASCULAR: Negative for chest pain, leg swelling, palpitations, orthopnea GI: SEE HPI MUSCULOSKELETAL: Negative for joint pain or swelling, back pain, and muscle pain. SKIN: Negative for lesions, rash PSYCH: Negative for sleep disturbance, mood disorder and recent psychosocial stressors. HEMATOLOGY Negative for prolonged bleeding, bruising easily, and swollen nodes. ENDOCRINE: Negative for cold or heat intolerance, polyuria, polydipsia and goiter. NEURO: negative for tremor, gait imbalance, syncope and seizures. The remainder of the review of systems is noncontributory.   Physical Exam: BP 132/82 (BP Location: Left Arm, Patient Position: Sitting, Cuff Size: Normal)   Pulse 70   Temp 98 F (36.7 C)   Ht '5\' 3"'$  (1.6 m)    Wt 191 lb 3.2 oz (86.7 kg) Comment: pt reported  LMP 08/25/2012   BMI 33.87 kg/m  GENERAL: The patient is AO x3, in no acute distress. HEENT: Head is normocephalic and atraumatic. EOMI are intact. Mouth is well hydrated and without lesions. NECK: Supple. No masses LUNGS: Clear to auscultation. No presence of rhonchi/wheezing/rales. Adequate chest expansion HEART: RRR, normal s1 and s2. ABDOMEN: Soft, nontender, no guarding, no peritoneal signs, and nondistended. BS +. No masses. EXTREMITIES: Without any cyanosis, clubbing, rash, lesions or edema. NEUROLOGIC: AOx3, no focal motor deficit. SKIN: no jaundice, no rashes  Imaging/Labs: as above  I personally reviewed and interpreted the available labs, imaging and endoscopic files.  Impression and Plan: [name] [name] is a 62 y.o. female with past medical history of asthma, brain aneurysm, fibromyalgia, history of H. pylori, hyperlipidemia, hypertension, who presents for follow up of change in bowel movements.  Patient had improvement of her bowel movement frequency and consistency after implementing dietary changes.  At the present any red flag signs.  Recent endoscopic evaluation did not show any concerning abnormalities in her upper gastrointestinal tract.  I advised her to continue with high-fiber intake.  If she were to have recurrent abdominal complaints, she can start the omeprazole for management of possible dyspepsia.  -Continue dietary changes  -If abdominal discomfort comes back, start omeprazole    All questions were answered.      Maylon Peppers, MD Gastroenterology and Hepatology [facility]

## 2022-07-09 ENCOUNTER — Ambulatory Visit: Payer: 59 | Admitting: Cardiology

## 2022-07-10 ENCOUNTER — Encounter: Payer: Self-pay | Admitting: Cardiology

## 2022-07-10 ENCOUNTER — Ambulatory Visit: Payer: No Typology Code available for payment source | Attending: Cardiology | Admitting: Cardiology

## 2022-07-10 VITALS — BP 138/78 | HR 62 | Ht 63.0 in | Wt 194.0 lb

## 2022-07-10 DIAGNOSIS — Z79899 Other long term (current) drug therapy: Secondary | ICD-10-CM

## 2022-07-10 DIAGNOSIS — I10 Essential (primary) hypertension: Secondary | ICD-10-CM | POA: Diagnosis not present

## 2022-07-10 DIAGNOSIS — R002 Palpitations: Secondary | ICD-10-CM

## 2022-07-10 MED ORDER — SPIRONOLACTONE 25 MG PO TABS: 12.5000 mg | ORAL_TABLET | Freq: Every day | ORAL | 2 refills | Status: DC

## 2022-07-10 NOTE — Patient Instructions (Addendum)
Medication Instructions:  Your physician has recommended you make the following change in your medication:  Start spironolactone 12.5 mg daily Continue other medications the same  Labwork: BMET in 2 weeks (07/24/2022) Non-fasting [facility]  Testing/Procedures: none  Follow-Up: Your physician recommends that you schedule a follow-up appointment in: 6 months  Any Other Special Instructions Will Be Listed Below (If Applicable).  If you need a refill on your cardiac medications before your next appointment, please call your pharmacy.

## 2022-07-10 NOTE — Progress Notes (Signed)
Clinical Summary [name] [name] is a 62 y.o.female seen today for follow up of the following medical problems.   1. Palpitations -  admit 03/2014 with palpitations and chest pain. On that day was hypertensive and tachycardic on admission. - MPI without evidence of ischemia, LVEF 78%. Echo "normal LVEF". TSH 1.4. Event monitor with occasional PACs, no significant arrhythmias  - holter 11/2016 no arrhythmias  - symptoms at times, overall tolerable. Av nodal agents have been limited by low normal HRs at times 50s to 60s     - admit 11/2021 with palpitations, AKI, hypoglyecemia to [facility] - K 3.1 Cr 1.21 BUN 18 Gluc 60 - EKG read as NSR rate 93 - 11/2021 echo [facility]: LVEF 60-65%, mild LAE - had been taking chlorthalidone every other day '25mg'$    - no recent palpitations     2. HTN - hydralazine previously stopped due to concerns for possible drug induced lupus. Symptoms better.   - her dilt was prevoiusly decreased, with this significant elvation in bp's and she went back to takeing '180mg'$  bid - has been on fairly high dose dual av nodal agents due to history of chronic palpitations, has tolerated regimen.      - diltiazem stopped during Jan 2020 admission with acute diverticulitis due to low bp's. Had also had some prior low heart rates. Chlothalidone stopped as well - pcp restarted chlorthalidione and diltiazem for recurrent high bp's     - recent urgent care visit sbp 170s, ER visit SBP 200s - recently on losartan, did not feel well and stopped taking    Other medical problems not addressed this visit   3. Diverticulitis - admitted Jan 2020 with abdominal pain and diarrhea, CT with evidecne of possible diverticulitis - treated with abx   4. Chest pain - long history of atypical chest pain  -  she completed a lexiscan 03/2014  that was overall low risk, small apical defect probable artifact. - seen at [facility] 10/17 with chest pain. CT PE negative.   07/2015 nuclear probable  apical thinning, cannot exlcude mild apical ischemia  - seen in ER 08/2017 with chest pain, worst with movement and cough. No evidence of ischemia by EKG or enzymes     05/31/2019 ER visit with epigastric/lower chest pain - from ER notes tender to palpation WBC 8.8 Hgb 13.4 K 3.1 Cr 1.17 hstrop 31-->26-->15 Ddimer 0.58  EKG SR no acute ishcemic changes - CT PE: no PE, no acute process - CT A/P no acute process   - recently had gallbladder removed , pain started about 1 day after - pressure epigastric and midchest, below sternum. Constant pain more than 10 hours. +tachycardia, elevated blood pressures. Pain in midback.  - has had issues with constipiation after surgery.      11/10/19 seen in ER with back pain, some radiation into chest - from ER notes symptoms resolved with GI cocktail - trop neg x 2, Ddimer neg. EKG SR, no ischeimc changes       09/2020 echo: LVEF 65-70%, no WMAs, indet diastolic function, normal RV  - chronic stable symptoms. Can be tender to palpation      5. Hyperlpidemia - labs followed by pcp - she is on crestor   SH: mom with recent stroke, currently at [facility]. Mother has come home and living with her, ongoing weakness on left side. Also has Alzheimers. Just turned 64, she is hospice. Past Medical History:  Diagnosis Date  Aneurysm ([place])    brain   Asthma    Chest pain    denied on 06/18/2012   Chronic kidney disease    L - upper pole- defect, doesn't effect      Dysrhythmia    Family history of anesthesia complication    brother & neice "flat lined" during induction: neice d/t a medication given for a blood clotting problem; brother d/t "miscalculated amount of anesthesia"   Fibromyalgia    GERD (gastroesophageal reflux disease)    h/o colitis    H. pylori infection    2012- stomach biopsy   H/O hiatal hernia    Headache(784.0)    using tylenol prn   Hyperlipidemia    Hypertension    Hypoglycemia    Normal cardiac stress test     pt. released fr. Dr. [name] care in 10/2011, all findings w/i normal limits   Overweight(278.02)    Obesity   Palpitations    Sinusitis, acute    seen by PCP- 06/17/2012, will treat /w augmentin & tussin/DM   Uterine fibroid    pt. states that she has a "closed cervix"      Allergies  Allergen Reactions   Ace Inhibitors Swelling   Azithromycin Anaphylaxis, Shortness Of Breath and Swelling    Breathing difficulty, swelling   Nsaids     Kidney disease   Other Swelling, Other (See Comments) and Cough    Sneezing allergy to cats/grass/dust   Statins Other (See Comments)    Cramping, muscle pain. Tolerates Crestor     Ciprofloxacin Nausea And Vomiting    Acute kidney failure    Medrol [Methylprednisolone] Palpitations    Heart racing, increased BP   Tetracyclines & Related Palpitations    Heart racing, increased bp     Current Outpatient Medications  Medication Sig Dispense Refill   acetaminophen (TYLENOL) 650 MG CR tablet Take 650 mg by mouth 2 (two) times daily as needed for pain.     albuterol (VENTOLIN HFA) 108 (90 Base) MCG/ACT inhaler Inhale 1-2 puffs into the lungs every 4 (four) hours as needed for wheezing or shortness of breath.      aspirin EC 81 MG tablet Take 81 mg by mouth daily at 12 noon.      DILT-XR 180 MG 24 hr capsule Take 180 mg by mouth at bedtime.     Doxylamine-Phenylephrine-APAP (VICKS SINEX DAYQUIL/NYQUIL PO) Take 1 Dose by mouth 2 (two) times daily as needed (congestion).     loratadine (CLARITIN) 10 MG tablet Take 10 mg by mouth daily at 12 noon.     Menthol-Methyl Salicylate (SALONPAS PAIN RELIEF PATCH EX) Apply 2-3 patches topically daily as needed (back / shoulder pain).     metoprolol succinate (TOPROL-XL) 50 MG 24 hr tablet Take 50 mg by mouth 2 (two) times daily.     omeprazole (PRILOSEC) 40 MG capsule TAKE 1 CAPSULE (40 MG TOTAL) BY MOUTH DAILY. 90 capsule 1   Polyethyl Glycol-Propyl Glycol (SYSTANE OP) Place 1 drop into both eyes daily.      rosuvastatin (CRESTOR) 10 MG tablet Take 10 mg by mouth every other day. At 12 noon     No current facility-administered medications for this visit.     Past Surgical History:  Procedure Laterality Date   BIOPSY  04/14/2019   Procedure: BIOPSY;  Surgeon: Rogene Houston, MD;  [facility];  Service: Endoscopy;;  antrum   BIOPSY  04/24/2022   Procedure: BIOPSY;  Surgeon: Jenetta Downer  Roney Marion, MD;  [facility];  Service: Gastroenterology;;   CHOLECYSTECTOMY N/A 04/28/2019   Procedure: LAPAROSCOPIC CHOLECYSTECTOMY;  Surgeon: Aviva Signs, MD;  Location[facility];  Service: General;  Laterality: N/A;   COLONOSCOPY     COLONOSCOPY N/A 11/26/2018   Procedure: COLONOSCOPY;  Surgeon: Rogene Houston, MD;  [facility];  Service: Endoscopy;  Laterality: N/A;  1:00-12:00pm   CRANIOTOMY Right 06/24/2012   Procedure: CRANIOTOMY INTRACRANIAL ANEURYSM FOR CAROTID;  Surgeon: [name], MD;  Location: [facility];  Service: Neurosurgery;  Laterality: Right;  RIGHT Pterional Craniotomy for aneurysm   ESOPHAGOGASTRODUODENOSCOPY N/A 04/14/2019   Procedure: ESOPHAGOGASTRODUODENOSCOPY (EGD) WITH POSSIBLE ESOPHAGEAL DILATION;  Surgeon: Rogene Houston, MD;  [facility];  Service: Endoscopy;  Laterality: N/A;  300pm   ESOPHAGOGASTRODUODENOSCOPY (EGD) WITH PROPOFOL N/A 04/24/2022   Procedure: ESOPHAGOGASTRODUODENOSCOPY (EGD) WITH PROPOFOL;  Surgeon: Harvel Quale, MD;  [facility];  Service: Gastroenterology;  Laterality: N/A;  1245 ASA 2   LIPOMA EXCISION     buttock 10/2010     Allergies  Allergen Reactions   Ace Inhibitors Swelling   Azithromycin Anaphylaxis, Shortness Of Breath and Swelling    Breathing difficulty, swelling   Nsaids     Kidney disease   Other Swelling, Other (See Comments) and Cough    Sneezing allergy to cats/grass/dust   Statins Other (See Comments)    Cramping, muscle pain. Tolerates Crestor      Ciprofloxacin Nausea And Vomiting    Acute kidney failure    Medrol [Methylprednisolone] Palpitations    Heart racing, increased BP   Tetracyclines & Related Palpitations    Heart racing, increased bp      Family History  Problem Relation Age of Onset   Hypertension Other    Coronary artery disease Other    Dementia Mother    Dementia Maternal Aunt      Social History [name] [name] reports that she quit smoking about 18 years ago. Her smoking use included cigarettes. She started smoking about 33 years ago. She has a 12.00 pack-year smoking history. She has never used smokeless tobacco. Ms. Raimo reports no history of alcohol use.   Review of Systems CONSTITUTIONAL: No weight loss, fever, chills, weakness or fatigue.  HEENT: Eyes: No visual loss, blurred vision, double vision or yellow sclerae.No hearing loss, sneezing, congestion, runny nose or sore throat.  SKIN: No rash or itching.  CARDIOVASCULAR: per hpi RESPIRATORY: No shortness of breath, cough or sputum.  GASTROINTESTINAL: No anorexia, nausea, vomiting or diarrhea. No abdominal pain or blood.  GENITOURINARY: No burning on urination, no polyuria NEUROLOGICAL: No headache, dizziness, syncope, paralysis, ataxia, numbness or tingling in the extremities. No change in bowel or bladder control.  MUSCULOSKELETAL: No muscle, back pain, joint pain or stiffness.  LYMPHATICS: No enlarged nodes. No history of splenectomy.  PSYCHIATRIC: No history of depression or anxiety.  ENDOCRINOLOGIC: No reports of sweating, cold or heat intolerance. No polyuria or polydipsia.  [name]   Physical Examination Today's Vitals   07/10/22 1420  BP: 138/78  Pulse: 62  SpO2: 98%  Weight: 194 lb (88 kg)  Height: '5\' 3"'$  (1.6 m)   Body mass index is 34.37 kg/m.  Gen: resting comfortably, no acute distress HEENT: no scleral icterus, pupils equal round and reactive, no palptable cervical adenopathy,  CV: RRR, no m/r,g no jvd Resp: Clear to  auscultation bilaterally GI: abdomen is soft, non-tender, non-distended, normal bowel sounds, no hepatosplenomegaly MSK:  extremities are warm, no edema.  Skin: warm, no rash Neuro:  no focal deficits Psych: appropriate affect   Diagnostic Studies  05/2009 Nuclear stress No ischemia   03/2014 Echo Study Conclusions  - Left ventricle: The cavity size was normal. Systolic function was   normal. Wall motion was normal; there were no regional wall   motion abnormalities.   03/2014 Lexiscan MPI IMPRESSION: 1. No reversible ischemia or infarction.   2. Normal left ventricular wall motion.   3. Left ventricular ejection fraction 78%   4. Low-risk stress test findings*.   03/2014 Event Monitor Symptoms correlate with NSR. One episode of fluttering showed 2 PACs. No significant arrhythmias   07/2015 Lexiscan MPI There was no ST segment deviation noted during stress. This is a low risk study. The left ventricular ejection fraction is mildly decreased (45-54%). LVEF is 49% just slightly below normal, visually appears normal. Consider correlating with echo. There is a small mild intensity distal anterior and apical defect with mild reversibility. The apex has normal wall motion. Finding is likely secondary to apical thinning, there is also significant radiotracer uptake in the adjacent gut which may create some artifact. Cannot rule would mild apical ischemia. Overall findings are low risk.       11/2016 holter Min HR 42, Max HR 94, Avg HR 57 No supraventricular or ventricular ectopy Telemetry tracings show sinus bradycardia and sinus rhythm No symptoms reported No significant arrhythmias     Assessment and Plan  1.HTN - recent elevated bp's - medical therapy has been complicated by side effects - ACE and ARB allergy. AKI and hypokalemia on chlorthalidone. Concerns for drug induced lupus on hydralazine. Already on beta blocker, diltiazem given history of palpitations - will  start aldactone 12.'5mg'$  daily, check bmet 2 weeks - a future option could be changing toprol to more bp effective beta blocker like coreg or labetalol    2. Palpitations - no symptoms, has required fairly high doses of both CCB and BB to control symptoms   F/u 6 months          Arnoldo Lenis, M.D.,

## 2022-10-29 ENCOUNTER — Ambulatory Visit (HOSPITAL_BASED_OUTPATIENT_CLINIC_OR_DEPARTMENT_OTHER): Payer: No Typology Code available for payment source | Admitting: Pulmonary Disease

## 2022-10-29 ENCOUNTER — Encounter (HOSPITAL_BASED_OUTPATIENT_CLINIC_OR_DEPARTMENT_OTHER): Payer: Self-pay | Admitting: Pulmonary Disease

## 2022-10-29 VITALS — BP 120/82 | HR 56 | Temp 98.8°F | Ht 63.0 in | Wt 196.8 lb

## 2022-10-29 DIAGNOSIS — G4733 Obstructive sleep apnea (adult) (pediatric): Secondary | ICD-10-CM

## 2022-10-29 NOTE — Progress Notes (Signed)
[facility]  Chief Complaint  Patient presents with   Consult    Patient here to talk about her sleep apnea.     Past Surgical History:  She  has a past surgical history that includes Colonoscopy; Lipoma excision; Craniotomy (Right, 06/24/2012); Colonoscopy (N/A, 11/26/2018); Esophagogastroduodenoscopy (N/A, 04/14/2019); biopsy (04/14/2019); Cholecystectomy (N/A, 04/28/2019); Esophagogastroduodenoscopy (egd) with propofol (N/A, 04/24/2022); and biopsy (04/24/2022).  Past Medical History:  Asthma, Fibromyalgia, GERD, Hiatal hernia, HLD, HTN, CKD 3a, Rt ICA aneurysm s/p clipping, Migraine headache  Constitutional:  BP 120/82 (BP Location: Right Arm, Patient Position: Sitting, Cuff Size: Normal)   Pulse (!) 56   Temp 98.8 F (37.1 C) (Oral)   Ht 5\' 3"  (1.6 m)   Wt 196 lb 12.8 oz (89.3 kg)   LMP 08/25/2012   SpO2 99%   BMI 34.86 kg/m   Brief Summary:  [name] [name] is a 62 y.o. female with obstructive sleep apnea.      Subjective:   She is followed by nephrology for refractory hypertension and CKD.  She had a sleep study in 2017 that showed mild sleep apnea.  She was never started on therapy for this.  She has persistent daytime sleepiness and snores.  She was advised to have further sleep assessment.  She has fallen asleep at work.  Her brother has sleep apnea.    She goes to sleep at 11 pm.  She falls asleep after a while.  She wakes up 1 or 2 times to use the bathroom.  She gets out of bed at .  She feels 5 am in the morning.  She sometimes gets a headache.  She does not use anything to help her fall sleep or stay awake.  She gets cramps in her legs several times per month while asleep.  She denies sleep walking, sleep talking, bruxism, or nightmares.  There is no history of restless legs.  She denies sleep hallucinations, sleep paralysis, or cataplexy.  The Epworth score is 13 out of 24.   Physical Exam:   Appearance - well  kempt   ENMT - no sinus tenderness, no oral exudate, no LAN, Mallampati 4 airway, no stridor  Respiratory - equal breath sounds bilaterally, no wheezing or rales  CV - s1s2 regular rate and rhythm, no murmurs  Ext - no clubbing, no edema  Skin - no rashes  Psych - normal mood and affect   Sleep Tests:  PSG 10/09/15 >> AHI 5.2, SpO2 low 86%  Cardiac Tests:  Echo 10/05/20 >> EF 65 to 70%  Social History:  She  reports that she quit smoking about 18 years ago. Her smoking use included cigarettes. She started smoking about 33 years ago. She has a 12.00 pack-year smoking history. She has never used smokeless tobacco. She reports that she does not drink alcohol and does not use drugs.  Family History:  Her family history includes Coronary artery disease in an other family member; Dementia in her maternal aunt and mother; Hypertension in an other family member.    Discussion:  She has snoring, sleep disruption, apnea, and daytime sleepiness.  She has history of hypertension.  Previous sleep study showed obstructive sleep apnea.  I am concerned she still has obstructive sleep apnea.  Assessment/Plan:   Snoring with excessive daytime sleepiness. - will need to arrange for a home sleep study  Obesity. - discussed how weight can impact sleep and risk for sleep disordered breathing - discussed options to  assist with weight loss: combination of diet modification, cardiovascular and strength training exercises  Cardiovascular risk. - had an extensive discussion regarding the adverse health consequences related to untreated sleep disordered breathing - specifically discussed the risks for hypertension, coronary artery disease, cardiac dysrhythmias, cerebrovascular disease, and diabetes - lifestyle modification discussed  Safe driving practices. - discussed how sleep disruption can increase risk of accidents, particularly when driving - safe driving practices were discussed  Therapies  for obstructive sleep apnea. - if the sleep study shows significant sleep apnea, then various therapies for treatment were reviewed: CPAP, oral appliance, and surgical interventions  Time Spent Involved in Patient Care on Day of Examination:  36 minutes  Follow up:   Patient Instructions  Will arrange for a home sleep study Will call to arrange for follow up after sleep study reviewed   Medication List:   Allergies as of 10/29/2022       Reactions   Ace Inhibitors Swelling   Azithromycin Anaphylaxis, Shortness Of Breath, Swelling   Breathing difficulty, swelling   Losartan Anaphylaxis   Patient had lip swelling   Hydralazine Other (See Comments)   Drug-induced Lupus   Nsaids    Kidney disease   Other Swelling, Other (See Comments), Cough   Sneezing allergy to cats/grass/dust   Statins Other (See Comments)   Cramping, muscle pain. Tolerates Crestor    Ciprofloxacin Nausea And Vomiting   Acute kidney failure    Medrol [methylprednisolone] Palpitations   Heart racing, increased BP   Tetracyclines & Related Palpitations   Heart racing, increased bp        Medication List        Accurate as of October 29, 2022 10:34 AM. If you have any questions, ask your nurse or doctor.          acetaminophen 650 MG CR tablet Commonly known as: TYLENOL Take 650 mg by mouth 2 (two) times daily as needed for pain.   albuterol 108 (90 Base) MCG/ACT inhaler Commonly known as: VENTOLIN HFA Inhale 1-2 puffs into the lungs every 4 (four) hours as needed for wheezing or shortness of breath.   aspirin EC 81 MG tablet Take 81 mg by mouth daily at 12 noon.   Dilt-XR 180 MG 24 hr capsule Generic drug: diltiazem Take 180 mg by mouth at bedtime.   loratadine 10 MG tablet Commonly known as: CLARITIN Take 10 mg by mouth daily at 12 noon.   metoprolol succinate 50 MG 24 hr tablet Commonly known as: TOPROL-XL Take 50 mg by mouth 2 (two) times daily.   omeprazole 40 MG capsule Commonly  known as: PRILOSEC TAKE 1 CAPSULE (40 MG TOTAL) BY MOUTH DAILY.   rosuvastatin 10 MG tablet Commonly known as: CRESTOR Take 10 mg by mouth every other day. At 12 noon   SALONPAS PAIN RELIEF PATCH EX Apply 2-3 patches topically daily as needed (back / shoulder pain).   spironolactone 25 MG tablet Commonly known as: ALDACTONE Take 0.5 tablets (12.5 mg total) by mouth daily.   SYSTANE OP Place 1 drop into both eyes daily.   VICKS SINEX DAYQUIL/NYQUIL PO Take 1 Dose by mouth 2 (two) times daily as needed (congestion).        Signature:  Coralyn Helling, MD [facility] Pager - [phone] 10/29/2022, 10:34 AM

## 2022-10-29 NOTE — Patient Instructions (Signed)
Will arrange for a home sleep study Will call to arrange for follow up after sleep study reviewed  

## 2022-10-30 ENCOUNTER — Institutional Professional Consult (permissible substitution): Payer: No Typology Code available for payment source | Admitting: Internal Medicine

## 2022-11-13 ENCOUNTER — Telehealth: Payer: Self-pay | Admitting: Neurology

## 2022-11-13 ENCOUNTER — Encounter: Payer: Self-pay | Admitting: Neurology

## 2022-11-13 ENCOUNTER — Ambulatory Visit: Payer: No Typology Code available for payment source | Admitting: Neurology

## 2022-11-13 VITALS — BP 116/79 | HR 68 | Ht 63.0 in | Wt 196.2 lb

## 2022-11-13 DIAGNOSIS — Z8679 Personal history of other diseases of the circulatory system: Secondary | ICD-10-CM | POA: Diagnosis not present

## 2022-11-13 DIAGNOSIS — R519 Headache, unspecified: Secondary | ICD-10-CM

## 2022-11-13 DIAGNOSIS — I10 Essential (primary) hypertension: Secondary | ICD-10-CM

## 2022-11-13 DIAGNOSIS — M5412 Radiculopathy, cervical region: Secondary | ICD-10-CM

## 2022-11-13 DIAGNOSIS — I671 Cerebral aneurysm, nonruptured: Secondary | ICD-10-CM

## 2022-11-13 NOTE — Progress Notes (Signed)
Chart reviewed, agree above plan ?

## 2022-11-13 NOTE — Telephone Encounter (Signed)
sent to GI they obtain [employer] auth [id number] 

## 2022-11-13 NOTE — Patient Instructions (Signed)
I will order MRI of the brain and MRA.  Have labs done at [facility].  Orders are in for [facility].  Please let me know if you have any questions.  Thanks!!

## 2022-11-13 NOTE — Progress Notes (Signed)
Chief Complaint  Patient presents with   Follow-up    Rm17 alone HA: 15 ANEURYSM: 10 year anniversary in feb. Pt feels like she has been doing well but reports left side behind eye has pain radiating through neck behind ear. Paraesthesias Numbness, weird sensation in bilateral hands   Assessment and plan  1. History of right internal carotid artery terminus aneurysm clipping 2. Frequent headaches with migraine features, new headache on the left side, retro-orbital 3.  Hypertension  -Check MRI of the brain, MRA head to rule out acute abnormality, she is very concerned with new headache type in the setting of history of aneurysm clipping.  Will do without contrast due to kidney disease.  Clip is titanium, last had MRI in 2022 at [facility]. -Check ESR, CRP given new headache type rule out temporal arteritis. Will have labs drawn at [facility] -Headache sounds migrainous, but with above history will undergo further evaluation.  Has not wanted to be on preventative medication.  Also underlying hypertension could be playing a role in headache, along with stress.  Her neurological exam is reassuring. -Follow-up in 1 year or sooner if needed   HISTORY Jadon Hart-Lowe, seen in request by  I reviewed and summarized the referring note. PMH HTN,  HLD,  right ICA terminus aneurysm s/p clipping in 2013   Patient was following with Dr.penumalli in 2013 for headache. At that time, she complained of intermittent headache, right or left-sided, associated with neck pain, left shoulder pain, nausea, blurred vision, and dizziness. Headaches were proceeded by feeding of sore spots over the scalp. She took Tylenol for headache. However she had MRI and MRA at that time showed 45 mm terminal right ICA aneurysm. MRI C-spine showed minimal degenerative changes without significant spinal stenosis. She underwent aneurysm clipping by Dr. Mikal Plane and after that her headache was gone for several years.   Since 2019, her  headache gradually coming back, her headache was very similar to previous headache, starting with lower neck pain, bilateral shoulder pain, up to the back of her head, either left or right-sided with neck tightness. Headaches are more pressure like, dull feeling, sometimes lasting 2-3 days. Tylenol works if takes early. For the last 2 weeks, headache seems getting worse. At that same time she had right hand cramping after overexerting the right hand in a party, as well as sometimes left arm tingling. Went to see PCP, had a stress test which was negative and also had MRI/MRA showed previous aneurysm clipping without change, however, also showed symmetric mild signal along bilateral corticospinal tracts, progressed from 2015. It reported this finding is nonspecific however may be seen associated with ALS. Due to this abnormal findings, patient was referred here for further evaluation.   She has history of hypertension, on Cardizem, hydralazine, and metoprolol. Recently her blood pressure was on the lower side and her PCP is trying to taper off hydralazine. Today blood pressure 99/60. Questionable history of fibromyalgia. He is also on aspirin daily. Denies any alcohol smoking or using illicit drugs.   03/25/16 follow up - she has had some improvement with PT/OT. However, she still has intermittent mild HA at back of head and neck pain and tight. She sometimes has tenderness along the occipital nerve distribution. She did not start amitriptyline as she had confusion with dosing. She uses lidocaine cream PRN. She admitted that in the morning seems less symptoms and as the day goes by, she may feel more symptoms. She told me that she also  has fibromyalgia. BP today 115/75.    07/02/16 follow up - pt has been doing well. HA intermittent but improved. Currently no HA. However, on 06/19/16, she was at work, suddenly she had one sharp pain at right side of face, lasted several min, and she also started to have right arm  and right chest pain, BP up, she got up and walking out to hallway, about to pass out, her co-worker called EMS and she was sent to ED. At ED, her right facial pain gone, but started to have right dull HA, and still has some mild right arm and chest pain, EKG negative. She had MRI again demonstrated b/l corticospinal tract hyper T2 signal, unchanged. MRA neck negative but MRA head showed left supraclinoid ICA 3mm, concerning for small aneurysm. She followed with NSG Dr. Franky Macho and no intervention needed and will consider CTA head if needed. However, when comparing with her CTA head in 2014, it was unchanged. Pt came in today want to seek my opinion on that.   UPDATE Mar 28 2020: She lost follow up since Feb 2019. Followed by her primary care physician for her headaches. It has happened a lot lately.  She complains of increased headache following her second shingles vaccination in September 2021, 4-5 times a week, Tylenol provide partial help, She also complains of bilateral shoulder pain, neck tension, often spreading forward to become mild pressure headaches, She complains of difficulty sleeping, also complains of dry eyes, was seen by ophthalmologist recently, she denies focal signs.  I personally reviewed CT head with contrast in January 2018, no acute abnormality, prior right craniotomy and right aneurysm clipping  CT angiogram of the head in February 2019, no evidence of secondary aneurysm  Update September 27, 2020 SS: Headaches have always been right and left sided neck radiating forward, right now stage 3 CKD, is hesitant to try medications. She didn't take the nortriptyline, because things haven't been bad enough, careful with kidneys. In ER in August 03, 2020  For acute headache, BP was up, and creatinine was 1.31. Nothing since then. Current headaches have been tolerable, will try meditate, relax, doesn't even take medications. 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 Seeing [facility].  MRI of  the brain 08/03/20 IMPRESSION: 1. No evidence of acute intracranial abnormality. 2. Similar postsurgical changes of right paraclinoid ICA clipping.  Update November 06, 2021 SS: In ER for right sided headache yesterday with nausea, CT head showed no acute abnormality.  Couldn't get IV access, given oral benadryl and Reglan.  Her BP was up 156/90, HR 100. She was worried if her kidney disease was contributing. CBC was normal, creatinine was 1.16. Her pattern is 6 headaches a month, sore spots to touch, around her eyes. Uses meditation, ice packs to hands for acute headaches. Took Tylenol yesterday, with benefit. MRI brain with and without contrast in April 2022 was stable, Redemonstrated right paraclinoid ICA aneurysm clip. In the past tried Imitrex, couldn't sleep. Today VS are 121/77, HR 60.  Update November 13, 2022 SS: Trouble with BP with CKD. Going to have sleep study. Concerned for headache across left eye, down to left side of neck, tinnitus to left ear, pain to left occipital area. Started spironolactone, had some numbness to right hand from that. Now feels like hands could go numb, it resolves, her right hand cramped once, in the back of her mind worry about ALS. Hand symptoms better with better BP.  4 left sided headaches within the  last 10 days, takes Tylenol. Headache is left retro-orbital. She meditates until it passes, doesn't like to take medication. Has floaters at baseline due to dry eye. Headaches pattern usually jumps around her head, ultimately she is very worried about consistent left sided headache. Bright light triggers.   REVIEW OF SYSTEMS: Full 14 system review of systems performed and notable only for as above  See HPI  ALLERGIES: Allergies  Allergen Reactions   Ace Inhibitors Swelling   Azithromycin Anaphylaxis, Shortness Of Breath and Swelling    Breathing difficulty, swelling   Losartan Anaphylaxis    Patient had lip swelling   Hydralazine Other (See Comments)    Drug-induced  Lupus   Nsaids     Kidney disease   Other Swelling, Other (See Comments) and Cough    Sneezing allergy to cats/grass/dust   Spironolactone     Joint pain   Statins Other (See Comments)    Cramping, muscle pain. Tolerates Crestor     Ciprofloxacin Nausea And Vomiting    Acute kidney failure    Medrol [Methylprednisolone] Palpitations    Heart racing, increased BP   Tetracyclines & Related Palpitations    Heart racing, increased bp    HOME MEDICATIONS: Current Outpatient Medications  Medication Sig Dispense Refill   acetaminophen (TYLENOL) 650 MG CR tablet Take 650 mg by mouth 2 (two) times daily as needed for pain.     albuterol (VENTOLIN HFA) 108 (90 Base) MCG/ACT inhaler Inhale 1-2 puffs into the lungs every 4 (four) hours as needed for wheezing or shortness of breath.      aspirin EC 81 MG tablet Take 81 mg by mouth daily at 12 noon.      cloNIDine (CATAPRES - DOSED IN MG/24 HR) 0.1 mg/24hr patch Place 0.1 mg onto the skin once a week.     cloNIDine (CATAPRES) 0.1 MG tablet Take 0.1 mg by mouth as needed (htn).     DILT-XR 180 MG 24 hr capsule Take 180 mg by mouth at bedtime.     loratadine (CLARITIN) 10 MG tablet Take 10 mg by mouth daily at 12 noon.     Menthol-Methyl Salicylate (SALONPAS PAIN RELIEF PATCH EX) Apply 2-3 patches topically daily as needed (back / shoulder pain).     metoprolol succinate (TOPROL-XL) 50 MG 24 hr tablet Take 50 mg by mouth 2 (two) times daily.     Polyethyl Glycol-Propyl Glycol (SYSTANE OP) Place 1 drop into both eyes daily.     potassium chloride SA (KLOR-CON M) 20 MEQ tablet Take 20 mEq by mouth 2 (two) times daily.     rosuvastatin (CRESTOR) 10 MG tablet Take 10 mg by mouth every other day. At 12 noon     No current facility-administered medications for this visit.    PAST MEDICAL HISTORY: Past Medical History:  Diagnosis Date   Aneurysm (HCC)    brain   Asthma    Chest pain    denied on 06/18/2012   Chronic kidney disease    L - upper  pole- defect, doesn't effect      Dysrhythmia    Family history of anesthesia complication    brother & neice "flat lined" during induction: neice d/t a medication given for a blood clotting problem; brother d/t "miscalculated amount of anesthesia"   Fibromyalgia    GERD (gastroesophageal reflux disease)    h/o colitis    H. pylori infection    2012- stomach biopsy   H/O hiatal hernia  Headache(784.0)    using tylenol prn   Hyperlipidemia    Hypertension    Hypoglycemia    Normal cardiac stress test    pt. released fr. [name] care in 10/2011, all findings w/i normal limits   Overweight(278.02)    Obesity   Palpitations    Sinusitis, acute    seen by PCP- 06/17/2012, will treat /w augmentin & tussin/DM   Uterine fibroid    pt. states that she has a "closed cervix"     PAST SURGICAL HISTORY: Past Surgical History:  Procedure Laterality Date   BIOPSY  04/14/2019   Procedure: BIOPSY;  Surgeon: Malissa Hippo, MD;  Location[facility];  Service: Endoscopy;;  antrum   BIOPSY  04/24/2022   Procedure: BIOPSY;  Surgeon: Dolores Frame, MD;  Location: [facility];  Service: Gastroenterology;;   CHOLECYSTECTOMY N/A 04/28/2019   Procedure: LAPAROSCOPIC CHOLECYSTECTOMY;  Surgeon: Franky Macho, MD;  Location: [facility];  Service: General;  Laterality: N/A;   COLONOSCOPY     COLONOSCOPY N/A 11/26/2018   Procedure: COLONOSCOPY;  Surgeon: Malissa Hippo, MD;  Location: [facility];  Service: Endoscopy;  Laterality: N/A;  1:00-12:00pm   CRANIOTOMY Right 06/24/2012   Procedure: CRANIOTOMY INTRACRANIAL ANEURYSM FOR CAROTID;  Surgeon: Carmela Hurt, MD;  Location: [facility];  Service: Neurosurgery;  Laterality: Right;  RIGHT Pterional Craniotomy for aneurysm   ESOPHAGOGASTRODUODENOSCOPY N/A 04/14/2019   Procedure: ESOPHAGOGASTRODUODENOSCOPY (EGD) WITH POSSIBLE ESOPHAGEAL DILATION;  Surgeon: Malissa Hippo, MD;  [facility]: [facility];  Service: Endoscopy;   Laterality: N/A;  300pm   ESOPHAGOGASTRODUODENOSCOPY (EGD) WITH PROPOFOL N/A 04/24/2022   Procedure: ESOPHAGOGASTRODUODENOSCOPY (EGD) WITH PROPOFOL;  Surgeon: Dolores Frame, MD;  Location: [facility];  Service: Gastroenterology;  Laterality: N/A;  1245 ASA 2   LIPOMA EXCISION     buttock 10/2010    FAMILY HISTORY: Family History  Problem Relation Age of Onset   Hypertension Other    Coronary artery disease Other    Dementia Mother    Dementia Maternal Aunt     SOCIAL HISTORY: Social History   Socioeconomic History   Marital status: Single    Spouse name: Not on file   Number of children: 0   Years of education: Not on file   Highest education level: Associate degree: academic program  Occupational History   Occupation: Full time    Employer: [employer]  Tobacco Use   Smoking status: Former    Current packs/day: 0.00    Average packs/day: 0.8 packs/day for 15.2 years (12.2 ttl pk-yrs)    Types: Cigarettes    Start date: 02/13/1989    Quit date: 04/28/2004    Years since quitting: 18.5   Smokeless tobacco: Never  Vaping Use   Vaping status: Never Used  Substance and Sexual Activity   Alcohol use: No    Alcohol/week: 0.0 standard drinks of alcohol   Drug use: No   Sexual activity: Not Currently    Birth control/protection: None, Abstinence  Other Topics Concern   Not on file  Social History Narrative   Single   Social Determinants of Health   Financial Resource Strain: Not on file  Food Insecurity: Not on file  Transportation Needs: Not on file  Physical Activity: Not on file  Stress: Not on file  Social Connections: Unknown (07/05/2022)   [facility]   Social Network    Social Network: Not on file  Intimate Partner Violence: Unknown (  07/05/2022)   Received from [facility]   HITS    Physically Hurt: Not on file    Insult or Talk Down To: Not on file    Threaten Physical Harm: Not on file    Scream or Curse: Not on  file   PHYSICAL EXAM   Vitals:   11/13/22 1347  BP: 116/79  Pulse: 68  Weight: 196 lb 3.2 oz (89 kg)  Height: 5\' 3"  (1.6 m)   Not recorded     Body mass index is 34.76 kg/m.  Physical Exam  General: The patient is alert and cooperative at the time of the examination.  Skin: No significant peripheral edema is noted.  Neurologic Exam  Mental status: The patient is alert and oriented x 3 at the time of the examination. The patient has apparent normal recent and remote memory, with an apparently normal attention span and concentration ability.  Cranial nerves: Facial symmetry is present. Speech is normal, no aphasia or dysarthria is noted. Extraocular movements are full. Visual fields are full.  Motor: The patient has good strength in all 4 extremities.  Sensory examination: Soft touch sensation is symmetric on the face, arms, and legs.  Coordination: The patient has good finger-nose-finger and heel-to-shin bilaterally.  Gait and station: The patient has a normal gait.   Reflexes: Deep tendon reflexes are symmetric and normal  DIAGNOSTIC DATA (LABS, IMAGING, TESTING) - I reviewed patient records, labs, notes, testing and imaging myself where available.  Otila Kluver, DNP  [facility] [address], [address] [place], [place] [address] [phone]

## 2022-12-02 ENCOUNTER — Ambulatory Visit (INDEPENDENT_AMBULATORY_CARE_PROVIDER_SITE_OTHER): Payer: No Typology Code available for payment source | Admitting: Gastroenterology

## 2023-06-26 ENCOUNTER — Ambulatory Visit (INDEPENDENT_AMBULATORY_CARE_PROVIDER_SITE_OTHER): Payer: No Typology Code available for payment source | Admitting: Gastroenterology

## 2023-08-21 ENCOUNTER — Ambulatory Visit: Payer: Self-pay | Admitting: Neurology

## 2023-08-21 ENCOUNTER — Encounter: Payer: Self-pay | Admitting: Neurology

## 2023-10-18 ENCOUNTER — Emergency Department (HOSPITAL_COMMUNITY): Payer: Self-pay

## 2023-10-18 ENCOUNTER — Emergency Department (HOSPITAL_COMMUNITY)
Admission: EM | Admit: 2023-10-18 | Discharge: 2023-10-18 | Disposition: A | Discharge status: Home / Self Care | Payer: Self-pay | Attending: Emergency Medicine | Admitting: Emergency Medicine

## 2023-10-18 ENCOUNTER — Other Ambulatory Visit: Payer: Self-pay

## 2023-10-18 ENCOUNTER — Encounter (HOSPITAL_COMMUNITY): Payer: Self-pay

## 2023-10-18 DIAGNOSIS — Z79899 Other long term (current) drug therapy: Secondary | ICD-10-CM | POA: Insufficient documentation

## 2023-10-18 DIAGNOSIS — N1832 Chronic kidney disease, stage 3b: Secondary | ICD-10-CM | POA: Insufficient documentation

## 2023-10-18 DIAGNOSIS — I493 Ventricular premature depolarization: Secondary | ICD-10-CM | POA: Insufficient documentation

## 2023-10-18 DIAGNOSIS — I129 Hypertensive chronic kidney disease with stage 1 through stage 4 chronic kidney disease, or unspecified chronic kidney disease: Secondary | ICD-10-CM | POA: Insufficient documentation

## 2023-10-18 DIAGNOSIS — E876 Hypokalemia: Secondary | ICD-10-CM | POA: Insufficient documentation

## 2023-10-18 DIAGNOSIS — R002 Palpitations: Secondary | ICD-10-CM

## 2023-10-18 DIAGNOSIS — Z7982 Long term (current) use of aspirin: Secondary | ICD-10-CM | POA: Insufficient documentation

## 2023-10-18 LAB — URINALYSIS, ROUTINE W REFLEX MICROSCOPIC
Bacteria, UA: NONE SEEN
Bilirubin Urine: NEGATIVE
Glucose, UA: NEGATIVE mg/dL
Hgb urine dipstick: NEGATIVE
Ketones, ur: NEGATIVE mg/dL
Nitrite: NEGATIVE
Protein, ur: NEGATIVE mg/dL
Specific Gravity, Urine: 1.011 (ref 1.005–1.030)
pH: 5 (ref 5.0–8.0)

## 2023-10-18 LAB — BASIC METABOLIC PANEL WITH GFR
Anion gap: 11 (ref 5–15)
BUN: 24 mg/dL — ABNORMAL HIGH (ref 8–23)
CO2: 26 mmol/L (ref 22–32)
Calcium: 10 mg/dL (ref 8.9–10.3)
Chloride: 103 mmol/L (ref 98–111)
Creatinine, Ser: 1.53 mg/dL — ABNORMAL HIGH (ref 0.44–1.00)
GFR, Estimated: 38 mL/min — ABNORMAL LOW (ref >60)
Glucose, Bld: 100 mg/dL — ABNORMAL HIGH (ref 70–99)
Potassium: 3.2 mmol/L — ABNORMAL LOW (ref 3.5–5.1)
Sodium: 140 mmol/L (ref 135–145)

## 2023-10-18 LAB — TROPONIN I (HIGH SENSITIVITY)
Troponin I (High Sensitivity): 4 ng/L (ref <18)
Troponin I (High Sensitivity): 6 ng/L (ref <18)

## 2023-10-18 LAB — MAGNESIUM: Magnesium: 2 mg/dL (ref 1.7–2.4)

## 2023-10-18 LAB — CBC
HCT: 39.6 % (ref 36.0–46.0)
Hemoglobin: 13.3 g/dL (ref 12.0–15.0)
MCH: 30.6 pg (ref 26.0–34.0)
MCHC: 33.6 g/dL (ref 30.0–36.0)
MCV: 91.2 fL (ref 80.0–100.0)
Platelets: 330 10*3/uL (ref 150–400)
RBC: 4.34 MIL/uL (ref 3.87–5.11)
RDW: 13.1 % (ref 11.5–15.5)
WBC: 10.6 10*3/uL — ABNORMAL HIGH (ref 4.0–10.5)
nRBC: 0 % (ref 0.0–0.2)

## 2023-10-18 LAB — TSH: TSH: 2.711 u[IU]/mL (ref 0.350–4.500)

## 2023-10-18 MED ORDER — POTASSIUM CHLORIDE CRYS ER 20 MEQ PO TBCR
40.0000 meq | EXTENDED_RELEASE_TABLET | Freq: Once | ORAL | Status: AC
  Administered 2023-10-18: 40 meq via ORAL
  Filled 2023-10-18: qty 2

## 2023-10-18 MED ORDER — POTASSIUM CHLORIDE ER 20 MEQ PO TBCR
20.0000 meq | EXTENDED_RELEASE_TABLET | Freq: Every day | ORAL | 0 refills | Status: AC
Start: 2023-10-18 — End: ?

## 2023-10-18 NOTE — ED Triage Notes (Signed)
 Pt stated that she felt like she was having an irregular heart beat so she went to the UC and had an irregular EKG and was sent here.

## 2023-10-18 NOTE — Discharge Instructions (Addendum)
 Foods rich in both potassium and magnesium  include spinach, avocado, pumpkin seeds, and certain beans like lima beans. Other good sources include Swiss chard, brown rice, and some nuts and seeds.  BUN is 24.  Cr is 1.53 and GFR is 38

## 2023-10-18 NOTE — ED Provider Notes (Signed)
 [place] EMERGENCY DEPARTMENT AT [facility] Provider Note   CSN: [id number] Arrival date & time: 10/18/23  1543     Patient presents with: Chest Pain   [name] [name] is a 63 y.o. female.   Pt is a 63 yo female with pmhx significant for obesity, htn, hld, fibroids, gerd, ckd, fibromyalgia, and chronic palpitations.  Pt has not had palpitations for several months and woke up with irregular heart beats today.  Pt went to UC and had an EKG.  She said they told her that she was having a heart attack and told her that she needed to come to the ED.  She is not having any cp.  Pt has a blood pressure machine that has a sign if HR is irregular, but it does not show the rhythm.  HR in the low 60s at the time.         Prior to Admission medications   Medication Sig Start Date End Date Taking? Authorizing Provider  potassium chloride  20 MEQ TBCR Take 1 tablet (20 mEq total) by mouth daily. 10/18/23  Yes Dean Clarity, MD  acetaminophen  (TYLENOL ) 650 MG CR tablet Take 650 mg by mouth 2 (two) times daily as needed for pain.    [provider]  albuterol  (VENTOLIN  HFA) 108 (90 Base) MCG/ACT inhaler Inhale 1-2 puffs into the lungs every 4 (four) hours as needed for wheezing or shortness of breath.  06/06/15   [provider]  aspirin  EC 81 MG tablet Take 81 mg by mouth daily at 12 noon.  04/17/19   Rehman, Claudis PENNER, MD  cloNIDine (CATAPRES - DOSED IN MG/24 HR) 0.1 mg/24hr patch Place 0.1 mg onto the skin once a week.    [provider]  cloNIDine (CATAPRES) 0.1 MG tablet Take 0.1 mg by mouth as needed (htn).    [provider]  DILT-XR 180 MG 24 hr capsule Take 180 mg by mouth at bedtime. 03/18/22   [provider]  loratadine  (CLARITIN ) 10 MG tablet Take 10 mg by mouth daily at 12 noon.    [provider]  Menthol-Methyl Salicylate (SALONPAS PAIN RELIEF PATCH EX) Apply 2-3 patches topically daily as needed (back / shoulder pain).     [provider]  metoprolol  succinate (TOPROL -XL) 50 MG 24 hr tablet Take 50 mg by mouth 2 (two) times daily.    [provider]  Polyethyl Glycol-Propyl Glycol (SYSTANE OP) Place 1 drop into both eyes daily.    [provider]  potassium chloride  SA (KLOR-CON  M) 20 MEQ tablet Take 20 mEq by mouth 2 (two) times daily.    [provider]  rosuvastatin (CRESTOR) 10 MG tablet Take 10 mg by mouth every other day. At 12 noon    [provider]    Allergies: Ace inhibitors, Azithromycin, Losartan, Hydralazine , Nsaids, Other, Spironolactone , Statins, Ciprofloxacin, Medrol  [methylprednisolone ], and Tetracyclines & related    Review of Systems  Cardiovascular:  Positive for palpitations.  All other systems reviewed and are negative.   Updated Vital Signs BP (!) 112/54   Pulse 62   Temp 97.9 F (36.6 C) (Oral)   Resp 17   Ht 5' 3 (1.6 m)   Wt 84.8 kg   LMP 08/25/2012   SpO2 99%   BMI 33.13 kg/m   Physical Exam Vitals and nursing note reviewed.  Constitutional:      Appearance: She is well-developed. She is obese.  HENT:     Head: Normocephalic and  atraumatic.   Eyes:     Extraocular Movements: Extraocular movements intact.     Pupils: Pupils are equal, round, and reactive to light.    Cardiovascular:     Rate and Rhythm: Normal rate and regular rhythm.     Heart sounds: Normal heart sounds.     Comments: One PVC on monitor during my encounter with patient Pulmonary:     Effort: Pulmonary effort is normal.     Breath sounds: Normal breath sounds.  Abdominal:     General: Bowel sounds are normal.     Palpations: Abdomen is soft.   Musculoskeletal:        General: Normal range of motion.     Cervical back: Normal range of motion and neck supple.   Skin:    General: Skin is warm.     Capillary Refill: Capillary refill takes less than 2 seconds.   Neurological:     General: No focal deficit present.     Mental Status: She is  alert and oriented to person, place, and time.   Psychiatric:        Mood and Affect: Mood normal.        Behavior: Behavior normal.     (all labs ordered are listed, but only abnormal results are displayed) Labs Reviewed  BASIC METABOLIC PANEL WITH GFR - Abnormal; Notable for the following components:      Result Value   Potassium 3.2 (*)    Glucose, Bld 100 (*)    BUN 24 (*)    Creatinine, Ser 1.53 (*)    GFR, Estimated 38 (*)    All other components within normal limits  CBC - Abnormal; Notable for the following components:   WBC 10.6 (*)    All other components within normal limits  URINALYSIS, ROUTINE W REFLEX MICROSCOPIC - Abnormal; Notable for the following components:   Leukocytes,Ua MODERATE (*)    All other components within normal limits  TSH  MAGNESIUM   TROPONIN I (HIGH SENSITIVITY)  TROPONIN I (HIGH SENSITIVITY)    EKG: EKG Interpretation Date/Time:  Saturday October 18 2023 15:52:04 EDT Ventricular Rate:  70 PR Interval:  190 QRS Duration:  82 QT Interval:  442 QTC Calculation: 477 R Axis:   -17  Text Interpretation: Normal sinus rhythm Normal ECG When compared with ECG of 10-Nov-2019 19:49, PREVIOUS ECG IS PRESENT No significant change since last tracing Confirmed by Dean Clarity [phone]) on 10/18/2023 4:06:26 PM  Radiology: [facility] Chest Port 1 View Result Date: 10/18/2023 CLINICAL DATA:  Chest pain. EXAM: PORTABLE CHEST 1 VIEW COMPARISON:  11/10/2019. FINDINGS: The heart size and mediastinal contours are within normal limits. No consolidation, effusion, or pneumothorax. No acute osseous abnormality. IMPRESSION: No active disease. Electronically Signed   By: Leita Birmingham M.D.   On: 10/18/2023 17:02     Procedures   Medications Ordered in the ED  potassium chloride  SA (KLOR-CON  M) CR tablet 40 mEq (40 mEq Oral Given 10/18/23 1659)                                    Medical Decision Making Amount and/or Complexity of Data Reviewed Labs:  ordered. Radiology: ordered.  Risk Prescription drug management.   This patient presents to the ED for concern of palpitations, this involves an extensive number of treatment options, and is a complaint that carries with it a high risk of complications and morbidity.  The differential diagnosis includes pvcs, afib, svt, st   Co morbidities that complicate the patient evaluation  obesity, htn, hld, fibroids, gerd, ckd, fibromyalgia, and chronic palpitations   Additional history obtained:  Additional history obtained from epic chart review External records from outside source obtained and reviewed including family   Lab Tests:  I Ordered, and personally interpreted labs.  The pertinent results include:  cbc nl, bmp with k low at 3.2, bun 24 and cr 1.53 (stable); mg nl; tsh nl; trop nl times 2   Imaging Studies ordered:  I ordered imaging studies including cxr  I independently visualized and interpreted imaging which showed No active disease.  I agree with the radiologist interpretation   Cardiac Monitoring:  The patient was maintained on a cardiac monitor.  I personally viewed and interpreted the cardiac monitored which showed an underlying rhythm of: nsr.  1 PVC.   Medicines ordered and prescription drug management:  I ordered medication including kdur  for hypokalemia  Reevaluation of the patient after these medicines showed that the patient improved I have reviewed the patients home medicines and have made adjustments as needed   Test Considered:  ct   Critical Interventions:  monitor   Problem List / ED Course:  Palpitations:  possibly PVCs.  Pt did have a PVC on the monitor. Hypokalemia:  Kdur given.  Pt d/c with 1 week of kdur.  She is given a list of foods to increase naturally.  This could be the cause of PVCs.   Reevaluation:  After the interventions noted above, I reevaluated the patient and found that they have :improved   Social Determinants  of Health:  Lives at home   Dispostion:  After consideration of the diagnostic results and the patients response to treatment, I feel that the patent would benefit from discharge with outpatient f/u.       Final diagnoses:  Hypokalemia  Palpitations  Stage 3b chronic kidney disease (HCC)  PVC (premature ventricular contraction)    ED Discharge Orders          Ordered    potassium chloride  20 MEQ TBCR  Daily        10/18/23 1848    Ambulatory referral to Cardiology        10/18/23 AMOS Dean Clarity, MD 10/18/23 1851

## 2023-11-13 ENCOUNTER — Ambulatory Visit: Payer: Self-pay | Admitting: Neurology

## 2023-11-13 ENCOUNTER — Encounter: Payer: Self-pay | Admitting: Neurology

## 2023-11-13 VITALS — BP 135/74 | HR 61 | Resp 16

## 2023-11-13 DIAGNOSIS — R519 Headache, unspecified: Secondary | ICD-10-CM | POA: Diagnosis not present

## 2023-11-13 DIAGNOSIS — I671 Cerebral aneurysm, nonruptured: Secondary | ICD-10-CM

## 2023-11-13 DIAGNOSIS — I10 Essential (primary) hypertension: Secondary | ICD-10-CM

## 2023-11-13 DIAGNOSIS — Z8679 Personal history of other diseases of the circulatory system: Secondary | ICD-10-CM | POA: Diagnosis not present

## 2023-11-13 NOTE — Progress Notes (Signed)
 Chief Complaint  Patient presents with   Headache    RM17, ALONE, HEADACHE: pt reported 4/30 days w/ha. Trigger: low heart rate, htn, potassium. Pt stated that she has spots on her right side of head spots that are circular and sore prior to the full ha.    Hypertension    RM17, , alone, HTN: pt reported that the htn can cause ha    Ear Problem    Rm17, alone, Pt stated that the tinnitus and hearing pulse in ear can trigger her headaches as well.    Assessment and plan  1. 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 Frequent headaches with migraine features 3.  Hypertension  - Headaches under good control, mostly triggered by high blood pressure.  Working to get potassium and blood pressure under control.  Had MRI of the brain last September that did not show any acute abnormality.  MRA of the head was not done.  We ordered both last year due to new type of headache that has now resolved.  Overall, she is doing well, is overall stable.  For now, we will hold off on repeating MRI of the head unless new symptoms.  She wishes to follow-up in 6 months.  I personally spent a total of 40 minutes in the care of the patient today including preparing to see the patient, getting/reviewing separately obtained history, performing a medically appropriate exam/evaluation, counseling and educating, referring and communicating with other health care professionals, documenting clinical information in the EHR, communicating results, and coordinating care.  HISTORY [name] [name], seen in request by  I reviewed and summarized the referring note. PMH HTN,  HLD,  right ICA terminus aneurysm s/p clipping in 2013   Patient was following with Dr.penumalli in 2013 for headache. At that time, she complained of intermittent headache, right or left-sided, associated with neck pain, left shoulder pain, nausea, blurred vision, and dizziness. Headaches were proceeded by feeding of  sore spots over the scalp. She took Tylenol  for headache. However she had MRI and MRA at that time showed 45 mm terminal right ICA aneurysm. MRI C-spine showed minimal degenerative changes without significant spinal stenosis. She underwent aneurysm clipping by Dr. Lindalee and after that her headache was gone for several years.   Since 2019, her headache gradually coming back, her headache was very similar to previous headache, starting with lower neck pain, bilateral shoulder pain, up to the back of her head, either left or right-sided with neck tightness. Headaches are more pressure like, dull feeling, sometimes lasting 2-3 days. Tylenol  works if takes early. For the last 2 weeks, headache seems getting worse. At that same time she had right hand cramping after overexerting the right hand in a party, as well as sometimes left arm tingling. Went to see PCP, had a stress test which was negative and also had MRI/MRA showed previous aneurysm clipping without change, however, also showed symmetric mild signal along bilateral corticospinal tracts, progressed from 2015. It reported this finding is nonspecific however may be seen associated with ALS. Due to this abnormal findings, patient was referred here for further evaluation.   She has history of hypertension, on Cardizem , hydralazine , and metoprolol . Recently her blood pressure was on the lower side and her PCP is trying to taper off hydralazine . Today blood pressure 99/60. Questionable history of fibromyalgia. He is also on aspirin  daily. Denies any alcohol smoking or using illicit drugs.   03/25/16 follow up - she has had  some improvement with PT/OT. However, she still has intermittent mild HA at back of head and neck pain and tight. She sometimes has tenderness along the occipital nerve distribution. She did not start amitriptyline  as she had confusion with dosing. She uses lidocaine  cream PRN. She admitted that in the morning seems less symptoms and as the  day goes by, she may feel more symptoms. She told me that she also has fibromyalgia. BP today 115/75.    07/02/16 follow up - pt has been doing well. HA intermittent but improved. Currently no HA. However, on 06/19/16, she was at work, suddenly she had one sharp pain at right side of face, lasted several min, and she also started to have right arm and right chest pain, BP up, she got up and walking out to hallway, about to pass out, her co-worker called EMS and she was sent to ED. At ED, her right facial pain gone, but started to have right dull HA, and still has some mild right arm and chest pain, EKG negative. She had MRI again demonstrated b/l corticospinal tract hyper T2 signal, unchanged. MRA neck negative but MRA head showed left supraclinoid ICA 3mm, concerning for small aneurysm. She followed with NSG Dr. Gillie and no intervention needed and will consider CTA head if needed. However, when comparing with her CTA head in 2014, it was unchanged. Pt came in today want to seek my opinion on that.   UPDATE Mar 28 2020: She lost follow up since Feb 2019. Followed by her primary care physician for her headaches. It has happened a lot lately.  She complains of increased headache following her second shingles vaccination in September 2021, 4-5 times a week, Tylenol  provide partial help, She also complains of bilateral shoulder pain, neck tension, often spreading forward to become mild pressure headaches, She complains of difficulty sleeping, also complains of dry eyes, was seen by ophthalmologist recently, she denies focal signs.  I personally reviewed CT head with contrast in January 2018, no acute abnormality, prior right craniotomy and right aneurysm clipping  CT angiogram of the head in February 2019, no evidence of secondary aneurysm  Update September 27, 2020 SS: Headaches have always been right and left sided neck radiating forward, right now stage 3 CKD, is hesitant to try medications. She didn't take  the nortriptyline , because things haven't been bad enough, careful with kidneys. In ER in August 03, 2020  For acute headache, BP was up, and creatinine was 1.31. Nothing since then. Current headaches have been tolerable, will try meditate, relax, doesn't even take medications. 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 Seeing Daphne Kidney.  MRI of the brain 08/03/20 IMPRESSION: 1. No evidence of acute intracranial abnormality. 2. Similar postsurgical changes of right paraclinoid ICA clipping.  Update November 06, 2021 SS: In ER for right sided headache yesterday with nausea, CT head showed no acute abnormality.  Couldn't get IV access, given oral benadryl  and Reglan .  Her BP was up 156/90, HR 100. She was worried if her kidney disease was contributing. CBC was normal, creatinine was 1.16. Her pattern is 6 headaches a month, sore spots to touch, around her eyes. Uses meditation, ice packs to hands for acute headaches. Took Tylenol  yesterday, with benefit. MRI brain with and without contrast in April 2022 was stable, Redemonstrated right paraclinoid ICA aneurysm clip. In the past tried Imitrex, couldn't sleep. Today VS are 121/77, HR 60.  Update November 13, 2022 SS: Trouble with BP with CKD. Going  to have sleep study. Concerned for headache across left eye, down to left side of neck, tinnitus to left ear, pain to left occipital area. Started spironolactone , had some numbness to right hand from that. Now feels like hands could go numb, it resolves, her right hand cramped once, in the back of her mind worry about ALS. Hand symptoms better with better BP.  4 left sided headaches within the last 10 days, takes Tylenol . Headache is left retro-orbital. She meditates until it passes, doesn't like to take medication. Has floaters at baseline due to dry eye. Headaches pattern usually jumps around her head, ultimately she is very worried about consistent left sided headache. Bright light triggers.   Update November 13, 2023 SS:  she is retired now. Last year I ordered ESR, CRP wasn't done, also MRI brain and MRA head. [facility] susceptibility artifact in the right paraclinoid region related  to prior aneurysm clipping. MRA was not done. Potassium was low las month, had palpations, this triggered her headache. Headaches start occipital area mostly on the left, spreads forward. Severe dry eyes, can trigger. Has constant pulsing sensation in her ears, going on prior to aneurysm clipping. Headaches mostly happen with HTN. Working with cardiology to get BP controlled.   REVIEW OF SYSTEMS: Full 14 system review of systems performed and notable only for as above  See HPI  ALLERGIES: Allergies  Allergen Reactions   Ace Inhibitors Swelling   Azithromycin Anaphylaxis, Shortness Of Breath and Swelling    Breathing difficulty, swelling   Losartan Anaphylaxis    Patient had lip swelling   Hydralazine  Other (See Comments)    Drug-induced Lupus   Nsaids     Kidney disease   Other Swelling, Other (See Comments) and Cough    Sneezing allergy to cats/grass/dust   Spironolactone      Joint pain   Statins Other (See Comments)    Cramping, muscle pain. Tolerates Crestor     Ciprofloxacin Nausea And Vomiting    Acute kidney failure    Medrol  [Methylprednisolone ] Palpitations    Heart racing, increased BP   Tetracyclines & Related Palpitations    Heart racing, increased bp    HOME MEDICATIONS: Current Outpatient Medications  Medication Sig Dispense Refill   acetaminophen  (TYLENOL ) 650 MG CR tablet Take 650 mg by mouth 2 (two) times daily as needed for pain.     albuterol  (VENTOLIN  HFA) 108 (90 Base) MCG/ACT inhaler Inhale 1-2 puffs into the lungs every 4 (four) hours as needed for wheezing or shortness of breath.      aspirin  EC 81 MG tablet Take 81 mg by mouth daily at 12 noon.      cloNIDine (CATAPRES - DOSED IN MG/24 HR) 0.1 mg/24hr patch Place 0.1 mg onto the skin once a week.     DILT-XR 180 MG 24 hr capsule  Take 180 mg by mouth at bedtime.     loratadine  (CLARITIN ) 10 MG tablet Take 10 mg by mouth daily at 12 noon.     Menthol-Methyl Salicylate (SALONPAS PAIN RELIEF PATCH EX) Apply 2-3 patches topically daily as needed (back / shoulder pain).     metoprolol  succinate (TOPROL -XL) 50 MG 24 hr tablet Take 50 mg by mouth 2 (two) times daily.     Polyethyl Glycol-Propyl Glycol (SYSTANE OP) Place 1 drop into both eyes daily.     potassium chloride  20 MEQ TBCR Take 1 tablet (20 mEq total) by mouth daily. 7 tablet 0   potassium chloride   SA (KLOR-CON  M) 20 MEQ tablet Take 20 mEq by mouth 2 (two) times daily.     rosuvastatin (CRESTOR) 10 MG tablet Take 10 mg by mouth every other day. At 12 noon     No current facility-administered medications for this visit.    PAST MEDICAL HISTORY: Past Medical History:  Diagnosis Date   Aneurysm (HCC)    brain   Asthma    Chest pain    denied on 06/18/2012   Chronic kidney disease    L - upper pole- defect, doesn't effect      Dysrhythmia    Family history of anesthesia complication    brother & neice flat lined during induction: neice d/t a medication given for a blood clotting problem; brother d/t miscalculated amount of anesthesia   Fibromyalgia    GERD (gastroesophageal reflux disease)    h/o colitis    H. pylori infection    2012- stomach biopsy   H/O hiatal hernia    Headache(784.0)    using tylenol  prn   Hyperlipidemia    Hypertension    Hypoglycemia    Normal cardiac stress test    pt. released fr. Dr. Valli care in 10/2011, all findings w/i normal limits   Overweight(278.02)    Obesity   Palpitations    Sinusitis, acute    seen by PCP- 06/17/2012, will treat /w augmentin  & tussin/DM   Uterine fibroid    pt. states that she has a closed cervix     PAST SURGICAL HISTORY: Past Surgical History:  Procedure Laterality Date   BIOPSY  04/14/2019   Procedure: BIOPSY;  Surgeon: Golda Claudis PENNER, MD;  Location: [facility];  Service:  Endoscopy;;  antrum   BIOPSY  04/24/2022   Procedure: BIOPSY;  Surgeon: Eartha Angelia Sieving, MD;  Location: [facility];  Service: Gastroenterology;;   CHOLECYSTECTOMY N/A 04/28/2019   Procedure: LAPAROSCOPIC CHOLECYSTECTOMY;  Surgeon: Mavis Anes, MD;  Location: [facility];  Service: General;  Laterality: N/A;   COLONOSCOPY     COLONOSCOPY N/A 11/26/2018   Procedure: COLONOSCOPY;  Surgeon: Golda Claudis PENNER, MD;  Location: [facility];  Service: Endoscopy;  Laterality: N/A;  1:00-12:00pm   CRANIOTOMY Right 06/24/2012   Procedure: CRANIOTOMY INTRACRANIAL ANEURYSM FOR CAROTID;  Surgeon: Rockey LITTIE Peru, MD;  Location: [facility];  Service: Neurosurgery;  Laterality: Right;  RIGHT Pterional Craniotomy for aneurysm   ESOPHAGOGASTRODUODENOSCOPY N/A 04/14/2019   Procedure: ESOPHAGOGASTRODUODENOSCOPY (EGD) WITH POSSIBLE ESOPHAGEAL DILATION;  Surgeon: Golda Claudis PENNER, MD;  Location: [facility];  Service: Endoscopy;  Laterality: N/A;  300pm   ESOPHAGOGASTRODUODENOSCOPY (EGD) WITH PROPOFOL  N/A 04/24/2022   Procedure: ESOPHAGOGASTRODUODENOSCOPY (EGD) WITH PROPOFOL ;  Surgeon: Eartha Angelia Sieving, MD;  Location: [facility];  Service: Gastroenterology;  Laterality: N/A;  1245 ASA 2   LIPOMA EXCISION     buttock 10/2010    FAMILY HISTORY: Family History  Problem Relation Age of Onset   Hypertension Other    Coronary artery disease Other    Dementia Mother    Dementia Maternal Aunt     SOCIAL HISTORY: Social History   Socioeconomic History   Marital status: Single    Spouse name: Not on file   Number of children: 0   Years of education: Not on file   Highest education level: Associate degree: academic program  Occupational History   Occupation: Full time    Employer: [employer]  Tobacco Use   Smoking status: Former    Current packs/day:  0.00    Average packs/day: 0.8 packs/day for 15.2 years (12.2 ttl pk-yrs)    Types: Cigarettes    Start date: 02/13/1989     Quit date: 04/28/2004    Years since quitting: 19.5   Smokeless tobacco: Never  Vaping Use   Vaping status: Never Used  Substance and Sexual Activity   Alcohol use: No    Alcohol/week: 0.0 standard drinks of alcohol   Drug use: No   Sexual activity: Not Currently    Birth control/protection: None, Abstinence  Other Topics Concern   Not on file  Social History Narrative   Single   Social Drivers of Health   Financial Resource Strain: Not on file  Food Insecurity: Not on file  Transportation Needs: Not on file  Physical Activity: Not on file  Stress: Not on file  Social Connections: Unknown (07/05/2022)   Received from [facility]   Social Network    Social Network: Not on file  Intimate Partner Violence: Unknown (07/05/2022)   Received from [facility]   HITS    Physically Hurt: Not on file    Insult or Talk Down To: Not on file    Threaten Physical Harm: Not on file    Scream or Curse: Not on file   PHYSICAL EXAM   Vitals:   11/13/23 0907  BP: 135/74  Pulse: 61  Resp: 16  SpO2: 95%    Not recorded     There is no height or weight on file to calculate BMI.  Physical Exam  General: The patient is alert and cooperative at the time of the examination.  Skin: No significant peripheral edema is noted.  Neurologic Exam  Mental status: The patient is alert and oriented x 3 at the time of the examination. The patient has apparent normal recent and remote memory, with an apparently normal attention span and concentration ability.  Cranial nerves: Facial symmetry is present. Speech is normal, no aphasia or dysarthria is noted. Extraocular movements are full. Visual fields are full.  Motor: The patient has good strength in all 4 extremities.  Sensory examination: Soft touch sensation is symmetric on the face, arms, and legs.  Coordination: The patient has good finger-nose-finger and heel-to-shin bilaterally.  Gait and station: The patient has a normal gait.    Reflexes: Deep tendon reflexes are symmetric and normal  DIAGNOSTIC DATA (LABS, IMAGING, TESTING) - I reviewed patient records, labs, notes, testing and imaging myself where available.  Lauraine Gayland MANDES, DNP  [facility] [address] [place], [place] [address] [phone]

## 2023-11-13 NOTE — Patient Instructions (Signed)
 Great to see you today! Continue to monitor your headaches.  Please call if any worsening. Continue to work on good control of your blood pressure Follow-up in 6 months.  Thanks!!

## 2023-11-18 ENCOUNTER — Emergency Department (HOSPITAL_COMMUNITY)

## 2023-11-18 ENCOUNTER — Emergency Department (HOSPITAL_COMMUNITY)
Admission: EM | Admit: 2023-11-18 | Discharge: 2023-11-18 | Disposition: A | Discharge status: Home / Self Care | Attending: Emergency Medicine | Admitting: Emergency Medicine

## 2023-11-18 ENCOUNTER — Other Ambulatory Visit: Payer: Self-pay

## 2023-11-18 ENCOUNTER — Encounter (HOSPITAL_COMMUNITY): Payer: Self-pay | Admitting: Emergency Medicine

## 2023-11-18 ENCOUNTER — Encounter: Payer: Self-pay | Admitting: Neurology

## 2023-11-18 DIAGNOSIS — Z7982 Long term (current) use of aspirin: Secondary | ICD-10-CM | POA: Diagnosis not present

## 2023-11-18 DIAGNOSIS — R519 Headache, unspecified: Secondary | ICD-10-CM | POA: Diagnosis present

## 2023-11-18 LAB — BASIC METABOLIC PANEL WITH GFR
Anion gap: 13 (ref 5–15)
BUN: 15 mg/dL (ref 8–23)
CO2: 23 mmol/L (ref 22–32)
Calcium: 10 mg/dL (ref 8.9–10.3)
Chloride: 104 mmol/L (ref 98–111)
Creatinine, Ser: 1.2 mg/dL — ABNORMAL HIGH (ref 0.44–1.00)
GFR, Estimated: 51 mL/min — ABNORMAL LOW
Glucose, Bld: 91 mg/dL (ref 70–99)
Potassium: 3.6 mmol/L (ref 3.5–5.1)
Sodium: 140 mmol/L (ref 135–145)

## 2023-11-18 LAB — CBC WITH DIFFERENTIAL/PLATELET
Abs Immature Granulocytes: 0.07 10*3/uL (ref 0.00–0.07)
Basophils Absolute: 0.1 10*3/uL (ref 0.0–0.1)
Basophils Relative: 1 %
Eosinophils Absolute: 0.2 10*3/uL (ref 0.0–0.5)
Eosinophils Relative: 2 %
HCT: 41.1 % (ref 36.0–46.0)
Hemoglobin: 13.6 g/dL (ref 12.0–15.0)
Immature Granulocytes: 1 %
Lymphocytes Relative: 26 %
Lymphs Abs: 2.2 10*3/uL (ref 0.7–4.0)
MCH: 30.5 pg (ref 26.0–34.0)
MCHC: 33.1 g/dL (ref 30.0–36.0)
MCV: 92.2 fL (ref 80.0–100.0)
Monocytes Absolute: 0.7 10*3/uL (ref 0.1–1.0)
Monocytes Relative: 8 %
Neutro Abs: 5.3 10*3/uL (ref 1.7–7.7)
Neutrophils Relative %: 62 %
Platelets: 293 10*3/uL (ref 150–400)
RBC: 4.46 MIL/uL (ref 3.87–5.11)
RDW: 13.2 % (ref 11.5–15.5)
WBC: 8.6 10*3/uL (ref 4.0–10.5)
nRBC: 0 % (ref 0.0–0.2)

## 2023-11-18 MED ORDER — CLONIDINE HCL 0.2 MG/24HR TD PTWK
0.2000 mg | MEDICATED_PATCH | Freq: Once | TRANSDERMAL | Status: DC
Start: 2023-11-18 — End: 2023-11-18
  Filled 2023-11-18: qty 1

## 2023-11-18 MED ORDER — PROCHLORPERAZINE EDISYLATE 10 MG/2ML IJ SOLN
10.0000 mg | Freq: Once | INTRAMUSCULAR | Status: AC
  Administered 2023-11-18: 10 mg via INTRAVENOUS
  Filled 2023-11-18: qty 2

## 2023-11-18 MED ORDER — DIPHENHYDRAMINE HCL 50 MG/ML IJ SOLN
12.5000 mg | Freq: Once | INTRAMUSCULAR | Status: AC
  Administered 2023-11-18: 12.5 mg via INTRAVENOUS
  Filled 2023-11-18: qty 1

## 2023-11-18 NOTE — Discharge Instructions (Addendum)
 Follow up with your neurologist - I will message her advising her of your MRI imaging today so she can guide you further if any additional tests would be helpful.  [name] [name] has asked you call her with an update of your symptoms when you get home.

## 2023-11-18 NOTE — ED Notes (Signed)
 Patient transported to MRI

## 2023-11-18 NOTE — ED Triage Notes (Signed)
 Pt bib [facility] for a sudden and severe headache that started earlier this morning. 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 Also states she feels her heart beat in her right ear.

## 2023-11-18 NOTE — ED Provider Notes (Signed)
 [place] EMERGENCY DEPARTMENT AT [facility] Provider Note   CSN: [id number] Arrival date & time: 11/18/23  1019     Patient presents with: Headache   [name] [name] is a 63 y.o. female with a history significant for chronic intermittent headaches under the care of [facility] neurology, also history of right carotid aneurysm with surgical intervention in 2014 presenting for evaluation of sudden onset of headache in her right forehead and behind her right eye.  Symptoms started around 9 AM today, associated with nausea and 1 episode of emesis upon arrival here.  She has chronic headaches as mentioned however states they typically start in her posterior head and spreads forward.  She denies visual changes or photophobia.  She also endorses tinnitus in her right ear which has been present for about 1 year consistently.  She was actually seen by neurology last week for a routine follow-up visit with no changes to her care noted although she states her provider was recommending MRA imaging but decided to defer until December for this testing. However with todays new symptoms, pt is concerned about aneursym possibility. She denies focal weakness,  dizziness, no noticed coordination changes.    {Add pertinent medical, surgical, social history, OB history to YEP:67052} The history is provided by the patient.       Prior to Admission medications   Medication Sig Start Date End Date Taking? Authorizing Provider  acetaminophen  (TYLENOL ) 650 MG CR tablet Take 650 mg by mouth 2 (two) times daily as needed for pain.    [provider]  albuterol  (VENTOLIN  HFA) 108 (90 Base) MCG/ACT inhaler Inhale 1-2 puffs into the lungs every 4 (four) hours as needed for wheezing or shortness of breath.  06/06/15   [provider]  aspirin  EC 81 MG tablet Take 81 mg by mouth daily at 12 noon.  04/17/19   Rehman, Claudis PENNER, MD  cloNIDine  (CATAPRES  - DOSED IN MG/24 HR) 0.1 mg/24hr patch Place  0.1 mg onto the skin once a week.    [provider]  DILT-XR 180 MG 24 hr capsule Take 180 mg by mouth at bedtime. 03/18/22   [provider]  loratadine  (CLARITIN ) 10 MG tablet Take 10 mg by mouth daily at 12 noon.    [provider]  Menthol-Methyl Salicylate (SALONPAS PAIN RELIEF PATCH EX) Apply 2-3 patches topically daily as needed (back / shoulder pain).    [provider]  metoprolol  succinate (TOPROL -XL) 50 MG 24 hr tablet Take 50 mg by mouth 2 (two) times daily.    [provider]  Polyethyl Glycol-Propyl Glycol (SYSTANE OP) Place 1 drop into both eyes daily.    [provider]  potassium chloride  20 MEQ TBCR Take 1 tablet (20 mEq total) by mouth daily. 10/18/23   Dean Clarity, MD  potassium chloride  SA (KLOR-CON  M) 20 MEQ tablet Take 20 mEq by mouth 2 (two) times daily.    [provider]  rosuvastatin (CRESTOR) 10 MG tablet Take 10 mg by mouth every other day. At 12 noon    [provider]    Allergies: Ace inhibitors, Azithromycin, Losartan, Hydralazine , Nsaids, Other, Spironolactone , Statins, Ciprofloxacin, Medrol  [methylprednisolone ], and Tetracyclines & related    Review of Systems  Constitutional:  Negative for fever.  HENT:  Negative for congestion.        Tinnitus.  Eyes: Negative.   Respiratory:  Negative for chest tightness and shortness of breath.   Cardiovascular:  Negative for chest pain.  Gastrointestinal:  Negative for abdominal pain and nausea.  Genitourinary: Negative.   Musculoskeletal:  Negative for arthralgias, joint swelling and neck pain.  Skin: Negative.  Negative for rash and wound.  Neurological:  Positive for headaches. Negative for dizziness, facial asymmetry, speech difficulty, weakness, light-headedness and numbness.  Psychiatric/Behavioral: Negative.      Updated Vital Signs BP 115/63   Pulse (!) 53   Temp 98.4 F (36.9 C) (Oral)   Resp 17   Ht 5' 3 (1.6 m)   Wt 85 kg    LMP 08/25/2012   SpO2 98%   BMI 33.19 kg/m   Physical Exam Vitals and nursing note reviewed.  Constitutional:      Appearance: She is well-developed.     Comments: Uncomfortable appearing  HENT:     Head: Normocephalic and atraumatic.     Right Ear: Tympanic membrane normal.     Left Ear: Tympanic membrane normal.  Eyes:     Extraocular Movements: Extraocular movements intact.     Conjunctiva/sclera: Conjunctivae normal.     Pupils: Pupils are equal, round, and reactive to light.  Cardiovascular:     Rate and Rhythm: Normal rate and regular rhythm.     Heart sounds: Normal heart sounds.     Comments: No carotid bruit appreciated. Pulmonary:     Effort: Pulmonary effort is normal.     Breath sounds: Normal breath sounds. No wheezing.  Abdominal:     General: Bowel sounds are normal.     Palpations: Abdomen is soft.     Tenderness: There is no abdominal tenderness.  Musculoskeletal:        General: Normal range of motion.     Cervical back: Normal range of motion and neck supple.  Lymphadenopathy:     Cervical: No cervical adenopathy.  Skin:    General: Skin is warm and dry.     Findings: No rash.  Neurological:     General: No focal deficit present.     Mental Status: She is alert and oriented to person, place, and time.     GCS: GCS eye subscore is 4. GCS verbal subscore is 5. GCS motor subscore is 6.     Cranial Nerves: No cranial nerve deficit.     Sensory: No sensory deficit.     Coordination: Coordination normal.     Gait: Gait normal.     Deep Tendon Reflexes: Reflexes normal.     Comments: Normal heel-shin, normal rapid alternating movements. Cranial nerves III-XII intact.  No pronator drift.  Psychiatric:        Speech: Speech normal.        Behavior: Behavior normal.        Thought Content: Thought content normal.     (all labs ordered are listed, but only abnormal results are displayed) Labs Reviewed  BASIC METABOLIC PANEL WITH GFR - Abnormal;  Notable for the following components:      Result Value   Creatinine, Ser 1.20 (*)    GFR, Estimated 51 (*)    All other components within normal limits  CBC WITH DIFFERENTIAL/PLATELET    EKG: EKG Interpretation Date/Time:  Tuesday November 18 2023 10:38:06 EDT Ventricular Rate:  62 PR Interval:  198 QRS Duration:  101 QT Interval:  424 QTC Calculation: 431 R Axis:   10  Text Interpretation: Sinus rhythm No acute changes No significant change since last tracing Confirmed by Charlyn Sora [phone]) on 11/18/2023 11:56:51 AM  Radiology: MR ANGIO HEAD WO  CONTRAST Result Date: 11/18/2023 CLINICAL DATA:  Cerebral aneurysm screening, pulsatile tinnitus, weakness. EXAM: MRI HEAD WITHOUT CONTRAST MRA HEAD WITHOUT CONTRAST TECHNIQUE: Multiplanar, multi-echo pulse sequences of the brain and surrounding structures were acquired without intravenous contrast. Angiographic images of the Circle of Willis were acquired using MRA technique without intravenous contrast. COMPARISON:  CTA head 09/10/2017. FINDINGS: MRI HEAD FINDINGS Brain: No acute infarct. No evidence of intracranial hemorrhage. White matter is unremarkable. No edema, mass effect, or midline shift. Posterior fossa is unremarkable. Normal appearance of midline structures. The basilar cisterns are patent. No extra-axial fluid collections. Ventricles: Normal size and configuration of the ventricles. Vascular: Skull base flow voids are visualized. Susceptibility adjacent to the right M1 segment compatible with aneurysm clip. Skull and upper cervical spine: Postsurgical changes. No acute or aggressive finding. Sinuses/Orbits: Orbits are symmetric. Paranasal sinuses are clear. Other: Mastoid air cells are clear. MRA HEAD FINDINGS Anterior circulation: The intracranial internal carotid arteries are patent bilaterally. There is mild atherosclerotic irregularity of the cavernous ICAs without high-grade stenosis. 2 mm inferiorly directed outpouching along the  left supraclinoid ICA which may reflect an infundibulum versus small aneurysm. Artifact from aneurysm clip adjacent to the M1 segment of the right MCA. There is apparent narrowing of the proximal right M1 segment which may be secondary to artifact versus stenosis. Distal right MCA branches are patent. The left MCA is patent. The proximal A1 segment of the right ACA is diminutive in appearance which may be affected by artifact from the aneurysm clip. The left A1 segment is patent. A2 segments are patent bilaterally. Symmetric appearance of the visualized distal ACA branches. Posterior circulation: Intracranial vertebral arteries are patent. The basilar artery is patent. Bilateral posterior cerebral arteries are patent. Posterior communicating arteries are not well visualized. The superior cerebellar arteries are patent proximally. AICA is visualized on the right. PICA visualized on the left. Anatomic variants: None significant. IMPRESSION: No acute intracranial abnormality. Sequelae of right MCA aneurysm clipping. No evidence of residual flow related enhancement within the aneurysm. Apparent narrowing of the M1 segment right MCA is likely secondary to artifact from the aneurysm clip. Consider CTA for further evaluation. Patent intracranial arterial vasculature. 2 mm inferiorly directed outpouching along the left supraclinoid ICA, infundibulum versus small aneurysm. Electronically Signed   By: Donnice Mania M.D.   On: 11/18/2023 14:21   MR BRAIN WO CONTRAST Result Date: 11/18/2023 CLINICAL DATA:  Cerebral aneurysm screening, pulsatile tinnitus, weakness. EXAM: MRI HEAD WITHOUT CONTRAST MRA HEAD WITHOUT CONTRAST TECHNIQUE: Multiplanar, multi-echo pulse sequences of the brain and surrounding structures were acquired without intravenous contrast. Angiographic images of the Circle of Willis were acquired using MRA technique without intravenous contrast. COMPARISON:  CTA head 09/10/2017. FINDINGS: MRI HEAD FINDINGS  Brain: No acute infarct. No evidence of intracranial hemorrhage. White matter is unremarkable. No edema, mass effect, or midline shift. Posterior fossa is unremarkable. Normal appearance of midline structures. The basilar cisterns are patent. No extra-axial fluid collections. Ventricles: Normal size and configuration of the ventricles. Vascular: Skull base flow voids are visualized. Susceptibility adjacent to the right M1 segment compatible with aneurysm clip. Skull and upper cervical spine: Postsurgical changes. No acute or aggressive finding. Sinuses/Orbits: Orbits are symmetric. Paranasal sinuses are clear. Other: Mastoid air cells are clear. MRA HEAD FINDINGS Anterior circulation: The intracranial internal carotid arteries are patent bilaterally. There is mild atherosclerotic irregularity of the cavernous ICAs without high-grade stenosis. 2 mm inferiorly directed outpouching along the left supraclinoid ICA which may reflect an infundibulum versus  small aneurysm. Artifact from aneurysm clip adjacent to the M1 segment of the right MCA. There is apparent narrowing of the proximal right M1 segment which may be secondary to artifact versus stenosis. Distal right MCA branches are patent. The left MCA is patent. The proximal A1 segment of the right ACA is diminutive in appearance which may be affected by artifact from the aneurysm clip. The left A1 segment is patent. A2 segments are patent bilaterally. Symmetric appearance of the visualized distal ACA branches. Posterior circulation: Intracranial vertebral arteries are patent. The basilar artery is patent. Bilateral posterior cerebral arteries are patent. Posterior communicating arteries are not well visualized. The superior cerebellar arteries are patent proximally. AICA is visualized on the right. PICA visualized on the left. Anatomic variants: None significant. IMPRESSION: No acute intracranial abnormality. Sequelae of right MCA aneurysm clipping. No evidence of  residual flow related enhancement within the aneurysm. Apparent narrowing of the M1 segment right MCA is likely secondary to artifact from the aneurysm clip. Consider CTA for further evaluation. Patent intracranial arterial vasculature. 2 mm inferiorly directed outpouching along the left supraclinoid ICA, infundibulum versus small aneurysm. Electronically Signed   By: Donnice Mania M.D.   On: 11/18/2023 14:21    {Document cardiac monitor, telemetry assessment procedure when appropriate:32947} Procedures   Medications Ordered in the ED  cloNIDine  (CATAPRES  - Dosed in mg/24 hr) patch 0.2 mg (has no administration in time range)  prochlorperazine  (COMPAZINE ) injection 10 mg (10 mg Intravenous Given 11/18/23 1202)  diphenhydrAMINE  (BENADRYL ) injection 12.5 mg (12.5 mg Intravenous Given 11/18/23 1202)      {Click here for ABCD2, HEART and other calculators REFRESH Note before signing:1}                              Medical Decision Making Amount and/or Complexity of Data Reviewed Labs: ordered. Radiology: ordered.  Risk Prescription drug management.     {Document critical care time when appropriate  Document review of labs and clinical decision tools ie CHADS2VASC2, etc  Document your independent review of radiology images and any outside records  Document your discussion with family members, caretakers and with consultants  Document social determinants of health affecting pt's care  Document your decision making why or why not admission, treatments were needed:32947:::1}   Final diagnoses:  Acute nonintractable headache, unspecified headache type    ED Discharge Orders     None

## 2023-11-24 ENCOUNTER — Other Ambulatory Visit (HOSPITAL_COMMUNITY): Payer: Self-pay | Admitting: Family Medicine

## 2023-11-24 DIAGNOSIS — R600 Localized edema: Secondary | ICD-10-CM

## 2023-11-24 DIAGNOSIS — M79601 Pain in right arm: Secondary | ICD-10-CM

## 2023-11-25 ENCOUNTER — Encounter: Payer: Self-pay | Admitting: Cardiology

## 2023-11-25 ENCOUNTER — Ambulatory Visit: Payer: Self-pay | Attending: Cardiology | Admitting: Cardiology

## 2023-11-25 VITALS — BP 120/76 | HR 63 | Ht 63.0 in | Wt 200.8 lb

## 2023-11-25 DIAGNOSIS — I10 Essential (primary) hypertension: Secondary | ICD-10-CM | POA: Diagnosis not present

## 2023-11-25 DIAGNOSIS — Z79899 Other long term (current) drug therapy: Secondary | ICD-10-CM | POA: Diagnosis not present

## 2023-11-25 DIAGNOSIS — R002 Palpitations: Secondary | ICD-10-CM | POA: Diagnosis not present

## 2023-11-25 DIAGNOSIS — E782 Mixed hyperlipidemia: Secondary | ICD-10-CM

## 2023-11-25 MED ORDER — CHLORTHALIDONE 25 MG PO TABS: 12.5000 mg | ORAL_TABLET | Freq: Every day | ORAL | Status: AC

## 2023-11-25 NOTE — Patient Instructions (Addendum)
 Medication Instructions:   Continue all current medications.   Labwork:  BMET, Mg - orders given today Please do in 6 weeks Office will contact with results via phone, letter or mychart.     Testing/Procedures:  none  Follow-Up:  6 months   Any Other Special Instructions Will Be Listed Below (If Applicable).   If you need a refill on your cardiac medications before your next appointment, please call your pharmacy.

## 2023-11-25 NOTE — Progress Notes (Signed)
 Clinical Summary [name] is a 63 y.o.female seen today for follow up of the following medical problems.    1. Palpitations -  admit 03/2014 with palpitations and chest pain. On that day was hypertensive and tachycardic on admission. - MPI without evidence of ischemia, LVEF 78%. Echo normal LVEF. TSH 1.4. Event monitor with occasional PACs, no significant arrhythmias  - holter 11/2016 no arrhythmias    - denies symptoms recently      2. HTN - hydralazine  previously stopped due to concerns for possible drug induced lupus. Symptoms better.   - her dilt was prevoiusly decreased, with this significant elvation in bp's and she went back to takeing 180mg  bid - has been on fairly high dose dual av nodal agents due to history of chronic palpitations, has tolerated regimen.      - diltiazem  stopped during Jan 2020 admission with acute diverticulitis due to low bp's. Had also had some prior low heart rates. Chlothalidone stopped as well - pcp restarted chlorthalidione and diltiazem  for recurrent high bp's     - recent urgent care visit sbp 170s, ER visit SBP 200s - recently on losartan, did not feel well and stopped taking     - ACE and ARB allergy. AKI and hypokalemia on chlorthalidone . Concerns for drug induced lupus on hydralazine . Epic lists aldactone  as causing joint pains.   - Already on beta blocker, diltiazem  given history of palpitations - looks like clonidine  patch has been started 0.2. Also back on chlorthalidone  - compliant with meds - home bp's 120s/60 - awaiting sleep study      3. Diverticulitis - admitted Jan 2020 with abdominal pain and diarrhea, CT with evidecne of possible diverticulitis - treated with abx   4. Chest pain - long history of atypical chest pain  -  she completed a lexiscan  03/2014  that was overall low risk, small apical defect probable artifact. - seen at [facility] 10/17 with chest pain. CT PE negative.   07/2015 nuclear probable  apical thinning, cannot exlcude mild apical ischemia  - seen in ER 08/2017 with chest pain, worst with movement and cough. No evidence of ischemia by EKG or enzymes     05/31/2019 ER visit with epigastric/lower chest pain - from ER notes tender to palpation WBC 8.8 Hgb 13.4 K 3.1 Cr 1.17 hstrop 31-->26-->15 Ddimer 0.58  EKG SR no acute ishcemic changes - CT PE: no PE, no acute process - CT A/P no acute process   - recently had gallbladder removed , pain started about 1 day after - pressure epigastric and midchest, below sternum. Constant pain more than 10 hours. +tachycardia, elevated blood pressures. Pain in midback.  - has had issues with constipiation after surgery.      11/10/19 seen in ER with back pain, some radiation into chest - from ER notes symptoms resolved with GI cocktail - trop neg x 2, Ddimer neg. EKG SR, no ischeimc changes       09/2020 echo: LVEF 65-70%, no WMAs, indet diastolic function, normal RV  - chronic stable symptoms. Can be tender to palpation      5. Hyperlpidemia - labs followed by pcp - she is on crestor   SH: mom with recent stroke, currently at [facility]. Mother has come home and living with her, ongoing weakness on left side. Also has Alzheimers. Just turned 19, she is hospice.  Just retired from hospital Past Medical History:  Diagnosis Date   Aneurysm (  HCC)    brain   Asthma    Chest pain    denied on 06/18/2012   Chronic kidney disease    L - upper pole- defect, doesn't effect      Dysrhythmia    Family history of anesthesia complication    brother & neice flat lined during induction: neice d/t a medication given for a blood clotting problem; brother d/t miscalculated amount of anesthesia   Fibromyalgia    GERD (gastroesophageal reflux disease)    h/o colitis    H. pylori infection    2012- stomach biopsy   H/O hiatal hernia    Headache(784.0)    using tylenol  prn   Hyperlipidemia    Hypertension    Hypoglycemia     Normal cardiac stress test    pt. released fr. Dr. Valli care in 10/2011, all findings w/i normal limits   Overweight(278.02)    Obesity   Palpitations    Sinusitis, acute    seen by PCP- 06/17/2012, will treat /w augmentin  & tussin/DM   Uterine fibroid    pt. states that she has a closed cervix      Allergies  Allergen Reactions   Ace Inhibitors Swelling   Azithromycin Anaphylaxis, Shortness Of Breath and Swelling    Breathing difficulty, swelling   Losartan Anaphylaxis    Patient had lip swelling   Hydralazine  Other (See Comments)    Drug-induced Lupus   Nsaids     Kidney disease   Other Swelling, Other (See Comments) and Cough    Sneezing allergy to cats/grass/dust   Spironolactone      Joint pain   Statins Other (See Comments)    Cramping, muscle pain. Tolerates Crestor     Ciprofloxacin Nausea And Vomiting    Acute kidney failure    Medrol  [Methylprednisolone ] Palpitations    Heart racing, increased BP   Tetracyclines & Related Palpitations    Heart racing, increased bp     Current Outpatient Medications  Medication Sig Dispense Refill   acetaminophen  (TYLENOL ) 650 MG CR tablet Take 650 mg by mouth 2 (two) times daily as needed for pain.     albuterol  (VENTOLIN  HFA) 108 (90 Base) MCG/ACT inhaler Inhale 1-2 puffs into the lungs every 4 (four) hours as needed for wheezing or shortness of breath.      aspirin  EC 81 MG tablet Take 81 mg by mouth daily at 12 noon.      cloNIDine  (CATAPRES  - DOSED IN MG/24 HR) 0.1 mg/24hr patch Place 0.1 mg onto the skin once a week.     DILT-XR 180 MG 24 hr capsule Take 180 mg by mouth at bedtime.     loratadine  (CLARITIN ) 10 MG tablet Take 10 mg by mouth daily at 12 noon.     Menthol-Methyl Salicylate (SALONPAS PAIN RELIEF PATCH EX) Apply 2-3 patches topically daily as needed (back / shoulder pain).     metoprolol  succinate (TOPROL -XL) 50 MG 24 hr tablet Take 50 mg by mouth 2 (two) times daily.     Polyethyl Glycol-Propyl Glycol  (SYSTANE OP) Place 1 drop into both eyes daily.     potassium chloride  20 MEQ TBCR Take 1 tablet (20 mEq total) by mouth daily. 7 tablet 0   potassium chloride  SA (KLOR-CON  M) 20 MEQ tablet Take 20 mEq by mouth 2 (two) times daily.     rosuvastatin (CRESTOR) 10 MG tablet Take 10 mg by mouth every other day. At 12 noon     No current  facility-administered medications for this visit.     Past Surgical History:  Procedure Laterality Date   BIOPSY  04/14/2019   Procedure: BIOPSY;  Surgeon: Golda Claudis PENNER, MD;  [facility];  Service: Endoscopy;;  antrum   BIOPSY  04/24/2022   Procedure: BIOPSY;  Surgeon: Eartha Angelia Sieving, MD;  [facility];  Service: Gastroenterology;;   CHOLECYSTECTOMY N/A 04/28/2019   Procedure: LAPAROSCOPIC CHOLECYSTECTOMY;  Surgeon: Mavis Anes, MD;  Location: [facility];  Service: General;  Laterality: N/A;   COLONOSCOPY     COLONOSCOPY N/A 11/26/2018   Procedure: COLONOSCOPY;  Surgeon: Golda Claudis PENNER, MD;  [facility];  Service: Endoscopy;  Laterality: N/A;  1:00-12:00pm   CRANIOTOMY Right 06/24/2012   Procedure: CRANIOTOMY INTRACRANIAL ANEURYSM FOR CAROTID;  Surgeon: Rockey LITTIE Peru, MD;  [facility];  Service: Neurosurgery;  Laterality: Right;  RIGHT Pterional Craniotomy for aneurysm   ESOPHAGOGASTRODUODENOSCOPY N/A 04/14/2019   Procedure: ESOPHAGOGASTRODUODENOSCOPY (EGD) WITH POSSIBLE ESOPHAGEAL DILATION;  Surgeon: Golda Claudis PENNER, MD;  [facility];  Service: Endoscopy;  Laterality: N/A;  300pm   ESOPHAGOGASTRODUODENOSCOPY (EGD) WITH PROPOFOL  N/A 04/24/2022   Procedure: ESOPHAGOGASTRODUODENOSCOPY (EGD) WITH PROPOFOL ;  Surgeon: Eartha Angelia Sieving, MD;  [facility];  Service: Gastroenterology;  Laterality: N/A;  1245 ASA 2   LIPOMA EXCISION     buttock 10/2010     Allergies  Allergen Reactions   Ace Inhibitors Swelling   Azithromycin Anaphylaxis, Shortness Of Breath and Swelling     Breathing difficulty, swelling   Losartan Anaphylaxis    Patient had lip swelling   Hydralazine  Other (See Comments)    Drug-induced Lupus   Nsaids     Kidney disease   Other Swelling, Other (See Comments) and Cough    Sneezing allergy to cats/grass/dust   Spironolactone      Joint pain   Statins Other (See Comments)    Cramping, muscle pain. Tolerates Crestor     Ciprofloxacin Nausea And Vomiting    Acute kidney failure    Medrol  [Methylprednisolone ] Palpitations    Heart racing, increased BP   Tetracyclines & Related Palpitations    Heart racing, increased bp      Family History  Problem Relation Age of Onset   Hypertension Other    Coronary artery disease Other    Dementia Mother    Dementia Maternal Aunt      Social History [name] reports that she quit smoking about 19 years ago. Her smoking use included cigarettes. She started smoking about 34 years ago. She has a 12.2 pack-year smoking history. She has never used smokeless tobacco. Ms. Loudin reports no history of alcohol use.    Physical Examination Today's Vitals   11/25/23 1345  BP: 120/76  Pulse: 63  SpO2: 97%  Weight: 200 lb 12.8 oz (91.1 kg)  Height: 5' 3 (1.6 m)   Body mass index is 35.57 kg/m.  Gen: resting comfortably, no acute distress HEENT: no scleral icterus, pupils equal round and reactive, no palptable cervical adenopathy,  CV: RRR, no m/rg, no jvd Resp: Clear to auscultation bilaterally GI: abdomen is soft, non-tender, non-distended, normal bowel sounds, no hepatosplenomegaly MSK: extremities are warm, no edema.  Skin: warm, no rash Neuro:  no focal deficits Psych: appropriate affect   Diagnostic Studies  05/2009 Nuclear stress No ischemia   03/2014 Echo Study Conclusions  - Left ventricle: The cavity size was normal. Systolic function was   normal. Wall motion was  normal; there were no regional wall   motion abnormalities.   03/2014 Lexiscan   MPI IMPRESSION: 1. No reversible ischemia or infarction.   2. Normal left ventricular wall motion.   3. Left ventricular ejection fraction 78%   4. Low-risk stress test findings*.   03/2014 Event Monitor Symptoms correlate with NSR. One episode of fluttering showed 2 PACs. No significant arrhythmias   07/2015 Lexiscan  MPI There was no ST segment deviation noted during stress. This is a low risk study. The left ventricular ejection fraction is mildly decreased (45-54%). LVEF is 49% just slightly below normal, visually appears normal. Consider correlating with echo. There is a small mild intensity distal anterior and apical defect with mild reversibility. The apex has normal wall motion. Finding is likely secondary to apical thinning, there is also significant radiotracer uptake in the adjacent gut which may create some artifact. Cannot rule would mild apical ischemia. Overall findings are low risk.       11/2016 holter Min HR 42, Max HR 94, Avg HR 57 No supraventricular or ventricular ectopy Telemetry tracings show sinus bradycardia and sinus rhythm No symptoms reported No significant arrhythmias   Assessment and Plan   1.HTN - medical therapy has been complicated by side effects - ACE and ARB allergy. AKI and hypokalemia on chlorthalidone . Concerns for drug induced lupus on hydralazine . Already on beta blocker, diltiazem  given history of palpitations -bp's currently at goal, continue current meds. Check bmet   2. Palpitations - doing well, continue current meds  3.HLD - request pcp labs   Dorn PHEBE Ross, M.D.

## 2023-11-27 ENCOUNTER — Ambulatory Visit (HOSPITAL_COMMUNITY)

## 2023-11-27 ENCOUNTER — Encounter (HOSPITAL_COMMUNITY): Payer: Self-pay

## 2024-01-08 ENCOUNTER — Ambulatory Visit: Payer: Self-pay | Admitting: Cardiology

## 2024-01-14 ENCOUNTER — Encounter (HOSPITAL_BASED_OUTPATIENT_CLINIC_OR_DEPARTMENT_OTHER): Payer: Self-pay | Admitting: Internal Medicine

## 2024-01-14 DIAGNOSIS — G4710 Hypersomnia, unspecified: Secondary | ICD-10-CM

## 2024-01-14 DIAGNOSIS — R0683 Snoring: Secondary | ICD-10-CM

## 2024-01-14 DIAGNOSIS — R0681 Apnea, not elsewhere classified: Secondary | ICD-10-CM

## 2024-01-14 DIAGNOSIS — R5383 Other fatigue: Secondary | ICD-10-CM

## 2024-02-02 ENCOUNTER — Encounter (INDEPENDENT_AMBULATORY_CARE_PROVIDER_SITE_OTHER): Payer: Self-pay | Admitting: Gastroenterology

## 2024-02-02 ENCOUNTER — Ambulatory Visit (INDEPENDENT_AMBULATORY_CARE_PROVIDER_SITE_OTHER): Admitting: Gastroenterology

## 2024-02-02 VITALS — BP 145/93 | HR 62 | Temp 97.5°F | Ht 63.0 in | Wt 197.4 lb

## 2024-02-02 DIAGNOSIS — R079 Chest pain, unspecified: Secondary | ICD-10-CM

## 2024-02-02 MED ORDER — OMEPRAZOLE 40 MG PO CPDR: 40.0000 mg | DELAYED_RELEASE_CAPSULE | Freq: Every day | ORAL | 3 refills | Status: AC

## 2024-02-02 NOTE — Progress Notes (Signed)
 Toribio Fortune, M.D. Gastroenterology & Hepatology [facility] Gastroenterology [address] [place], [place] [address]  Primary Care Physician: Toribio Jerel MATSU, MD [address] [place] [address]  I will communicate my assessment and recommendations to the referring MD via EMR.  Problems: GERD  History of Present Illness: [name] [name] is a 63 y.o. female with past medical history of asthma, GERD, fibromyalgia, hyperlipidemia, hypertension, who presents for evaluation of chest pain.  The patient was last seen on 02/19/2022. At that time, the patient was advised to start omeprazole  40 mg daily and was scheduled for EGD with finding described below.  Patient reports that for the last 4 months she has had a raw painful feeling in the middle of her chest. Denies having typical heartburn symptoms. States the raw sensation is present only when she swallows, especially  worse with cold liquids but is present with any food intake.  States she was taking Protonix  for long term in the past. She was switched to omeprazole  by our team x2. States that it is possible her nephrologist advised her to not take a PPI as her BP starting to increase and she was undergoing testing for this. Seh was advised to stop all new medicines and she did not take the omeprazole  due to this.  Sometimes is nauseated but no vomiting. May have burping with mucus production.  The patient denies having any fever, chills, hematochezia, melena, hematemesis, abdominal distention, abdominal pain, diarrhea, jaundice, pruritus or weight loss.  Last EGD: 04/24/2022 - 1 cm hiatal hernia. - Normal stomach. Biopsied. - Normal examined duodenum. Biopsied. Biopsies were negative for H. pylori or gastric abnormalities, normal small bowel biopsies.  Last Colonoscopy:11/26/18- Diverticulosis in the entire examined colon. - External hemorrhoids. - No specimens collected.  Recommendations:  Repeat  colonoscopy in 10 years   Past Medical History: Past Medical History:  Diagnosis Date   Aneurysm    brain   Asthma    Chest pain    denied on 06/18/2012   Chronic kidney disease    L - upper pole- defect, doesn't effect      Dysrhythmia    Family history of anesthesia complication    brother & neice flat lined during induction: neice d/t a medication given for a blood clotting problem; brother d/t miscalculated amount of anesthesia   Fibromyalgia    GERD (gastroesophageal reflux disease)    h/o colitis    H. pylori infection    2012- stomach biopsy   H/O hiatal hernia    Headache(784.0)    using tylenol  prn   Hyperlipidemia    Hypertension    Hypoglycemia    Normal cardiac stress test    pt. released fr. Dr. Valli care in 10/2011, all findings w/i normal limits   Overweight(278.02)    Obesity   Palpitations    Sinusitis, acute    seen by PCP- 06/17/2012, will treat /w augmentin  & tussin/DM   Uterine fibroid    pt. states that she has a closed cervix     Past Surgical History: Past Surgical History:  Procedure Laterality Date   BIOPSY  04/14/2019   Procedure: BIOPSY;  Surgeon: Golda Claudis PENNER, MD;  [facility];  Service: Endoscopy;;  antrum   BIOPSY  04/24/2022   Procedure: BIOPSY;  Surgeon: Fortune Angelia Toribio, MD;  [facility];  Service: Gastroenterology;;   CHOLECYSTECTOMY N/A 04/28/2019   Procedure: LAPAROSCOPIC CHOLECYSTECTOMY;  Surgeon: Mavis Anes, MD;  Location: [facility];  Service: General;  Laterality: N/A;   COLONOSCOPY     COLONOSCOPY N/A 11/26/2018   Procedure: COLONOSCOPY;  Surgeon: Golda Claudis PENNER, MD;  [facility];  Service: Endoscopy;  Laterality: N/A;  1:00-12:00pm   CRANIOTOMY Right 06/24/2012   Procedure: CRANIOTOMY INTRACRANIAL ANEURYSM FOR CAROTID;  Surgeon: Rockey LITTIE Peru, MD;  [facility];  Service: Neurosurgery;  Laterality: Right;  RIGHT Pterional Craniotomy for aneurysm    ESOPHAGOGASTRODUODENOSCOPY N/A 04/14/2019   Procedure: ESOPHAGOGASTRODUODENOSCOPY (EGD) WITH POSSIBLE ESOPHAGEAL DILATION;  Surgeon: Golda Claudis PENNER, MD;  [facility];  Service: Endoscopy;  Laterality: N/A;  300pm   ESOPHAGOGASTRODUODENOSCOPY (EGD) WITH PROPOFOL  N/A 04/24/2022   Procedure: ESOPHAGOGASTRODUODENOSCOPY (EGD) WITH PROPOFOL ;  Surgeon: Eartha Angelia Sieving, MD;  [facility];  Service: Gastroenterology;  Laterality: N/A;  1245 ASA 2   LIPOMA EXCISION     buttock 10/2010    Family History: Family History  Problem Relation Age of Onset   Hypertension Other    Coronary artery disease Other    Dementia Mother    Dementia Maternal Aunt     Social History: Social History   Tobacco Use  Smoking Status Former   Current packs/day: 0.00   Average packs/day: 0.8 packs/day for 15.2 years (12.2 ttl pk-yrs)   Types: Cigarettes   Start date: 02/13/1989   Quit date: 04/28/2004   Years since quitting: 19.7  Smokeless Tobacco Never   Social History   Substance and Sexual Activity  Alcohol Use No   Alcohol/week: 0.0 standard drinks of alcohol   Social History   Substance and Sexual Activity  Drug Use No    Allergies: Allergies  Allergen Reactions   Ace Inhibitors Swelling   Azithromycin Anaphylaxis, Shortness Of Breath and Swelling    Breathing difficulty, swelling   Losartan Anaphylaxis    Patient had lip swelling   Hydralazine  Other (See Comments)    Drug-induced Lupus   Nsaids     Kidney disease   Other Swelling, Other (See Comments) and Cough    Sneezing allergy to cats/grass/dust   Spironolactone      Joint pain   Statins Other (See Comments)    Cramping, muscle pain. Tolerates Crestor     Ciprofloxacin Nausea And Vomiting    Acute kidney failure    Medrol  [Methylprednisolone ] Palpitations    Heart racing, increased BP   Tetracyclines & Related Palpitations    Heart racing, increased bp    Medications: Current Outpatient  Medications  Medication Sig Dispense Refill   acetaminophen  (TYLENOL ) 650 MG CR tablet Take 650 mg by mouth 2 (two) times daily as needed for pain.     albuterol  (VENTOLIN  HFA) 108 (90 Base) MCG/ACT inhaler Inhale 1-2 puffs into the lungs every 4 (four) hours as needed for wheezing or shortness of breath.      aspirin  EC 81 MG tablet Take 81 mg by mouth daily at 12 noon.      chlorthalidone  (HYGROTON ) 25 MG tablet Take 0.5 tablets (12.5 mg total) by mouth daily.     cloNIDine  (CATAPRES  - DOSED IN MG/24 HR) 0.1 mg/24hr patch Place 0.1 mg onto the skin once a week. (Patient taking differently: Place 0.2 mg onto the skin once a week.)     DILT-XR 180 MG 24 hr capsule Take 180 mg by mouth at bedtime.     loratadine  (CLARITIN ) 10 MG tablet Take 10 mg by mouth daily at 12 noon.  Menthol-Methyl Salicylate (SALONPAS PAIN RELIEF PATCH EX) Apply 2-3 patches topically daily as needed (back / shoulder pain).     metoprolol  succinate (TOPROL -XL) 50 MG 24 hr tablet Take 50 mg by mouth 2 (two) times daily.     Polyethyl Glycol-Propyl Glycol (SYSTANE OP) Place 1 drop into both eyes daily.     potassium chloride  20 MEQ TBCR Take 1 tablet (20 mEq total) by mouth daily. 7 tablet 0   rosuvastatin (CRESTOR) 10 MG tablet Take 10 mg by mouth every other day. At 12 noon     No current facility-administered medications for this visit.    Review of Systems: GENERAL: negative for malaise, night sweats HEENT: No changes in hearing or vision, no nose bleeds or other nasal problems. NECK: Negative for lumps, goiter, pain and significant neck swelling RESPIRATORY: Negative for cough, wheezing CARDIOVASCULAR: Negative for chest pain, leg swelling, palpitations, orthopnea GI: SEE HPI MUSCULOSKELETAL: Negative for joint pain or swelling, back pain, and muscle pain. SKIN: Negative for lesions, rash PSYCH: Negative for sleep disturbance, mood disorder and recent psychosocial stressors. HEMATOLOGY Negative for prolonged  bleeding, bruising easily, and swollen nodes. ENDOCRINE: Negative for cold or heat intolerance, polyuria, polydipsia and goiter. NEURO: negative for tremor, gait imbalance, syncope and seizures. The remainder of the review of systems is noncontributory.   Physical Exam: BP (!) 145/93 (BP Location: Left Arm, Patient Position: Sitting, Cuff Size: Large)   Pulse 62   Temp (!) 97.5 F (36.4 C) (Temporal)   Ht 5' 3 (1.6 m)   Wt 197 lb 6.4 oz (89.5 kg)   LMP 08/25/2012   BMI 34.97 kg/m  GENERAL: The patient is AO x3, in no acute distress. HEENT: Head is normocephalic and atraumatic. EOMI are intact. Mouth is well hydrated and without lesions. NECK: Supple. No masses LUNGS: Clear to auscultation. No presence of rhonchi/wheezing/rales. Adequate chest expansion HEART: RRR, normal s1 and s2. ABDOMEN: Soft, nontender, no guarding, no peritoneal signs, and nondistended. BS +. No masses. EXTREMITIES: Without any cyanosis, clubbing, rash, lesions or edema. NEUROLOGIC: AOx3, no focal motor deficit. SKIN: no jaundice, no rashes  Imaging/Labs: as above  I personally reviewed and interpreted the available labs, imaging and endoscopic files.  Impression and Plan: [name] [name] is a 63 y.o. female with past medical history of asthma, GERD, fibromyalgia, hyperlipidemia, hypertension, who presents for evaluation of chest pain.  The patient has presented recurrent issues with chest discomfort which appears to be similar to previous episodes of symptomatic GERD.  Unfortunately, she has not been taking her PPI as she was advised once to stop it prior to performing some tests and she never restarted this medication.  We discussed in detail that her symptoms appear to be related to symptomatic GERD, for which I we will prescribe her again omeprazole  to take on a regular basis.  If she were to have persistent symptoms, we will proceed with EGD to evaluate her symptoms further.  -Start omeprazole  40 mg  qday -Explained presumed etiology of reflux symptoms. Instruction provided in the use of antireflux medication - patient should take medication in the morning 30-45 minutes before eating breakfast. Discussed avoidance of eating within 2 hours of lying down to sleep and benefit of blocks to elevate head of bed. Also, will benefit from avoiding carbonated drinks/sodas or food that has tomatoes, spicy or greasy food.  All questions were answered.      Toribio Fortune, MD Gastroenterology and Hepatology [facility] Gastroenterology

## 2024-02-02 NOTE — Patient Instructions (Addendum)
 Start omeprazole  40 mg qday Explained presumed etiology of reflux symptoms. Instruction provided in the use of antireflux medication - patient should take medication in the morning 30-45 minutes before eating breakfast. Discussed avoidance of eating within 2 hours of lying down to sleep and benefit of blocks to elevate head of bed. Also, will benefit from avoiding carbonated drinks/sodas or food that has tomatoes, spicy or greasy food.

## 2024-02-02 NOTE — Progress Notes (Signed)
 SABRA

## 2024-02-07 ENCOUNTER — Emergency Department (HOSPITAL_COMMUNITY)

## 2024-02-07 ENCOUNTER — Emergency Department (HOSPITAL_COMMUNITY)
Admission: EM | Admit: 2024-02-07 | Discharge: 2024-02-07 | Disposition: A | Discharge status: Home / Self Care | Attending: Emergency Medicine | Admitting: Emergency Medicine

## 2024-02-07 ENCOUNTER — Other Ambulatory Visit: Payer: Self-pay

## 2024-02-07 ENCOUNTER — Encounter (HOSPITAL_COMMUNITY): Payer: Self-pay

## 2024-02-07 DIAGNOSIS — Z992 Dependence on renal dialysis: Secondary | ICD-10-CM | POA: Insufficient documentation

## 2024-02-07 DIAGNOSIS — W19XXXA Unspecified fall, initial encounter: Secondary | ICD-10-CM | POA: Diagnosis not present

## 2024-02-07 DIAGNOSIS — Z7982 Long term (current) use of aspirin: Secondary | ICD-10-CM | POA: Diagnosis not present

## 2024-02-07 DIAGNOSIS — I129 Hypertensive chronic kidney disease with stage 1 through stage 4 chronic kidney disease, or unspecified chronic kidney disease: Secondary | ICD-10-CM | POA: Diagnosis not present

## 2024-02-07 DIAGNOSIS — N189 Chronic kidney disease, unspecified: Secondary | ICD-10-CM | POA: Insufficient documentation

## 2024-02-07 DIAGNOSIS — M25512 Pain in left shoulder: Secondary | ICD-10-CM | POA: Diagnosis not present

## 2024-02-07 DIAGNOSIS — R202 Paresthesia of skin: Secondary | ICD-10-CM | POA: Insufficient documentation

## 2024-02-07 DIAGNOSIS — R519 Headache, unspecified: Secondary | ICD-10-CM | POA: Diagnosis present

## 2024-02-07 DIAGNOSIS — Z79899 Other long term (current) drug therapy: Secondary | ICD-10-CM | POA: Diagnosis not present

## 2024-02-07 LAB — CBC WITH DIFFERENTIAL/PLATELET
Abs Immature Granulocytes: 0.03 K/uL (ref 0.00–0.07)
Basophils Absolute: 0.1 K/uL (ref 0.0–0.1)
Basophils Relative: 1 %
Eosinophils Absolute: 0.1 K/uL (ref 0.0–0.5)
Eosinophils Relative: 1 %
HCT: 41.2 % (ref 36.0–46.0)
Hemoglobin: 13.8 g/dL (ref 12.0–15.0)
Immature Granulocytes: 0 %
Lymphocytes Relative: 26 %
Lymphs Abs: 2.3 K/uL (ref 0.7–4.0)
MCH: 29.9 pg (ref 26.0–34.0)
MCHC: 33.5 g/dL (ref 30.0–36.0)
MCV: 89.4 fL (ref 80.0–100.0)
Monocytes Absolute: 0.6 K/uL (ref 0.1–1.0)
Monocytes Relative: 7 %
Neutro Abs: 5.8 K/uL (ref 1.7–7.7)
Neutrophils Relative %: 65 %
Platelets: 349 K/uL (ref 150–400)
RBC: 4.61 MIL/uL (ref 3.87–5.11)
RDW: 13 % (ref 11.5–15.5)
WBC: 8.8 K/uL (ref 4.0–10.5)
nRBC: 0 % (ref 0.0–0.2)

## 2024-02-07 LAB — BASIC METABOLIC PANEL WITH GFR
Anion gap: 12 (ref 5–15)
BUN: 16 mg/dL (ref 8–23)
CO2: 27 mmol/L (ref 22–32)
Calcium: 10.9 mg/dL — ABNORMAL HIGH (ref 8.9–10.3)
Chloride: 102 mmol/L (ref 98–111)
Creatinine, Ser: 1.15 mg/dL — ABNORMAL HIGH (ref 0.44–1.00)
GFR, Estimated: 54 mL/min — ABNORMAL LOW (ref >60)
Glucose, Bld: 94 mg/dL (ref 70–99)
Potassium: 3.8 mmol/L (ref 3.5–5.1)
Sodium: 141 mmol/L (ref 135–145)

## 2024-02-07 MED ORDER — SODIUM CHLORIDE 0.9 % IV BOLUS
1000.0000 mL | Freq: Once | INTRAVENOUS | Status: AC
  Administered 2024-02-07: 1000 mL via INTRAVENOUS

## 2024-02-07 MED ORDER — IOHEXOL 350 MG/ML SOLN
75.0000 mL | Freq: Once | INTRAVENOUS | Status: AC | PRN
  Administered 2024-02-07: 75 mL via INTRAVENOUS

## 2024-02-07 MED ORDER — ACETAMINOPHEN 500 MG PO TABS: 1000.0000 mg | ORAL_TABLET | Freq: Once | ORAL | Status: DC

## 2024-02-07 NOTE — ED Provider Notes (Signed)
 [place] EMERGENCY DEPARTMENT AT [facility] Provider Note   CSN: [id number] Arrival date & time: 02/07/24  1319     Patient presents with: Hypertension   [name] [name] is a 63 y.o. female.  She is here today for evaluation of high blood pressure, left-sided headache and left shoulder pain.  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    She reports that she woke up this morning around 7 AM.  She noticed she had some tingling to the left side of her face that feels similar to having been hit and having some residual stinging of the skin.  She went to the bathroom and noticed that she did not have any facial drooping, she saw her tongue out to assess for tongue deviation and states there is no deviation.  She had no weakness, no trouble with her vision, no speech difficulties.  She was able to ambulate without difficulty.  She took her blood pressure and states it was 181/102 and her heart rate was 112.  She felt it was elevated because of her headache so she took her usual blood pressure medications and she rechecked her blood pressure and had decreased only slightly to the 170s systolic.   Her blood pressure had normalized but she still having residual left-sided headache behind the left eye.  She also has ongoing left shoulder pain from catching her sister who had fallen about a month ago and has intermittent popping and wants that to be evaluated as well.  Denies chest pain or shortness of breath, no fever or chills, no nausea or vomiting.  She did not have any sudden onset headache, is not worsening of life, it is such with fever or neck stiffness.  She has no tenderness of her scalp.  Denies any jaw pain or jaw claudication.  No nausea or vomiting.  No photophobia or phonophobia.    {Add pertinent medical, surgical, social history, OB history to HPI:32947}  Hypertension       Prior  to Admission medications   Medication Sig Start Date End Date Taking? Authorizing Provider  acetaminophen  (TYLENOL ) 650 MG CR tablet Take 650 mg by mouth 2 (two) times daily as needed for pain.    [provider]  albuterol  (VENTOLIN  HFA) 108 (90 Base) MCG/ACT inhaler Inhale 1-2 puffs into the lungs every 4 (four) hours as needed for wheezing or shortness of breath.  06/06/15   [provider]  aspirin  EC 81 MG tablet Take 81 mg by mouth daily at 12 noon.  04/17/19   Rehman, Claudis PENNER, MD  chlorthalidone  (HYGROTON ) 25 MG tablet Take 0.5 tablets (12.5 mg total) by mouth daily. 11/25/23   Alvan Dorn FALCON, MD  cloNIDine  (CATAPRES  - DOSED IN MG/24 HR) 0.1 mg/24hr patch Place 0.1 mg onto the skin once a week. Patient taking differently: Place 0.2 mg onto the skin once a week.    [provider]  DILT-XR 180 MG 24 hr capsule Take 180 mg by mouth at bedtime. 03/18/22   [provider]  loratadine  (CLARITIN ) 10 MG tablet Take 10 mg by mouth daily at 12 noon.    [provider]  Menthol-Methyl Salicylate (SALONPAS PAIN RELIEF PATCH EX) Apply 2-3 patches topically daily as needed (back / shoulder pain).    [provider]  metoprolol  succinate (TOPROL -XL) 50 MG 24 hr tablet Take 50 mg by mouth 2 (two) times daily.  [provider]  omeprazole  (PRILOSEC) 40 MG capsule Take 1 capsule (40 mg total) by mouth daily. 02/02/24   Castaneda Mayorga, Daniel, MD  Polyethyl Glycol-Propyl Glycol (SYSTANE OP) Place 1 drop into both eyes daily.    [provider]  potassium chloride  20 MEQ TBCR Take 1 tablet (20 mEq total) by mouth daily. 10/18/23   Haviland, Julie, MD  rosuvastatin (CRESTOR) 10 MG tablet Take 10 mg by mouth every other day. At 12 noon    [provider]    Allergies: Ace inhibitors, Azithromycin, Losartan, Hydralazine , Nsaids, Other, Spironolactone , Statins, Ciprofloxacin, Medrol  [methylprednisolone ], and Tetracyclines &  related    Review of Systems  Updated Vital Signs BP (!) 151/75 (BP Location: Right Arm)   Pulse 73   Temp 98.5 F (36.9 C) (Oral)   Resp 16   Ht 5' 3 (1.6 m)   Wt 89.4 kg   LMP 08/25/2012   SpO2 100%   BMI 34.90 kg/m   Physical Exam Vitals and nursing note reviewed.  Constitutional:      General: She is not in acute distress.    Appearance: She is well-developed.  HENT:     Head: Normocephalic and atraumatic.  Eyes:     General:        Right eye: No discharge.        Left eye: No discharge.     Extraocular Movements: Extraocular movements intact.     Conjunctiva/sclera: Conjunctivae normal.     Pupils: Pupils are equal, round, and reactive to light.  Cardiovascular:     Rate and Rhythm: Normal rate and regular rhythm.     Heart sounds: No murmur heard. Pulmonary:     Effort: Pulmonary effort is normal. No respiratory distress.     Breath sounds: Normal breath sounds.  Abdominal:     Palpations: Abdomen is soft.     Tenderness: There is no abdominal tenderness.  Musculoskeletal:        General: No swelling.     Cervical back: Neck supple.  Skin:    General: Skin is warm and dry.     Capillary Refill: Capillary refill takes less than 2 seconds.  Neurological:     General: No focal deficit present.     Mental Status: She is alert and oriented to person, place, and time.     Cranial Nerves: No cranial nerve deficit.     Sensory: No sensory deficit.     Motor: No weakness.     Coordination: Coordination normal.     Gait: Gait normal.  Psychiatric:        Mood and Affect: Mood normal.     (all labs ordered are listed, but only abnormal results are displayed) Labs Reviewed  CBC WITH DIFFERENTIAL/PLATELET  BASIC METABOLIC PANEL WITH GFR    EKG: None  Radiology: No results found.  {Document cardiac monitor, telemetry assessment procedure when appropriate:32947} Procedures   Medications Ordered in the ED - No data to display    {Click here for  ABCD2, HEART and other calculators REFRESH Note before signing:1}                              Medical Decision Making This patient presents to the ED for concern of headache with pain primarily to left frontal area and behind the left eye since this morning in conjunction with hypertension which has not resolved after taking her home medications, this  involves an extensive number of treatment options, and is a complaint that carries with it a high risk of complications and morbidity.  The differential diagnosis includes CVA, TIA, ICH, cerebral aneurysm, other   Co morbidities that complicate the patient evaluation :   HD CKD   Additional history obtained:  Additional history obtained from EMR External records from outside source obtained and reviewed including previous notes, labs, imaging   Lab Tests:  I Ordered, and personally interpreted labs.  The pertinent results include: CBC and BMP are at baseline, mildly elevated calcium.   Imaging Studies ordered:  I ordered imaging studies including *** which shows *** I independently visualized and interpreted imaging within scope of identifying emergent findings  I agree with the radiologist interpretation   Cardiac Monitoring: / EKG:  The patient was maintained on a cardiac monitor.  I personally viewed and interpreted the cardiac monitored which showed an underlying rhythm of: ***   Consultations Obtained:  I requested consultation with the ***,  and discussed lab and imaging findings as well as pertinent plan - they recommend: ***   Problem List / ED Course / Critical interventions / Medication management  *** I ordered medication including ***  for ***  Reevaluation of the patient after these medicines showed that the patient {resolved/improved/worsened:23923::improved} I have reviewed the patients home medicines and have made adjustments as needed   Social Determinants of Health:  ***   Test / Admission -  Considered:  ***    Amount and/or Complexity of Data Reviewed Labs: ordered. Radiology: ordered.  Risk OTC drugs. Prescription drug management.   ***  {Document critical care time when appropriate  Document review of labs and clinical decision tools ie CHADS2VASC2, etc  Document your independent review of radiology images and any outside records  Document your discussion with family members, caretakers and with consultants  Document social determinants of health affecting pt's care  Document your decision making why or why not admission, treatments were needed:32947:::1}   Final diagnoses:  None    ED Discharge Orders     None

## 2024-02-07 NOTE — ED Triage Notes (Signed)
 Pt to er, pt states that she woke up this am and her blood pressure was elevated.  States that it was 181/102 and her hr was 112.  States that she took her meds and has a clonidine  patch, meto and dilt.  States that she also took three baby aspirin .  States that she has been having a headache on the L side.  Pt denies weakness or confusion. States that she has a hx of an aneurism.    Pt states that she also has some L shoulder pain from catching her sister when she was falling.  States that now she has some residual pain in her L arm.

## 2024-02-07 NOTE — Discharge Instructions (Signed)
 Pleasure taking care of you today.  You are seen in the ER for evaluation of left-sided headache with high blood pressure.  Fortunately your workup was normal including a CTA of your head did not show any new aneurysm.  You have also been having pain in your left shoulder since a recent injury, your x-ray did not show any acute abnormalities.  I agree that you likely have a ligamentous injury with the popping sensation you have been experiencing and you should follow-up with orthopedics.  Come back to the ER for any new or worsening symptoms.

## 2024-02-11 ENCOUNTER — Encounter (INDEPENDENT_AMBULATORY_CARE_PROVIDER_SITE_OTHER): Payer: Self-pay | Admitting: Gastroenterology

## 2024-03-10 ENCOUNTER — Telehealth: Payer: Self-pay | Admitting: Cardiology

## 2024-03-10 NOTE — Telephone Encounter (Signed)
 Patient stated that when she woke up this morning her HR was 49. She took her BP as well and was 150/74 HR 52. Stated that her HR as been low for awhile since August. She hasn't taken her Diltiazem  due to her HR has been on the low side. Last night she also didn't take her second dose of Metoprolol . HR was running in the low 50s. Is currently taking Valacyclovir due to shingles and possible infection in her eye.She checked her HR and BP while on the phone with me. She had been up moving around and notices that it will go up some. HR 56. BP on left 168/84 and right side was 165/81 HR  59. Advised her will send over to provider for review. If symptoms worsen will need to be evaluated by ER. Patient verbalized understanding

## 2024-03-10 NOTE — Telephone Encounter (Signed)
 STAT if HR is under 50 or over 120  (normal HR is 60-100 beats per minute)  What is your heart rate? 48    Do you have a log of your heart rate readings (document readings)? States it has been in the 50's   Do you have any other symptoms? No

## 2024-03-10 NOTE — Telephone Encounter (Signed)
 Due to patient having shingles will hold off on nurse visit. Per Dr. Alvan will monitor her HR and will call back if her HR stays consistently in the 40s. Patient stated that she will be following back up with her PCP as well. Patient reports no cardiac symptoms

## 2024-03-11 ENCOUNTER — Ambulatory Visit

## 2024-04-01 ENCOUNTER — Emergency Department (HOSPITAL_COMMUNITY)
Admission: EM | Admit: 2024-04-01 | Discharge: 2024-04-01 | Disposition: A | Discharge status: Home / Self Care | Attending: Emergency Medicine | Admitting: Emergency Medicine

## 2024-04-01 ENCOUNTER — Encounter (HOSPITAL_COMMUNITY): Payer: Self-pay

## 2024-04-01 ENCOUNTER — Emergency Department (HOSPITAL_COMMUNITY)

## 2024-04-01 ENCOUNTER — Other Ambulatory Visit: Payer: Self-pay

## 2024-04-01 DIAGNOSIS — R1013 Epigastric pain: Secondary | ICD-10-CM | POA: Diagnosis present

## 2024-04-01 DIAGNOSIS — Z7982 Long term (current) use of aspirin: Secondary | ICD-10-CM | POA: Insufficient documentation

## 2024-04-01 DIAGNOSIS — N2889 Other specified disorders of kidney and ureter: Secondary | ICD-10-CM | POA: Diagnosis not present

## 2024-04-01 DIAGNOSIS — I10 Essential (primary) hypertension: Secondary | ICD-10-CM | POA: Diagnosis not present

## 2024-04-01 DIAGNOSIS — Z79899 Other long term (current) drug therapy: Secondary | ICD-10-CM | POA: Diagnosis not present

## 2024-04-01 DIAGNOSIS — R1010 Upper abdominal pain, unspecified: Secondary | ICD-10-CM

## 2024-04-01 LAB — COMPREHENSIVE METABOLIC PANEL WITH GFR
ALT: 8 U/L (ref 0–44)
AST: 19 U/L (ref 15–41)
Albumin: 4.2 g/dL (ref 3.5–5.0)
Alkaline Phosphatase: 61 U/L (ref 38–126)
Anion gap: 9 (ref 5–15)
BUN: 13 mg/dL (ref 8–23)
CO2: 28 mmol/L (ref 22–32)
Calcium: 10.2 mg/dL (ref 8.9–10.3)
Chloride: 104 mmol/L (ref 98–111)
Creatinine, Ser: 1.02 mg/dL — ABNORMAL HIGH (ref 0.44–1.00)
GFR, Estimated: >60 mL/min (ref >60)
Glucose, Bld: 96 mg/dL (ref 70–99)
Potassium: 3.6 mmol/L (ref 3.5–5.1)
Sodium: 141 mmol/L (ref 135–145)
Total Bilirubin: 0.5 mg/dL (ref 0.0–1.2)
Total Protein: 7.3 g/dL (ref 6.5–8.1)

## 2024-04-01 LAB — CBC
HCT: 38.8 % (ref 36.0–46.0)
Hemoglobin: 12.9 g/dL (ref 12.0–15.0)
MCH: 30.2 pg (ref 26.0–34.0)
MCHC: 33.2 g/dL (ref 30.0–36.0)
MCV: 90.9 fL (ref 80.0–100.0)
Platelets: 347 K/uL (ref 150–400)
RBC: 4.27 MIL/uL (ref 3.87–5.11)
RDW: 13.9 % (ref 11.5–15.5)
WBC: 8.3 K/uL (ref 4.0–10.5)
nRBC: 0 % (ref 0.0–0.2)

## 2024-04-01 LAB — URINALYSIS, ROUTINE W REFLEX MICROSCOPIC
Bilirubin Urine: NEGATIVE
Glucose, UA: NEGATIVE mg/dL
Hgb urine dipstick: NEGATIVE
Ketones, ur: NEGATIVE mg/dL
Nitrite: NEGATIVE
Protein, ur: NEGATIVE mg/dL
Specific Gravity, Urine: 1.006 (ref 1.005–1.030)
pH: 7 (ref 5.0–8.0)

## 2024-04-01 LAB — TROPONIN T, HIGH SENSITIVITY
Troponin T High Sensitivity: <15 ng/L (ref 0–19)
Troponin T High Sensitivity: <15 ng/L (ref 0–19)

## 2024-04-01 LAB — LIPASE, BLOOD: Lipase: 33 U/L (ref 11–51)

## 2024-04-01 MED ORDER — SUCRALFATE 1 G PO TABS: 1.0000 g | ORAL_TABLET | Freq: Three times a day (TID) | ORAL | 0 refills | Status: AC

## 2024-04-01 MED ORDER — SODIUM CHLORIDE 0.9 % IV BOLUS
1000.0000 mL | Freq: Once | INTRAVENOUS | Status: AC
  Administered 2024-04-01: 1000 mL via INTRAVENOUS

## 2024-04-01 MED ORDER — ONDANSETRON HCL 4 MG/2ML IJ SOLN
4.0000 mg | Freq: Once | INTRAMUSCULAR | Status: DC | PRN
  Filled 2024-04-01: qty 2

## 2024-04-01 MED ORDER — ALUM & MAG HYDROXIDE-SIMETH 200-200-20 MG/5ML PO SUSP
30.0000 mL | Freq: Once | ORAL | Status: DC
  Filled 2024-04-01: qty 30

## 2024-04-01 MED ORDER — IOHEXOL 300 MG/ML  SOLN
100.0000 mL | Freq: Once | INTRAMUSCULAR | Status: AC | PRN
  Administered 2024-04-01: 100 mL via INTRAVENOUS

## 2024-04-01 NOTE — ED Triage Notes (Signed)
 Patient BIB [facility], Patient states she has raw pain to upper abdomen that radiates to back, noted when swolloing and causing mid back spasms, some hot flashes had pains in the left breast. Noted her blood pressure has been elevated 191/96. States I took 4 baby aspirin  and metoprolol  prior to EMS arriving. And pain to left shoulder.

## 2024-04-01 NOTE — ED Provider Notes (Signed)
 [place] EMERGENCY DEPARTMENT AT [facility] Provider Note   CSN: [id number] Arrival date & time: 04/01/24  9294     Patient presents with: Abdominal Pain, Back Pain, and Hypertension   [name] [name] is a 63 y.o. female.   HPI 63 year old female presents with abdominal pain.  She states that yesterday she has been having on and off upper abdominal pain in her epigastrium.  Feels like a raw pain.  She has had some nausea but no vomiting.  She is also having some upper back pain.  The abdominal pain seems to wrap around to her left side.  No chest pain or shortness of breath.  No fevers but she has had some looser stools.  No urinary symptoms.  This morning about an hour ago she woke up and was feeling very hot and like her heart was racing.  She also had the abdominal pain again.  Checked her blood pressure and it was repeatedly in the 180-190/100 range.  She took 4 baby aspirin  and her metoprolol  and applied a clonidine  patch.  Called EMS.  The pain right now is moderate, 5/10.  The upper back/left trapezius pain is something she has dealt with before and had a workup for including CTA of the neck and MRI.  Prior to Admission medications   Medication Sig Start Date End Date Taking? Authorizing Provider  sucralfate  (CARAFATE ) 1 g tablet Take 1 tablet (1 g total) by mouth 4 (four) times daily -  with meals and at bedtime. 04/01/24  Yes Freddi Hamilton, MD  acetaminophen  (TYLENOL ) 650 MG CR tablet Take 650 mg by mouth 2 (two) times daily as needed for pain.    [provider]  albuterol  (VENTOLIN  HFA) 108 (90 Base) MCG/ACT inhaler Inhale 1-2 puffs into the lungs every 4 (four) hours as needed for wheezing or shortness of breath.  06/06/15   [provider]  aspirin  EC 81 MG tablet Take 81 mg by mouth daily at 12 noon.  04/17/19   Rehman, Claudis PENNER, MD  chlorthalidone  (HYGROTON ) 25 MG tablet Take 0.5 tablets (12.5 mg total) by mouth daily. 11/25/23   Alvan Dorn FALCON,  MD  cloNIDine  (CATAPRES  - DOSED IN MG/24 HR) 0.1 mg/24hr patch Place 0.1 mg onto the skin once a week. Patient taking differently: Place 0.2 mg onto the skin once a week.    [provider]  DILT-XR 180 MG 24 hr capsule Take 180 mg by mouth at bedtime. 03/18/22   [provider]  loratadine  (CLARITIN ) 10 MG tablet Take 10 mg by mouth daily at 12 noon.    [provider]  Menthol-Methyl Salicylate (SALONPAS PAIN RELIEF PATCH EX) Apply 2-3 patches topically daily as needed (back / shoulder pain).    [provider]  metoprolol  succinate (TOPROL -XL) 50 MG 24 hr tablet Take 50 mg by mouth 2 (two) times daily.    [provider]  omeprazole  (PRILOSEC) 40 MG capsule Take 1 capsule (40 mg total) by mouth daily. 02/02/24   Castaneda Mayorga, Daniel, MD  Polyethyl Glycol-Propyl Glycol (SYSTANE OP) Place 1 drop into both eyes daily.    [provider]  potassium chloride  20 MEQ TBCR Take 1 tablet (20 mEq total) by mouth daily. 10/18/23   Haviland, Julie, MD  rosuvastatin (CRESTOR) 10 MG tablet Take 10 mg by mouth every other day. At 12 noon    [provider]    Allergies: Ace inhibitors, Azithromycin, Losartan, Hydralazine , Nsaids, Other, Spironolactone , Statins, Ciprofloxacin,  Medrol  [methylprednisolone ], and Tetracyclines & related    Review of Systems  Constitutional:  Negative for fever.  Respiratory:  Negative for shortness of breath.   Cardiovascular:  Negative for chest pain.  Gastrointestinal:  Positive for abdominal pain, diarrhea and nausea. Negative for vomiting.  Genitourinary:  Negative for dysuria.  Musculoskeletal:  Positive for back pain.    Updated Vital Signs BP (!) 168/146 (BP Location: Right Arm)   Pulse 81   Temp 98.6 F (37 C) (Oral)   Resp 17   Ht 5' 3 (1.6 m)   Wt 89.4 kg   LMP 08/25/2012   SpO2 100%   BMI 34.91 kg/m   Physical Exam Vitals and nursing note reviewed.  Constitutional:      General: She  is not in acute distress.    Appearance: She is well-developed. She is not ill-appearing or diaphoretic.  HENT:     Head: Normocephalic and atraumatic.  Cardiovascular:     Rate and Rhythm: Normal rate and regular rhythm.     Pulses:          Radial pulses are 2+ on the right side and 2+ on the left side.     Heart sounds: Normal heart sounds.  Pulmonary:     Effort: Pulmonary effort is normal.     Breath sounds: Normal breath sounds.  Abdominal:     Palpations: Abdomen is soft.     Tenderness: There is abdominal tenderness in the epigastric area.  Musculoskeletal:       Back:  Skin:    General: Skin is warm and dry.  Neurological:     Mental Status: She is alert.     (all labs ordered are listed, but only abnormal results are displayed) Labs Reviewed  COMPREHENSIVE METABOLIC PANEL WITH GFR - Abnormal; Notable for the following components:      Result Value   Creatinine, Ser 1.02 (*)    All other components within normal limits  URINALYSIS, ROUTINE W REFLEX MICROSCOPIC - Abnormal; Notable for the following components:   Color, Urine STRAW (*)    Leukocytes,Ua SMALL (*)    Bacteria, UA RARE (*)    All other components within normal limits  LIPASE, BLOOD  CBC  TROPONIN T, HIGH SENSITIVITY  TROPONIN T, HIGH SENSITIVITY    EKG: EKG Interpretation Date/Time:  Thursday April 01 2024 07:29:15 EST Ventricular Rate:  66 PR Interval:  200 QRS Duration:  89 QT Interval:  389 QTC Calculation: 408 R Axis:   3  Text Interpretation: Sinus rhythm Low voltage, precordial leads no significant change since Oct 2025 Confirmed by Freddi Hamilton [phone]) on 04/01/2024 7:30:22 AM  Radiology: CT ABDOMEN PELVIS W CONTRAST Result Date: 04/01/2024 EXAM: CT ABDOMEN AND PELVIS WITH CONTRAST 04/01/2024 09:27:01 AM TECHNIQUE: CT of the abdomen and pelvis was performed with the administration of 100 mL of iohexol  (OMNIPAQUE ) 300 MG/ML solution. Multiplanar reformatted images are provided  for review. Automated exposure control, iterative reconstruction, and/or weight-based adjustment of the mA/kV was utilized to reduce the radiation dose to as low as reasonably achievable. COMPARISON: 05/31/2019 CLINICAL HISTORY: upper abdominal pain FINDINGS: LOWER CHEST: No acute abnormality. LIVER: The liver is unremarkable. GALLBLADDER AND BILE DUCTS: Status post cholecystectomy. No biliary ductal dilatation. SPLEEN: No acute abnormality. PANCREAS: No acute abnormality. ADRENAL GLANDS: No acute abnormality. KIDNEYS, URETERS AND BLADDER: Left kidney: 13 mm partially exophytic lesion is arising from the mid pole of the left kidney which demonstrates peripheral enhancement concerning for possible neoplasm.  Further evaluation with MRI according to renal protocol is recommended. At least partial left-sided ureteral duplication is noted. Stable cortical thinning and scarring of the upper pole of the left kidney is noted involving the upper pole moiety. Right kidney: No stones in the right kidney or ureter. No right-sided hydronephrosis. No perinephric or periureteral stranding on the right. Urinary bladder is unremarkable. GI AND BOWEL: Stomach demonstrates no acute abnormality. There is no bowel obstruction. PERITONEUM AND RETROPERITONEUM: No ascites. No free air. VASCULATURE: Aorta is normal in caliber. LYMPH NODES: No lymphadenopathy. REPRODUCTIVE ORGANS: No acute abnormality. BONES AND SOFT TISSUES: No acute osseous abnormality. No focal soft tissue abnormality. IMPRESSION: 1. 13 mm partially exophytic enhancing lesion in the mid left kidney, indeterminate renal mass. Recommend prompt renal protocol MRI or CT without and with contrast for characterization. 2. 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 3. Stable cortical thinning and scarring of the upper pole of the left kidney involving the upper pole moiety. Electronically signed by: Lynwood Seip MD 04/01/2024 09:55 AM EST RP Workstation: [id number]      Procedures   Medications Ordered in the ED  ondansetron  (ZOFRAN ) injection 4 mg (0 mg Intravenous Hold 04/01/24 0848)  alum & mag hydroxide-simeth (MAALOX/MYLANTA) 200-200-20 MG/5ML suspension 30 mL (0 mLs Oral Hold 04/01/24 0850)  sodium chloride  0.9 % bolus 1,000 mL (1,000 mLs Intravenous Bolus 04/01/24 0848)  iohexol  (OMNIPAQUE ) 300 MG/ML solution 100 mL (100 mLs Intravenous Contrast Given 04/01/24 0902)                                    Medical Decision Making Amount and/or Complexity of Data Reviewed Labs: ordered.    Details: Normal troponin x 2. Radiology: ordered and independent interpretation performed.    Details: No obstruction ECG/medicine tests: ordered and independent interpretation performed.    Details: No ischemia  Risk OTC drugs. Prescription drug management.   Patient presents with some mild upper abdominal pain.  Also had some significant hypertension but without treatment from me her blood pressure has been trending back down to a more normal level.  I doubt this was atypical ACS.  Probably more of a gastritis though she refused the Maalox.  She is feeling a lot better at this point and her vital signs are improving.  I think she is stable for discharge.  I did make her aware of the renal lesion seen in the urgent need for MRI.  Will prescribe some Carafate  as she is already on omeprazole .  Otherwise, low suspicion for PE, aortic dissection, ACS, etc.  Appears stable for discharge home with return precautions.     Final diagnoses:  Upper abdominal pain  Left kidney mass    ED Discharge Orders          Ordered    sucralfate  (CARAFATE ) 1 g tablet  3 times daily with meals & bedtime        04/01/24 1028               Freddi Hamilton, MD 04/01/24 1039

## 2024-04-01 NOTE — Discharge Instructions (Signed)
 Your CT scan did not show any acute cause for your pain but did show a lesion/mass on your kidney.  You will need an MRI as soon as possible as an outpatient to further evaluate this.  Your primary care doctor can order this.  We are prescribing Carafate  to help with your stomach pain.  Continue your other medicines as prescribed.  Follow-up closely with your primary care doctor.  Return to the ER for any new or worsening symptoms.

## 2024-04-16 ENCOUNTER — Ambulatory Visit: Admitting: Urology

## 2024-04-16 ENCOUNTER — Encounter: Payer: Self-pay | Admitting: Urology

## 2024-04-16 VITALS — BP 129/72 | HR 74

## 2024-04-16 DIAGNOSIS — N2889 Other specified disorders of kidney and ureter: Secondary | ICD-10-CM

## 2024-04-16 DIAGNOSIS — N3000 Acute cystitis without hematuria: Secondary | ICD-10-CM

## 2024-04-16 LAB — URINALYSIS, ROUTINE W REFLEX MICROSCOPIC
Bilirubin, UA: NEGATIVE
Glucose, UA: NEGATIVE
Ketones, UA: NEGATIVE
Nitrite, UA: NEGATIVE
Protein,UA: NEGATIVE
Specific Gravity, UA: 1.01 (ref 1.005–1.030)
Urobilinogen, Ur: 0.2 mg/dL (ref 0.2–1.0)
pH, UA: 6 (ref 5.0–7.5)

## 2024-04-16 LAB — MICROSCOPIC EXAMINATION

## 2024-04-16 NOTE — Patient Instructions (Signed)
 A Growth in the Kidney (Renal Mass): What to Know  A renal mass is an abnormal growth in the kidney. It may be found during an MRI, CT scan, or ultrasound that's done to check for other problems in the belly. Some renal masses are cancerous, or malignant, and can grow or spread quickly. Others are benign, which means they're not cancer. Renal masses include: Tumors. These may be either cancerous or benign. The most common cancerous tumor in adults is called renal cell carcinoma. In children, the most common type is Wilms tumor. The most common kidney tumors that aren't cancer include renal adenomas, oncocytomas, and angiomyolipomas (AML). Cysts. These are pockets of fluid that form on or in the kidney. What are the causes? Certain types of cancers, infections, or injuries can cause a renal mass. It's not always known what causes a cyst to form in or on the kidney. What are the signs or symptoms? Often, a renal mass or kidney cyst doesn't cause any signs or symptoms. How is this diagnosed? Your health care provider may suggest tests to diagnose the cause of your renal mass. These tests may include: A physical exam. Blood tests. Pee (urine) tests. Imaging tests, such as: CT scan. MRI. Ultrasound. Chest X-ray or bone scan. These may be done if a tumor is cancerous to see if the cancer has spread outside the kidney. Biopsy. This is when a small piece of tissue is removed from the renal mass for testing. How is this treated? Treatment is not always needed for a renal mass. Treatment will depend on the cause of the mass and if it's causing any problems or symptoms. For a cancerous renal mass, treatment options may include: Surgery. This is done to remove the tumor and any affected tissue. Chemotherapy. This uses medicines to kill cancer cells. Radiation. High-energy X-rays or gamma rays are used to kill cancer cells. Ablation. This uses extreme hot or cold temperature to kill the cancer  cells. Immunotherapy. Medicines are used to help the body's defense system (immune system) fight the cancer cells. Taking part in clinical trials. This involves trying new or experimental treatments to see if they're effective. Most kidney cysts don't need to be treated. Follow these instructions at home: What you need to do at home will depend on the cause of the mass. Follow the instructions that your provider gives you. In general: Take medicines only as told. If you were given antibiotics, take them as told. Do not stop taking them even if you start to feel better. Follow any instructions from your provider about what you can and can't do. Do not smoke, vape, or use nicotine or tobacco. Keep all follow-up visits. Your provider will need to check if your renal mass has changed or grown. Contact a health care provider if: You have flank pain, which is pain in your side or back. You have a fever. You have a loss of appetite. You have pain or swelling in your belly. You lose weight. Get help right away if: Your pain gets worse. There's blood in your pee. You can't pee. This information is not intended to replace advice given to you by your health care provider. Make sure you discuss any questions you have with your health care provider. Document Revised: 10/11/2022 Document Reviewed: 10/11/2022 Elsevier Patient Education  2025 [employer].

## 2024-04-16 NOTE — Progress Notes (Signed)
 "  04/16/2024 9:49 AM   [name] [name] 01-Jul-1960 [id number]  Referring provider: Rachele Gaynell RAMAN, MD [phone] [place],  [place] [address]  Renal mass   HPI: [name] is a 63yo here for evaluation of a left renal mass. She underwent CT 04/01/24 which showed a 13mm exophytic mid pole left renal mass with peripheral enhancement. She had a CT in 2021 which showed a 5mm cyst in the same area. UA today shows RBCs and WBCs.    PMH: Past Medical History:  Diagnosis Date   Aneurysm    brain   Asthma    Chest pain    denied on 06/18/2012   Chronic kidney disease    L - upper pole- defect, doesn't effect      Dysrhythmia    Family history of anesthesia complication    brother & neice flat lined during induction: neice d/t a medication given for a blood clotting problem; brother d/t miscalculated amount of anesthesia   Fibromyalgia    GERD (gastroesophageal reflux disease)    h/o colitis    H. pylori infection    2012- stomach biopsy   H/O hiatal hernia    Headache(784.0)    using tylenol  prn   Hyperlipidemia    Hypertension    Hypoglycemia    Normal cardiac stress test    pt. released fr. Dr. Valli care in 10/2011, all findings w/i normal limits   Overweight(278.02)    Obesity   Palpitations    Sinusitis, acute    seen by PCP- 06/17/2012, will treat /w augmentin  & tussin/DM   Uterine fibroid    pt. states that she has a closed cervix     Surgical History: Past Surgical History:  Procedure Laterality Date   BIOPSY  04/14/2019   Procedure: BIOPSY;  Surgeon: Golda Claudis PENNER, MD;  Location: [facility];  Service: Endoscopy;;  antrum   BIOPSY  04/24/2022   Procedure: BIOPSY;  Surgeon: Eartha Angelia Sieving, MD;  Location: [facility];  Service: Gastroenterology;;   CHOLECYSTECTOMY N/A 04/28/2019   Procedure: LAPAROSCOPIC CHOLECYSTECTOMY;  Surgeon: Mavis Anes, MD;  Location: [facility];  Service: General;  Laterality: N/A;   COLONOSCOPY      COLONOSCOPY N/A 11/26/2018   Procedure: COLONOSCOPY;  Surgeon: Golda Claudis PENNER, MD;  Location: [facility];  Service: Endoscopy;  Laterality: N/A;  1:00-12:00pm   CRANIOTOMY Right 06/24/2012   Procedure: CRANIOTOMY INTRACRANIAL ANEURYSM FOR CAROTID;  Surgeon: Rockey LITTIE Peru, MD;  Location: [facility];  Service: Neurosurgery;  Laterality: Right;  RIGHT Pterional Craniotomy for aneurysm   ESOPHAGOGASTRODUODENOSCOPY N/A 04/14/2019   Procedure: ESOPHAGOGASTRODUODENOSCOPY (EGD) WITH POSSIBLE ESOPHAGEAL DILATION;  Surgeon: Golda Claudis PENNER, MD;  Location: [facility];  Service: Endoscopy;  Laterality: N/A;  300pm   ESOPHAGOGASTRODUODENOSCOPY (EGD) WITH PROPOFOL  N/A 04/24/2022   Procedure: ESOPHAGOGASTRODUODENOSCOPY (EGD) WITH PROPOFOL ;  Surgeon: Eartha Angelia Sieving, MD;  Location: [facility];  Service: Gastroenterology;  Laterality: N/A;  1245 ASA 2   LIPOMA EXCISION     buttock 10/2010    Home Medications:  Allergies as of 04/16/2024       Reactions   Ace Inhibitors Swelling   Azithromycin Anaphylaxis, Shortness Of Breath, Swelling   Breathing difficulty, swelling   Losartan Anaphylaxis   Patient had lip swelling   Hydralazine  Other (See Comments)   Drug-induced Lupus   Nsaids    Kidney disease   Other Swelling, Other (See Comments), Cough   Sneezing allergy to cats/grass/dust  Spironolactone     Joint pain   Statins Other (See Comments)   Cramping, muscle pain. Tolerates Crestor    Ciprofloxacin Nausea And Vomiting   Acute kidney failure    Medrol  [methylprednisolone ] Palpitations   Heart racing, increased BP   Tetracyclines & Related Palpitations   Heart racing, increased bp        Medication List        Accurate as of April 16, 2024  9:49 AM. If you have any questions, ask your nurse or doctor.          acetaminophen  650 MG CR tablet Commonly known as: TYLENOL  Take 650 mg by mouth 2 (two) times daily as needed for pain.   albuterol  108 (90 Base)  MCG/ACT inhaler Commonly known as: VENTOLIN  HFA Inhale 1-2 puffs into the lungs every 4 (four) hours as needed for wheezing or shortness of breath.   aspirin  EC 81 MG tablet Take 81 mg by mouth daily at 12 noon.   chlorthalidone  25 MG tablet Commonly known as: HYGROTON  Take 0.5 tablets (12.5 mg total) by mouth daily.   cloNIDine  0.1 mg/24hr patch Commonly known as: CATAPRES  - Dosed in mg/24 hr Place 0.1 mg onto the skin once a week. What changed: how much to take   Dilt-XR 180 MG 24 hr capsule Generic drug: diltiazem  Take 180 mg by mouth at bedtime.   loratadine  10 MG tablet Commonly known as: CLARITIN  Take 10 mg by mouth daily at 12 noon.   metoprolol  succinate 50 MG 24 hr tablet Commonly known as: TOPROL -XL Take 50 mg by mouth 2 (two) times daily.   omeprazole  40 MG capsule Commonly known as: PRILOSEC Take 1 capsule (40 mg total) by mouth daily.   Potassium Chloride  ER 20 MEQ Tbcr Take 1 tablet (20 mEq total) by mouth daily.   rosuvastatin 10 MG tablet Commonly known as: CRESTOR Take 10 mg by mouth every other day. At 12 noon   SALONPAS PAIN RELIEF PATCH EX Apply 2-3 patches topically daily as needed (back / shoulder pain).   sucralfate  1 g tablet Commonly known as: Carafate  Take 1 tablet (1 g total) by mouth 4 (four) times daily -  with meals and at bedtime.   SYSTANE OP Place 1 drop into both eyes daily.        Allergies: Allergies[1]  Family History: Family History  Problem Relation Age of Onset   Dementia Mother    Colon cancer Brother    Dementia Maternal Aunt    Hypertension Other    Coronary artery disease Other     Social History:  reports that she quit smoking about 19 years ago. Her smoking use included cigarettes. She started smoking about 35 years ago. She has a 12.2 pack-year smoking history. She has never used smokeless tobacco. She reports that she does not drink alcohol and does not use drugs.  ROS: All other review of systems were  reviewed and are negative except what is noted above in HPI  Physical Exam: BP 129/72   Pulse 74   LMP 08/25/2012   Constitutional:  Alert and oriented, No acute distress. HEENT: [place] AT, moist mucus membranes.  Trachea midline, no masses. Cardiovascular: No clubbing, cyanosis, or edema. Respiratory: Normal respiratory effort, no increased work of breathing. GI: Abdomen is soft, nontender, nondistended, no abdominal masses GU: No CVA tenderness.  Lymph: No cervical or inguinal lymphadenopathy. Skin: No rashes, bruises or suspicious lesions. Neurologic: Grossly intact, no focal deficits, moving all 4 extremities.  Psychiatric: Normal mood and affect.  Laboratory Data: Lab Results  Component Value Date   WBC 8.3 04/01/2024   HGB 12.9 04/01/2024   HCT 38.8 04/01/2024   MCV 90.9 04/01/2024   PLT 347 04/01/2024    Lab Results  Component Value Date   CREATININE 1.02 (H) 04/01/2024    No results found for: PSA  No results found for: TESTOSTERONE  Lab Results  Component Value Date   HGBA1C 6.1 (H) 04/03/2014    Urinalysis    Component Value Date/Time   COLORURINE STRAW (A) 04/01/2024 0724   APPEARANCEUR CLEAR 04/01/2024 0724   APPEARANCEUR Clear 03/12/2016 1619   LABSPEC 1.006 04/01/2024 0724   PHURINE 7.0 04/01/2024 0724   GLUCOSEU NEGATIVE 04/01/2024 0724   HGBUR NEGATIVE 04/01/2024 0724   BILIRUBINUR NEGATIVE 04/01/2024 0724   BILIRUBINUR Negative 03/12/2016 1619   KETONESUR NEGATIVE 04/01/2024 0724   PROTEINUR NEGATIVE 04/01/2024 0724   UROBILINOGEN 0.2 08/30/2012 1339   NITRITE NEGATIVE 04/01/2024 0724   LEUKOCYTESUR SMALL (A) 04/01/2024 0724    Lab Results  Component Value Date   LABMICR See below: 03/12/2016   WBCUA 6-10 (A) 03/12/2016   RBCUA 0-2 03/12/2016   LABEPIT >10 (A) 03/12/2016   BACTERIA RARE (A) 04/01/2024    Pertinent Imaging: CT 04/01/2024: Images reviewed and discussed with the patient  No results found for this or any previous  visit.  No results found for this or any previous visit.  No results found for this or any previous visit.  No results found for this or any previous visit.  Results for orders placed during the hospital encounter of 09/26/20  US  RENAL  Narrative CLINICAL DATA:  Stage III CKD  EXAM: RENAL / URINARY TRACT ULTRASOUND COMPLETE  COMPARISON:  CT May 31, 2019 and renal ultrasound August 19, 2012  FINDINGS: Right Kidney:  Renal measurements: 9.9 x 5.3 x 4.3 cm = volume: 118 mL. Echogenicity is increased with cortical thinning. No mass or hydronephrosis visualized.  Left Kidney:  Renal measurements: 8.9 x 5.0 x 4.9 cm = volume: 115 mL. Echogenicity is increased with cortical thinning. Duplicated renal collecting system. No mass or hydronephrosis visualized.  Bladder:  Appears normal for degree of bladder distention.  Other:  Bilateral ureteral jets visualized.  IMPRESSION: 1. No hydronephrosis. 2. Increased echogenicity and cortical thinning of both kidneys consistent with chronic medical renal disease.   Electronically Signed By: Reyes Holder MD On: 09/26/2020 16:53  No results found for this or any previous visit.  No results found for this or any previous visit.  No results found for this or any previous visit.   Assessment & Plan:    1. Renal mass (Primary) We discussed the natural hx of renal masses and the 80/20 malignant/benign likelihood. We disucssed the treatment options including active surveillance. Renal ablation, partial and radical nephrectomy. We will obtain MRI abd and call with results - Urinalysis, Routine w reflex microscopic; Future - Urinalysis, Routine w reflex microscopic   No follow-ups on file.  Belvie Clara, MD  [facility]      [1]  Allergies Allergen Reactions   Ace Inhibitors Swelling   Azithromycin Anaphylaxis, Shortness Of Breath and Swelling    Breathing difficulty, swelling   Losartan  Anaphylaxis    Patient had lip swelling   Hydralazine  Other (See Comments)    Drug-induced Lupus   Nsaids     Kidney disease   Other Swelling, Other (See Comments) and Cough  Sneezing allergy to cats/grass/dust   Spironolactone      Joint pain   Statins Other (See Comments)    Cramping, muscle pain. Tolerates Crestor     Ciprofloxacin Nausea And Vomiting    Acute kidney failure    Medrol  [Methylprednisolone ] Palpitations    Heart racing, increased BP   Tetracyclines & Related Palpitations    Heart racing, increased bp   "

## 2024-04-18 LAB — URINE CULTURE

## 2024-04-19 ENCOUNTER — Ambulatory Visit: Payer: Self-pay

## 2024-04-23 ENCOUNTER — Ambulatory Visit (HOSPITAL_COMMUNITY)

## 2024-04-26 ENCOUNTER — Ambulatory Visit (HOSPITAL_COMMUNITY)

## 2024-04-26 ENCOUNTER — Encounter: Payer: Self-pay | Admitting: Cardiology

## 2024-04-28 ENCOUNTER — Ambulatory Visit (HOSPITAL_COMMUNITY)
Admission: RE | Admit: 2024-04-28 | Discharge: 2024-04-28 | Disposition: A | Discharge status: Home / Self Care | Source: Ambulatory Visit | Attending: Urology | Admitting: Urology

## 2024-04-28 ENCOUNTER — Other Ambulatory Visit: Payer: Self-pay

## 2024-04-28 ENCOUNTER — Other Ambulatory Visit: Payer: Self-pay | Admitting: Urology

## 2024-04-28 ENCOUNTER — Telehealth: Payer: Self-pay

## 2024-04-28 DIAGNOSIS — N2889 Other specified disorders of kidney and ureter: Secondary | ICD-10-CM | POA: Insufficient documentation

## 2024-04-28 MED ORDER — METOPROLOL SUCCINATE ER 25 MG PO TB24: 25.0000 mg | ORAL_TABLET | Freq: Every day | ORAL | 3 refills | Status: AC

## 2024-04-28 MED ORDER — GADOBUTROL 1 MMOL/ML IV SOLN
10.0000 mL | Freq: Once | INTRAVENOUS | Status: AC | PRN
  Administered 2024-04-28: 10 mL via INTRAVENOUS

## 2024-04-28 NOTE — Addendum Note (Signed)
 Addended by: ANN VELERIA SAUNDERS on: 04/28/2024 09:57 AM   Modules accepted: Orders

## 2024-04-28 NOTE — Telephone Encounter (Signed)
 Patient called in about MRI and she was told from Radiology. Pt was told she will have her MRI done Friday.  Pt is made aware that I will call to get MRI scheduled and call her back with appointment time and date.  Called centralized scheduling, spoke to [place]. Called pt back with appointment time and made her aware to be NPO. Pt voiced understanding

## 2024-04-28 NOTE — Telephone Encounter (Signed)
 Prescirbed toprol  50mg  twice daily. If low heart rates taking just 50mg  once daily I would lower that to 25mg  once daily. Heart rates in 50s would be ok, I would agree 40s at times is getting too low. Please update us  next week on her heart rates.   JINNY Ross MD

## 2024-05-04 NOTE — Telephone Encounter (Signed)
 Pt is made aware MRI has been routed to MD, McKenzie. Pt voiced understanding

## 2024-05-07 ENCOUNTER — Telehealth: Payer: Self-pay

## 2024-05-07 ENCOUNTER — Ambulatory Visit: Payer: Self-pay

## 2024-05-07 NOTE — Telephone Encounter (Signed)
 Called patient to give MRI results per MD McKenzie, patient has a 1 cm renal mass with a 80% chance of it being cancer, she can either watch it for now which is recommended or we can partially remove her kidney. Patient stated she will more than likely want to remove it but will let us  know on Monday.

## 2024-05-20 ENCOUNTER — Encounter (INDEPENDENT_AMBULATORY_CARE_PROVIDER_SITE_OTHER): Payer: Self-pay | Admitting: Gastroenterology

## 2024-06-09 ENCOUNTER — Ambulatory Visit: Admitting: Neurology

## 2024-06-18 ENCOUNTER — Ambulatory Visit: Admitting: Urology

## 2024-10-27 ENCOUNTER — Ambulatory Visit: Admitting: Urology
